# Patient Record
Sex: Female | Born: 1950
Health system: Southern US, Community
[De-identification: ages and names within clinical notes are randomized; demographics above are authoritative.]

## PROBLEM LIST (undated history)

## (undated) DIAGNOSIS — E039 Hypothyroidism, unspecified: Secondary | ICD-10-CM

## (undated) DIAGNOSIS — M549 Dorsalgia, unspecified: Secondary | ICD-10-CM

## (undated) DIAGNOSIS — F329 Major depressive disorder, single episode, unspecified: Secondary | ICD-10-CM

## (undated) DIAGNOSIS — F32A Depression, unspecified: Secondary | ICD-10-CM

## (undated) DIAGNOSIS — K219 Gastro-esophageal reflux disease without esophagitis: Secondary | ICD-10-CM

## (undated) DIAGNOSIS — C801 Malignant (primary) neoplasm, unspecified: Secondary | ICD-10-CM

## (undated) DIAGNOSIS — G8929 Other chronic pain: Secondary | ICD-10-CM

## (undated) DIAGNOSIS — N200 Calculus of kidney: Secondary | ICD-10-CM

## (undated) DIAGNOSIS — M1990 Unspecified osteoarthritis, unspecified site: Secondary | ICD-10-CM

## (undated) DIAGNOSIS — E785 Hyperlipidemia, unspecified: Secondary | ICD-10-CM

## (undated) DIAGNOSIS — N393 Stress incontinence (female) (male): Secondary | ICD-10-CM

## (undated) DIAGNOSIS — E079 Disorder of thyroid, unspecified: Secondary | ICD-10-CM

## (undated) DIAGNOSIS — E559 Vitamin D deficiency, unspecified: Secondary | ICD-10-CM

## (undated) DIAGNOSIS — K429 Umbilical hernia without obstruction or gangrene: Secondary | ICD-10-CM

## (undated) DIAGNOSIS — D649 Anemia, unspecified: Secondary | ICD-10-CM

## (undated) DIAGNOSIS — F419 Anxiety disorder, unspecified: Secondary | ICD-10-CM

## (undated) DIAGNOSIS — M26609 Unspecified temporomandibular joint disorder, unspecified side: Secondary | ICD-10-CM

## (undated) HISTORY — DX: Vitamin D deficiency, unspecified: E55.9

## (undated) HISTORY — PX: SPINE SURGERY: SHX786

## (undated) HISTORY — DX: Other chronic pain: G89.29

## (undated) HISTORY — DX: Dorsalgia, unspecified: M54.9

## (undated) HISTORY — DX: Anemia, unspecified: D64.9

## (undated) HISTORY — PX: HEMORRHOID BANDING: SHX5850

## (undated) HISTORY — PX: LAPAROSCOPIC LYSIS INTESTINAL ADHESIONS: SUR778

## (undated) HISTORY — DX: Hyperlipidemia, unspecified: E78.5

## (undated) HISTORY — DX: Gastro-esophageal reflux disease without esophagitis: K21.9

## (undated) HISTORY — DX: Disorder of thyroid, unspecified: E07.9

## (undated) HISTORY — DX: Calculus of kidney: N20.0

## (undated) HISTORY — PX: TONSILLECTOMY AND ADENOIDECTOMY: SUR1326

## (undated) HISTORY — PX: ABDOMINAL HYSTERECTOMY: SHX81

## (undated) HISTORY — DX: Anxiety disorder, unspecified: F41.9

## (undated) HISTORY — PX: APPENDECTOMY: SHX54

## (undated) HISTORY — PX: HERNIA REPAIR: SHX51

---

## 1998-03-04 ENCOUNTER — Other Ambulatory Visit: Admission: RE | Admit: 1998-03-04 | Discharge: 1998-03-04 | Payer: Self-pay | Admitting: Obstetrics and Gynecology

## 1998-03-13 ENCOUNTER — Ambulatory Visit (HOSPITAL_COMMUNITY): Admission: RE | Admit: 1998-03-13 | Discharge: 1998-03-13 | Payer: Self-pay | Admitting: *Deleted

## 1998-05-01 ENCOUNTER — Emergency Department (HOSPITAL_COMMUNITY): Admission: EM | Admit: 1998-05-01 | Discharge: 1998-05-01 | Payer: Self-pay | Admitting: Family Medicine

## 2000-03-08 ENCOUNTER — Other Ambulatory Visit: Admission: RE | Admit: 2000-03-08 | Discharge: 2000-03-08 | Payer: Self-pay | Admitting: Gynecology

## 2003-01-13 ENCOUNTER — Observation Stay (HOSPITAL_COMMUNITY): Admission: RE | Admit: 2003-01-13 | Discharge: 2003-01-14 | Payer: Self-pay | Admitting: Urology

## 2004-11-13 ENCOUNTER — Ambulatory Visit (HOSPITAL_COMMUNITY): Admission: RE | Admit: 2004-11-13 | Discharge: 2004-11-13 | Payer: Self-pay | Admitting: Urology

## 2005-03-21 ENCOUNTER — Other Ambulatory Visit: Admission: RE | Admit: 2005-03-21 | Discharge: 2005-03-21 | Payer: Self-pay | Admitting: Obstetrics & Gynecology

## 2006-02-08 ENCOUNTER — Ambulatory Visit: Payer: Self-pay | Admitting: Internal Medicine

## 2006-08-15 ENCOUNTER — Ambulatory Visit (HOSPITAL_COMMUNITY): Admission: RE | Admit: 2006-08-15 | Discharge: 2006-08-15 | Payer: Self-pay | Admitting: Internal Medicine

## 2006-10-07 ENCOUNTER — Encounter
Admission: RE | Admit: 2006-10-07 | Discharge: 2006-10-07 | Payer: Self-pay | Admitting: Physical Medicine and Rehabilitation

## 2006-11-10 ENCOUNTER — Ambulatory Visit (HOSPITAL_COMMUNITY): Admission: RE | Admit: 2006-11-10 | Discharge: 2006-11-11 | Payer: Self-pay | Admitting: Surgery

## 2007-01-18 LAB — HM DEXA SCAN

## 2007-10-18 ENCOUNTER — Encounter: Admission: RE | Admit: 2007-10-18 | Discharge: 2007-10-18 | Payer: Self-pay | Admitting: Neurological Surgery

## 2008-01-14 ENCOUNTER — Encounter: Admission: RE | Admit: 2008-01-14 | Discharge: 2008-01-14 | Payer: Self-pay | Admitting: Neurological Surgery

## 2008-03-10 ENCOUNTER — Ambulatory Visit (HOSPITAL_COMMUNITY): Admission: RE | Admit: 2008-03-10 | Discharge: 2008-03-10 | Payer: Self-pay | Admitting: Internal Medicine

## 2008-10-15 ENCOUNTER — Encounter: Admission: RE | Admit: 2008-10-15 | Discharge: 2008-10-15 | Payer: Self-pay | Admitting: Neurological Surgery

## 2008-11-27 ENCOUNTER — Inpatient Hospital Stay (HOSPITAL_COMMUNITY): Admission: RE | Admit: 2008-11-27 | Discharge: 2008-11-28 | Payer: Self-pay | Admitting: Neurological Surgery

## 2008-12-30 ENCOUNTER — Encounter: Admission: RE | Admit: 2008-12-30 | Discharge: 2008-12-30 | Payer: Self-pay | Admitting: Neurological Surgery

## 2009-03-02 ENCOUNTER — Encounter: Admission: RE | Admit: 2009-03-02 | Discharge: 2009-03-02 | Payer: Self-pay | Admitting: Neurological Surgery

## 2009-05-29 ENCOUNTER — Encounter: Admission: RE | Admit: 2009-05-29 | Discharge: 2009-05-29 | Payer: Self-pay | Admitting: Neurological Surgery

## 2009-08-19 ENCOUNTER — Ambulatory Visit (HOSPITAL_COMMUNITY): Admission: RE | Admit: 2009-08-19 | Discharge: 2009-08-19 | Payer: Self-pay | Admitting: Surgery

## 2010-12-22 LAB — COMPREHENSIVE METABOLIC PANEL
ALT: 16 U/L (ref 0–35)
AST: 25 U/L (ref 0–37)
Albumin: 4.3 g/dL (ref 3.5–5.2)
Calcium: 9.9 mg/dL (ref 8.4–10.5)
GFR calc Af Amer: >60 mL/min (ref >60)
Potassium: 4.4 mEq/L (ref 3.5–5.1)
Sodium: 140 mEq/L (ref 135–145)
Total Protein: 6.8 g/dL (ref 6.0–8.3)

## 2010-12-22 LAB — DIFFERENTIAL
Eosinophils Absolute: 0.1 10*3/uL (ref 0.0–0.7)
Eosinophils Relative: 2 % (ref 0–5)
Lymphs Abs: 2.3 10*3/uL (ref 0.7–4.0)
Monocytes Absolute: 0.3 10*3/uL (ref 0.1–1.0)
Monocytes Relative: 6 % (ref 3–12)
Neutrophils Relative %: 47 % (ref 43–77)

## 2010-12-22 LAB — CBC
MCHC: 35.5 g/dL (ref 30.0–36.0)
Platelets: 343 10*3/uL (ref 150–400)
RDW: 12.8 % (ref 11.5–15.5)

## 2010-12-30 LAB — DIFFERENTIAL
Basophils Absolute: 0 10*3/uL (ref 0.0–0.1)
Basophils Relative: 0 % (ref 0–1)
Eosinophils Relative: 2 % (ref 0–5)
Monocytes Absolute: 0.3 10*3/uL (ref 0.1–1.0)
Neutro Abs: 2.1 10*3/uL (ref 1.7–7.7)

## 2010-12-30 LAB — CBC
Hemoglobin: 12.9 g/dL (ref 12.0–15.0)
MCHC: 35.5 g/dL (ref 30.0–36.0)
RDW: 12.6 % (ref 11.5–15.5)

## 2010-12-30 LAB — BASIC METABOLIC PANEL
BUN: 14 mg/dL (ref 6–23)
CO2: 25 mEq/L (ref 19–32)
Calcium: 9.6 mg/dL (ref 8.4–10.5)
Glucose, Bld: 101 mg/dL — ABNORMAL HIGH (ref 70–99)
Sodium: 143 mEq/L (ref 135–145)

## 2010-12-30 LAB — ABO/RH: ABO/RH(D): B POS

## 2011-02-01 NOTE — Op Note (Signed)
NAMEAMREEN, RACZKOWSKI               ACCOUNT NO.:  [id number]   MEDICAL RECORD NO.:  [id number]          PATIENT TYPE:  INP   LOCATION:  3030                         FACILITY:  [facility]   PHYSICIAN:  Tia Alert, MD     DATE OF BIRTH:  Jan 26, 1951   DATE OF PROCEDURE:  11/27/2008  DATE OF DISCHARGE:                               OPERATIVE REPORT   PREOPERATIVE DIAGNOSES:  1. Degenerative disk disease L3-4.  2. Segmental instability L3-4.  3. Spinal stenosis L3-4.  4. Back pain.  5. Leg pain.   POSTOPERATIVE DIAGNOSIS:  1. Degenerative disk disease L3-4.  2. Segmental instability L3-4.  3. Spinal stenosis L3-4.  4. Back pain.  5. Leg pain.   PROCEDURE:  1. Anterior retroperitoneal interbody arthrodesis L3-4 utilizing a 8-      x 45-mm PEEK interbody cage packed with Actifuse putty and Osteocel      plus.  2. Anterior lateral plating L3-4 utilizing the XLP plate.   SURGEON:  Tia Alert, MD   ASSISTANT:  Reinaldo Meeker, MD   ANESTHESIA:  General endotracheal.   COMPLICATIONS:  None apparent.   INDICATIONS FOR PROCEDURE:  Ms. Britta Mccreedy is a very pleasant 60 year old  female who presented with a long history of back pain.  She had some  radiation into her lower extremity.  She had MRIs and plain films  separated by time, which showed progressive degenerative disease with  retrolisthesis at L3-4 with retrolisthesis of L3 and L4 with his spinal  stenosis.  She had severe back pain, which is unrelenting.  I  recommended an anterior retroperitoneal interbody fusion L3-4 with  nonsegmental fixation.  She understood the risks, benefits, expected  outcome, and wished to proceed.   DESCRIPTION OF PROCEDURE:  The patient was taken to the operating room,  and after induction of adequate generalized endotracheal anesthesia, she  was turned into the right lateral decubitus position exposing her left  side.  She was positioned, AP and lateral fluoroscopy were checked and  our  incision points were marked.  She was hooked to intraoperative  neurophysiologic monitoring.  Her left lateral side was prepped with  DuraPrep and draped in usual sterile fashion.  Two incisions were made,  one extreme lateral and one just posterior to this and then finger  dissection was used to go through the 2 fascial layers into the  retroperitoneal space.  The fatty retroperitoneal space was palpable.  The transverse process was palpable as well as the psoas.  I then  dissected with my finger up to the lateral incision and passed our first  dilator down to the psoas musculature; checked this with lateral  fluoroscopy, stimulated this and then was able to pass our K-wire into  the disk space and checked this with AP and lateral fluoroscopy.  This  showed, we were at L3-4 with a K-wire.  We then introduced sequential  dilation testing each one with EMG monitoring to assure there were no  neural structures.  In then proximity to our dilation, we then placed  our final retractor tying this into  position with the arm, checked it  with AP and lateral fluoroscopy, opened it slightly and then checked  with a ball probe stimulating the psoas musculature to assure there were  no neural structures within the operative field.  We then placed our  shim and removed dilators.  I was then able to test once again with ball  probe and incised the disk space and then perform a thorough intradiskal  diskectomy with pituitary rongeurs, Cobb curettes, and angled curettes.  The opposite annulus was opened with the Cobb to allow for distraction  of the disk space.  We then passed our first dilator, it was a 6 mm  dilator and was able to pass it across the opposite annulus slightly.  We then passed our first trial, this was an 8-mm trial.  This seemed to  fit nicely.  Therefore, we chose an 8- x 45-mm PEEK interbody cage and  packed this with Osteocel Plus and with Actifuse putty.  This was then  tapped  utilizing sliders into the interspace at L3-4.  AP and lateral  fluoroscopy was utilized.  Once it was in place, we checked it with AP  and lateral fluoroscopy to assure adequate placement.  We then turned  our attention to the lateral plating.  We used a 6 guide and drilled our  2 holes adjacent to the endplates, placed our plate over the bolts and  locked these into position with locking caps and the anti torque device.  We then checked our final construct with AP and lateral fluoroscopy.  We  had nice distraction of the disk space.  The graft appeared to be well  placed, the plate appeared to be well placed.  I then irrigated with  saline solution containing bacitracin and then removed the retractor  slowly to assure there was no bleeding, I saw none.  Therefore, I closed  the 2 incisions with layers of 0 Vicryl in the fascia, 2-0 Vicryl in the  subcutaneous tissues and 3-0 Vicryl in the subcuticular tissues.  The  skin was closed with Benzoin Steri-Strips.  At the end of the procedure;  all sponge, needle and instrument counts were correct.  The patient was  awakened from general anesthesia and transferred to recovery room in  stable condition.      Tia Alert, MD  Electronically Signed     DSJ/MEDQ  D:  11/27/2008  T:  11/27/2008  Job:  [id number]

## 2011-02-04 NOTE — Op Note (Signed)
   NAME:  Monique Thomas, Monique Thomas                         ACCOUNT NO.:  [id number]   MEDICAL RECORD NO.:  [id number]                   PATIENT TYPE:  AMB   LOCATION:  DAY                                  FACILITY:  [facility]   PHYSICIAN:  Sigmund I. Patsi Sears, M.D.         DATE OF BIRTH:  [birth year]   DATE OF PROCEDURE:  01/13/2003  DATE OF DISCHARGE:                                 OPERATIVE REPORT   SUBJECTIVE:  A 60 year old female, seen in April of 2004, with a history of  recurrent urinary tract infections, and cough, laugh, sneeze incontinence.  She had positive Gaynell Face and positive Q-tip test. The patient is now for  obturator tape sling for stress incontinence.   DESCRIPTION OF PROCEDURE:  The patient was brought to the operating room,  placed on the operating table in dorsal supine position. General  endotracheal anesthesia was introduced. She was replaced in the dorsal  lithotomy position where the pubis was prepped with Betadine solution and  draped in the usual fashion.   The bladder was drained a Foley catheter, and 6 mL of Marcaine 0.25% with  epinephrine 1:200,000 was injected into the periurethral cervical arch  tissue. Following this, a 2 cm midline incision was made, subcutaneous  tissue was dissected over the urethra with blunt and sharp dissection. The  left and right obturator fossa are marked. An incision is made with the stab  wound bilaterally. Using the flat angled device, the mentor OB tape sling is  placed. Cystoscopy shows no interruption of the bladder. There was mild  bleeding from the operative site, and the wound was closed with 3-0 Vicryl  suture and there was no more bleeding noted. The vagina was packed with  packing, and the stab wounds were closed with Dermabond. The patient was  then awakened and taken to the recovery room in good condition.                                               Sigmund I. Patsi Sears, M.D.    SIT/MEDQ  D:  01/13/2003  T:   01/13/2003  Job:  [id number]   cc:   Lucky Cowboy, M.D.  [address], [address]  [place], [place] [address]  Fax: [phone]

## 2011-02-04 NOTE — Op Note (Signed)
NAMEMALORI, MYERS               ACCOUNT NO.:  [id number]   MEDICAL RECORD NO.:  [id number]          PATIENT TYPE:  OIB   LOCATION:  0098                         FACILITY:  [facility]   PHYSICIAN:  Maisie Fus A. Cornett, M.D.DATE OF BIRTH:  10-18-1950   DATE OF PROCEDURE:  11/10/2006  DATE OF DISCHARGE:                               OPERATIVE REPORT   PREOPERATIVE DIAGNOSIS:  Abdominal pain.   POSTOPERATIVE DIAGNOSIS:  Intra-abdominal adhesions.   PROCEDURE:  Laparoscopic lysis of adhesions.   SURGEON:  Harriette Bouillon, MD.   ANESTHESIA:  General endotracheal anesthesia with 10 mL of 0.25%  Sensorcaine with epinephrine.   ESTIMATED BLOOD LOSS:  20 mL.   ASSISTANT:  Dr. Manus Rudd   DRAINS:  None.   SPECIMEN:  None.   INDICATIONS FOR PROCEDURE:  The patient is a 60 year old female with  chronic abdominal pain.  She was referred by her gynecologist for  evaluation for laparoscopy.  She does have persistent right lower  quadrant pain and has had relatively negative workup for this.  She  presents today for diagnostic possible therapeutic laparoscopy after  discussion of the procedure, potential complications and outcomes.   DESCRIPTION OF PROCEDURE:  The patient brought to the operating room and  placed supine.  After induction of general anesthesia, a Foley catheter  was placed.  The abdomen prepped and draped in sterile fashion.  A left  upper quadrant incision was made and a 10 mm OptiVu trocar was used.  We  advanced this under direct vision with the camera but could not advance  into the intra-abdominal cavity.  We elected to go ahead and do a direct  using the [facility] technique.  Incision was made just above her umbilicus  of about 2 cm.  Dissection was carried down to her fascia.  Her fascia  was opened with a scalpel blade.  Kochers were used to grasp both sides  of the fascia, and my finger was placed in the preperitoneal space, and  I was able to push through into the  intra-abdominal cavity without  difficulty.  I felt no adhesions in this area.  Pursestring suture of 0  Vicryl was placed, and a 12-mm Hassan cannula was placed under direct  vision.  Pneumoperitoneum was created with 15 mmHg of CO2 and  laparoscope was placed.  Laparoscopy was performed.  The area where the  OptiVu was inserted did not penetrate the peritoneum.  I placed a 5-mm  port there under direct vision.  The second 5 mL port was placed in the  patient's left lower quadrant.  Upon inspection of the pelvis, there  were omental adhesions to the lower abdominal wall from basically just  below the umbilicus down to the pubic symphysis.  I used sharp  dissection and cautery to take these down carefully to stay up on the  abdominal wall.  There is no bowel involved in this.  All of the  adhesions were taken down completely.  The pelvis was relatively  unremarkable with no signs of scarring, mass, fluid or any other  abnormality was adhesion free.  Right lower quadrant was examined  closely.  The cecum was identified.  There was no appendix.  This was  surgically removed before.  The cecum, right colon appeared normal.  Small bowel appeared normal with no signs of Crohn's disease,  thickening, or inflammatory process.  There is no evidence of abdominal  wall hernia.  She did have some attenuation to her right lower quadrant  abdominal wall from previous appendectomy incision, but no hernia.  The  gallbladder was identified.  It was robin egg blue and normal in  appearance.  All liver and stomach and the remainder of the colon and  small bowel appeared grossly normal without abnormality.  Hemostasis was  excellent.  At this point in time, I did not see any other abnormality  in the abdominal cavity.  We went ahead and removed her ports and the  CO2 escape.  The umbilical port was removed; the CO2 was allowed to  escape.  The port was closed with a pursestring suture of 0 Vicryl.  Monocryl  4-0 used for skin closure.  OF NOTE, SHE HAD A ALLERGIC  REACTION AROUND ALL THE PORT SITES WHERE THE LOCAL WAS INJECTED.  Dermabond was used to close the skin, and all other adhesions were  removed.  The patient was then awoke, taken to recovery in satisfactory  condition.  All final counts of sponge, needle, and instruments were  found be correct at this point in the case.  The patient was taken to  recovery in satisfactory condition.      Thomas A. Cornett, M.D.  Electronically Signed     TAC/MEDQ  D:  11/10/2006  T:  11/10/2006  Job:  [id number]   cc:   Gerrit Friends. Aldona Bar, M.D.  Fax: [phone]

## 2011-09-20 HISTORY — PX: BLADDER SUSPENSION: SHX72

## 2012-04-19 LAB — HM MAMMOGRAPHY

## 2012-06-20 ENCOUNTER — Other Ambulatory Visit: Payer: Self-pay | Admitting: Neurological Surgery

## 2012-06-20 DIAGNOSIS — M545 Low back pain: Secondary | ICD-10-CM

## 2012-06-23 ENCOUNTER — Ambulatory Visit
Admission: RE | Admit: 2012-06-23 | Discharge: 2012-06-23 | Disposition: A | Discharge status: Home / Self Care | Payer: BC Managed Care – PPO | Source: Ambulatory Visit | Attending: Neurological Surgery | Admitting: Neurological Surgery

## 2012-06-23 ENCOUNTER — Other Ambulatory Visit: Payer: Self-pay

## 2012-06-23 DIAGNOSIS — M545 Low back pain: Secondary | ICD-10-CM

## 2012-12-27 ENCOUNTER — Other Ambulatory Visit: Payer: Self-pay | Admitting: Neurosurgery

## 2012-12-27 DIAGNOSIS — IMO0002 Reserved for concepts with insufficient information to code with codable children: Secondary | ICD-10-CM

## 2012-12-27 DIAGNOSIS — M545 Low back pain: Secondary | ICD-10-CM

## 2013-01-07 ENCOUNTER — Ambulatory Visit
Admission: RE | Admit: 2013-01-07 | Discharge: 2013-01-07 | Disposition: A | Discharge status: Home / Self Care | Payer: BC Managed Care – PPO | Source: Ambulatory Visit | Attending: Neurosurgery | Admitting: Neurosurgery

## 2013-01-07 DIAGNOSIS — IMO0002 Reserved for concepts with insufficient information to code with codable children: Secondary | ICD-10-CM

## 2013-01-07 DIAGNOSIS — M545 Low back pain: Secondary | ICD-10-CM

## 2013-03-01 ENCOUNTER — Other Ambulatory Visit: Payer: Self-pay | Admitting: Neurological Surgery

## 2013-03-01 DIAGNOSIS — M549 Dorsalgia, unspecified: Secondary | ICD-10-CM

## 2013-03-04 ENCOUNTER — Ambulatory Visit
Admission: RE | Admit: 2013-03-04 | Discharge: 2013-03-04 | Disposition: A | Discharge status: Home / Self Care | Payer: BC Managed Care – PPO | Source: Ambulatory Visit | Attending: Neurological Surgery | Admitting: Neurological Surgery

## 2013-03-04 ENCOUNTER — Other Ambulatory Visit: Payer: BC Managed Care – PPO

## 2013-03-04 DIAGNOSIS — M549 Dorsalgia, unspecified: Secondary | ICD-10-CM

## 2013-04-03 DIAGNOSIS — M533 Sacrococcygeal disorders, not elsewhere classified: Secondary | ICD-10-CM | POA: Insufficient documentation

## 2013-08-30 ENCOUNTER — Other Ambulatory Visit: Payer: Self-pay | Admitting: Emergency Medicine

## 2013-09-15 ENCOUNTER — Encounter: Payer: Self-pay | Admitting: Internal Medicine

## 2013-09-15 DIAGNOSIS — G8929 Other chronic pain: Secondary | ICD-10-CM | POA: Insufficient documentation

## 2013-09-15 DIAGNOSIS — E559 Vitamin D deficiency, unspecified: Secondary | ICD-10-CM | POA: Insufficient documentation

## 2013-09-15 DIAGNOSIS — F419 Anxiety disorder, unspecified: Secondary | ICD-10-CM

## 2013-09-15 DIAGNOSIS — E039 Hypothyroidism, unspecified: Secondary | ICD-10-CM | POA: Insufficient documentation

## 2013-09-15 DIAGNOSIS — D649 Anemia, unspecified: Secondary | ICD-10-CM | POA: Insufficient documentation

## 2013-09-15 DIAGNOSIS — E785 Hyperlipidemia, unspecified: Secondary | ICD-10-CM

## 2013-09-15 DIAGNOSIS — K219 Gastro-esophageal reflux disease without esophagitis: Secondary | ICD-10-CM

## 2013-09-15 DIAGNOSIS — E079 Disorder of thyroid, unspecified: Secondary | ICD-10-CM

## 2013-09-19 DIAGNOSIS — Z87442 Personal history of urinary calculi: Secondary | ICD-10-CM

## 2013-09-19 HISTORY — DX: Personal history of urinary calculi: Z87.442

## 2013-09-19 HISTORY — PX: EXTRACORPOREAL SHOCK WAVE LITHOTRIPSY: SHX1557

## 2013-09-23 ENCOUNTER — Ambulatory Visit (INDEPENDENT_AMBULATORY_CARE_PROVIDER_SITE_OTHER): Payer: BC Managed Care – PPO | Admitting: Emergency Medicine

## 2013-09-23 ENCOUNTER — Encounter: Payer: Self-pay | Admitting: Emergency Medicine

## 2013-09-23 VITALS — BP 118/72 | HR 68 | Temp 98.0°F | Resp 18 | Ht 61.0 in | Wt 135.0 lb

## 2013-09-23 DIAGNOSIS — E039 Hypothyroidism, unspecified: Secondary | ICD-10-CM

## 2013-09-23 DIAGNOSIS — R51 Headache: Secondary | ICD-10-CM

## 2013-09-23 DIAGNOSIS — R5381 Other malaise: Secondary | ICD-10-CM

## 2013-09-23 DIAGNOSIS — N3941 Urge incontinence: Secondary | ICD-10-CM

## 2013-09-23 DIAGNOSIS — J309 Allergic rhinitis, unspecified: Secondary | ICD-10-CM

## 2013-09-23 DIAGNOSIS — R5383 Other fatigue: Secondary | ICD-10-CM

## 2013-09-23 DIAGNOSIS — M26629 Arthralgia of temporomandibular joint, unspecified side: Secondary | ICD-10-CM

## 2013-09-23 DIAGNOSIS — Z0000 Encounter for general adult medical examination without abnormal findings: Secondary | ICD-10-CM

## 2013-09-23 DIAGNOSIS — R8290 Unspecified abnormal findings in urine: Secondary | ICD-10-CM

## 2013-09-23 DIAGNOSIS — Z1212 Encounter for screening for malignant neoplasm of rectum: Secondary | ICD-10-CM

## 2013-09-23 DIAGNOSIS — Z79899 Other long term (current) drug therapy: Secondary | ICD-10-CM

## 2013-09-23 DIAGNOSIS — Z23 Encounter for immunization: Secondary | ICD-10-CM

## 2013-09-23 DIAGNOSIS — E559 Vitamin D deficiency, unspecified: Secondary | ICD-10-CM

## 2013-09-23 DIAGNOSIS — Z111 Encounter for screening for respiratory tuberculosis: Secondary | ICD-10-CM

## 2013-09-23 DIAGNOSIS — N3000 Acute cystitis without hematuria: Secondary | ICD-10-CM

## 2013-09-23 LAB — CBC WITH DIFFERENTIAL/PLATELET
BASOS ABS: 0 10*3/uL (ref 0.0–0.1)
BASOS PCT: 1 % (ref 0–1)
EOS ABS: 0.1 10*3/uL (ref 0.0–0.7)
EOS PCT: 2 % (ref 0–5)
HEMATOCRIT: 41.3 % (ref 36.0–46.0)
HEMOGLOBIN: 14.2 g/dL (ref 12.0–15.0)
Lymphocytes Relative: 38 % (ref 12–46)
Lymphs Abs: 2.1 10*3/uL (ref 0.7–4.0)
MCH: 30.7 pg (ref 26.0–34.0)
MCHC: 34.4 g/dL (ref 30.0–36.0)
MCV: 89.4 fL (ref 78.0–100.0)
MONO ABS: 0.5 10*3/uL (ref 0.1–1.0)
MONOS PCT: 8 % (ref 3–12)
Neutro Abs: 3 10*3/uL (ref 1.7–7.7)
Neutrophils Relative %: 51 % (ref 43–77)
Platelets: 387 10*3/uL (ref 150–400)
RBC: 4.62 MIL/uL (ref 3.87–5.11)
RDW: 14.2 % (ref 11.5–15.5)
WBC: 5.7 10*3/uL (ref 4.0–10.5)

## 2013-09-23 LAB — BASIC METABOLIC PANEL WITH GFR
BUN: 12 mg/dL (ref 6–23)
CALCIUM: 9.8 mg/dL (ref 8.4–10.5)
CO2: 29 mEq/L (ref 19–32)
Chloride: 103 mEq/L (ref 96–112)
Creat: 0.67 mg/dL (ref 0.50–1.10)
GLUCOSE: 90 mg/dL (ref 70–99)
POTASSIUM: 3.9 meq/L (ref 3.5–5.3)
Sodium: 139 mEq/L (ref 135–145)

## 2013-09-23 LAB — LIPID PANEL
CHOL/HDL RATIO: 2.3 ratio
CHOLESTEROL: 202 mg/dL — AB (ref 0–200)
HDL: 87 mg/dL (ref >39)
LDL Cholesterol: 95 mg/dL (ref 0–99)
Triglycerides: 98 mg/dL (ref <150)
VLDL: 20 mg/dL (ref 0–40)

## 2013-09-23 LAB — HEPATIC FUNCTION PANEL
ALBUMIN: 4.9 g/dL (ref 3.5–5.2)
ALK PHOS: 59 U/L (ref 39–117)
ALT: 50 U/L — ABNORMAL HIGH (ref 0–35)
AST: 42 U/L — AB (ref 0–37)
BILIRUBIN DIRECT: 0.1 mg/dL (ref 0.0–0.3)
BILIRUBIN TOTAL: 0.6 mg/dL (ref 0.3–1.2)
Indirect Bilirubin: 0.5 mg/dL (ref 0.0–0.9)
Total Protein: 7.5 g/dL (ref 6.0–8.3)

## 2013-09-23 LAB — VITAMIN B12: Vitamin B-12: 964 pg/mL — ABNORMAL HIGH (ref 211–911)

## 2013-09-23 LAB — HEMOGLOBIN A1C
Hgb A1c MFr Bld: 5.5 % (ref <5.7)
Mean Plasma Glucose: 111 mg/dL (ref <117)

## 2013-09-23 LAB — TSH: TSH: 9.807 u[IU]/mL — ABNORMAL HIGH (ref 0.350–4.500)

## 2013-09-23 LAB — MAGNESIUM: MAGNESIUM: 1.8 mg/dL (ref 1.5–2.5)

## 2013-09-23 MED ORDER — PREDNISONE 10 MG PO TABS: ORAL_TABLET | ORAL | Status: DC

## 2013-09-23 NOTE — Progress Notes (Signed)
Subjective:    Patient ID: Monique Thomas, female    DOB: Oct 08, 1950, 63 y.o.   MRN: [id number]  HPI Comments: 63 yo pleasant WF for CPE and presents for 3 month F/U for labile HTN, Cholesterol, Pre-Dm, D. Deficient, hypothyroid, anemia. She has been eating healthier with husbands new diagnosis of DM. She is down  7# since last CPE. She is increasing activity but does not perform cardio routinely with chronic back pain. LAST LABS T 198 TG 136 H 75 L 96 MAG 1.9  A1C 5.3 INSULIN 10 D 64  Urine has increased odor over last couple of weeks. She is trying to increase water and is eating healthier. She notes + thirst increased.   SInus pressure x 3 days, ears full, no chest congestion. She notes she has pain in temples and across forehead. She also has popping in jaws. She denies any trauma but notes she is clenching teeth with increased stress.  She has noticed a increase in fatigue with stress with husbands recent health issues and her chronic back pain. She notes recent steroid injections into spine by pain management with minimal relief. She notes no increased pain and has tried to start doing back PT at home.  Anxiety Symptoms include nervous/anxious behavior.      Current Outpatient Prescriptions on File Prior to Visit  Medication Sig Dispense Refill  . cholecalciferol (VITAMIN D) 1000 UNITS tablet Take 1,000 Units by mouth daily. TAKE 3 qd      . levothyroxine (SYNTHROID, LEVOTHROID) 88 MCG tablet Take 88 mcg by mouth daily before breakfast. Brand name only 1 QD      . Magnesium 250 MG TABS Take by mouth.      . Multiple Vitamins-Minerals (MULTIVITAMIN WITH MINERALS) tablet Take 1 tablet by mouth daily.      . vitamin B-12 (CYANOCOBALAMIN) 100 MCG tablet Take 100 mcg by mouth daily.      . fluticasone (FLONASE) 50 MCG/ACT nasal spray Place 1 spray into both nostrils daily.       No current facility-administered medications on file prior to visit.   ALLERGIES Topamax; Entex lq; and  Fiorinal  Past Medical History  Diagnosis Date  . Hyperlipidemia   . Thyroid disease   . GERD (gastroesophageal reflux disease)   . Anemia   . Anxiety   . Back pain, chronic   . Unspecified vitamin D deficiency    Past Surgical History  Procedure Laterality Date  . Abdominal hysterectomy    . Cesarean section    . Hernia repair    . Appendectomy    . Tonsillectomy and adenoidectomy    . Spine surgery      lumbar    History  Substance Use Topics  . Smoking status: Never Smoker   . Smokeless tobacco: Not on file  . Alcohol Use: No   Family History  Problem Relation Age of Onset  . Heart disease Mother   . Hyperlipidemia Mother   . Thyroid disease Mother   . Asthma Sister   . Cancer Maternal Grandmother     ovarian     Review of Systems  Constitutional: Positive for fatigue.  HENT: Positive for congestion, ear discharge and sinus pressure.   Eyes:       MCFARLAND 9/14 WNL  Gastrointestinal:       PETERS 6/08 POLYPS DUE 2018  Genitourinary: Positive for urgency.       GYN- WEIN PAP 10/14 WNL 1ST PREG-18 1ST MENSES- 9  ABN PAP @27  GU- TANNEBAUM  Musculoskeletal: Positive for back pain.       JONES/ HARKINS F/U 1/15  Skin:       TAFEEN WNL 2014  Psychiatric/Behavioral: The patient is nervous/anxious.   All other systems reviewed and are negative.   BP 118/72  Pulse 68  Temp(Src) 98 F (36.7 C) (Temporal)  Resp 18  Ht 5\' 1"  (1.549 m)  Wt 135 lb (61.236 kg)  BMI 25.52 kg/m2     Objective:   Physical Exam  Nursing note and vitals reviewed. Constitutional: She is oriented to person, place, and time. She appears well-developed and well-nourished. No distress.  HENT:  Head: Normocephalic and atraumatic.  Right Ear: External ear normal.  Left Ear: External ear normal.  Nose: Nose normal.  Mouth/Throat: Oropharynx is clear and moist. No oropharyngeal exudate.  + CLICKING WITH BIL TMJs Cloudy TMs  Eyes: Conjunctivae and EOM are normal. Pupils are  equal, round, and reactive to light. Right eye exhibits no discharge. Left eye exhibits no discharge. No scleral icterus.  Neck: Normal range of motion. Neck supple. No JVD present. No tracheal deviation present. No thyromegaly present.  Cardiovascular: Normal rate, regular rhythm, normal heart sounds and intact distal pulses.   Pulmonary/Chest: Effort normal and breath sounds normal.  Abdominal: Soft. Bowel sounds are normal. She exhibits no distension and no mass. There is no tenderness. There is no rebound and no guarding.  Genitourinary:  Def GYN  Musculoskeletal: Normal range of motion. She exhibits no edema and no tenderness.  + discomfort with change of position from sitting to lying with lumbar region  Lymphadenopathy:    She has no cervical adenopathy.  Neurological: She is alert and oriented to person, place, and time. She has normal reflexes. No cranial nerve deficit. She exhibits normal muscle tone. Coordination normal.  Skin: Skin is warm and dry. No rash noted. No erythema. No pallor.  Psychiatric: She has a normal mood and affect. Her behavior is normal. Judgment and thought content normal.     EKG WNL- NSCSPT      Assessment & Plan:  1. CPE and 1.  3 month F/U for labile HTN, Cholesterol, Pre-Dm, D. Deficient, Hypothyroid, Anemia. Needs healthy diet, cardio QD and obtain healthy weight. Check Labs, Check BP if >130/80 call office 2. Urine urgency/ incontinence/ malodor- Check labs, Pelvic floor rehab if labs negative or if recurrent UTI. 3. Fatigue- check labs, increase activity and H2O 4. Stress vs HA vs TMJ vs Allergic rhinitis- Allegra OTC, increase H2o, allergy hygiene explained. TMJ hygiene explained, w/c if no relief may need dental evaluation. Decrease stress. 5. Chronic back pain- continue Ortho/ pain mgmt AD

## 2013-09-23 NOTE — Patient Instructions (Signed)
Allergic Rhinitis Allergic rhinitis is when the mucous membranes in the nose respond to allergens. Allergens are particles in the air that cause your body to have an allergic reaction. This causes you to release allergic antibodies. Through a chain of events, these eventually cause you to release histamine into the blood stream (hence the use of antihistamines). Although meant to be protective to the body, it is this release that causes your discomfort, such as frequent sneezing, congestion and an itchy runny nose.  CAUSES  The pollen allergens may come from grasses, trees, and weeds. This is seasonal allergic rhinitis, or "hay fever." Other allergens cause year-round allergic rhinitis (perennial allergic rhinitis) such as house dust mite allergen, pet dander and mold spores.  SYMPTOMS   Nasal stuffiness (congestion).  Runny, itchy nose with sneezing and tearing of the eyes.  There is often an itching of the mouth, eyes and ears. It cannot be cured, but it can be controlled with medications. DIAGNOSIS  If you are unable to determine the offending allergen, skin or blood testing may find it. TREATMENT   Avoid the allergen.  Medications and allergy shots (immunotherapy) can help.  Hay fever may often be treated with antihistamines in pill or nasal spray forms. Antihistamines block the effects of histamine. There are over-the-counter medicines that may help with nasal congestion and swelling around the eyes. Check with your caregiver before taking or giving this medicine. If the treatment above does not work, there are many new medications your caregiver can prescribe. Stronger medications may be used if initial measures are ineffective. Desensitizing injections can be used if medications and avoidance fails. Desensitization is when a patient is given ongoing shots until the body becomes less sensitive to the allergen. Make sure you follow up with your caregiver if problems continue. SEEK MEDICAL  CARE IF:   You develop fever (more than 100.5 F (38.1 C).  You develop a cough that does not stop easily (persistent).  You have shortness of breath.  You start wheezing.  Symptoms interfere with normal daily activities. Document Released: 05/31/2001 Document Revised: 11/28/2011 Document Reviewed: 12/10/2008 [facility] Patient Information 2014 [place]. Urinary Incontinence Urinary incontinence is the involuntary loss of urine from your bladder. CAUSES  There are many causes of urinary incontinence. They include:  Medicines.  Infections.  Prostatic enlargement, leading to overflow of urine from your bladder.  Surgery.  Neurological diseases.  Emotional factors. SIGNS AND SYMPTOMS Urinary Incontinence can be divided into four types: 1. Urge incontinence Urge incontinence is the involuntary loss of urine before you have the opportunity to go to the bathroom. There is an sudden urge to void but not enough time to reach a bathroom. 2. Stress incontinence Stress incontinence is the sudden loss of urine with any activity that forces urine to pass. It is commonly caused by anatomical changes to the pelvis and sphincter areas of your body. 3. Overflow incontinence Overflow incontinence is the loss of urine from an obstructed opening to your bladder. This results in a backup of urine and a resultant buildup of pressure within the bladder. When the pressure within the bladder exceeds the closing pressure of the sphincter, the urine overflows, which causes incontinence, similar to water overflowing a dam. 4. Total incontinence Total incontinence is the loss of urine as a result of the inability to store urine within your bladder. DIAGNOSIS  Evaluating the cause of incontinence may require:  A thorough and complete medical and obstetric history.  A complete physical exam.  Laboratory tests such as a urine culture and sensitivities. When additional tests are indicated, they can  include:  An ultrasound exam.  Kidney and bladder X-rays.  Cystoscopy. This is an exam of the bladder using a narrow scope.  Urodynamic testing to test the nerve function to the bladder and sphincter areas. TREATMENT  Treatment for urinary incontinence depends on the cause:  For urge incontinence caused by a bacterial infection, antibiotics will be prescribed. If the urge incontinence is related to medicines you take, your health care provider may have you change the medicine.  For stress incontinence, surgery to re-establish anatomical support to the bladder or sphincter, or both, will often correct the condition.  For overflow incontinence caused by an enlarged prostate, an operation to open the channel through the enlarged prostate will allow the flow of urine out of the bladder. In women with fibroids, a hysterectomy may be recommended.  For total incontinence, surgery on your urinary sphincter may help. An artificial urinary sphincter (an inflatable cuff placed around the urethra) may be required. In women who have developed a hole-like passage between their bladder and vagina (vesicovaginal fistula), surgery to close the fistula often is required. HOME CARE INSTRUCTIONS  Normal daily hygiene and the use of pads or adult diapers that are changed regularly will help prevent odors and skin damage.  Avoid caffeine. It can overstimulate your bladder.  Use the bathroom regularly. Try about every 2 3 hours to go to the bathroom, even if you do not feel the need to do so. Take time to empty your bladder completely. After urinating, wait a minute. Then try to urinate again.  For causes involving nerve dysfunction, keep a log of the medicines you take and a journal of the times you go to the bathroom. SEEK MEDICAL CARE IF:  You experience worsening of pain instead of improvement in pain after your procedure.  Your incontinence becomes worse instead of better. SEE IMMEDIATE MEDICAL CARE  IF:  You experience fever or shaking chills.  You are unable to pass your urine.  You have redness spreading into your groin or down into your thighs. MAKE SURE YOU:   Understand these instructions.   Will watch your condition.  Will get help right away if you are not doing well or get worse. Document Released: 10/13/2004 Document Revised: 05/08/2013 Document Reviewed: 02/12/2013 [facility] Patient Information 2014 [place]. Asymptomatic Bacteriuria, Female Asymptomatic bacteriuria is a significant number of bacteria in your urine that occur without the usual symptoms of burning or frequent urination. The following conditions increase risk of asymptomatic bacteriuria:  Diabetes mellitus.  Advanced age.  Pregnancy in the first trimester.  Kidney stones.  Kidney transplants.  Leaky kidney tube valve in young children (reflux). Treatment for asymptomatic bacteriuria is not required in most people and can lead to other problems such as yeast overgrowth and development of resistant bacteria. However, some people, such as pregnant women, do need treatment to prevent kidney infection. Asymptomatic bacteriuria in pregnancy is also associated with fetal growth restriction, premature labor, and newborn death. HOME CARE INSTRUCTIONS Monitor your bacteriuria for any changes. The following actions may help to alleviate any discomfort you are experiencing:  Drink enough water and fluids to keep your urine clear or pale yellow. Go to the bathroom more frequently to keep your bladder empty.  Keep the area around your vagina and rectum clean. Wipe yourself from front to back after urinating. SEEK IMMEDIATE MEDICAL CARE IF:  You develop signs of  an infection such asburning with urination, frequency of voiding, back pain, or fever.  You have blood in the urine.  You develop a fever. Document Released: 09/05/2005 Document Revised: 05/08/2013 Document Reviewed: 02/25/2013 [facility]  Patient Information 2014 [place].

## 2013-09-24 LAB — URINALYSIS, MICROSCOPIC ONLY
CASTS: NONE SEEN
Crystals: NONE SEEN

## 2013-09-24 LAB — URINALYSIS, ROUTINE W REFLEX MICROSCOPIC
Bilirubin Urine: NEGATIVE
GLUCOSE, UA: NEGATIVE mg/dL
HGB URINE DIPSTICK: NEGATIVE
Ketones, ur: NEGATIVE mg/dL
LEUKOCYTES UA: NEGATIVE
Nitrite: POSITIVE — AB
PROTEIN: NEGATIVE mg/dL
Specific Gravity, Urine: 1.019 (ref 1.005–1.030)
UROBILINOGEN UA: 0.2 mg/dL (ref 0.0–1.0)
pH: 5 (ref 5.0–8.0)

## 2013-09-24 LAB — MICROALBUMIN / CREATININE URINE RATIO
Creatinine, Urine: 205.3 mg/dL
MICROALB/CREAT RATIO: 4.2 mg/g (ref 0.0–30.0)
Microalb, Ur: 0.86 mg/dL (ref 0.00–1.89)

## 2013-09-24 LAB — VITAMIN D 25 HYDROXY (VIT D DEFICIENCY, FRACTURES): VIT D 25 HYDROXY: 66 ng/mL (ref 30–89)

## 2013-09-24 LAB — INSULIN, FASTING: Insulin fasting, serum: 11 u[IU]/mL (ref 3–28)

## 2013-09-25 ENCOUNTER — Encounter: Payer: Self-pay | Admitting: Emergency Medicine

## 2013-09-25 LAB — URINE CULTURE: Colony Count: >=100000

## 2013-09-26 ENCOUNTER — Other Ambulatory Visit: Payer: Self-pay | Admitting: Emergency Medicine

## 2013-09-26 LAB — TB SKIN TEST
Induration: 0 mm
TB SKIN TEST: NEGATIVE

## 2013-09-26 MED ORDER — ALPRAZOLAM 1 MG PO TABS: ORAL_TABLET | ORAL | Status: DC

## 2013-09-27 ENCOUNTER — Other Ambulatory Visit: Payer: Self-pay | Admitting: Internal Medicine

## 2013-09-27 MED ORDER — CIPROFLOXACIN HCL 500 MG PO TABS: 500.0000 mg | ORAL_TABLET | Freq: Two times a day (BID) | ORAL | Status: AC

## 2013-10-04 ENCOUNTER — Encounter: Payer: Self-pay | Admitting: Internal Medicine

## 2013-10-09 ENCOUNTER — Encounter: Payer: Self-pay | Admitting: Physician Assistant

## 2013-10-09 ENCOUNTER — Ambulatory Visit (INDEPENDENT_AMBULATORY_CARE_PROVIDER_SITE_OTHER): Payer: BC Managed Care – PPO | Admitting: Physician Assistant

## 2013-10-09 VITALS — BP 118/64 | HR 84 | Temp 98.1°F | Resp 16 | Wt 138.0 lb

## 2013-10-09 DIAGNOSIS — M26629 Arthralgia of temporomandibular joint, unspecified side: Secondary | ICD-10-CM

## 2013-10-09 DIAGNOSIS — R51 Headache: Secondary | ICD-10-CM

## 2013-10-09 DIAGNOSIS — J329 Chronic sinusitis, unspecified: Secondary | ICD-10-CM

## 2013-10-09 MED ORDER — CYCLOBENZAPRINE HCL 10 MG PO TABS: ORAL_TABLET | ORAL | Status: DC

## 2013-10-09 MED ORDER — FLUCONAZOLE 150 MG PO TABS: 150.0000 mg | ORAL_TABLET | Freq: Every day | ORAL | Status: DC

## 2013-10-09 MED ORDER — AMOXICILLIN-POT CLAVULANATE 875-125 MG PO TABS: 1.0000 | ORAL_TABLET | Freq: Two times a day (BID) | ORAL | Status: AC

## 2013-10-09 NOTE — Patient Instructions (Signed)
What is the TMJ? The temporomandibular (tem-PUH-ro-man-DIB-yoo-ler) joint, or the TMJ, connects the upper and lower jawbones. This joint allows the jaw to open wide and move back and forth when you chew, talk, or yawn.There are also several muscles that help this joint move. There can be muscle tightness and pain in the muscle that can cause several symptoms.  What causes TMJ pain? There are many causes of TMJ pain. Repeated chewing (for example, chewing gum) and clenching your teeth can cause pain in the joint. Some TMJ pain has no obvious cause. What can I do to ease the pain? There are many things you can do to help your pain get better. When you have pain:  Eat soft foods and stay away from chewy foods (for example, taffy) Try to use both sides of your mouth to chew Don't chew gum Don't open your mouth wide (for example, during yawning or singing) Don't bite your cheeks or fingernails massage Lower your amount of stress and worry Applying a warm, damp washcloth to the joint may help. Over-the-counter pain medicines such as ibuprofen (one brand: Advil) or acetaminophen (one brand: Tylenol) might also help. Do not use these medicines if you are allergic to them or if your doctor told you not to use them. How can I stop the pain from coming back? When your pain is better, you can do these exercises to make your muscles stronger and to keep the pain from coming back:  Resisted mouth opening: Place your thumb or two fingers under your chin and open your mouth slowly, pushing up lightly on your chin with your thumb. Hold for three to six seconds. Close your mouth slowly. Resisted mouth closing: Place your thumbs under your chin and your two index fingers on the ridge between your mouth and the bottom of your chin. Push down lightly on your chin as you close your mouth. Tongue up: Slowly open and close your mouth while keeping the tongue touching the roof of the mouth. Side-to-side jaw movement:  Place an object about one fourth of an inch thick (for example, two tongue depressors) between your front teeth. Slowly move your jaw from side to side. Increase the thickness of the object as the exercise becomes easier Forward jaw movement: Place an object about one fourth of an inch thick between your front teeth and move the bottom jaw forward so that the bottom teeth are in front of the top teeth. Increase the thickness of the object as the exercise becomes easier. These exercises should not be painful. If it hurts to do these exercises, stop doing them and talk to your family doctor.

## 2013-10-09 NOTE — Progress Notes (Signed)
Subjective:    Patient ID: Monique Thomas, female    DOB: February 24, 1951, 63 y.o.   MRN: [id number]  Headache  This is a new problem. Episode onset: Jan. 1st.  The problem occurs constantly. The problem has been gradually worsening. The pain is located in the bilateral, frontal, occipital and parietal region. The pain does not radiate. The quality of the pain is described as sharp and aching. The pain is moderate. Associated symptoms include drainage, neck pain and sinus pressure. Pertinent negatives include no abdominal pain, abnormal behavior, anorexia, back pain, blurred vision, coughing, dizziness, ear pain, eye pain, eye redness, eye watering, facial sweating, fever, hearing loss, insomnia, loss of balance, muscle aches, nausea, numbness, phonophobia, photophobia, rhinorrhea, scalp tenderness, seizures, sore throat, swollen glands, tingling, tinnitus, visual change, vomiting, weakness or weight loss. Exacerbated by: Worse in the morning and gets better thoughout the day. Treatments tried: Prednsione. The treatment provided no relief. Her past medical history is significant for TMJ.    Current Outpatient Prescriptions on File Prior to Visit  Medication Sig Dispense Refill  . ALPRAZolam (XANAX) 1 MG tablet TAKE 1 TABLET BY MOUTH TWICE DAILY AS NEEDED  60 tablet  0  . cholecalciferol (VITAMIN D) 1000 UNITS tablet Take 1,000 Units by mouth daily. TAKE 3 qd      . estradiol (ESTRACE) 0.5 MG tablet Take 0.5 mg by mouth daily.      . fluticasone (FLONASE) 50 MCG/ACT nasal spray Place 1 spray into both nostrils daily.      Marland Kitchen levothyroxine (SYNTHROID, LEVOTHROID) 88 MCG tablet Take 88 mcg by mouth daily before breakfast. Brand name only 1 QD      . Magnesium 250 MG TABS Take by mouth.      . Multiple Vitamins-Minerals (MULTIVITAMIN WITH MINERALS) tablet Take 1 tablet by mouth daily.      . predniSONE (DELTASONE) 10 MG tablet 1 po TID x 3 days, 1 PO BID x 3 days, 1 po QD x 5 days  20 tablet  0  . vitamin  B-12 (CYANOCOBALAMIN) 100 MCG tablet Take 100 mcg by mouth daily.       No current facility-administered medications on file prior to visit.   Past Medical History  Diagnosis Date  . Hyperlipidemia   . Thyroid disease   . GERD (gastroesophageal reflux disease)   . Anemia   . Anxiety   . Back pain, chronic   . Unspecified vitamin D deficiency     Review of Systems  Constitutional: Negative for fever and weight loss.  HENT: Positive for sinus pressure. Negative for ear pain, hearing loss, rhinorrhea, sore throat and tinnitus.   Eyes: Negative for blurred vision, photophobia, pain and redness.  Respiratory: Negative for cough.   Gastrointestinal: Negative for nausea, vomiting, abdominal pain and anorexia.  Musculoskeletal: Positive for neck pain. Negative for back pain.  Neurological: Positive for headaches. Negative for dizziness, tingling, seizures, weakness, numbness and loss of balance.  Psychiatric/Behavioral: The patient does not have insomnia.        Objective:   Physical Exam  Constitutional: She appears well-developed and well-nourished.  HENT:  Head: Normocephalic and atraumatic.  Right Ear: External ear normal.  Nose: Right sinus exhibits maxillary sinus tenderness. Right sinus exhibits no frontal sinus tenderness. Left sinus exhibits maxillary sinus tenderness. Left sinus exhibits no frontal sinus tenderness.  + TMJ tenderness  Eyes: Conjunctivae and EOM are normal.  Neck: Normal range of motion. Neck supple.  Cardiovascular: Normal rate, regular  rhythm, normal heart sounds and intact distal pulses.   Pulmonary/Chest: Effort normal and breath sounds normal. No respiratory distress. She has no wheezes.  Abdominal: Soft. Bowel sounds are normal.  Lymphadenopathy:    She has no cervical adenopathy.  Skin: Skin is warm and dry.       Assessment & Plan:  TMJ vs Sinusitis-  Normal neuro exam  Info given, flexeril at night, get mouth guard  Augmentin for possible  sinus infection though TMJ more likely

## 2013-10-10 ENCOUNTER — Ambulatory Visit: Payer: Self-pay | Admitting: Physician Assistant

## 2013-10-24 ENCOUNTER — Other Ambulatory Visit: Payer: Self-pay | Admitting: Emergency Medicine

## 2013-10-24 DIAGNOSIS — E079 Disorder of thyroid, unspecified: Secondary | ICD-10-CM

## 2013-10-24 DIAGNOSIS — R899 Unspecified abnormal finding in specimens from other organs, systems and tissues: Secondary | ICD-10-CM

## 2013-10-24 DIAGNOSIS — N390 Urinary tract infection, site not specified: Secondary | ICD-10-CM

## 2013-10-28 ENCOUNTER — Other Ambulatory Visit: Payer: BC Managed Care – PPO

## 2013-10-28 DIAGNOSIS — N390 Urinary tract infection, site not specified: Secondary | ICD-10-CM

## 2013-10-28 DIAGNOSIS — E079 Disorder of thyroid, unspecified: Secondary | ICD-10-CM

## 2013-10-28 DIAGNOSIS — N3000 Acute cystitis without hematuria: Secondary | ICD-10-CM

## 2013-10-28 DIAGNOSIS — R899 Unspecified abnormal finding in specimens from other organs, systems and tissues: Secondary | ICD-10-CM

## 2013-10-28 DIAGNOSIS — I10 Essential (primary) hypertension: Secondary | ICD-10-CM

## 2013-10-28 LAB — TSH: TSH: 0.714 u[IU]/mL (ref 0.350–4.500)

## 2013-10-28 LAB — HEPATIC FUNCTION PANEL
ALBUMIN: 4.2 g/dL (ref 3.5–5.2)
ALT: 19 U/L (ref 0–35)
AST: 20 U/L (ref 0–37)
Alkaline Phosphatase: 49 U/L (ref 39–117)
BILIRUBIN TOTAL: 0.4 mg/dL (ref 0.2–1.2)
Bilirubin, Direct: 0.1 mg/dL (ref 0.0–0.3)
Indirect Bilirubin: 0.3 mg/dL (ref 0.2–1.2)
Total Protein: 6.6 g/dL (ref 6.0–8.3)

## 2013-10-29 LAB — URINALYSIS, ROUTINE W REFLEX MICROSCOPIC
Bilirubin Urine: NEGATIVE
Glucose, UA: NEGATIVE mg/dL
HGB URINE DIPSTICK: NEGATIVE
Ketones, ur: NEGATIVE mg/dL
Nitrite: POSITIVE — AB
Protein, ur: NEGATIVE mg/dL
Specific Gravity, Urine: 1.013 (ref 1.005–1.030)
UROBILINOGEN UA: 0.2 mg/dL (ref 0.0–1.0)
pH: 6.5 (ref 5.0–8.0)

## 2013-10-29 LAB — URINALYSIS, MICROSCOPIC ONLY
Casts: NONE SEEN
Crystals: NONE SEEN

## 2013-10-30 LAB — URINE CULTURE

## 2013-10-31 ENCOUNTER — Other Ambulatory Visit: Payer: Self-pay | Admitting: Emergency Medicine

## 2013-10-31 MED ORDER — SULFAMETHOXAZOLE-TMP DS 800-160 MG PO TABS: 1.0000 | ORAL_TABLET | Freq: Two times a day (BID) | ORAL | Status: DC

## 2013-11-07 ENCOUNTER — Other Ambulatory Visit (INDEPENDENT_AMBULATORY_CARE_PROVIDER_SITE_OTHER): Payer: BC Managed Care – PPO

## 2013-11-07 DIAGNOSIS — R51 Headache: Secondary | ICD-10-CM

## 2013-11-07 DIAGNOSIS — Z0000 Encounter for general adult medical examination without abnormal findings: Secondary | ICD-10-CM

## 2013-11-07 DIAGNOSIS — R5383 Other fatigue: Secondary | ICD-10-CM

## 2013-11-07 DIAGNOSIS — J309 Allergic rhinitis, unspecified: Secondary | ICD-10-CM

## 2013-11-07 DIAGNOSIS — R8290 Unspecified abnormal findings in urine: Secondary | ICD-10-CM

## 2013-11-07 DIAGNOSIS — M26629 Arthralgia of temporomandibular joint, unspecified side: Secondary | ICD-10-CM

## 2013-11-07 DIAGNOSIS — Z1212 Encounter for screening for malignant neoplasm of rectum: Secondary | ICD-10-CM

## 2013-11-07 DIAGNOSIS — Z23 Encounter for immunization: Secondary | ICD-10-CM

## 2013-11-07 DIAGNOSIS — R5381 Other malaise: Secondary | ICD-10-CM

## 2013-11-07 DIAGNOSIS — N3941 Urge incontinence: Secondary | ICD-10-CM

## 2013-11-07 DIAGNOSIS — E039 Hypothyroidism, unspecified: Secondary | ICD-10-CM

## 2013-11-07 DIAGNOSIS — Z111 Encounter for screening for respiratory tuberculosis: Secondary | ICD-10-CM

## 2013-11-07 LAB — POC HEMOCCULT BLD/STL (HOME/3-CARD/SCREEN)
Card #2 Fecal Occult Blod, POC: NEGATIVE
FECAL OCCULT BLD: NEGATIVE
Fecal Occult Blood, POC: NEGATIVE

## 2013-12-04 ENCOUNTER — Other Ambulatory Visit: Payer: Self-pay | Admitting: Neurological Surgery

## 2013-12-04 DIAGNOSIS — M545 Low back pain, unspecified: Secondary | ICD-10-CM

## 2013-12-04 NOTE — Progress Notes (Signed)
[facility] TO CALL & SCHEDULE  

## 2013-12-11 ENCOUNTER — Other Ambulatory Visit: Payer: BC Managed Care – PPO

## 2013-12-11 DIAGNOSIS — N390 Urinary tract infection, site not specified: Secondary | ICD-10-CM

## 2013-12-11 LAB — URINALYSIS, ROUTINE W REFLEX MICROSCOPIC
Bilirubin Urine: NEGATIVE
Glucose, UA: NEGATIVE mg/dL
Hgb urine dipstick: NEGATIVE
KETONES UR: NEGATIVE mg/dL
LEUKOCYTES UA: NEGATIVE
NITRITE: NEGATIVE
Protein, ur: NEGATIVE mg/dL
SPECIFIC GRAVITY, URINE: 1.007 (ref 1.005–1.030)
UROBILINOGEN UA: 0.2 mg/dL (ref 0.0–1.0)
pH: 7 (ref 5.0–8.0)

## 2013-12-12 ENCOUNTER — Ambulatory Visit
Admission: RE | Admit: 2013-12-12 | Discharge: 2013-12-12 | Disposition: A | Discharge status: Home / Self Care | Payer: BC Managed Care – PPO | Source: Ambulatory Visit | Attending: Neurological Surgery | Admitting: Neurological Surgery

## 2013-12-12 VITALS — BP 108/50 | HR 71

## 2013-12-12 DIAGNOSIS — M545 Low back pain, unspecified: Secondary | ICD-10-CM

## 2013-12-12 LAB — URINE CULTURE: Colony Count: 60000

## 2013-12-12 MED ORDER — IOHEXOL 180 MG/ML  SOLN
15.0000 mL | Freq: Once | INTRAMUSCULAR | Status: AC | PRN
Start: 2013-12-12 — End: 2013-12-12
  Administered 2013-12-12: 15 mL via INTRATHECAL

## 2013-12-12 MED ORDER — DIAZEPAM 5 MG PO TABS
10.0000 mg | ORAL_TABLET | Freq: Once | ORAL | Status: AC
  Administered 2013-12-12: 10 mg via ORAL

## 2013-12-12 NOTE — Discharge Instructions (Signed)

## 2013-12-16 ENCOUNTER — Telehealth: Payer: Self-pay | Admitting: *Deleted

## 2013-12-16 ENCOUNTER — Other Ambulatory Visit: Payer: Self-pay | Admitting: *Deleted

## 2013-12-16 MED ORDER — ALPRAZOLAM 0.5 MG PO TABS: 0.5000 mg | ORAL_TABLET | Freq: Three times a day (TID) | ORAL | Status: DC | PRN

## 2013-12-16 NOTE — Telephone Encounter (Signed)
Patient called. Her spouse died and she requested RX for nerves.  Rx alprazolam 0.5 mg called to [facility] on [address].

## 2014-01-15 ENCOUNTER — Ambulatory Visit (INDEPENDENT_AMBULATORY_CARE_PROVIDER_SITE_OTHER): Payer: BC Managed Care – PPO | Admitting: Internal Medicine

## 2014-01-15 ENCOUNTER — Encounter: Payer: Self-pay | Admitting: Internal Medicine

## 2014-01-15 VITALS — BP 102/68 | HR 88 | Temp 97.9°F | Resp 16 | Ht 61.0 in | Wt 131.8 lb

## 2014-01-15 DIAGNOSIS — F411 Generalized anxiety disorder: Secondary | ICD-10-CM

## 2014-01-15 DIAGNOSIS — R7309 Other abnormal glucose: Secondary | ICD-10-CM | POA: Insufficient documentation

## 2014-01-15 DIAGNOSIS — E782 Mixed hyperlipidemia: Secondary | ICD-10-CM

## 2014-01-15 DIAGNOSIS — Z79899 Other long term (current) drug therapy: Secondary | ICD-10-CM | POA: Insufficient documentation

## 2014-01-15 DIAGNOSIS — N3000 Acute cystitis without hematuria: Secondary | ICD-10-CM

## 2014-01-15 LAB — HEMOGLOBIN A1C
Hgb A1c MFr Bld: 5.5 % (ref <5.7)
Mean Plasma Glucose: 111 mg/dL (ref <117)

## 2014-01-15 MED ORDER — ALPRAZOLAM 1 MG PO TABS: ORAL_TABLET | ORAL | Status: DC

## 2014-01-15 NOTE — Patient Instructions (Signed)
Hypercholesterolemia High Blood Cholesterol Cholesterol is a white, waxy, fat-like protein needed by your body in small amounts. The liver makes all the cholesterol you need. It is carried from the liver by the blood through the blood vessels. Deposits (plaque) may build up on blood vessel walls. This makes the arteries narrower and stiffer. Plaque increases the risk for heart attack and stroke. You cannot feel your cholesterol level even if it is very high. The only way to know is by a blood test to check your lipid (fats) levels. Once you know your cholesterol levels, you should keep a record of the test results. Work with your caregiver to to keep your levels in the desired range. WHAT THE RESULTS MEAN:  Total cholesterol is a rough measure of all the cholesterol in your blood.  LDL is the so-called bad cholesterol. This is the type that deposits cholesterol in the walls of the arteries. You want this level to be low.  HDL is the good cholesterol because it cleans the arteries and carries the LDL away. You want this level to be high.  Triglycerides are fat that the body can either burn for energy or store. High levels are closely linked to heart disease. DESIRED LEVELS:  Total cholesterol below 200.  LDL below 100 for people at risk, below 70 for very high risk.  HDL above 50 is good, above 60 is best.  Triglycerides below 150. HOW TO LOWER YOUR CHOLESTEROL:  Diet.  Choose fish or white meat chicken and [place], roasted or baked. Limit fatty cuts of red meat, fried foods, and processed meats, such as sausage and lunch meat.  Eat lots of fresh fruits and vegetables. Choose whole grains, beans, pasta, potatoes and cereals.  Use only small amounts of olive, corn or canola oils. Avoid butter, mayonnaise, shortening or palm kernel oils. Avoid foods with trans-fats.  Use skim/nonfat milk and low-fat/nonfat yogurt and cheeses. Avoid whole milk, cream, ice cream, egg yolks and cheeses.  Healthy desserts include angel food cake, gingersnaps, animal crackers, hard candy, popsicles, and low-fat/nonfat frozen yogurt. Avoid pastries, cakes, pies and cookies.  Exercise.  A regular program helps decrease LDL and raises HDL.  Helps with weight control.  Do things that increase your activity level like gardening, walking, or taking the stairs.  Medication.  May be prescribed by your caregiver to help lowering cholesterol and the risk for heart disease.  You may need medicine even if your levels are normal if you have several risk factors. HOME CARE INSTRUCTIONS   Follow your diet and exercise programs as suggested by your caregiver.  Take medications as directed.  Have blood work done when your caregiver feels it is necessary. MAKE SURE YOU:   Understand these instructions.  Will watch your condition.  Will get help right away if you are not doing well or get worse. Document Released: 09/05/2005 Document Revised: 11/28/2011 Document Reviewed: 02/21/2007 [facility] Patient Information 2014 [place]. Type 1 Diabetes Mellitus, Adult Type 1 diabetes mellitus, often simply referred to as diabetes, is a long-term (chronic) disease. It occurs when the islet cells in the pancreas that make insulin (a hormone) are destroyed and can no longer make insulin. Insulin is needed to move sugars from food into the tissue cells. The tissue cells use the sugars for energy. In people with type 1 diabetes, the sugars build up in the blood instead of going into the tissue cells. As a result, high blood sugar (hyperglycemia) develops. Without insulin, the body  breaks down fat cells for the needed energy. This breakdown of fat cells produces acid chemicals (ketones), which increases the acid levels in the body. The effect of either high ketone or sugar (glucose) levels can be life-threatening.  Type 1 diabetes was also previously called juvenile diabetes. It most often occurs before the age of  62, but it can occur at any age. RISK FACTORS A person is predisposed to developing type 1 diabetes if someone in his or her family has the disease and is exposed to certain additional environmental triggers.  SYMPTOMS  Symptoms of type 1 diabetes may develop gradually over days to weeks or suddenly. The symptoms occur due to hyperglycemia. The symptoms can include:   Increased thirst (polydipsia).  Increased urination (polyuria).  Increased urination during the night (nocturia).  Weight loss. This weight loss may be rapid.  Frequent, recurring infections.  Tiredness (fatigue).  Weakness.  Vision changes, such as blurred vision.  Fruity smell to your breath.  Abdominal pain.  Nausea or vomiting. DIAGNOSIS  Type 1 diabetes is diagnosed when symptoms of diabetes are present and when blood glucose levels are increased. Your blood glucose level may be checked by one or more of the following blood tests:  A fasting blood glucose test. You will not be allowed to eat for at least 8 hours before a blood sample is taken.  A random blood glucose test. Your blood glucose is checked at any time of the day regardless of when you ate.  A hemoglobin A1c blood glucose test. A hemoglobin A1c test provides information about blood glucose control over the previous 3 months. TREATMENT  Although type 1 diabetes cannot be prevented, it can be managed with insulin, diet, and exercise.  You will need to take insulin daily to keep blood glucose in the desired range.  You will need to match insulin dosing with exercise and healthy food choices. The treatment goal is to maintain the before-meal blood sugar (preprandial glucose) level at 70 130 mg/dL.  HOME CARE INSTRUCTIONS   Have your hemoglobin A1c level checked twice a year.  Perform daily blood glucose monitoring as directed by your caregiver.  Monitor urine ketones when you are ill and as directed by your caregiver.  Take your insulin  as directed by your caregiver to maintain your blood glucose level in the desired range.  Never run out of insulin. It is needed every day.  Adjust insulin based on your intake of carbohydrates. Carbohydrates can raise blood glucose levels but need to be included in your diet. Carbohydrates provide vitamins, minerals, and fiber, which are an essential part of a healthy diet. Carbohydrates are found in fruits, vegetables, whole grains, dairy products, legumes, and foods containing added sugars.    Eat healthy foods. Alternate 3 meals with 3 snacks.  Maintain a healthy weight.  Carry a medical alert card or wear your medical alert jewelry.  Carry a 15 gram carbohydrate snack with you at all times to treat low blood glucose (hypoglycemia). Some examples of 15 gram carbohydrate snacks include:  Glucose tablets, 3 or 4.   Glucose gel, 15 gram tube.  Raisins, 2 tablespoons (24 grams).  Jelly beans, 6.  Animal crackers, 8.  Fruit juice, regular soda, or low-fat milk, 4 ounces (120 mL).  Gummy treats, 9.    Recognize hypoglycemia. Hypoglycemia occurs with blood glucose levels of 70 mg/dL and below. The risk for hypoglycemia increases when fasting or skipping meals, during or after intense exercise, and during  sleep. Hypoglycemia symptoms can include:  Tremors or shakes.  Decreased ability to concentrate.  Sweating.  Increased heart rate.  Headache.  Dry mouth.  Hunger.  Irritability.  Anxiety.  Restless sleep.  Altered speech or coordination.  Confusion.  Treat hypoglycemia promptly. If you are alert and able to safely swallow, follow the 15:15 rule:  Take 15 20 grams of rapid-acting glucose or carbohydrate. Rapid-acting options include glucose gel, glucose tablets, or 4 ounces (120 mL) of fruit juice, regular soda, or low-fat milk.  Check your blood glucose level 15 minutes after taking the glucose.   Take 15 20 grams more of glucose if the repeat blood  glucose level is still 70 mg/dL or below.  Eat a meal or snack within 1 hour once blood glucose levels return to normal.  Be alert to polyuria and polydipsia, which are early signs of hyperglycemia. An early awareness of hyperglycemia allows for prompt treatment. Treat hyperglycemia as directed by your caregiver.  Engage in at least 150 minutes of moderate-intensity physical activity a week, spread over at least 3 days of the week or as directed by your caregiver.  Adjust your insulin dosing and food intake as needed if you start a new exercise or sport.  Follow your sick day plan at any time you are unable to eat or drink as usual.   Avoid tobacco use.  Limit alcohol intake to no more than 1 drink per day for nonpregnant women and 2 drinks per day for men. You should drink alcohol only when you are also eating food. Talk with your caregiver about whether alcohol is safe for you. Tell your caregiver if you drink alcohol several times a week.  Follow up with your caregiver regularly.  Schedule an eye exam within 5 years of diagnosis and then annually.  Perform daily skin and foot care. Examine your skin and feet daily for cuts, bruises, redness, nail problems, bleeding, blisters, or sores. A foot exam by a caregiver should be done annually.  Brush your teeth and gums at least twice a day and floss at least once a day. Follow up with your dentist regularly.  Share your diabetes management plan with your workplace or school.  Stay up-to-date with immunizations.  Learn to manage stress.  Obtain ongoing diabetes education and support as needed.  Participate or seek rehabilitation as needed to maintain or improve independence and quality of life. Request a physical or occupational therapy referral if you are having foot or hand numbness or difficulties with grooming, dressing, eating, or physical activity. SEEK MEDICAL CARE IF:   You are unable to eat food or drink fluids for more than  6 hours.  You have nausea and vomiting for more than 6 hours.  Your blood glucose level is over 240 mg/dL.  There is a change in mental status.  You develop an additional serious illness.  You have diarrhea for more than 6 hours.  You have been sick or have had a fever for a couple of days and are not getting better.  You have pain during any physical activity. SEEK IMMEDIATE MEDICAL CARE IF:  You have difficulty breathing.  You have moderate to large ketone levels. MAKE SURE YOU:  Understand these instructions.  Will watch your condition.  Will get help right away if you are not doing well or get worse. Document Released: 09/02/2000 Document Revised: 05/30/2012 Document Reviewed: 04/03/2012 [facility] Patient Information 2014 [place]. Urinary Tract Infection Urinary tract infections (UTIs) can develop  anywhere along your urinary tract. Your urinary tract is your body's drainage system for removing wastes and extra water. Your urinary tract includes two kidneys, two ureters, a bladder, and a urethra. Your kidneys are a pair of bean-shaped organs. Each kidney is about the size of your fist. They are located below your ribs, one on each side of your spine. CAUSES Infections are caused by microbes, which are microscopic organisms, including fungi, viruses, and bacteria. These organisms are so small that they can only be seen through a microscope. Bacteria are the microbes that most commonly cause UTIs. SYMPTOMS  Symptoms of UTIs may vary by age and gender of the patient and by the location of the infection. Symptoms in young women typically include a frequent and intense urge to urinate and a painful, burning feeling in the bladder or urethra during urination. Older women and men are more likely to be tired, shaky, and weak and have muscle aches and abdominal pain. A fever may mean the infection is in your kidneys. Other symptoms of a kidney infection include pain in your back or  sides below the ribs, nausea, and vomiting. DIAGNOSIS To diagnose a UTI, your caregiver will ask you about your symptoms. Your caregiver also will ask to provide a urine sample. The urine sample will be tested for bacteria and white blood cells. White blood cells are made by your body to help fight infection. TREATMENT  Typically, UTIs can be treated with medication. Because most UTIs are caused by a bacterial infection, they usually can be treated with the use of antibiotics. The choice of antibiotic and length of treatment depend on your symptoms and the type of bacteria causing your infection. HOME CARE INSTRUCTIONS  If you were prescribed antibiotics, take them exactly as your caregiver instructs you. Finish the medication even if you feel better after you have only taken some of the medication.  Drink enough water and fluids to keep your urine clear or pale yellow.  Avoid caffeine, tea, and carbonated beverages. They tend to irritate your bladder.  Empty your bladder often. Avoid holding urine for long periods of time.  Empty your bladder before and after sexual intercourse.  After a bowel movement, women should cleanse from front to back. Use each tissue only once. SEEK MEDICAL CARE IF:   You have back pain.  You develop a fever.  Your symptoms do not begin to resolve within 3 days. SEEK IMMEDIATE MEDICAL CARE IF:   You have severe back pain or lower abdominal pain.  You develop chills.  You have nausea or vomiting.  You have continued burning or discomfort with urination. MAKE SURE YOU:   Understand these instructions.  Will watch your condition.  Will get help right away if you are not doing well or get worse. Document Released: 06/15/2005 Document Revised: 03/06/2012 Document Reviewed: 10/14/2011 [facility] Patient Information 2014 [place].

## 2014-01-15 NOTE — Progress Notes (Signed)
   Subjective:    Patient ID: Monique Thomas, female    DOB: Sep 22, 1950, 63 y.o.   MRN: [id number]  HPI Very nice 63 yo recently widowed WF (Husb died sudden death  ~ 2 weeks ago ? Cerebral Aneurysm) returns for F/U of recent post Tx of UTI and has hx/o abn Lipids and glucose . Patient still grieving loss of spouse. Having more insomnia.  Current Outpatient Prescriptions on File Prior to Visit  Medication Sig Dispense Refill  . cholecalciferol (VITAMIN D) 1000 UNITS tablet Take 1,000 Units by mouth daily. TAKE 3 qd      . estradiol (ESTRACE) 0.5 MG tablet Take 0.5 mg by mouth daily.      Marland Kitchen levothyroxine (SYNTHROID, LEVOTHROID) 88 MCG tablet Take 88 mcg by mouth daily before breakfast. Brand name only 1 QD      . Magnesium 250 MG TABS Take by mouth.      . Multiple Vitamins-Minerals (MULTIVITAMIN WITH MINERALS) tablet Take 1 tablet by mouth daily.       No current facility-administered medications on file prior to visit.    Allergies  Allergen Reactions  . Topamax [Topiramate]     MEMORY  . Entex Lq [Phenylephrine-Guaifenesin] Rash  . Fiorinal [Butalbital-Aspirin-Caffeine] Rash   Past Medical History  Diagnosis Date  . Hyperlipidemia   . Thyroid disease   . GERD (gastroesophageal reflux disease)   . Anemia   . Anxiety   . Back pain, chronic   . Unspecified vitamin D deficiency    Review of Systems In addition to the HPI above,  No Fever-chills,  No Headache, No changes with Vision or hearing,  No problems swallowing food or Liquids,  No Chest pain or productive Cough or Shortness of Breath,  No Abdominal pain, No Nausea or Vommitting, Bowel movements are regular,  No Blood in stool or Urine,  No dysuria,  No new skin rashes or bruises,  No new joints pains-aches,  No new weakness, tingling, numbness in any extremity,  No recent weight loss,  No polyuria, polydypsia or polyphagia,  No significant Mental Stressors.  A full 10 point Review of Systems was done, except as  stated above, all other Review of Systems were negative  Objective:   Physical Exam  BP 102/68  Pulse 88  Temp(Src) 97.9 F (36.6 C) (Temporal)  Resp 16  Ht 5\' 1"  (1.549 m)  Wt 131 lb 12.8 oz (59.784 kg)  BMI 24.92 kg/m2  HEENT - Eac's patent. TM's Nl.EOM's full. PERRLA. NasoOroPharynx clear. Neck - supple. Nl Thyroid. No bruits nodes JVD Chest - Clear equal BS Cor - Nl HS. RRR w/o sig MGR. PP 1(+) No edema. Abd - No palpable organomegaly, masses or tenderness. BS nl. MS- FROM. w/o deformities. Muscle power tone and bulk Nl. Gait Nl. Neuro - No obvious Cr N abnormalities. Sensory, motor and Cerebellar functions appear Nl w/o focal abnormalities.  Assessment & Plan:   1. Other abnormal glucose - Insulin, fasting - Hemoglobin A1c  2. Mixed hyperlipidemia - Lipid panel  3. Acute cystitis - Urine Microscopic - Urine culture  4. Anxiety state, unspecified  - ALPRAZolam (XANAX) 1 MG tablet; Take 1/2 to 1 tablet 3 x daily as needed for anxiety or sleep  Dispense: 90 tablet; Refill: 3  3 mo F/U w/ M. SMITH PA-C

## 2014-01-16 LAB — URINE CULTURE: Colony Count: 70000

## 2014-01-16 LAB — LIPID PANEL
CHOL/HDL RATIO: 2.3 ratio
CHOLESTEROL: 181 mg/dL (ref 0–200)
HDL: 79 mg/dL (ref >39)
LDL Cholesterol: 83 mg/dL (ref 0–99)
Triglycerides: 97 mg/dL (ref <150)
VLDL: 19 mg/dL (ref 0–40)

## 2014-01-16 LAB — INSULIN, FASTING: Insulin fasting, serum: 14 u[IU]/mL (ref 3–28)

## 2014-01-16 LAB — URINALYSIS, MICROSCOPIC ONLY
BACTERIA UA: NONE SEEN
CRYSTALS: NONE SEEN
Casts: NONE SEEN
Squamous Epithelial / LPF: NONE SEEN

## 2014-03-10 ENCOUNTER — Encounter: Payer: Self-pay | Admitting: Emergency Medicine

## 2014-03-10 ENCOUNTER — Other Ambulatory Visit: Payer: Self-pay | Admitting: Emergency Medicine

## 2014-03-17 ENCOUNTER — Ambulatory Visit (INDEPENDENT_AMBULATORY_CARE_PROVIDER_SITE_OTHER): Payer: BC Managed Care – PPO | Admitting: Emergency Medicine

## 2014-03-17 ENCOUNTER — Encounter: Payer: Self-pay | Admitting: Emergency Medicine

## 2014-03-17 VITALS — BP 122/58 | HR 88 | Temp 98.2°F | Resp 16 | Wt 128.8 lb

## 2014-03-17 DIAGNOSIS — R238 Other skin changes: Secondary | ICD-10-CM

## 2014-03-17 DIAGNOSIS — R5383 Other fatigue: Principal | ICD-10-CM

## 2014-03-17 DIAGNOSIS — R5381 Other malaise: Secondary | ICD-10-CM

## 2014-03-17 DIAGNOSIS — R252 Cramp and spasm: Secondary | ICD-10-CM

## 2014-03-17 DIAGNOSIS — R239 Unspecified skin changes: Secondary | ICD-10-CM

## 2014-03-17 LAB — CBC WITH DIFFERENTIAL/PLATELET
BASOS PCT: 1 % (ref 0–1)
Basophils Absolute: 0.1 10*3/uL (ref 0.0–0.1)
EOS PCT: 2 % (ref 0–5)
Eosinophils Absolute: 0.1 10*3/uL (ref 0.0–0.7)
HEMATOCRIT: 35.6 % — AB (ref 36.0–46.0)
Hemoglobin: 12.3 g/dL (ref 12.0–15.0)
Lymphocytes Relative: 30 % (ref 12–46)
Lymphs Abs: 1.5 10*3/uL (ref 0.7–4.0)
MCH: 29.6 pg (ref 26.0–34.0)
MCHC: 34.6 g/dL (ref 30.0–36.0)
MCV: 85.6 fL (ref 78.0–100.0)
MONO ABS: 0.4 10*3/uL (ref 0.1–1.0)
Monocytes Relative: 7 % (ref 3–12)
NEUTROS ABS: 3 10*3/uL (ref 1.7–7.7)
Neutrophils Relative %: 60 % (ref 43–77)
Platelets: 367 10*3/uL (ref 150–400)
RBC: 4.16 MIL/uL (ref 3.87–5.11)
RDW: 13.7 % (ref 11.5–15.5)
WBC: 5 10*3/uL (ref 4.0–10.5)

## 2014-03-17 MED ORDER — SERTRALINE HCL 50 MG PO TABS: 50.0000 mg | ORAL_TABLET | Freq: Every day | ORAL | Status: DC

## 2014-03-17 NOTE — Progress Notes (Signed)
Subjective:    Patient ID: Monique Thomas, female    DOB: January 12, 1951, 63 y.o.   MRN: [id number]  HPI Comments: 63 yo WF needs mole removal of left posterior thigh. She notes increased size and irritation with clothing wear and shaving.   She notes more leg cramping over last couple of days. She notes she is trying to eat healthy but often gets nauseated with depression and has had decreased appetite with depression. She is having some depression with loss of husband. She notes some mood swings and agitation but feels she is overall doing a little better. She has some difficulty with sleeping with out her husband. She denies HI/ SI.     Medication List       This list is accurate as of: 03/17/14  2:00 PM.  Always use your most recent med list.               ALPRAZolam 1 MG tablet  Commonly known as:  XANAX  Take 1/2 to 1 tablet 3 x daily as needed for anxiety or sleep     cholecalciferol 1000 UNITS tablet  Commonly known as:  VITAMIN D  Take 1,000 Units by mouth daily. TAKE 3 qd     estradiol 0.5 MG tablet  Commonly known as:  ESTRACE  Take 0.5 mg by mouth daily.     levothyroxine 88 MCG tablet  Commonly known as:  SYNTHROID, LEVOTHROID  Take 88 mcg by mouth daily before breakfast. Brand name only 1 QD     Magnesium 250 MG Tabs  Take by mouth.     multivitamin with minerals tablet  Take 1 tablet by mouth daily.       Allergies  Allergen Reactions  . Topamax [Topiramate]     MEMORY  . Entex Lq [Phenylephrine-Guaifenesin] Rash  . Fiorinal [Butalbital-Aspirin-Caffeine] Rash   Past Medical History  Diagnosis Date  . Hyperlipidemia   . Thyroid disease   . GERD (gastroesophageal reflux disease)   . Anemia   . Anxiety   . Back pain, chronic   . Unspecified vitamin D deficiency      Review of Systems  Skin: Positive for color change.  Psychiatric/Behavioral: Positive for sleep disturbance and agitation. The patient is nervous/anxious.   All other systems  reviewed and are negative.  BP 122/58  Pulse 88  Temp(Src) 98.2 F (36.8 C)  Resp 16  Wt 128 lb 12.8 oz (58.423 kg)      Objective:   Physical Exam  Nursing note and vitals reviewed. Constitutional: She is oriented to person, place, and time. She appears well-developed and well-nourished.  HENT:  Head: Normocephalic and atraumatic.  Right Ear: External ear normal.  Left Ear: External ear normal.  Nose: Nose normal.  Mouth/Throat: Oropharynx is clear and moist.  Eyes: Conjunctivae and EOM are normal.  Neck: Normal range of motion.  Cardiovascular: Normal rate, regular rhythm, normal heart sounds and intact distal pulses.   Pulmonary/Chest: Effort normal and breath sounds normal.  Musculoskeletal: Normal range of motion.  Lymphadenopathy:    She has no cervical adenopathy.  Neurological: She is alert and oriented to person, place, and time.  Skin: Skin is warm and dry.  Left posterior thigh 4 mm elevated medium brown/ fleshy raised/ scaling area  Psychiatric: She has a normal mood and affect. Her behavior is normal. Judgment and thought content normal.  Tearful but appropriate          Assessment & Plan:  1. Depression with loss of husband- Zoloft 50 mg start 1 in evening increase folic acid intake. Continue xanax AD, try to decrease use after 2 weeks on Zoloft. Advise Hospice grief counseling. w/c if SX increase or ER.   2.  Irreg/ atypical moles- Verbal permission obtained. Cryosurgery performed. Wound hygiene given. SX of infection explained pt w/c with any concerns.

## 2014-03-17 NOTE — Patient Instructions (Addendum)
Depression, Adult Depression is feeling sad, low, down in the dumps, blue, gloomy, or empty. In general, there are two kinds of depression:  Normal sadness or grief. This can happen after something upsetting. It often goes away on its own within 2 weeks. After losing a loved one (bereavement), normal sadness and grief may last longer than two weeks. It usually gets better with time.  Clinical depression. This kind lasts longer than normal sadness or grief. It keeps you from doing the things you normally do in life. It is often hard to function at home, work, or at school. It may affect your relationships with others. Treatment is often needed. GET HELP RIGHT AWAY IF:  You have thoughts about hurting yourself or others.  You lose touch with reality (psychotic symptoms). You may:  See or hear things that are not real.  Have untrue beliefs about your life or people around you.  Your medicine is giving you problems. MAKE SURE YOU:  Understand these instructions.  Will watch your condition.  Will get help right away if you are not doing well or get worse. Document Released: 10/08/2010 Document Revised: 05/30/2012 Document Reviewed: 01/05/2012 [facility] Patient Information 2015 [place], [place]. This information is not intended to replace advice given to you by your health care provider. Make sure you discuss any questions you have with your health care provider. Wound Care Wound care helps prevent pain and infection.  You may need a tetanus shot if:  You cannot remember when you had your last tetanus shot.  You have never had a tetanus shot.  The injury broke your skin. If you need a tetanus shot and you choose not to have one, you may get tetanus. Sickness from tetanus can be serious. HOME CARE   Only take medicine as told by your doctor.  Clean the wound daily with mild soap and water.  Change any bandages (dressings) as told by your doctor.  Put medicated cream and a bandage  on the wound as told by your doctor.  Change the bandage if it gets wet, dirty, or starts to smell.  Take showers. Do not take baths, swim, or do anything that puts your wound under water.  Rest and raise (elevate) the wound until the pain and puffiness (swelling) are better.  Keep all doctor visits as told. GET HELP RIGHT AWAY IF:   Yellowish-white fluid (pus) comes from the wound.  Medicine does not lessen your pain.  There is a red streak going away from the wound.  You have a fever. MAKE SURE YOU:   Understand these instructions.  Will watch your condition.  Will get help right away if you are not doing well or get worse. Document Released: 06/14/2008 Document Revised: 11/28/2011 Document Reviewed: 01/09/2011 [facility] Patient Information 2015 [place], [place]. This information is not intended to replace advice given to you by your health care provider. Make sure you discuss any questions you have with your health care provider.

## 2014-03-18 LAB — IRON AND TIBC
%SAT: 22 % (ref 20–55)
Iron: 66 ug/dL (ref 42–145)
TIBC: 306 ug/dL (ref 250–470)
UIBC: 240 ug/dL (ref 125–400)

## 2014-03-18 LAB — TSH: TSH: 1.043 u[IU]/mL (ref 0.350–4.500)

## 2014-03-18 LAB — FOLATE RBC: RBC FOLATE: 506 ng/mL (ref >280)

## 2014-03-18 LAB — BASIC METABOLIC PANEL WITH GFR
BUN: 11 mg/dL (ref 6–23)
CHLORIDE: 106 meq/L (ref 96–112)
CO2: 25 mEq/L (ref 19–32)
CREATININE: 0.61 mg/dL (ref 0.50–1.10)
Calcium: 9 mg/dL (ref 8.4–10.5)
GFR, Est African American: >89 mL/min
Glucose, Bld: 107 mg/dL — ABNORMAL HIGH (ref 70–99)
Potassium: 3.9 mEq/L (ref 3.5–5.3)
Sodium: 140 mEq/L (ref 135–145)

## 2014-03-18 LAB — HEPATIC FUNCTION PANEL
ALT: 9 U/L (ref 0–35)
AST: 17 U/L (ref 0–37)
Albumin: 4.3 g/dL (ref 3.5–5.2)
Alkaline Phosphatase: 47 U/L (ref 39–117)
BILIRUBIN INDIRECT: 0.3 mg/dL (ref 0.2–1.2)
Bilirubin, Direct: 0.1 mg/dL (ref 0.0–0.3)
TOTAL PROTEIN: 6.4 g/dL (ref 6.0–8.3)
Total Bilirubin: 0.4 mg/dL (ref 0.2–1.2)

## 2014-03-18 LAB — VITAMIN B12: VITAMIN B 12: 1574 pg/mL — AB (ref 211–911)

## 2014-03-27 ENCOUNTER — Ambulatory Visit (INDEPENDENT_AMBULATORY_CARE_PROVIDER_SITE_OTHER): Payer: BC Managed Care – PPO

## 2014-03-27 ENCOUNTER — Ambulatory Visit: Payer: Self-pay

## 2014-03-27 DIAGNOSIS — Z23 Encounter for immunization: Secondary | ICD-10-CM

## 2014-03-27 NOTE — Progress Notes (Signed)
Patient ID: Monique Thomas, female   DOB: 1951-08-15, 63 y.o.   MRN: [id number] Patient here today to receive Zostavax Vaccine.

## 2014-04-17 ENCOUNTER — Ambulatory Visit: Payer: Self-pay | Admitting: Emergency Medicine

## 2014-05-12 DIAGNOSIS — G8929 Other chronic pain: Secondary | ICD-10-CM | POA: Insufficient documentation

## 2014-05-12 DIAGNOSIS — M533 Sacrococcygeal disorders, not elsewhere classified: Secondary | ICD-10-CM | POA: Insufficient documentation

## 2014-05-12 DIAGNOSIS — M5126 Other intervertebral disc displacement, lumbar region: Secondary | ICD-10-CM | POA: Insufficient documentation

## 2014-05-12 DIAGNOSIS — M5416 Radiculopathy, lumbar region: Secondary | ICD-10-CM | POA: Insufficient documentation

## 2014-05-12 DIAGNOSIS — M545 Low back pain, unspecified: Secondary | ICD-10-CM | POA: Insufficient documentation

## 2014-07-11 ENCOUNTER — Other Ambulatory Visit: Payer: Self-pay | Admitting: Internal Medicine

## 2014-07-22 ENCOUNTER — Encounter: Payer: Self-pay | Admitting: Physician Assistant

## 2014-07-22 ENCOUNTER — Ambulatory Visit: Payer: Self-pay | Admitting: Internal Medicine

## 2014-07-22 ENCOUNTER — Ambulatory Visit (INDEPENDENT_AMBULATORY_CARE_PROVIDER_SITE_OTHER): Payer: BC Managed Care – PPO | Admitting: Physician Assistant

## 2014-07-22 VITALS — BP 120/68 | HR 84 | Temp 97.6°F | Resp 16 | Ht 61.0 in | Wt 124.0 lb

## 2014-07-22 DIAGNOSIS — S7402XA Injury of sciatic nerve at hip and thigh level, left leg, initial encounter: Secondary | ICD-10-CM

## 2014-07-22 MED ORDER — PREDNISONE 20 MG PO TABS: ORAL_TABLET | ORAL | Status: DC

## 2014-07-22 MED ORDER — PREGABALIN 50 MG PO CAPS
50.0000 mg | ORAL_CAPSULE | Freq: Three times a day (TID) | ORAL | Status: DC
Start: 2014-07-22 — End: 2014-08-27

## 2014-07-22 MED ORDER — DEXAMETHASONE SODIUM PHOSPHATE 100 MG/10ML IJ SOLN
10.0000 mg | Freq: Once | INTRAMUSCULAR | Status: AC
  Administered 2014-07-22: 10 mg via INTRAMUSCULAR

## 2014-07-22 NOTE — Patient Instructions (Signed)
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
## 2014-08-10 ENCOUNTER — Encounter: Payer: Self-pay | Admitting: *Deleted

## 2014-08-27 ENCOUNTER — Telehealth: Payer: Self-pay | Admitting: Physician Assistant

## 2014-08-27 MED ORDER — GABAPENTIN 300 MG PO CAPS: 300.0000 mg | ORAL_CAPSULE | Freq: Three times a day (TID) | ORAL | Status: DC

## 2014-08-27 NOTE — Telephone Encounter (Signed)
Patient is calling and requesting Gabapentin rather than lyrica for hip/back pain. If it worsens, needs to come into the office or go to ER.

## 2014-08-28 ENCOUNTER — Telehealth: Payer: Self-pay

## 2014-08-28 NOTE — Telephone Encounter (Signed)
Patient started Gabapentin yesterday and pharmacist told her that it was ok to take on an empty stomach. Patient took medication on empty stomach and was in bed feeling nauseous from 1pm yesterday until 9am this morning. Patient states pain in no better. Per Estill Bamberg, patient can try 1/2 tablet with food and if still with symptoms then patient is to stop medication. Left message for patient to return my call.

## 2014-09-03 ENCOUNTER — Encounter: Payer: Self-pay | Admitting: Physician Assistant

## 2014-09-03 ENCOUNTER — Ambulatory Visit (HOSPITAL_COMMUNITY)
Admission: RE | Admit: 2014-09-03 | Discharge: 2014-09-03 | Disposition: A | Discharge status: Home / Self Care | Payer: BC Managed Care – PPO | Source: Ambulatory Visit | Attending: Physician Assistant | Admitting: Physician Assistant

## 2014-09-03 ENCOUNTER — Other Ambulatory Visit: Payer: Self-pay | Admitting: Physician Assistant

## 2014-09-03 ENCOUNTER — Ambulatory Visit (INDEPENDENT_AMBULATORY_CARE_PROVIDER_SITE_OTHER): Payer: BC Managed Care – PPO | Admitting: Physician Assistant

## 2014-09-03 VITALS — BP 106/72 | HR 104 | Temp 98.2°F | Resp 16 | Ht 61.0 in | Wt 127.0 lb

## 2014-09-03 DIAGNOSIS — M25551 Pain in right hip: Secondary | ICD-10-CM | POA: Diagnosis not present

## 2014-09-03 DIAGNOSIS — M25552 Pain in left hip: Secondary | ICD-10-CM | POA: Diagnosis present

## 2014-09-03 DIAGNOSIS — G8929 Other chronic pain: Secondary | ICD-10-CM | POA: Diagnosis not present

## 2014-09-03 DIAGNOSIS — M25559 Pain in unspecified hip: Secondary | ICD-10-CM

## 2014-09-03 DIAGNOSIS — M545 Low back pain: Secondary | ICD-10-CM

## 2014-09-03 DIAGNOSIS — R1031 Right lower quadrant pain: Secondary | ICD-10-CM

## 2014-09-03 MED ORDER — PREDNISONE 20 MG PO TABS: ORAL_TABLET | ORAL | Status: AC

## 2014-09-03 MED ORDER — TRAMADOL HCL 50 MG PO TABS
50.0000 mg | ORAL_TABLET | Freq: Four times a day (QID) | ORAL | Status: DC | PRN
Start: 2014-09-03 — End: 2015-01-01

## 2014-09-03 NOTE — Patient Instructions (Addendum)
-continue medications as prescribed. -Take Tramadol as prescribed with food. Mendel Ryder with call you with referral to [facility]. Mendel Ryder will call you for CT scan date and time. -Take Prednisone as prescribed.  [facility]  Please keep physical on 10/01/14.   Hernia A hernia occurs when an internal organ pushes out through a weak spot in the abdominal wall. Hernias most commonly occur in the groin and around the navel. Hernias often can be pushed back into place (reduced). Most hernias tend to get worse over time. Some abdominal hernias can get stuck in the opening (irreducible or incarcerated hernia) and cannot be reduced. An irreducible abdominal hernia which is tightly squeezed into the opening is at risk for impaired blood supply (strangulated hernia). A strangulated hernia is a medical emergency. Because of the risk for an irreducible or strangulated hernia, surgery may be recommended to repair a hernia. CAUSES   Heavy lifting.  Prolonged coughing.  Straining to have a bowel movement.  A cut (incision) made during an abdominal surgery. HOME CARE INSTRUCTIONS   Bed rest is not required. You may continue your normal activities.  Avoid lifting more than 10 pounds (4.5 kg) or straining.  Cough gently. If you are a smoker it is best to stop. Even the best hernia repair can break down with the continual strain of coughing. Even if you do not have your hernia repaired, a cough will continue to aggravate the problem.  Do not wear anything tight over your hernia. Do not try to keep it in with an outside bandage or truss. These can damage abdominal contents if they are trapped within the hernia sac.  Eat a normal diet.  Avoid constipation. Straining over long periods of time will increase hernia size and encourage breakdown of repairs. If you cannot do this with diet alone, stool softeners may be used. SEEK IMMEDIATE MEDICAL CARE IF:    You have a fever.  You develop increasing abdominal pain.  You feel nauseous or vomit.  Your hernia is stuck outside the abdomen, looks discolored, feels hard, or is tender.  You have any changes in your bowel habits or in the hernia that are unusual for you.  You have increased pain or swelling around the hernia.  You cannot push the hernia back in place by applying gentle pressure while lying down. MAKE SURE YOU:   Understand these instructions.  Will watch your condition.  Will get help right away if you are not doing well or get worse. Document Released: 09/05/2005 Document Revised: 11/28/2011 Document Reviewed: 04/24/2008 [facility] Patient Information 2015 [place], [place]. This information is not intended to replace advice given to you by your health care provider. Make sure you discuss any questions you have with your health care provider.   Chronic Back Pain  When back pain lasts longer than 3 months, it is called chronic back pain.People with chronic back pain often go through certain periods that are more intense (flare-ups).  CAUSES Chronic back pain can be caused by wear and tear (degeneration) on different structures in your back. These structures include:  The bones of your spine (vertebrae) and the joints surrounding your spinal cord and nerve roots (facets).  The strong, fibrous tissues that connect your vertebrae (ligaments). Degeneration of these structures may result in pressure on your nerves. This can lead to constant pain. HOME CARE INSTRUCTIONS  Avoid bending, heavy lifting, prolonged sitting, and activities which make the problem worse.  Take  brief periods of rest throughout the day to reduce your pain. Lying down or standing usually is better than sitting while you are resting.  Take over-the-counter or prescription medicines only as directed by your caregiver. SEEK IMMEDIATE MEDICAL CARE IF:   You have weakness or numbness in one of your legs or  feet.  You have trouble controlling your bladder or bowels.  You have nausea, vomiting, abdominal pain, shortness of breath, or fainting. Document Released: 10/13/2004 Document Revised: 11/28/2011 Document Reviewed: 08/20/2011 [facility] Patient Information 2015 [place], [place]. This information is not intended to replace advice given to you by your health care provider. Make sure you discuss any questions you have with your health care provider.
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
## 2014-09-08 ENCOUNTER — Ambulatory Visit
Admission: RE | Admit: 2014-09-08 | Discharge: 2014-09-08 | Disposition: A | Discharge status: Home / Self Care | Payer: BC Managed Care – PPO | Source: Ambulatory Visit | Attending: Physician Assistant | Admitting: Physician Assistant

## 2014-09-08 DIAGNOSIS — R1031 Right lower quadrant pain: Secondary | ICD-10-CM

## 2014-09-09 ENCOUNTER — Encounter: Payer: Self-pay | Admitting: Physician Assistant

## 2014-09-09 ENCOUNTER — Other Ambulatory Visit: Payer: Self-pay | Admitting: Physician Assistant

## 2014-09-09 DIAGNOSIS — N200 Calculus of kidney: Secondary | ICD-10-CM

## 2014-09-25 ENCOUNTER — Encounter: Payer: Self-pay | Admitting: Emergency Medicine

## 2014-10-01 ENCOUNTER — Ambulatory Visit (INDEPENDENT_AMBULATORY_CARE_PROVIDER_SITE_OTHER): Payer: BC Managed Care – PPO | Admitting: Emergency Medicine

## 2014-10-01 ENCOUNTER — Encounter: Payer: Self-pay | Admitting: Emergency Medicine

## 2014-10-01 VITALS — BP 104/60 | HR 86 | Temp 98.6°F | Resp 18 | Ht 60.5 in | Wt 126.0 lb

## 2014-10-01 DIAGNOSIS — F419 Anxiety disorder, unspecified: Secondary | ICD-10-CM

## 2014-10-01 DIAGNOSIS — Z79899 Other long term (current) drug therapy: Secondary | ICD-10-CM

## 2014-10-01 DIAGNOSIS — M545 Low back pain, unspecified: Secondary | ICD-10-CM

## 2014-10-01 DIAGNOSIS — E538 Deficiency of other specified B group vitamins: Secondary | ICD-10-CM

## 2014-10-01 DIAGNOSIS — R1032 Left lower quadrant pain: Secondary | ICD-10-CM

## 2014-10-01 DIAGNOSIS — F32A Depression, unspecified: Secondary | ICD-10-CM

## 2014-10-01 DIAGNOSIS — Z0000 Encounter for general adult medical examination without abnormal findings: Secondary | ICD-10-CM

## 2014-10-01 DIAGNOSIS — Z0001 Encounter for general adult medical examination with abnormal findings: Secondary | ICD-10-CM

## 2014-10-01 DIAGNOSIS — E785 Hyperlipidemia, unspecified: Secondary | ICD-10-CM

## 2014-10-01 DIAGNOSIS — E559 Vitamin D deficiency, unspecified: Secondary | ICD-10-CM

## 2014-10-01 DIAGNOSIS — R6889 Other general symptoms and signs: Secondary | ICD-10-CM

## 2014-10-01 DIAGNOSIS — F329 Major depressive disorder, single episode, unspecified: Secondary | ICD-10-CM

## 2014-10-01 DIAGNOSIS — Z1212 Encounter for screening for malignant neoplasm of rectum: Secondary | ICD-10-CM

## 2014-10-01 DIAGNOSIS — Z111 Encounter for screening for respiratory tuberculosis: Secondary | ICD-10-CM

## 2014-10-01 DIAGNOSIS — I10 Essential (primary) hypertension: Secondary | ICD-10-CM

## 2014-10-01 LAB — CBC WITH DIFFERENTIAL/PLATELET
BASOS ABS: 0.1 10*3/uL (ref 0.0–0.1)
BASOS PCT: 1 % (ref 0–1)
EOS ABS: 0.2 10*3/uL (ref 0.0–0.7)
Eosinophils Relative: 4 % (ref 0–5)
HEMATOCRIT: 37.2 % (ref 36.0–46.0)
Hemoglobin: 12.8 g/dL (ref 12.0–15.0)
Lymphocytes Relative: 36 % (ref 12–46)
Lymphs Abs: 1.8 10*3/uL (ref 0.7–4.0)
MCH: 31.3 pg (ref 26.0–34.0)
MCHC: 34.4 g/dL (ref 30.0–36.0)
MCV: 91 fL (ref 78.0–100.0)
MPV: 8 fL — ABNORMAL LOW (ref 8.6–12.4)
Monocytes Absolute: 0.4 10*3/uL (ref 0.1–1.0)
Monocytes Relative: 8 % (ref 3–12)
NEUTROS ABS: 2.6 10*3/uL (ref 1.7–7.7)
NEUTROS PCT: 51 % (ref 43–77)
PLATELETS: 363 10*3/uL (ref 150–400)
RBC: 4.09 MIL/uL (ref 3.87–5.11)
RDW: 13.1 % (ref 11.5–15.5)
WBC: 5 10*3/uL (ref 4.0–10.5)

## 2014-10-01 MED ORDER — ALPRAZOLAM 1 MG PO TABS: ORAL_TABLET | ORAL | Status: DC

## 2014-10-01 NOTE — Progress Notes (Signed)
Subjective:    Patient ID: Monique Thomas, female    DOB: 07-25-1951, 64 y.o.   MRN: [id number]  HPI Comments: 64 yo WF CPE. She isnot exercising but is trying to eat healthy. Her last labs WNL except mildly elevated BS. She also manages cholesterol with diet with elevations in the past.  Patient continues to have low back pain but declines implantable pain device.  She is in pain management for injections which have not helped. She also has 2 kidney stones on right side but notes pain is worse on LEFT side with back. She is supposed to be having lithotripsy in the future. She notes pain has changed and now is in the left groin area but had hips evaluated by Dr Cay Schillings.Hip exam was WNL. She has not been exercising due to the pain.   She had UA at Urology yesterday. She had TSH/ PAP earlier last month with GYN. She did not discuss LLQ pain with GYN despite history of adhesion removal in past for similar type symptoms.  Depression has mildly improved since loss of husband. She is using xanax usually twice daily. She notes increased stress over daughters health concerns. She notes she has tried other depression RX in past and did not like the results and would prefer to manage symptoms with Xanax.  Lab Results      Component                Value               Date                      WBC                      5.0                 03/17/2014                HGB                      12.3                03/17/2014                HCT                      35.6*               03/17/2014                PLT                      367                 03/17/2014                GLUCOSE                  107*                03/17/2014                CHOL                     181                 01/15/2014  TRIG                     97                  01/15/2014                HDL                      79                  01/15/2014                LDLCALC                  83                   01/15/2014                ALT                      9                   03/17/2014                AST                      17                  03/17/2014                NA                       140                 03/17/2014                K                        3.9                 03/17/2014                CL                       106                 03/17/2014                CREATININE               0.61                03/17/2014                BUN                      11                  03/17/2014                CO2                      25                  03/17/2014  TSH                      1.043               03/17/2014                INR                      1.0                 11/24/2008                HGBA1C                   5.5                 01/15/2014                MICROALBUR               0.86                09/23/2013                Medication List       This list is accurate as of: 10/01/14 11:59 PM.  Always use your most recent med list.               ALPRAZolam 1 MG tablet  Commonly known as:  XANAX  TAKE 1/2-1 TABLET BY MOUTH THREE TIMES DAILY AS NEEDED FOR ANXIETY OR SLEEP     cholecalciferol 1000 UNITS tablet  Commonly known as:  VITAMIN D  Take 1,000 Units by mouth daily. TAKE 3 qd     estradiol 0.5 MG tablet  Commonly known as:  ESTRACE  Take 0.5 mg by mouth daily.     levothyroxine 88 MCG tablet  Commonly known as:  SYNTHROID, LEVOTHROID  Take 88 mcg by mouth daily before breakfast. Brand name only 1 QD     Magnesium 250 MG Tabs  Take by mouth.     multivitamin with minerals tablet  Take 1 tablet by mouth daily.     traMADol 50 MG tablet  Commonly known as:  ULTRAM  Take 1 tablet (50 mg total) by mouth every 6 (six) hours as needed for moderate pain or severe pain.       Allergies  Allergen Reactions  . Topamax [Topiramate]     MEMORY  . Entex Lq [Phenylephrine-Guaifenesin] Rash  . Fiorinal  [Butalbital-Aspirin-Caffeine] Rash    Past Medical History  Diagnosis Date  . Hyperlipidemia   . Thyroid disease   . GERD (gastroesophageal reflux disease)   . Anemia   . Anxiety   . Back pain, chronic   . Unspecified vitamin D deficiency   . Kidney stone     2015   Past Surgical History  Procedure Laterality Date  . Abdominal hysterectomy    . Cesarean section    . Hernia repair    . Appendectomy    . Tonsillectomy and adenoidectomy    . Spine surgery      lumbar   History  Substance Use Topics  . Smoking status: Never Smoker   . Smokeless tobacco: Not on file  . Alcohol Use: No   Family History  Problem Relation Age of Onset  . Heart disease Mother   . Hyperlipidemia Mother   . Thyroid disease Mother   . Asthma Sister   .  Cancer Maternal Grandmother     ovarian   MAINTENANCE: Colonoscopy:02/2007 polyps due 2018 per patient Mammo:07/07/14 BMD:2008 WNL declines Pap/ Pelvic:09/21/14 WNL EYE:08/2014 WNL glasses, cataracts bilateral Dentist:Q 6 month due 10/2014  IMMUNIZATIONS: Td:2007 Pneumovax:2015 Zostavax:2015 Influenza:2015  Patient Care Team: [name], MD as PCP - General (Internal Medicine) Darlis Loan, MD as Referring Physician (Gastroenterology) Vania Rea, MD as Consulting Physician (Obstetrics and Gynecology) Ailene Rud, MD as Consulting Physician (Urology) Carlynn Spry, PA-C as Physician Assistant (Orthopedic Surgery) Lavonna Monarch, MD as Consulting Physician (Dermatology) Gatha Mayer, MD as Consulting Physician (Gastroenterology) Webb Laws, OD as Referring Physician (Optometry) Fuller,(Dentist) GEOFFREY,(ORTHO)  Review of Systems  Respiratory: Negative for shortness of breath.   Cardiovascular: Negative for chest pain.  Gastrointestinal: Positive for abdominal pain.  Musculoskeletal: Positive for back pain.  Psychiatric/Behavioral: The patient is nervous/anxious.   All other systems reviewed and are  negative.  BP 104/60 mmHg  Pulse 86  Temp(Src) 98.6 F (37 C) (Temporal)  Resp 18  Ht 5' 0.5" (1.537 m)  Wt 126 lb (57.153 kg)  BMI 24.19 kg/m2     Objective:   Physical Exam  Constitutional: She is oriented to person, place, and time. She appears well-developed and well-nourished. No distress.  HENT:  Head: Normocephalic and atraumatic.  Right Ear: External ear normal.  Left Ear: External ear normal.  Nose: Nose normal.  Mouth/Throat: Oropharynx is clear and moist.  Eyes: Conjunctivae and EOM are normal. Pupils are equal, round, and reactive to light. No scleral icterus.  Neck: Normal range of motion. Neck supple. No JVD present. No tracheal deviation present. No thyromegaly present.  Cardiovascular: Normal rate, regular rhythm, normal heart sounds and intact distal pulses.   Pulmonary/Chest: Effort normal and breath sounds normal.  Abdominal: Soft. Bowel sounds are normal. She exhibits no distension and no mass. There is tenderness. There is no rebound and no guarding.  LLQ Mild  Genitourinary:  Def GYN  Musculoskeletal: Normal range of motion. She exhibits no edema or tenderness.  Lymphadenopathy:    She has no cervical adenopathy.  Neurological: She is alert and oriented to person, place, and time. She has normal reflexes. No cranial nerve deficit. She exhibits normal muscle tone. Coordination normal.  Skin: Skin is warm and dry. No rash noted.  Full at [place]: Her behavior is normal. Judgment and thought content normal.  Improved from last visit but still mildly depressed/ almost flat affect  Nursing note and vitals reviewed.    AORTA SCAN WNL EKG NSCSPT      Assessment & Plan:  1. CPE- Update screening labs/ History/ Immunizations/ Testing as needed. Advised healthy diet, QD exercise, increase H20 and continue RX/ Vitamins AD.   2. Chronic back pain- Continue pain management/ Ortho f/u AD. Advised water exercise  3.LLQ pain- ? Adhesions vs  iliopsoasis muscle strain vs back- Advised with hx of adhesions in past if NEG results with stretches needs to f/u GYN for possible re-evaluation  4. Depression- Advised counseling, increase activity, continue prescriptions AD.

## 2014-10-01 NOTE — Patient Instructions (Signed)
Iliotibial Band Syndrome with Rehab The iliotibial (IT) band is a tendon that connects the hip muscles to the shinbone (tibia) and to one of the bones of the pelvis (ileum). The IT band passes by the knee and is often irritated by the outer portion of the knee (lateral femoral condyle). A fluid filled sac (bursa) exists between the tendon and the bone, to cushion and reduce friction. Overuse of the tendon may cause excessive friction, which results in IT band syndrome. This condition involves inflammation of the bursa (bursitis) and/or inflammation of the IT band (tendinitis). SYMPTOMS   Pain, tenderness, swelling, warmth, or redness over the IT band, at the outer knee (above the joint).  Pain that travels up or down the thigh or leg.  Initially, pain at the beginning of an exercise, that decreases once warmed up. Eventually, pain throughout the activity, getting worse as the activity continues. May cause the athlete to stop in the middle of training or competing.  Pain that gets worse when running down hills or stairs, on banked tracks, or next to the curb on the street.  Pain that increases when the foot of the affected leg hits the ground.  Possibly, a crackling sound (crepitation) when the tendon or bursa is moved or touched. CAUSES  IT band syndrome is caused by irritation of the IT band and the underlying bursa. This eventually results in inflammation and pain. IT band syndrome is an overuse injury.  RISK INCREASES WITH:  Sports with repetitive knee-bending activities (distance running, cycling).  Incorrect training techniques, including sudden changes in the intensity, frequency, or duration of training.  Not enough rest between workouts.  Poor strength and flexibility, especially a tight IT band.  Failure to warm up properly before activity.  Bow legs.  Arthritis of the knee. PREVENTION   Warm up and stretch properly before activity.  Allow for adequate recovery between  workouts.  Maintain physical fitness:  Strength, flexibility, and endurance.  Cardiovascular fitness.  Learn and use proper training technique, including reducing running mileage, shortening stride, and avoiding running on hills and banked surfaces.  Wear arch supports (orthotics), if you have flat feet. PROGNOSIS  If treated properly, IT band syndrome usually goes away within 6 weeks of treatment. RELATED COMPLICATIONS   Longer healing time, if not properly treated, or if not given enough time to heal.  Recurring inflammation of the tendon and bursa, that may result in a chronic condition.  Recurring symptoms, if activity is resumed too soon, with overuse, with a direct blow, or with poor training technique.  Inability to complete training or competition. TREATMENT  Treatment first involves the use of ice and medicine, to reduce pain and inflammation. The use of strengthening and stretching exercises may help reduce pain with activity. These exercises may be performed at home or with a therapist. For individuals with flat feet, an arch support (orthotic) may be helpful. Some individuals find that wearing a knee sleeve or compression bandage around the knee during workouts provides some relief. Certain training techniques, such as adjusting stride length, avoiding running on hills or stairs, changing the direction you run on a circular or banked track, or changing the side of the road you run on, if you run next to the curb, may help decrease symptoms of IT band syndrome. Cyclists may need to change the seat height or foot position on their bicycles. An injection of cortisone into the bursa may be recommended. Surgery to remove the inflamed bursa and/or part  of the IT band is only considered after at least 6 months of non-surgical treatment.  MEDICATION   If pain medicine is needed, nonsteroidal anti-inflammatory medicines (aspirin and ibuprofen), or other minor pain relievers  (acetaminophen), are often advised.  Do not take pain medicine for 7 days before surgery.  Prescription pain relievers may be given, if your caregiver thinks they are needed. Use only as directed and only as much as you need.  Corticosteroid injections may be given by your caregiver. These injections should be reserved for the most serious cases, because they may only be given a certain number of times. HEAT AND COLD  Cold treatment (icing) should be applied for 10 to 15 minutes every 2 to 3 hours for inflammation and pain, and immediately after activity that aggravates your symptoms. Use ice packs or an ice massage.  Heat treatment may be used before performing stretching and strengthening activities prescribed by your caregiver, physical therapist, or athletic trainer. Use a heat pack or a warm water soak. SEEK MEDICAL CARE IF:   Symptoms get worse or do not improve in 2 to 4 weeks, despite treatment.  New, unexplained symptoms develop. (Drugs used in treatment may produce side effects.) EXERCISES  RANGE OF MOTION (ROM) AND STRETCHING EXERCISES - Iliotibial Band Syndrome These exercises may help you when beginning to rehabilitate your injury. Your symptoms may go away with or without further involvement from your physician, physical therapist or athletic trainer. While completing these exercises, remember:   Restoring tissue flexibility helps normal motion to return to the joints. This allows healthier, less painful movement and activity.  An effective stretch should be held for at least 30 seconds.  A stretch should never be painful. You should only feel a gentle lengthening or release in the stretched tissue. STRETCH - Quadriceps, Prone   Lie on your stomach on a firm surface, such as a bed or padded floor.  Bend your right / left knee and grasp your ankle. If you are unable to reach your ankle or pant leg, use a belt around your foot to lengthen your reach.  Gently pull your heel  toward your buttocks. Your knee should not slide out to the side. You should feel a stretch in the front of your thigh and knee.  Hold this position for __________ seconds. Repeat __________ times. Complete this stretch __________ times per day.  STRETCH - Iliotibial Band  On the floor or bed, lie on your side, so your right / left leg is on top. Bend your knee and grab your ankle.  Slowly bring your knee back so that your thigh is in line with your trunk. Keep your heel at your buttocks and gently arch your back, so your head, shoulders and hips line up.  Slowly lower your leg so that your knee approaches the floor or bed, until you feel a gentle stretch on the outside of your right / left thigh. If you do not feel a stretch and your knee will not fall farther, place the heel of your opposite foot on top of your knee, and pull your thigh down farther.  Hold this stretch for __________ seconds. Repeat __________ times. Complete this stretch __________ times per day. STRENGTHENING EXERCISES - Iliotibial Band Syndrome Improving the flexibility of the IT band will best relieve your discomfort due to IT band syndrome. Strengthening exercises, however, can help improve both muscle endurance and joint mechanics, reducing the factors that can contribute to this condition. Your physician, physical   therapist or athletic trainer may provide you with exercises that train specific muscle groups that are especially weak. The following exercises target muscles that are often weak in people who have IT band syndrome. STRENGTH - Hip Abductors, Straight Leg Raises  Be aware of your form throughout the entire exercise, so that you exercise the correct muscles. Poor form means that you are not strengthening the correct muscles.  Lie on your side, so that your head, shoulders, knee and hip line up. You may bend your lower knee to help maintain your balance. Your right / left leg should be on top.  Roll your hips  slightly forward, so that your hips are stacked directly over each other and your right / left knee is facing forward.  Lift your top leg up 4-6 inches, leading with your heel. Be sure that your foot does not drift forward and that your knee does not roll toward the ceiling.  Hold this position for __________ seconds. You should feel the muscles in your outer hip lifting (you may not notice this until your leg begins to tire).  Slowly lower your leg to the starting position. Allow the muscles to fully relax before beginning the next repetition. Repeat __________ times. Complete this exercise __________ times per day.  STRENGTH - Quad/VMO, Isometric  Sit in a chair with your right / left knee slightly bent. With your fingertips, feel the VMO muscle (just above the inside of your knee). The VMO is important in controlling the position of your kneecap.  Keeping your fingertips on this muscle. Without actually moving your leg, attempt to drive your knee down, as if straightening your leg. You should feel your VMO tense. If you have a difficult time, you may wish to try the same exercise on your healthy knee first.  Tense this muscle as hard as you can, without increasing any knee pain.  Hold for __________ seconds. Relax the muscles slowly and completely between each repetition. Repeat __________ times. Complete this exercise __________ times per day.  Document Released: 09/05/2005 Document Revised: 11/28/2011 Document Reviewed: 12/18/2008 [facility] Patient Information 2015 [place], [place]. This information is not intended to replace advice given to you by your health care provider. Make sure you discuss any questions you have with your health care provider.  Hip Exercises RANGE OF MOTION (ROM) AND STRETCHING EXERCISES  These exercises may help you when beginning to rehabilitate your injury. Doing them too aggressively can worsen your condition. Complete them slowly and gently. Your symptoms may  resolve with or without further involvement from your physician, physical therapist or athletic trainer. While completing these exercises, remember:   Restoring tissue flexibility helps normal motion to return to the joints. This allows healthier, less painful movement and activity.  An effective stretch should be held for at least 30 seconds.  A stretch should never be painful. You should only feel a gentle lengthening or release in the stretched tissue. If these stretches worsen your symptoms even when done gently, consult your physician, physical therapist or athletic trainer. STRETCH - Hamstrings, Supine   Lie on your back. Loop a belt or towel over the ball of your right / left foot.  Straighten your right / left knee and slowly pull on the belt to raise your leg. Do not allow the right / left knee to bend. Keep your opposite leg flat on the floor.  Raise the leg until you feel a gentle stretch behind your right / left knee or thigh. Hold  this position for __________ seconds. Repeat __________ times. Complete this stretch __________ times per day.  STRETCH - Hip Rotators   Lie on your back on a firm surface. Grasp your right / left knee with your right / left hand and your ankle with your opposite hand.  Keeping your hips and shoulders firmly planted, gently pull your right / left knee and rotate your lower leg toward your opposite shoulder until you feel a stretch in your buttocks.  Hold this stretch for __________ seconds. Repeat this stretch __________ times. Complete this stretch __________ times per day. STRETCH - Hamstrings/Adductors, V-Sit   Sit on the floor with your legs extended in a large "V," keeping your knees straight.  With your head and chest upright, bend at your waist reaching for your right foot to stretch your left adductors.  You should feel a stretch in your left inner thigh. Hold for __________ seconds.  Return to the upright position to relax your leg  muscles.  Continuing to keep your chest upright, bend straight forward at your waist to stretch your hamstrings.  You should feel a stretch behind both of your thighs and/or knees. Hold for __________ seconds.  Return to the upright position to relax your leg muscles.  Repeat steps 2 through 4 for opposite leg. Repeat __________ times. Complete this exercise __________ times per day.  STRETCHING - Hip Flexors, Lunge  Half kneel with your right / left knee on the floor and your opposite knee bent and directly over your ankle.  Keep good posture with your head over your shoulders. Tighten your buttocks to point your tailbone downward; this will prevent your back from arching too much.  You should feel a gentle stretch in the front of your thigh and/or hip. If you do not feel any resistance, slightly slide your opposite foot forward and then slowly lunge forward so your knee once again lines up over your ankle. Be sure your tailbone remains pointed downward.  Hold this stretch for __________ seconds. Repeat __________ times. Complete this stretch __________ times per day. STRENGTHENING EXERCISES These exercises may help you when beginning to rehabilitate your injury. They may resolve your symptoms with or without further involvement from your physician, physical therapist or athletic trainer. While completing these exercises, remember:   Muscles can gain both the endurance and the strength needed for everyday activities through controlled exercises.  Complete these exercises as instructed by your physician, physical therapist or athletic trainer. Progress the resistance and repetitions only as guided.  You may experience muscle soreness or fatigue, but the pain or discomfort you are trying to eliminate should never worsen during these exercises. If this pain does worsen, stop and make certain you are following the directions exactly. If the pain is still present after adjustments, discontinue  the exercise until you can discuss the trouble with your clinician. STRENGTH - Hip Extensors, Bridge   Lie on your back on a firm surface. Bend your knees and place your feet flat on the floor.  Tighten your buttocks muscles and lift your bottom off the floor until your trunk is level with your thighs. You should feel the muscles in your buttocks and back of your thighs working. If you do not feel these muscles, slide your feet 1-2 inches further away from your buttocks.  Hold this position for __________ seconds.  Slowly lower your hips to the starting position and allow your buttock muscles relax completely before beginning the next repetition.  If this  exercise is too easy, you may cross your arms over your chest. Repeat __________ times. Complete this exercise __________ times per day.  STRENGTH - Hip Abductors, Straight Leg Raises  Be aware of your form throughout the entire exercise so that you exercise the correct muscles. Sloppy form means that you are not strengthening the correct muscles.  Lie on your side so that your head, shoulders, knee and hip line up. You may bend your lower knee to help maintain your balance. Your right / left leg should be on top.  Roll your hips slightly forward, so that your hips are stacked directly over each other and your right / left knee is facing forward.  Lift your top leg up 4-6 inches, leading with your heel. Be sure that your foot does not drift forward or that your knee does not roll toward the ceiling.  Hold this position for __________ seconds. You should feel the muscles in your outer hip lifting (you may not notice this until your leg begins to tire).  Slowly lower your leg to the starting position. Allow the muscles to fully relax before beginning the next repetition. Repeat __________ times. Complete this exercise __________ times per day.  STRENGTH - Hip Adductors, Straight Leg Raises   Lie on your side so that your head, shoulders,  knee and hip line up. You may place your upper foot in front to help maintain your balance. Your right / left leg should be on the bottom.  Roll your hips slightly forward, so that your hips are stacked directly over each other and your right / left knee is facing forward.  Tense the muscles in your inner thigh and lift your bottom leg 4-6 inches. Hold this position for __________ seconds.  Slowly lower your leg to the starting position. Allow the muscles to fully relax before beginning the next repetition. Repeat __________ times. Complete this exercise __________ times per day.  STRENGTH - Quadriceps, Straight Leg Raises  Quality counts! Watch for signs that the quadriceps muscle is working to insure you are strengthening the correct muscles and not "cheating" by substituting with healthier muscles.  Lay on your back with your right / left leg extended and your opposite knee bent.  Tense the muscles in the front of your right / left thigh. You should see either your knee cap slide up or increased dimpling just above the knee. Your thigh may even quiver.  Tighten these muscles even more and raise your leg 4 to 6 inches off the floor. Hold for right / left seconds.  Keeping these muscles tense, lower your leg.  Relax the muscles slowly and completely in between each repetition. Repeat __________ times. Complete this exercise __________ times per day.  STRENGTH - Hip Abductors, Standing  Tie one end of a rubber exercise band/tubing to a secure surface (table, pole) and tie a loop at the other end.  Place the loop around your right / left ankle. Keeping your ankle with the band directly opposite of the secured end, step away until there is tension in the tube/band.  Hold onto a chair as needed for balance.  Keeping your back upright, your shoulders over your hips, and your toes pointing forward, lift your right / left leg out to your side. Be sure to lift your leg with your hip muscles. Do  not "throw" your leg or tip your body to lift your leg.  Slowly and with control, return to the starting position. Repeat exercise __________  times. Complete this exercise __________ times per day.  STRENGTH - Quadriceps, Squats  Stand in a door frame so that your feet and knees are in line with the frame.  Use your hands for balance, not support, on the frame.  Slowly lower your weight, bending at the hips and knees. Keep your lower legs upright so that they are parallel with the door frame. Squat only within the range that does not increase your knee pain. Never let your hips drop below your knees.  Slowly return upright, pushing with your legs, not pulling with your hands. Document Released: 09/23/2005 Document Revised: 11/28/2011 Document Reviewed: 12/18/2008 [facility] Patient Information 2015 [place], [place]. This information is not intended to replace advice given to you by your health care provider. Make sure you discuss any questions you have with your health care provider. Tuberculin Skin Test The PPD skin test is a method used to help with the diagnosis of a disease called tuberculosis (TB). HOW THE TEST IS DONE  The test site (usually the forearm) is cleansed. The PPD extract is then injected under the top layer of skin, causing a blister to form on the skin. The reaction will take 48 - 72 hours to develop. You must return to your health care provider within that time to have the area checked. This will determine whether you have had a significant reaction to the PPD test. A reaction is measured in millimeters of hard swelling (induration) at the site. PREPARATION FOR TEST  There is no special preparation for this test. People with a skin rash or other skin irritations on their arms may need to have the test performed at a different spot on the body. Tell your health care provider if you have ever had a positive PPD skin test. If so, you should not have a repeat PPD test. Tell your  doctor if you have a medical condition or if you take certain drugs, such as steroids, that can affect your immune system. These situations may lead to inaccurate test results. NORMAL FINDINGS A negative reaction (no induration) or a level of hard swelling that falls below a certain cutoff may mean that a person has not been infected with the bacteria that cause TB. There are different cutoffs for children, people with HIV, and other risk groups. Unfortunately, this is not a perfect test, and up to 20% of people infected with tuberculosis may not have a reaction on the PPD skin test. In addition, certain conditions that affect the immune system (cancer, recent chemotherapy, late-stage AIDS) may cause a false-negative test result.  The reaction will take 48 - 72 hours to develop. You must return to your health care provider within that time to have the area checked. Follow your caregiver's instructions as to where and when to report for this to be done. Ranges for normal findings may vary among different laboratories and hospitals. You should always check with your doctor after having lab work or other tests done to discuss the meaning of your test results and whether your values are considered within normal limits. WHAT ABNORMAL RESULTS MEAN  The results of the test depend on the size of the skin reaction and on the person being tested.  A small reaction (5 mm of hard swelling at the site) is considered to be positive in people who have HIV, who are taking steroid therapy, or who have been in close contact with a person who has active tuberculosis. Larger reactions (greater than or equal to  10 mm) are considered positive in people with diabetes or kidney failure, and in health care workers, among others. In people with no known risks for tuberculosis, a positive reaction requires 15 mm or more of hard swelling at the site. RISKS AND COMPLICATIONS There is a very small risk of severe redness and swelling  of the arm in people who have had a previous positive PPD test and who have the test again. There also have been a few rare cases of this reaction in people who have not been tested before. CONSIDERATIONS  A positive skin test does not necessarily mean that a person has active tuberculosis. More tests will be done to check whether active disease is present. Many people who were born outside the [place] may have had a vaccine called "BCG," which can lead to a false-positive test result. MEANING OF TEST  Your caregiver will go over the test results with you and discuss the importance and meaning of your results, as well as treatment options and the need for additional tests if necessary. OBTAINING THE TEST RESULTS It is your responsibility to obtain your test results. Ask the lab or department performing the test when and how you will get your results. Document Released: 06/15/2005 Document Revised: 11/28/2011 Document Reviewed: 12/13/2013 [facility] Patient Information 2015 [place], [place]. This information is not intended to replace advice given to you by your health care provider. Make sure you discuss any questions you have with your health care provider.

## 2014-10-02 LAB — HEPATIC FUNCTION PANEL
ALT: 11 U/L (ref 0–35)
AST: 18 U/L (ref 0–37)
Albumin: 4.2 g/dL (ref 3.5–5.2)
Alkaline Phosphatase: 50 U/L (ref 39–117)
BILIRUBIN INDIRECT: 0.2 mg/dL (ref 0.2–1.2)
BILIRUBIN TOTAL: 0.3 mg/dL (ref 0.2–1.2)
Bilirubin, Direct: 0.1 mg/dL (ref 0.0–0.3)
Total Protein: 6.9 g/dL (ref 6.0–8.3)

## 2014-10-02 LAB — HEMOGLOBIN A1C
HEMOGLOBIN A1C: 5.5 % (ref <5.7)
MEAN PLASMA GLUCOSE: 111 mg/dL (ref <117)

## 2014-10-02 LAB — INSULIN, FASTING: INSULIN FASTING, SERUM: 5 u[IU]/mL (ref 2.0–19.6)

## 2014-10-02 LAB — LIPID PANEL
CHOL/HDL RATIO: 2.4 ratio
Cholesterol: 174 mg/dL (ref 0–200)
HDL: 73 mg/dL (ref >39)
LDL Cholesterol: 83 mg/dL (ref 0–99)
TRIGLYCERIDES: 91 mg/dL (ref <150)
VLDL: 18 mg/dL (ref 0–40)

## 2014-10-02 LAB — BASIC METABOLIC PANEL WITH GFR
BUN: 13 mg/dL (ref 6–23)
CALCIUM: 9.2 mg/dL (ref 8.4–10.5)
CO2: 25 mEq/L (ref 19–32)
Chloride: 104 mEq/L (ref 96–112)
Creat: 0.61 mg/dL (ref 0.50–1.10)
Glucose, Bld: 83 mg/dL (ref 70–99)
Potassium: 4 mEq/L (ref 3.5–5.3)
SODIUM: 141 meq/L (ref 135–145)

## 2014-10-02 LAB — VITAMIN B12: VITAMIN B 12: 1701 pg/mL — AB (ref 211–911)

## 2014-10-02 LAB — MAGNESIUM: MAGNESIUM: 1.6 mg/dL (ref 1.5–2.5)

## 2014-10-02 LAB — VITAMIN D 25 HYDROXY (VIT D DEFICIENCY, FRACTURES): VIT D 25 HYDROXY: 35 ng/mL (ref 30–100)

## 2014-10-02 LAB — FOLATE RBC: RBC FOLATE: 660 ng/mL (ref >280)

## 2014-10-03 ENCOUNTER — Encounter: Payer: Self-pay | Admitting: Emergency Medicine

## 2014-10-03 LAB — TB SKIN TEST
Induration: 0 mm
TB Skin Test: NEGATIVE

## 2014-10-06 ENCOUNTER — Other Ambulatory Visit: Payer: Self-pay | Admitting: Urology

## 2014-10-07 ENCOUNTER — Encounter (HOSPITAL_COMMUNITY): Payer: Self-pay | Admitting: *Deleted

## 2014-10-12 NOTE — H&P (Signed)
Reason For Visit Kidney stone   Active Problems Problems  1. Right nephrolithiasis (N20.0)  History of Present Illness 64 yo female, last seen 10/03/06, referred back by Dr. Melford Aase for RLQ abdominal pain. CT showed a 3.68mm Rt upper pole kidney stone and a 6.57mm parenchymal calcification Rt mid pole. She has chronic back pain. CT scan at [facility] shows 6.4mm stone in Right mid kidney, and 3.40mm stone in RUP.   Past Medical History Problems  1. History of Anxiety (F41.9) 2. History of Chronic back pain (M54.9,G89.29) 3. History of anemia (Z86.2) 4. History of esophageal reflux (Z87.19) 5. History of hyperlipidemia (Z86.39) 6. History of hypothyroidism (Z86.39) 7. History of kidney stones (Z87.442) 8. History of Vitamin D deficiency (E55.9)  Surgical History Problems  1. History of Appendectomy 2. History of Bladder Surgery 3. History of Cesarean Section 4. History of Hysterectomy  Current Meds 1. Estrace 0.5 MG Oral Tablet;  Therapy: (Recorded:12Jan2016) to Recorded 2. Magnesium 250 MG Oral Tablet;  Therapy: (Recorded:12Jan2016) to Recorded 3. Multivitamin/Iron TABS;  Therapy: (Recorded:12Jan2016) to Recorded 4. Sertraline HCl - 50 MG Oral Tablet;  Therapy: (Recorded:12Jan2016) to Recorded 5. Synthroid 88 MCG Oral Tablet;  Therapy: (Recorded:12Jan2016) to Recorded 6. TraMADol HCl - 50 MG Oral Tablet;  Therapy: (Recorded:12Jan2016) to Recorded 7. Vitamin D 1000 UNIT Oral Tablet;  Therapy: (Recorded:12Jan2016) to Recorded 8. Xanax 1 MG Oral Tablet;  Therapy: (Recorded:12Jan2016) to Recorded  Allergies Medication  1. Entex ER 2. Fiorinal CAPS 3. Topamax TABS  Family History Problems  1. Family history of Family Health Status Number Of Children  Social History Problems    Denied: Alcohol Use   Caffeine Use   Marital History - Currently Married   Occupation:   Denied: Tobacco Use  Review of Systems Genitourinary, constitutional, skin, eye, otolaryngeal,  hematologic/lymphatic, cardiovascular, pulmonary, endocrine, musculoskeletal, gastrointestinal, neurological and psychiatric system(s) were reviewed and pertinent findings if present are noted and are otherwise negative.  Genitourinary: incontinence.  Gastrointestinal: constipation.  Musculoskeletal: back pain and joint pain.  Psychiatric: depression and anxiety.    Vitals Vital Signs [Data Includes: Last 1 Day]  Recorded: 12Jan2016 01:47PM  Height: 5 ft 1.5 in Weight: 125 lb  BMI Calculated: 23.24 BSA Calculated: 1.56 Blood Pressure: 134 / 77 Heart Rate: 91  Physical Exam Constitutional: Well nourished and well developed . No acute distress.  ENT:. The ears and nose are normal in appearance.  Neck: The appearance of the neck is normal and no neck mass is present.  Pulmonary: No respiratory distress and normal respiratory rhythm and effort.  Cardiovascular: Heart rate and rhythm are normal . No peripheral edema.  Abdomen: The abdomen is soft and nontender. No masses are palpated. No CVA tenderness. No hernias are palpable. No hepatosplenomegaly noted.  Lymphatics: The femoral and inguinal nodes are not enlarged or tender.  Skin: Normal skin turgor, no visible rash and no visible skin lesions.  Neuro/Psych:. Mood and affect are appropriate.    Results/Data Urine [Data Includes: Last 1 Day]   [id number]  COLOR YELLOW   APPEARANCE CLEAR   SPECIFIC GRAVITY 1.020   pH 6.0   GLUCOSE NEG mg/dL  BILIRUBIN NEG   KETONE NEG mg/dL  BLOOD NEG   PROTEIN NEG mg/dL  UROBILINOGEN 0.2 mg/dL  NITRITE NEG   LEUKOCYTE ESTERASE NEG    Assessment Assessed  1. Right nephrolithiasis (N20.0)  64 yo female Right nephrolithiasis, with back pain. I think her back pain is from her lumbosacral disease, but note that her stone  is large enough to cause problems if it moves.   Plan Health Maintenance  1. UA With REFLEX; [Do Not Release]; Status:Resulted - Requires  Verification;   Done: [id number]  01:24PM  Lithotripsy.   Discussion/Summary cc: [name], MD   cc: Dr. Sherley Bounds     Signatures Electronically signed by : Carolan Clines, M.D.; Sep 30 2014  2:14PM EST

## 2014-10-13 ENCOUNTER — Ambulatory Visit (HOSPITAL_COMMUNITY)
Admission: RE | Admit: 2014-10-13 | Discharge: 2014-10-13 | Disposition: A | Discharge status: Home / Self Care | Payer: BC Managed Care – PPO | Source: Ambulatory Visit | Attending: Urology | Admitting: Urology

## 2014-10-13 ENCOUNTER — Ambulatory Visit (HOSPITAL_COMMUNITY): Payer: BC Managed Care – PPO

## 2014-10-13 ENCOUNTER — Encounter (HOSPITAL_COMMUNITY)
Admission: RE | Disposition: A | Discharge status: Home / Self Care | Payer: Self-pay | Source: Ambulatory Visit | Attending: Urology

## 2014-10-13 ENCOUNTER — Encounter (HOSPITAL_COMMUNITY): Payer: Self-pay | Admitting: General Practice

## 2014-10-13 DIAGNOSIS — E039 Hypothyroidism, unspecified: Secondary | ICD-10-CM | POA: Diagnosis not present

## 2014-10-13 DIAGNOSIS — E559 Vitamin D deficiency, unspecified: Secondary | ICD-10-CM | POA: Insufficient documentation

## 2014-10-13 DIAGNOSIS — E785 Hyperlipidemia, unspecified: Secondary | ICD-10-CM | POA: Insufficient documentation

## 2014-10-13 DIAGNOSIS — F419 Anxiety disorder, unspecified: Secondary | ICD-10-CM | POA: Insufficient documentation

## 2014-10-13 DIAGNOSIS — N200 Calculus of kidney: Secondary | ICD-10-CM | POA: Insufficient documentation

## 2014-10-13 DIAGNOSIS — K219 Gastro-esophageal reflux disease without esophagitis: Secondary | ICD-10-CM | POA: Insufficient documentation

## 2014-10-13 HISTORY — DX: Umbilical hernia without obstruction or gangrene: K42.9

## 2014-10-13 SURGERY — LITHOTRIPSY, ESWL
Anesthesia: LOCAL | Laterality: Right

## 2014-10-13 MED ORDER — DIPHENHYDRAMINE HCL 25 MG PO CAPS
25.0000 mg | ORAL_CAPSULE | ORAL | Status: AC
  Administered 2014-10-13: 25 mg via ORAL
  Filled 2014-10-13: qty 1

## 2014-10-13 MED ORDER — DIAZEPAM 5 MG PO TABS
10.0000 mg | ORAL_TABLET | ORAL | Status: AC
  Administered 2014-10-13: 10 mg via ORAL
  Filled 2014-10-13: qty 2

## 2014-10-13 MED ORDER — CIPROFLOXACIN HCL 500 MG PO TABS
500.0000 mg | ORAL_TABLET | ORAL | Status: AC
  Administered 2014-10-13: 500 mg via ORAL
  Filled 2014-10-13: qty 1

## 2014-10-13 MED ORDER — DEXTROSE-NACL 5-0.45 % IV SOLN
INTRAVENOUS | Status: DC
  Administered 2014-10-13: 07:00:00 via INTRAVENOUS

## 2014-10-13 NOTE — Interval H&P Note (Signed)
History and Physical Interval Note:  10/13/2014 7:40 AM  Monique Thomas  has presented today for surgery, with the diagnosis of RIGHT MID RENAL STONE  The various methods of treatment have been discussed with the patient and family. After consideration of risks, benefits and other options for treatment, the patient has consented to  Procedure(s): RIGHT EXTRACORPOREAL SHOCK WAVE LITHOTRIPSY (ESWL) (Right) as a surgical intervention .  The patient's history has been reviewed, patient examined, no change in status, stable for surgery.  I have reviewed the patient's chart and labs.  Questions were answered to the patient's satisfaction.     Carolan Clines I

## 2014-10-13 NOTE — Discharge Instructions (Signed)
Kidney Stones Kidney stones (urolithiasis) are solid masses that form inside your kidneys. The intense pain is caused by the stone moving through the kidney, ureter, bladder, and urethra (urinary tract). When the stone moves, the ureter starts to spasm around the stone. The stone is usually passed in your pee (urine).  HOME CARE  Drink enough fluids to keep your pee clear or pale yellow. This helps to get the stone out.  Strain all pee through the provided strainer. Do not pee without peeing through the strainer, not even once. If you pee the stone out, catch it in the strainer. The stone may be as small as a grain of salt. Take this to your doctor. This will help your doctor figure out what you can do to try to prevent more kidney stones.  Only take medicine as told by your doctor.  Follow up with your doctor as told.  Get follow-up X-rays as told by your doctor. GET HELP IF: You have pain that gets worse even if you have been taking pain medicine. GET HELP RIGHT AWAY IF:   Your pain does not get better with medicine.  You have a fever or shaking chills.  Your pain increases and gets worse over 18 hours.  You have new belly (abdominal) pain.  You feel faint or pass out.  You are unable to pee. MAKE SURE YOU:   Understand these instructions.  Will watch your condition.  Will get help right away if you are not doing well or get worse. Document Released: 02/22/2008 Document Revised: 05/08/2013 Document Reviewed: 02/06/2013 [facility] Patient Information 2015 [place], [place]. This information is not intended to replace advice given to you by your health care provider. Make sure you discuss any questions you have with your health care provider.  Lithotripsy for Kidney Stones Lithotripsy is a treatment that can sometimes help eliminate kidney stones and pain that they cause. A form of lithotripsy, also known as extracorporeal shock wave lithotripsy, is a nonsurgical procedure that  helps your body rid itself of the kidney stone when it is too big to pass on its own. Extracorporeal shock wave lithotripsy is a method of crushing a kidney stone with shock waves. These shock waves pass through your body and are focused on your stone. They cause the kidney stones to crumble while still in the urinary tract. It is then easier for the smaller pieces of stone to pass in the urine. Lithotripsy usually takes about an hour. It is done in a hospital, a lithotripsy center, or a mobile unit. It usually does not require an overnight stay. Your health care provider will instruct you on preparation for the procedure. Your health care provider will tell you what to expect afterward. LET [facility] CARE PROVIDER KNOW ABOUT:  Any allergies you have.  All medicines you are taking, including vitamins, herbs, eye drops, creams, and over-the-counter medicines.  Previous problems you or members of your family have had with the use of anesthetics.  Any blood disorders you have.  Previous surgeries you have had.  Medical conditions you have. RISKS AND COMPLICATIONS Generally, lithotripsy for kidney stones is a safe procedure. However, as with any procedure, complications can occur. Possible complications include:  Infection.  Bleeding of the kidney.  Bruising of the kidney or skin.  Obstruction of the ureter.  Failure of the stone to fragment. BEFORE THE PROCEDURE  Do not eat or drink for 6-8 hours prior to the procedure. You may, however, take the medications  with a sip of water that your physician instructs you to take  Do not take aspirin or aspirin-containing products for 7 days prior to your procedure  Do not take nonsteroidal anti-inflammatory products for 7 days prior to your procedure PROCEDURE A stent (flexible tube with holes) may be placed in your ureter. The ureter is the tube that transports the urine from the kidneys to the bladder. Your health care provider may place a  stent before the procedure. This will help keep urine flowing from the kidney if the fragments of the stone block the ureter. You may have an IV tube placed in one of your veins to give you fluids and medicines. These medicines may help you relax or make you sleep. During the procedure, you will lie comfortably on a fluid-filled cushion or in a warm-water bath. After an X-ray or ultrasound exam to locate your stone, shock waves are aimed at the stone. If you are awake, you may feel a tapping sensation as the shock waves pass through your body. If large stone particles remain after treatment, a second procedure may be necessary at a later date. For comfort during the test:  Relax as much as possible.  Try to remain still as much as possible.  Try to follow instructions to speed up the test.  Let your health care provider know if you are uncomfortable, anxious, or in pain. AFTER THE PROCEDURE  After surgery, you will be taken to the recovery area. A nurse will watch and check your progress. Once you're awake, stable, and taking fluids well, you will be allowed to go home as long as there are no problems. You will also be allowed to pass your urine before discharge.You may be given antibiotics to help prevent infection. You may also be prescribed pain medicine if needed. In a week or two, your health care provider may remove your stent, if you have one. You may first have an X-ray exam to check on how successful the fragmentation of your stone has been and how much of the stone has passed. Your health care provider will check to see whether or not stone particles remain. SEEK IMMEDIATE MEDICAL CARE IF:  You develop a fever or shaking chills.  Your pain is not relieved by medicine.  You feel sick to your stomach (nauseated) and you vomit.  You develop heavy bleeding.  You have difficulty urinating.  You start to pass your stent from your penis. Document Released: 09/02/2000 Document Revised:  06/26/2013 Document Reviewed: 03/21/2013 [facility] Patient Information 2015 [place], [place]. This information is not intended to replace advice given to you by your health care provider. Make sure you discuss any questions you have with your health care provider.

## 2014-10-23 ENCOUNTER — Other Ambulatory Visit: Payer: Self-pay | Admitting: *Deleted

## 2014-10-23 ENCOUNTER — Encounter: Payer: Self-pay | Admitting: Internal Medicine

## 2014-10-23 ENCOUNTER — Ambulatory Visit (INDEPENDENT_AMBULATORY_CARE_PROVIDER_SITE_OTHER): Payer: BC Managed Care – PPO | Admitting: Internal Medicine

## 2014-10-23 VITALS — BP 116/68 | HR 92 | Temp 97.9°F | Resp 16 | Ht 61.0 in | Wt 122.2 lb

## 2014-10-23 DIAGNOSIS — M5442 Lumbago with sciatica, left side: Secondary | ICD-10-CM

## 2014-10-23 MED ORDER — PREDNISONE 20 MG PO TABS: ORAL_TABLET | ORAL | Status: DC

## 2014-10-23 MED ORDER — ALPRAZOLAM 1 MG PO TABS: ORAL_TABLET | ORAL | Status: DC

## 2014-10-23 NOTE — Progress Notes (Signed)
   Subjective:    Patient ID: Monique Thomas, female    DOB: [birth year], 64 y.o.   MRN: [id number]  HPI thi s very nice 64 yo recently widowed WF w/hx/o DDD chronic lumbago followed by Dr Ronnald Ramp for EDSI's had recent lithotripsy by Dr Gaynelle Arabian. Now she relates Left LBP with left sciatica over the last 3 weeks. Denies bladder retention issues or footdrop. Medication Sig  . cholecalciferol (VITAMIN D) 1000 UNITS tablet Take 1,000 Units by mouth 2 (two) times daily.   Marland Kitchen estradiol (ESTRACE) 0.5 MG tablet Take 0.5 mg by mouth daily.  Marland Kitchen levothyroxine (SYNTHROID, LEVOTHROID) 88 MCG tablet Take 88 mcg by mouth daily before breakfast. Brand name only 1 QD  . Lido-Capsaicin-Men-Methyl Sal 0.5-0.035-5-20 % PTCH Apply topically. Pt unsure of dose  . Multiple Vitamins-Minerals (MULTIVITAMIN WITH MINERALS) tablet Take 1 tablet by mouth daily.  . traMADol (ULTRAM) 50 MG tablet Take 1 tablet (50 mg total) by mouth every 6 (six) hours as needed for moderate pain or severe pain.  Marland Kitchen ALPRAZolam (XANAX) 1 MG tablet TAKE 1/2-1 TABLET BY MOUTH THREE TIMES DAILY AS NEEDED FOR ANXIETY OR SLEEP  . Magnesium 250 MG TABS Take by mouth.   Allergies  Allergen Reactions  . Celebrex [Celecoxib]     "paniac attack"  . Topamax [Topiramate]     MEMORY  . Entex Lq [Phenylephrine-Guaifenesin] Rash  . Fiorinal [Butalbital-Aspirin-Caffeine] Rash   Past Medical History  Diagnosis Date  . Hyperlipidemia   . Thyroid disease   . GERD (gastroesophageal reflux disease)   . Anemia   . Anxiety   . Back pain, chronic   . Unspecified vitamin D deficiency   . Kidney stone     2015  . Umbilical hernia    Review of Systems 10 pt systems review otherwise negative except as above.    Objective:   Physical Exam BP 116/68 mmHg  Pulse 92  Temp(Src) 97.9 F (36.6 C)  Resp 16  Ht 5\' 1"  (1.549 m)  Wt 122 lb 3.2 oz (55.43 kg)  BMI 23.10 kg/m2  HEENT - Eac's patent. TM's Nl. EOM's full. PERRLA. NasoOroPharynx clear. Neck -  supple. Nl Thyroid. Carotids 2+ & No bruits, nodes, JVD Chest - Clear equal BS w/o Rales, rhonchi, wheezes. Cor - Nl HS. RRR w/o sig MGR. PP 1(+). No edema. Abd - No palpable organomegaly, masses or tenderness. BS nl. MS- FROM w/o deformities. Muscle power, tone and bulk Nl. Gait Nl. Tender left>right para lumbar spasm.  Neuro - No obvious Cr N abnormalities. Sensory, motor and Cerebellar functions appear Nl w/o focal abnormalities. (+) SLR on the left.  Psyche - Mental status normal & appropriate.  No delusions, ideations or obvious mood abnormalities.    Assessment & Plan:   1. Left-sided low back pain with left-sided sciatica  - Rx- Prednisone pulse/taper  - Advised f/u with Dr Ronnald Ramp if not improve

## 2014-10-23 NOTE — Patient Instructions (Signed)
Sciatica Sciatica is pain, weakness, numbness, or tingling along the path of the sciatic nerve. The nerve starts in the lower back and runs down the back of each leg. The nerve controls the muscles in the lower leg and in the back of the knee, while also providing sensation to the back of the thigh, lower leg, and the sole of your foot. Sciatica is a symptom of another medical condition. For instance, nerve damage or certain conditions, such as a herniated disk or bone spur on the spine, pinch or put pressure on the sciatic nerve. This causes the pain, weakness, or other sensations normally associated with sciatica. Generally, sciatica only affects one side of the body. CAUSES   Herniated or slipped disc.  Degenerative disk disease.  A pain disorder involving the narrow muscle in the buttocks (piriformis syndrome).  Pelvic injury or fracture.  Pregnancy.  Tumor (rare). SYMPTOMS  Symptoms can vary from mild to very severe. The symptoms usually travel from the low back to the buttocks and down the back of the leg. Symptoms can include:  Mild tingling or dull aches in the lower back, leg, or hip.  Numbness in the back of the calf or sole of the foot.  Burning sensations in the lower back, leg, or hip.  Sharp pains in the lower back, leg, or hip.  Leg weakness.  Severe back pain inhibiting movement. These symptoms may get worse with coughing, sneezing, laughing, or prolonged sitting or standing. Also, being overweight may worsen symptoms. DIAGNOSIS  Your caregiver will perform a physical exam to look for common symptoms of sciatica. He or she may ask you to do certain movements or activities that would trigger sciatic nerve pain. Other tests may be performed to find the cause of the sciatica. These may include:  Blood tests.  X-rays.  Imaging tests, such as an MRI or CT scan. TREATMENT  Treatment is directed at the cause of the sciatic pain. Sometimes, treatment is not necessary  and the pain and discomfort goes away on its own. If treatment is needed, your caregiver may suggest:  Over-the-counter medicines to relieve pain.  Prescription medicines, such as anti-inflammatory medicine, muscle relaxants, or narcotics.  Applying heat or ice to the painful area.  Steroid injections to lessen pain, irritation, and inflammation around the nerve.  Reducing activity during periods of pain.  Exercising and stretching to strengthen your abdomen and improve flexibility of your spine. Your caregiver may suggest losing weight if the extra weight makes the back pain worse.  Physical therapy.  Surgery to eliminate what is pressing or pinching the nerve, such as a bone spur or part of a herniated disk. HOME CARE INSTRUCTIONS   Only take over-the-counter or prescription medicines for pain or discomfort as directed by your caregiver.  Apply ice to the affected area for 20 minutes, 3-4 times a day for the first 48-72 hours. Then try heat in the same way.  Exercise, stretch, or perform your usual activities if these do not aggravate your pain.  Attend physical therapy sessions as directed by your caregiver.  Keep all follow-up appointments as directed by your caregiver.  Do not wear high heels or shoes that do not provide proper support.  Check your mattress to see if it is too soft. A firm mattress may lessen your pain and discomfort. SEEK IMMEDIATE MEDICAL CARE IF:   You lose control of your bowel or bladder (incontinence).  You have increasing weakness in the lower back, pelvis, buttocks,   or legs.  You have redness or swelling of your back.  You have a burning sensation when you urinate.  You have pain that gets worse when you lie down or awakens you at night.  Your pain is worse than you have experienced in the past.  Your pain is lasting longer than 4 weeks.  You are suddenly losing weight without reason. MAKE SURE YOU:  Understand these  instructions.  Will watch your condition.  Will get help right away if you are not doing well or get worse. Document Released: 08/30/2001 Document Revised: 03/06/2012 Document Reviewed: 01/15/2012 [employer] Patient Information 2015 [employer]. This information is not intended to replace advice given to you by your health care provider. Make sure you discuss any questions you have with your health care provider.  

## 2014-12-25 ENCOUNTER — Other Ambulatory Visit (INDEPENDENT_AMBULATORY_CARE_PROVIDER_SITE_OTHER): Payer: BC Managed Care – PPO

## 2014-12-25 DIAGNOSIS — Z0000 Encounter for general adult medical examination without abnormal findings: Secondary | ICD-10-CM

## 2014-12-25 DIAGNOSIS — Z1212 Encounter for screening for malignant neoplasm of rectum: Secondary | ICD-10-CM

## 2014-12-25 LAB — POC HEMOCCULT BLD/STL (HOME/3-CARD/SCREEN)
FECAL OCCULT BLD: NEGATIVE
FECAL OCCULT BLD: NEGATIVE
FECAL OCCULT BLD: NEGATIVE

## 2015-01-01 ENCOUNTER — Encounter: Payer: Self-pay | Admitting: Internal Medicine

## 2015-01-01 ENCOUNTER — Ambulatory Visit (INDEPENDENT_AMBULATORY_CARE_PROVIDER_SITE_OTHER): Payer: BC Managed Care – PPO | Admitting: Internal Medicine

## 2015-01-01 VITALS — BP 122/66 | HR 84 | Temp 97.7°F | Resp 16 | Ht 61.0 in | Wt 121.6 lb

## 2015-01-01 DIAGNOSIS — N3289 Other specified disorders of bladder: Secondary | ICD-10-CM

## 2015-01-01 DIAGNOSIS — R002 Palpitations: Secondary | ICD-10-CM

## 2015-01-01 DIAGNOSIS — M549 Dorsalgia, unspecified: Secondary | ICD-10-CM

## 2015-01-01 DIAGNOSIS — E039 Hypothyroidism, unspecified: Secondary | ICD-10-CM

## 2015-01-01 DIAGNOSIS — G8929 Other chronic pain: Secondary | ICD-10-CM

## 2015-01-01 LAB — TSH: TSH: 1.036 u[IU]/mL (ref 0.350–4.500)

## 2015-01-01 MED ORDER — CIPROFLOXACIN HCL 500 MG PO TABS: ORAL_TABLET | ORAL | Status: DC

## 2015-01-01 MED ORDER — GABAPENTIN 300 MG PO CAPS: ORAL_CAPSULE | ORAL | Status: DC

## 2015-01-01 NOTE — Progress Notes (Signed)
Subjective:    Patient ID: Monique Thomas, female    DOB: 1951-03-18, 64 y.o.   MRN: [id number]  HPI 64 yo MWF with chronic back pain due to DD had an episode of palpitations and transient dizziness last evening, felt chilled & flushed in her face and had urinary incontinance. No fever or Gross rigors. No Respiratory or Gi sx's. No rash. Had recent back injection at her pain clinic.   Medication Sig  . ALPRAZolam (XANAX) 1 MG tablet TAKE 1/2-1 TABLET BY MOUTH THREE TIMES DAILY AS NEEDED FOR ANXIETY OR SLEEP  . cholecalciferol (VITAMIN D) 1000 UNITS tablet Take 1,000 Units by mouth 2 (two) times daily.   Marland Kitchen estradiol (ESTRACE) 0.5 MG tablet Take 0.5 mg by mouth daily.  Marland Kitchen levothyroxine 88 MCG tablet Take 88 mcg by mouth daily before breakfast. Brand name only 1 QD  . Lido-Capsaicin-Men-Methyl Sal 0.5-0.035-5-20 % PTCH Apply topically. Pt unsure of dose  . Magnesium 500 MG CAPS Take 2 capsules by mouth daily.  . Multiple Vitamins-Minerals (MULTIVITAMIN WITH MINERALS) tablet Take 1 tablet by mouth daily.  Marland Kitchen PERCOCET 5-325 MG per tablet   . gabapentin 300 MG capsule   . traMADol (ULTRAM) 50 MG tablet Take 1 tablet (50 mg total) by mouth every 6 (six) hours as needed for moderate pain or severe pain.   Allergies  Allergen Reactions  . Celebrex [Celecoxib]     "paniac attack"  . Topamax [Topiramate]     MEMORY  . Entex Lq [Phenylephrine-Guaifenesin] Rash  . Fiorinal [Butalbital-Aspirin-Caffeine] Rash   Past Medical History  Diagnosis Date  . Hyperlipidemia   . Thyroid disease   . GERD (gastroesophageal reflux disease)   . Anemia   . Anxiety   . Back pain, chronic   . Unspecified vitamin D deficiency   . Kidney stone     2015  . Umbilical hernia    Review of Systems In addition to the HPI above,  No Fever-chills,  No Headache, No changes with Vision or hearing,  No problems swallowing food or Liquids,  No Chest pain or productive Cough or Shortness of Breath,  (+) palpitations  as above, No Abdominal pain, No Nausea or Vomitting, Bowel movements are regular,  No Blood in stool or Urine,  No dysuria,  No new skin rashes or bruises,  No new joints pains-aches,  No new weakness, tingling, numbness in any extremity,  No recent weight loss,  No polyuria, polydypsia or polyphagia,  No significant Mental Stressors.  A full 10 point Review of Systems was done, except as stated above, all other Review of Systems were negative    Objective:   Physical Exam  BP 122/66 mmHg  Pulse 84  Temp(Src) 97.7 F (36.5 C)  Resp 16  Ht 5\' 1"  (1.549 m)  Wt 121 lb 9.6 oz (55.157 kg)  BMI 22.99 kg/m2  HEENT - Eac's patent. TM's Nl. EOM's full. PERRLA. NasoOroPharynx clear. Neck - supple. Nl Thyroid. Carotids 2+ & No bruits, nodes, JVD Chest - Clear equal BS w/o Rales, rhonchi, wheezes. Cor - Nl HS. RRR w/o sig MGR. PP 1(+). No edema. Abd - No palpable organomegaly, masses or tenderness. BS nl. MS- FROM w/o deformities. Muscle power, tone and bulk Nl. Gait Nl. Neuro - No obvious Cr N abnormalities. Sensory, motor and Cerebellar functions appear Nl w/o focal abnormalities. Psyche - Mental status normal & appropriate.  No delusions, ideations or obvious mood abnormalities.    Assessment & Plan:  1. Palpitations  - TSH - EKG 12-Lead  2. Bladder spasms  - Urine Microscopic - Urine culture - ciprofloxacin (CIPRO) 500 MG tablet; Take 1 tablet 2 x daily with food pending U/C  3. Hypothyroidism  - TSH  4. Chronic back pain  - gabapentin (NEURONTIN) 300 MG capsule; Take 1 tablet 3 x day for pain  Dispense: 90 capsule; Refill: 3

## 2015-01-02 LAB — URINE CULTURE
Colony Count: NO GROWTH
ORGANISM ID, BACTERIA: NO GROWTH

## 2015-01-02 LAB — URINALYSIS, MICROSCOPIC ONLY
Bacteria, UA: NONE SEEN
CASTS: NONE SEEN
Crystals: NONE SEEN
Squamous Epithelial / LPF: NONE SEEN

## 2015-01-15 ENCOUNTER — Encounter: Payer: Self-pay | Admitting: Internal Medicine

## 2015-01-16 ENCOUNTER — Other Ambulatory Visit: Payer: Self-pay | Admitting: Internal Medicine

## 2015-01-16 ENCOUNTER — Encounter: Payer: Self-pay | Admitting: *Deleted

## 2015-01-30 ENCOUNTER — Other Ambulatory Visit: Payer: Self-pay | Admitting: Neurological Surgery

## 2015-01-30 DIAGNOSIS — M545 Low back pain: Secondary | ICD-10-CM

## 2015-02-18 ENCOUNTER — Other Ambulatory Visit: Payer: BC Managed Care – PPO

## 2015-02-20 ENCOUNTER — Ambulatory Visit
Admission: RE | Admit: 2015-02-20 | Discharge: 2015-02-20 | Disposition: A | Discharge status: Home / Self Care | Payer: BC Managed Care – PPO | Source: Ambulatory Visit | Attending: Neurological Surgery | Admitting: Neurological Surgery

## 2015-02-20 DIAGNOSIS — M545 Low back pain: Secondary | ICD-10-CM

## 2015-03-24 ENCOUNTER — Other Ambulatory Visit: Payer: Self-pay | Admitting: Neurological Surgery

## 2015-03-27 ENCOUNTER — Ambulatory Visit (INDEPENDENT_AMBULATORY_CARE_PROVIDER_SITE_OTHER): Payer: BC Managed Care – PPO | Admitting: Physician Assistant

## 2015-03-27 ENCOUNTER — Encounter: Payer: Self-pay | Admitting: Physician Assistant

## 2015-03-27 ENCOUNTER — Other Ambulatory Visit: Payer: Self-pay | Admitting: Physician Assistant

## 2015-03-27 VITALS — BP 110/68 | HR 80 | Temp 97.8°F | Resp 16 | Ht 60.5 in | Wt 120.0 lb

## 2015-03-27 DIAGNOSIS — E785 Hyperlipidemia, unspecified: Secondary | ICD-10-CM

## 2015-03-27 DIAGNOSIS — R7309 Other abnormal glucose: Secondary | ICD-10-CM | POA: Diagnosis not present

## 2015-03-27 DIAGNOSIS — Z79899 Other long term (current) drug therapy: Secondary | ICD-10-CM

## 2015-03-27 DIAGNOSIS — E039 Hypothyroidism, unspecified: Secondary | ICD-10-CM | POA: Diagnosis not present

## 2015-03-27 DIAGNOSIS — M549 Dorsalgia, unspecified: Secondary | ICD-10-CM

## 2015-03-27 DIAGNOSIS — R7303 Prediabetes: Secondary | ICD-10-CM

## 2015-03-27 DIAGNOSIS — E559 Vitamin D deficiency, unspecified: Secondary | ICD-10-CM | POA: Diagnosis not present

## 2015-03-27 DIAGNOSIS — G894 Chronic pain syndrome: Secondary | ICD-10-CM | POA: Insufficient documentation

## 2015-03-27 DIAGNOSIS — G8929 Other chronic pain: Secondary | ICD-10-CM

## 2015-03-27 LAB — CBC WITH DIFFERENTIAL/PLATELET
Basophils Absolute: 0 10*3/uL (ref 0.0–0.1)
Basophils Relative: 0 % (ref 0–1)
EOS PCT: 3 % (ref 0–5)
Eosinophils Absolute: 0.2 10*3/uL (ref 0.0–0.7)
HEMATOCRIT: 36.8 % (ref 36.0–46.0)
Hemoglobin: 12.7 g/dL (ref 12.0–15.0)
Lymphocytes Relative: 38 % (ref 12–46)
Lymphs Abs: 1.9 10*3/uL (ref 0.7–4.0)
MCH: 30.9 pg (ref 26.0–34.0)
MCHC: 34.5 g/dL (ref 30.0–36.0)
MCV: 89.5 fL (ref 78.0–100.0)
MONOS PCT: 7 % (ref 3–12)
MPV: 8 fL — ABNORMAL LOW (ref 8.6–12.4)
Monocytes Absolute: 0.4 10*3/uL (ref 0.1–1.0)
Neutro Abs: 2.6 10*3/uL (ref 1.7–7.7)
Neutrophils Relative %: 52 % (ref 43–77)
Platelets: 338 10*3/uL (ref 150–400)
RBC: 4.11 MIL/uL (ref 3.87–5.11)
RDW: 13.6 % (ref 11.5–15.5)
WBC: 5 10*3/uL (ref 4.0–10.5)

## 2015-03-27 LAB — HEMOGLOBIN A1C
HEMOGLOBIN A1C: 5.5 % (ref <5.7)
Mean Plasma Glucose: 111 mg/dL (ref <117)

## 2015-03-27 MED ORDER — ALPRAZOLAM 1 MG PO TABS: ORAL_TABLET | ORAL | Status: DC

## 2015-03-27 NOTE — Patient Instructions (Addendum)

## 2015-03-27 NOTE — Progress Notes (Signed)
Assessment and Plan:  1. Hypertension - Continue medication, monitor blood pressure at home.  - Continue DASH diet.   - Reminder to go to the ER if any CP, SOB, nausea, dizziness, severe HA, changes vision/speech, left arm numbness and tingling and jaw pain.  2. Cholesterol -Continue diet and exercise. Check cholesterol.   3. Prediabetes  -Continue diet and exercise. Check A1C  4. Vitamin D Def - check level and continue medications.   5. Hypothyroidism -check TSH level, continue medications the same.  - reminded to take on an empty stomach 30-47mins before food.   6. Lower back pain Surgical clearance today, she is low risk, no CP/SOB, monitor BP post op and DVT prophylaxis.    Continue diet and meds as discussed. Further disposition pending results of labs. Over 30 minutes of exam, counseling, chart review, and critical decision making was performed  HPI 64 y.o. female  presents for 3 month follow up on hypertension, cholesterol, prediabetes, and vitamin D deficiency.   Her blood pressure has been controlled at home, today their BP is BP: 110/68 mmHg  She does not workout due to chronic pain. She denies chest pain, shortness of breath, dizziness.  She is not on cholesterol medication and denies myalgias. Her cholesterol is at goal. The cholesterol last visit was:   Lab Results  Component Value Date   CHOL 174 10/01/2014   HDL 73 10/01/2014   LDLCALC 83 10/01/2014   TRIG 91 10/01/2014   CHOLHDL 2.4 10/01/2014    She has been working on diet and exercise for prediabetes, and denies paresthesia of the feet, polydipsia, polyuria and visual disturbances. Last A1C in the office was:  Lab Results  Component Value Date   HGBA1C 5.5 10/01/2014   Patient is on Vitamin D supplement.   Lab Results  Component Value Date   VD25OH 35 10/01/2014     She is on thyroid medication. Her medication was not changed last visit.   Lab Results  Component Value Date   TSH 1.036 01/01/2015   .  She has chronic lower back pain, follows with pain management. She is on gabapentin and oxycodone. She is having lumbar fusion surgery with Dr. Sherley Bounds on July 28th due to pain, states she has pain at rest and with movement, it is affecting her sleep and quality of life.  She has recurrent UTI's and bladder spasms, follows with urology. Just recently treated with cipro.  She has depression and is on xanax PRN, declines SSRI/maintenance medication.   Current Medications:  Current Outpatient Prescriptions on File Prior to Visit  Medication Sig Dispense Refill  . ALPRAZolam (XANAX) 1 MG tablet TAKE 1/2-1 TABLET BY MOUTH THREE TIMES DAILY AS NEEDED FOR ANXIETY OR SLEEP 90 tablet 1  . cholecalciferol (VITAMIN D) 1000 UNITS tablet Take 1,000 Units by mouth 2 (two) times daily.     Marland Kitchen estradiol (ESTRACE) 0.5 MG tablet Take 0.5 mg by mouth daily.    Marland Kitchen levothyroxine (SYNTHROID, LEVOTHROID) 88 MCG tablet Take 88 mcg by mouth daily before breakfast. Brand name only 1 QD    . Lido-Capsaicin-Men-Methyl Sal 0.5-0.035-5-20 % PTCH Apply topically. Pt unsure of dose    . Magnesium 500 MG CAPS Take 2 capsules by mouth daily.    . Multiple Vitamins-Minerals (MULTIVITAMIN WITH MINERALS) tablet Take 1 tablet by mouth daily.    Marland Kitchen oxyCODONE-acetaminophen (PERCOCET/ROXICET) 5-325 MG per tablet   0   No current facility-administered medications on file prior to visit.  Medical History:  Past Medical History  Diagnosis Date  . Hyperlipidemia   . Thyroid disease   . GERD (gastroesophageal reflux disease)   . Anemia   . Anxiety   . Back pain, chronic   . Unspecified vitamin D deficiency   . Kidney stone     2015  . Umbilical hernia    Allergies:  Allergies  Allergen Reactions  . Celebrex [Celecoxib]     "paniac attack"  . Topamax [Topiramate]     MEMORY  . Entex Lq [Phenylephrine-Guaifenesin] Rash  . Fiorinal [Butalbital-Aspirin-Caffeine] Rash    Review of Systems:  Review of Systems   Constitutional: Negative for fever, chills and diaphoresis.  HENT: Negative.   Eyes: Negative.   Respiratory: Negative.   Cardiovascular: Negative.   Gastrointestinal: Positive for abdominal pain. Negative for heartburn, nausea, vomiting, diarrhea, constipation and blood in stool.  Genitourinary: Negative.   Musculoskeletal: Positive for back pain.  Skin: Negative.  Negative for rash.  Neurological: Negative.  Negative for dizziness and headaches.  Psychiatric/Behavioral: The patient is nervous/anxious.     Family history- Review and unchanged Social history- Review and unchanged Physical Exam: BP 110/68 mmHg  Pulse 80  Temp(Src) 97.8 F (36.6 C)  Resp 16  Ht 5' 0.5" (1.537 m)  Wt 120 lb (54.432 kg)  BMI 23.04 kg/m2 Wt Readings from Last 3 Encounters:  03/27/15 120 lb (54.432 kg)  01/01/15 121 lb 9.6 oz (55.157 kg)  10/23/14 122 lb 3.2 oz (55.43 kg)   General Appearance: Well nourished, in no apparent distress. Eyes: PERRLA, EOMs, conjunctiva no swelling or erythema Sinuses: No Frontal/maxillary tenderness ENT/Mouth: Ext aud canals clear, TMs without erythema, bulging. No erythema, swelling, or exudate on post pharynx.  Tonsils not swollen or erythematous. Hearing normal.  Neck: Supple, thyroid normal.  Respiratory: Respiratory effort normal, BS equal bilaterally without rales, rhonchi, wheezing or stridor.  Cardio: RRR with no MRGs. Brisk peripheral pulses without edema.  Abdomen: Soft, + BS,  Non tender, no guarding, rebound, hernias, masses. Lymphatics: Non tender without lymphadenopathy.  Musculoskeletal: Full ROM, 5/5 strength, Antalgic gait Skin: Warm, dry without rashes, lesions, ecchymosis.  Neuro: Cranial nerves intact. Normal muscle tone, no cerebellar symptoms. Psych: Awake and oriented X 3, normal affect, Insight and Judgment appropriate.    Vicie Mutters, PA-C 8:46 AM [facility]

## 2015-03-28 LAB — HEPATIC FUNCTION PANEL
ALT: 8 U/L (ref 0–35)
AST: 16 U/L (ref 0–37)
Albumin: 4.2 g/dL (ref 3.5–5.2)
Alkaline Phosphatase: 47 U/L (ref 39–117)
BILIRUBIN INDIRECT: 0.5 mg/dL (ref 0.2–1.2)
Bilirubin, Direct: 0.1 mg/dL (ref 0.0–0.3)
Total Bilirubin: 0.6 mg/dL (ref 0.2–1.2)
Total Protein: 6.6 g/dL (ref 6.0–8.3)

## 2015-03-28 LAB — BASIC METABOLIC PANEL WITH GFR
BUN: 17 mg/dL (ref 6–23)
CO2: 23 meq/L (ref 19–32)
Calcium: 9 mg/dL (ref 8.4–10.5)
Chloride: 105 mEq/L (ref 96–112)
Creat: 0.67 mg/dL (ref 0.50–1.10)
GFR, Est African American: >89 mL/min
GFR, Est Non African American: >89 mL/min
GLUCOSE: 101 mg/dL — AB (ref 70–99)
Potassium: 3.7 mEq/L (ref 3.5–5.3)
Sodium: 142 mEq/L (ref 135–145)

## 2015-03-28 LAB — VITAMIN D 25 HYDROXY (VIT D DEFICIENCY, FRACTURES): Vit D, 25-Hydroxy: 35 ng/mL (ref 30–100)

## 2015-03-28 LAB — LIPID PANEL
CHOL/HDL RATIO: 2 ratio
CHOLESTEROL: 169 mg/dL (ref 0–200)
HDL: 85 mg/dL (ref >=46)
LDL Cholesterol: 63 mg/dL (ref 0–99)
Triglycerides: 105 mg/dL (ref <150)
VLDL: 21 mg/dL (ref 0–40)

## 2015-03-28 LAB — MAGNESIUM: Magnesium: 1.7 mg/dL (ref 1.5–2.5)

## 2015-03-28 LAB — TSH: TSH: 1.806 u[IU]/mL (ref 0.350–4.500)

## 2015-03-28 LAB — INSULIN, FASTING: INSULIN FASTING, SERUM: 17.9 u[IU]/mL (ref 2.0–19.6)

## 2015-04-07 ENCOUNTER — Encounter (HOSPITAL_COMMUNITY)
Admission: RE | Admit: 2015-04-07 | Discharge: 2015-04-07 | Disposition: A | Discharge status: Home / Self Care | Payer: BC Managed Care – PPO | Source: Ambulatory Visit | Attending: Neurological Surgery | Admitting: Neurological Surgery

## 2015-04-07 ENCOUNTER — Encounter (HOSPITAL_COMMUNITY): Payer: Self-pay

## 2015-04-07 DIAGNOSIS — Z01812 Encounter for preprocedural laboratory examination: Secondary | ICD-10-CM | POA: Insufficient documentation

## 2015-04-07 DIAGNOSIS — Z0183 Encounter for blood typing: Secondary | ICD-10-CM | POA: Diagnosis not present

## 2015-04-07 DIAGNOSIS — M48061 Spinal stenosis, lumbar region without neurogenic claudication: Secondary | ICD-10-CM

## 2015-04-07 DIAGNOSIS — Z01818 Encounter for other preprocedural examination: Secondary | ICD-10-CM | POA: Insufficient documentation

## 2015-04-07 HISTORY — DX: Hypothyroidism, unspecified: E03.9

## 2015-04-07 HISTORY — DX: Unspecified osteoarthritis, unspecified site: M19.90

## 2015-04-07 HISTORY — DX: Major depressive disorder, single episode, unspecified: F32.9

## 2015-04-07 HISTORY — DX: Depression, unspecified: F32.A

## 2015-04-07 LAB — PROTIME-INR
INR: 1.07 (ref 0.00–1.49)
PROTHROMBIN TIME: 14.1 s (ref 11.6–15.2)

## 2015-04-07 LAB — CBC WITH DIFFERENTIAL/PLATELET
BASOS ABS: 0.1 10*3/uL (ref 0.0–0.1)
Basophils Relative: 1 % (ref 0–1)
Eosinophils Absolute: 0.1 10*3/uL (ref 0.0–0.7)
Eosinophils Relative: 2 % (ref 0–5)
HEMATOCRIT: 38.2 % (ref 36.0–46.0)
Hemoglobin: 13.1 g/dL (ref 12.0–15.0)
LYMPHS PCT: 43 % (ref 12–46)
Lymphs Abs: 2.3 10*3/uL (ref 0.7–4.0)
MCH: 30.8 pg (ref 26.0–34.0)
MCHC: 34.3 g/dL (ref 30.0–36.0)
MCV: 89.9 fL (ref 78.0–100.0)
MONO ABS: 0.3 10*3/uL (ref 0.1–1.0)
Monocytes Relative: 6 % (ref 3–12)
Neutro Abs: 2.6 10*3/uL (ref 1.7–7.7)
Neutrophils Relative %: 48 % (ref 43–77)
Platelets: 320 10*3/uL (ref 150–400)
RBC: 4.25 MIL/uL (ref 3.87–5.11)
RDW: 12.9 % (ref 11.5–15.5)
WBC: 5.4 10*3/uL (ref 4.0–10.5)

## 2015-04-07 LAB — BASIC METABOLIC PANEL
Anion gap: 8 (ref 5–15)
BUN: 10 mg/dL (ref 6–20)
CALCIUM: 9.3 mg/dL (ref 8.9–10.3)
CO2: 27 mmol/L (ref 22–32)
Chloride: 102 mmol/L (ref 101–111)
Creatinine, Ser: 0.66 mg/dL (ref 0.44–1.00)
GFR calc Af Amer: >60 mL/min (ref >60)
GLUCOSE: 98 mg/dL (ref 65–99)
POTASSIUM: 4 mmol/L (ref 3.5–5.1)
Sodium: 137 mmol/L (ref 135–145)

## 2015-04-07 LAB — SURGICAL PCR SCREEN
MRSA, PCR: NEGATIVE
Staphylococcus aureus: NEGATIVE

## 2015-04-07 LAB — TYPE AND SCREEN
ABO/RH(D): B POS
Antibody Screen: NEGATIVE

## 2015-04-07 NOTE — Pre-Procedure Instructions (Signed)
    Monique Thomas  04/07/2015      [facility] [address], [address] AT [address] [place] [address] Phone: [phone] Fax: [phone]    Your procedure is scheduled on 04-16-2015   Thursday .  Report to [facility] Admitting at 5:30 AM  .  Call this number if you have problems the morning of surgery:  [phone]   Remember:  Do not eat food or drink liquids after midnight.   Take these medicines the morning of surgery with A SIP OF WATER Alaprazolam(xanax) if needed,Estradiol(estrace),Synthroid,Tramadol(Ultram) if needed   Do not wear jewelry, make-up or nail polish.  Do not wear lotions, powders, or perfumes.    Do not shave 48 hours prior to surgery.    Do not bring valuables to the hospital  [facility] is not responsible for any belongings or valuables.  Contacts, dentures or bridgework may not be worn into surgery.  Leave your suitcase in the car.  After surgery it may be brought to your room.  For patients admitted to the hospital, discharge time will be determined by your treatment team.      Special instructions:  See attached sheet "Preparing for Surgery" for instructions on CHG shower/bath  Please read over the following fact sheets that you were given. Pain Booklet and Surgical Site Infection Prevention

## 2015-04-09 DIAGNOSIS — M79605 Pain in left leg: Secondary | ICD-10-CM | POA: Insufficient documentation

## 2015-04-14 ENCOUNTER — Ambulatory Visit
Admission: RE | Admit: 2015-04-14 | Discharge: 2015-04-14 | Disposition: A | Discharge status: Home / Self Care | Payer: BC Managed Care – PPO | Source: Ambulatory Visit | Attending: Neurological Surgery | Admitting: Neurological Surgery

## 2015-04-14 ENCOUNTER — Other Ambulatory Visit: Payer: Self-pay | Admitting: Neurological Surgery

## 2015-04-14 DIAGNOSIS — M79605 Pain in left leg: Secondary | ICD-10-CM

## 2015-04-15 MED ORDER — DEXAMETHASONE SODIUM PHOSPHATE 10 MG/ML IJ SOLN
10.0000 mg | INTRAMUSCULAR | Status: AC
  Administered 2015-04-16: 10 mg via INTRAVENOUS
  Filled 2015-04-15: qty 1

## 2015-04-15 MED ORDER — CEFAZOLIN SODIUM-DEXTROSE 2-3 GM-% IV SOLR
2.0000 g | INTRAVENOUS | Status: AC
  Administered 2015-04-16: 2 g via INTRAVENOUS
  Filled 2015-04-15: qty 50

## 2015-04-16 ENCOUNTER — Encounter (HOSPITAL_COMMUNITY): Payer: Self-pay | Admitting: *Deleted

## 2015-04-16 ENCOUNTER — Inpatient Hospital Stay (HOSPITAL_COMMUNITY): Payer: BC Managed Care – PPO | Admitting: Anesthesiology

## 2015-04-16 ENCOUNTER — Inpatient Hospital Stay (HOSPITAL_COMMUNITY)
Admission: RE | Admit: 2015-04-16 | Discharge: 2015-04-17 | DRG: 460 | Disposition: A | Discharge status: Home / Self Care | Payer: BC Managed Care – PPO | Source: Ambulatory Visit | Attending: Neurological Surgery | Admitting: Neurological Surgery

## 2015-04-16 ENCOUNTER — Inpatient Hospital Stay (HOSPITAL_COMMUNITY): Payer: BC Managed Care – PPO

## 2015-04-16 ENCOUNTER — Encounter (HOSPITAL_COMMUNITY)
Admission: RE | Disposition: A | Discharge status: Home / Self Care | Payer: Self-pay | Source: Ambulatory Visit | Attending: Neurological Surgery

## 2015-04-16 DIAGNOSIS — E785 Hyperlipidemia, unspecified: Secondary | ICD-10-CM | POA: Diagnosis present

## 2015-04-16 DIAGNOSIS — M5136 Other intervertebral disc degeneration, lumbar region: Secondary | ICD-10-CM | POA: Diagnosis present

## 2015-04-16 DIAGNOSIS — F419 Anxiety disorder, unspecified: Secondary | ICD-10-CM | POA: Diagnosis present

## 2015-04-16 DIAGNOSIS — K219 Gastro-esophageal reflux disease without esophagitis: Secondary | ICD-10-CM | POA: Diagnosis present

## 2015-04-16 DIAGNOSIS — E559 Vitamin D deficiency, unspecified: Secondary | ICD-10-CM | POA: Diagnosis present

## 2015-04-16 DIAGNOSIS — Z79899 Other long term (current) drug therapy: Secondary | ICD-10-CM

## 2015-04-16 DIAGNOSIS — M545 Low back pain: Secondary | ICD-10-CM | POA: Diagnosis present

## 2015-04-16 DIAGNOSIS — Z825 Family history of asthma and other chronic lower respiratory diseases: Secondary | ICD-10-CM

## 2015-04-16 DIAGNOSIS — E039 Hypothyroidism, unspecified: Secondary | ICD-10-CM | POA: Diagnosis present

## 2015-04-16 DIAGNOSIS — M4806 Spinal stenosis, lumbar region: Secondary | ICD-10-CM | POA: Diagnosis present

## 2015-04-16 DIAGNOSIS — M549 Dorsalgia, unspecified: Secondary | ICD-10-CM

## 2015-04-16 DIAGNOSIS — Z8249 Family history of ischemic heart disease and other diseases of the circulatory system: Secondary | ICD-10-CM | POA: Diagnosis not present

## 2015-04-16 DIAGNOSIS — Z981 Arthrodesis status: Secondary | ICD-10-CM

## 2015-04-16 HISTORY — PX: MAXIMUM ACCESS (MAS)POSTERIOR LUMBAR INTERBODY FUSION (PLIF) 1 LEVEL: SHX6368

## 2015-04-16 SURGERY — FOR MAXIMUM ACCESS (MAS) POSTERIOR LUMBAR INTERBODY FUSION (PLIF) 1 LEVEL
Anesthesia: General | Site: Back

## 2015-04-16 MED ORDER — FENTANYL CITRATE (PF) 250 MCG/5ML IJ SOLN
INTRAMUSCULAR | Status: AC
  Filled 2015-04-16: qty 5

## 2015-04-16 MED ORDER — KETOROLAC TROMETHAMINE 30 MG/ML IJ SOLN
INTRAMUSCULAR | Status: AC
  Filled 2015-04-16: qty 1

## 2015-04-16 MED ORDER — KETOROLAC TROMETHAMINE 15 MG/ML IJ SOLN: 15.0000 mg | Freq: Four times a day (QID) | INTRAMUSCULAR | Status: DC | PRN

## 2015-04-16 MED ORDER — DEXAMETHASONE SODIUM PHOSPHATE 4 MG/ML IJ SOLN
4.0000 mg | Freq: Four times a day (QID) | INTRAMUSCULAR | Status: DC
  Administered 2015-04-17 (×2): 4 mg via INTRAVENOUS
  Filled 2015-04-16 (×5): qty 1

## 2015-04-16 MED ORDER — LEVOTHYROXINE SODIUM 88 MCG PO TABS
88.0000 ug | ORAL_TABLET | Freq: Every day | ORAL | Status: DC
  Administered 2015-04-17: 88 ug via ORAL
  Filled 2015-04-16 (×2): qty 1

## 2015-04-16 MED ORDER — METHOCARBAMOL 500 MG PO TABS
500.0000 mg | ORAL_TABLET | Freq: Four times a day (QID) | ORAL | Status: DC | PRN
  Administered 2015-04-16 – 2015-04-17 (×2): 500 mg via ORAL
  Filled 2015-04-16 (×3): qty 1

## 2015-04-16 MED ORDER — HYDROMORPHONE HCL 1 MG/ML IJ SOLN
INTRAMUSCULAR | Status: AC
  Filled 2015-04-16: qty 1

## 2015-04-16 MED ORDER — ACETAMINOPHEN 325 MG PO TABS: 650.0000 mg | ORAL_TABLET | ORAL | Status: DC | PRN

## 2015-04-16 MED ORDER — MEPERIDINE HCL 25 MG/ML IJ SOLN: 6.2500 mg | INTRAMUSCULAR | Status: DC | PRN

## 2015-04-16 MED ORDER — ONDANSETRON HCL 4 MG/2ML IJ SOLN
4.0000 mg | INTRAMUSCULAR | Status: DC | PRN
  Administered 2015-04-16: 4 mg via INTRAVENOUS
  Filled 2015-04-16: qty 2

## 2015-04-16 MED ORDER — PROPOFOL 10 MG/ML IV BOLUS
INTRAVENOUS | Status: DC | PRN
  Administered 2015-04-16: 120 mg via INTRAVENOUS

## 2015-04-16 MED ORDER — DEXAMETHASONE 4 MG PO TABS
4.0000 mg | ORAL_TABLET | Freq: Four times a day (QID) | ORAL | Status: DC
  Administered 2015-04-16 – 2015-04-17 (×2): 4 mg via ORAL
  Filled 2015-04-16 (×6): qty 1

## 2015-04-16 MED ORDER — ACETAMINOPHEN 650 MG RE SUPP
650.0000 mg | RECTAL | Status: DC | PRN
  Filled 2015-04-16: qty 1

## 2015-04-16 MED ORDER — THROMBIN 5000 UNITS EX SOLR
OROMUCOSAL | Status: DC | PRN
  Administered 2015-04-16: 10 mL via TOPICAL

## 2015-04-16 MED ORDER — FENTANYL CITRATE (PF) 250 MCG/5ML IJ SOLN
INTRAMUSCULAR | Status: DC | PRN
  Administered 2015-04-16: 100 ug via INTRAVENOUS
  Administered 2015-04-16: 150 ug via INTRAVENOUS
  Administered 2015-04-16: 100 ug via INTRAVENOUS

## 2015-04-16 MED ORDER — CEFAZOLIN SODIUM 1-5 GM-% IV SOLN
1.0000 g | Freq: Three times a day (TID) | INTRAVENOUS | Status: AC
  Administered 2015-04-16 – 2015-04-17 (×2): 1 g via INTRAVENOUS
  Filled 2015-04-16 (×2): qty 50

## 2015-04-16 MED ORDER — OXYCODONE-ACETAMINOPHEN 5-325 MG PO TABS
ORAL_TABLET | ORAL | Status: AC
  Filled 2015-04-16: qty 1

## 2015-04-16 MED ORDER — SENNA 8.6 MG PO TABS
1.0000 | ORAL_TABLET | Freq: Two times a day (BID) | ORAL | Status: DC
  Administered 2015-04-16 – 2015-04-17 (×3): 8.6 mg via ORAL
  Filled 2015-04-16 (×4): qty 1

## 2015-04-16 MED ORDER — 0.9 % SODIUM CHLORIDE (POUR BTL) OPTIME
TOPICAL | Status: DC | PRN
  Administered 2015-04-16: 1000 mL

## 2015-04-16 MED ORDER — METHOCARBAMOL 1000 MG/10ML IJ SOLN
500.0000 mg | Freq: Four times a day (QID) | INTRAMUSCULAR | Status: DC | PRN
  Administered 2015-04-16: 500 mg via INTRAVENOUS
  Filled 2015-04-16 (×2): qty 5

## 2015-04-16 MED ORDER — ESTRADIOL 1 MG PO TABS
0.5000 mg | ORAL_TABLET | Freq: Every day | ORAL | Status: DC
  Administered 2015-04-17: 0.5 mg via ORAL
  Filled 2015-04-16: qty 0.5

## 2015-04-16 MED ORDER — ALPRAZOLAM 0.5 MG PO TABS: 1.0000 mg | ORAL_TABLET | Freq: Three times a day (TID) | ORAL | Status: DC | PRN

## 2015-04-16 MED ORDER — MORPHINE SULFATE 2 MG/ML IJ SOLN: 1.0000 mg | INTRAMUSCULAR | Status: DC | PRN

## 2015-04-16 MED ORDER — MIDAZOLAM HCL 2 MG/2ML IJ SOLN
INTRAMUSCULAR | Status: AC
  Filled 2015-04-16: qty 2

## 2015-04-16 MED ORDER — LIDOCAINE HCL 4 % MT SOLN
OROMUCOSAL | Status: DC | PRN
  Administered 2015-04-16: 4 mL via TOPICAL

## 2015-04-16 MED ORDER — PROMETHAZINE HCL 25 MG/ML IJ SOLN
12.5000 mg | Freq: Four times a day (QID) | INTRAMUSCULAR | Status: DC | PRN
  Administered 2015-04-16: 12.5 mg via INTRAVENOUS
  Filled 2015-04-16: qty 1

## 2015-04-16 MED ORDER — SUCCINYLCHOLINE CHLORIDE 20 MG/ML IJ SOLN
INTRAMUSCULAR | Status: DC | PRN
  Administered 2015-04-16: 80 mg via INTRAVENOUS

## 2015-04-16 MED ORDER — MIDAZOLAM HCL 5 MG/5ML IJ SOLN
INTRAMUSCULAR | Status: DC | PRN
  Administered 2015-04-16: 2 mg via INTRAVENOUS

## 2015-04-16 MED ORDER — THROMBIN 20000 UNITS EX SOLR
CUTANEOUS | Status: DC | PRN
  Administered 2015-04-16: 20 mL via TOPICAL

## 2015-04-16 MED ORDER — DIAZEPAM 5 MG/ML IJ SOLN
INTRAMUSCULAR | Status: AC
  Filled 2015-04-16: qty 2

## 2015-04-16 MED ORDER — MENTHOL 3 MG MT LOZG: 1.0000 | LOZENGE | OROMUCOSAL | Status: DC | PRN

## 2015-04-16 MED ORDER — SODIUM CHLORIDE 0.9 % IJ SOLN: 3.0000 mL | INTRAMUSCULAR | Status: DC | PRN

## 2015-04-16 MED ORDER — DIAZEPAM 5 MG/ML IJ SOLN
2.5000 mg | Freq: Three times a day (TID) | INTRAMUSCULAR | Status: DC | PRN
  Administered 2015-04-16: 2.5 mg via INTRAVENOUS

## 2015-04-16 MED ORDER — BUPIVACAINE HCL (PF) 0.25 % IJ SOLN
INTRAMUSCULAR | Status: DC | PRN
  Administered 2015-04-16: 4 mL

## 2015-04-16 MED ORDER — OXYCODONE-ACETAMINOPHEN 5-325 MG PO TABS
1.0000 | ORAL_TABLET | ORAL | Status: DC | PRN
  Administered 2015-04-16 (×2): 2 via ORAL
  Administered 2015-04-16: 1 via ORAL
  Administered 2015-04-17: 2 via ORAL
  Filled 2015-04-16 (×3): qty 2

## 2015-04-16 MED ORDER — PROPOFOL 10 MG/ML IV BOLUS
INTRAVENOUS | Status: AC
  Filled 2015-04-16: qty 20

## 2015-04-16 MED ORDER — POTASSIUM CHLORIDE IN NACL 20-0.9 MEQ/L-% IV SOLN
INTRAVENOUS | Status: DC
  Filled 2015-04-16 (×3): qty 1000

## 2015-04-16 MED ORDER — LACTATED RINGERS IV SOLN
INTRAVENOUS | Status: DC | PRN
  Administered 2015-04-16: 07:00:00 via INTRAVENOUS

## 2015-04-16 MED ORDER — LIDOCAINE HCL (CARDIAC) 20 MG/ML IV SOLN
INTRAVENOUS | Status: DC | PRN
  Administered 2015-04-16: 80 mg via INTRAVENOUS

## 2015-04-16 MED ORDER — PROMETHAZINE HCL 25 MG/ML IJ SOLN: 6.2500 mg | INTRAMUSCULAR | Status: DC | PRN

## 2015-04-16 MED ORDER — KETOROLAC TROMETHAMINE 15 MG/ML IJ SOLN
INTRAMUSCULAR | Status: AC
  Administered 2015-04-16: 15 mg
  Filled 2015-04-16: qty 1

## 2015-04-16 MED ORDER — SODIUM CHLORIDE 0.9 % IR SOLN
Status: DC | PRN
  Administered 2015-04-16: 500 mL

## 2015-04-16 MED ORDER — HYDROMORPHONE HCL 1 MG/ML IJ SOLN
0.2500 mg | INTRAMUSCULAR | Status: DC | PRN
  Administered 2015-04-16 (×4): 0.5 mg via INTRAVENOUS

## 2015-04-16 MED ORDER — PHENOL 1.4 % MT LIQD: 1.0000 | OROMUCOSAL | Status: DC | PRN

## 2015-04-16 MED ORDER — SODIUM CHLORIDE 0.9 % IJ SOLN
3.0000 mL | Freq: Two times a day (BID) | INTRAMUSCULAR | Status: DC
  Administered 2015-04-16: 3 mL via INTRAVENOUS

## 2015-04-16 MED ORDER — PHENYLEPHRINE HCL 10 MG/ML IJ SOLN
INTRAMUSCULAR | Status: DC | PRN
Start: 2015-04-16 — End: 2015-04-16
  Administered 2015-04-16 (×4): 80 ug via INTRAVENOUS

## 2015-04-16 SURGICAL SUPPLY — 69 items
BAG DECANTER FOR FLEXI CONT (MISCELLANEOUS) ×3 IMPLANT
BENZOIN TINCTURE PRP APPL 2/3 (GAUZE/BANDAGES/DRESSINGS) ×3 IMPLANT
BIT DRILL PLIF MAS 5.0MM DISP (DRILL) ×1 IMPLANT
BLADE CLIPPER SURG (BLADE) IMPLANT
BONE MATRIX OSTEOCEL PRO SM (Bone Implant) ×3 IMPLANT
BUR MATCHSTICK NEURO 3.0 LAGG (BURR) ×3 IMPLANT
CAGE COROENT MP 8X23 (Cage) ×6 IMPLANT
CANISTER SUCT 3000ML PPV (MISCELLANEOUS) ×3 IMPLANT
CLIP NEUROVISION LG (CLIP) ×3 IMPLANT
CLOSURE WOUND 1/2 X4 (GAUZE/BANDAGES/DRESSINGS) ×2
CONT SPEC 4OZ CLIKSEAL STRL BL (MISCELLANEOUS) ×6 IMPLANT
COVER BACK TABLE 24X17X13 BIG (DRAPES) IMPLANT
COVER BACK TABLE 60X90IN (DRAPES) ×3 IMPLANT
DRAPE C-ARM 42X72 X-RAY (DRAPES) ×3 IMPLANT
DRAPE C-ARMOR (DRAPES) ×3 IMPLANT
DRAPE LAPAROTOMY 100X72X124 (DRAPES) ×3 IMPLANT
DRAPE POUCH INSTRU U-SHP 10X18 (DRAPES) ×3 IMPLANT
DRAPE SURG 17X23 STRL (DRAPES) ×3 IMPLANT
DRILL PLIF MAS 5.0MM DISP (DRILL) ×3
DRSG OPSITE 4X5.5 SM (GAUZE/BANDAGES/DRESSINGS) ×6 IMPLANT
DRSG OPSITE POSTOP 4X6 (GAUZE/BANDAGES/DRESSINGS) ×3 IMPLANT
DRSG TELFA 3X8 NADH (GAUZE/BANDAGES/DRESSINGS) ×3 IMPLANT
DURAPREP 26ML APPLICATOR (WOUND CARE) ×3 IMPLANT
ELECT REM PT RETURN 9FT ADLT (ELECTROSURGICAL) ×3
ELECTRODE REM PT RTRN 9FT ADLT (ELECTROSURGICAL) ×1 IMPLANT
EVACUATOR 1/8 PVC DRAIN (DRAIN) ×3 IMPLANT
GAUZE SPONGE 4X4 16PLY XRAY LF (GAUZE/BANDAGES/DRESSINGS) IMPLANT
GLOVE BIO SURGEON STRL SZ8 (GLOVE) ×6 IMPLANT
GLOVE BIOGEL PI IND STRL 7.0 (GLOVE) ×2 IMPLANT
GLOVE BIOGEL PI IND STRL 8 (GLOVE) ×1 IMPLANT
GLOVE BIOGEL PI INDICATOR 7.0 (GLOVE) ×4
GLOVE BIOGEL PI INDICATOR 8 (GLOVE) ×2
GLOVE ECLIPSE 7.5 STRL STRAW (GLOVE) ×3 IMPLANT
GLOVE SS N UNI LF 7.0 STRL (GLOVE) ×12 IMPLANT
GOWN STRL REUS W/ TWL LRG LVL3 (GOWN DISPOSABLE) ×2 IMPLANT
GOWN STRL REUS W/ TWL XL LVL3 (GOWN DISPOSABLE) ×2 IMPLANT
GOWN STRL REUS W/TWL 2XL LVL3 (GOWN DISPOSABLE) IMPLANT
GOWN STRL REUS W/TWL LRG LVL3 (GOWN DISPOSABLE) ×4
GOWN STRL REUS W/TWL XL LVL3 (GOWN DISPOSABLE) ×4
HEMOSTAT POWDER KIT SURGIFOAM (HEMOSTASIS) IMPLANT
KIT BASIN OR (CUSTOM PROCEDURE TRAY) ×3 IMPLANT
KIT NEEDLE NVM5 EMG ELECT (KITS) ×1 IMPLANT
KIT NEEDLE NVM5 EMG ELECTRODE (KITS) ×2
KIT ROOM TURNOVER OR (KITS) ×3 IMPLANT
MILL MEDIUM DISP (BLADE) ×3 IMPLANT
NEEDLE HYPO 25X1 1.5 SAFETY (NEEDLE) ×3 IMPLANT
NS IRRIG 1000ML POUR BTL (IV SOLUTION) ×3 IMPLANT
PACK LAMINECTOMY NEURO (CUSTOM PROCEDURE TRAY) ×3 IMPLANT
PAD ARMBOARD 7.5X6 YLW CONV (MISCELLANEOUS) ×9 IMPLANT
ROD PREBENT 45MM LUMBAR (Rod) ×6 IMPLANT
SCREW LOCK (Screw) ×8 IMPLANT
SCREW LOCK FXNS SPNE MAS PL (Screw) ×4 IMPLANT
SCREW PLIF MAS 5.0X35 (Screw) ×6 IMPLANT
SCREW SHANK PLIF 5.0X25 LUMBAR (Screw) ×6 IMPLANT
SCREW TULIP 5.5 (Screw) ×6 IMPLANT
SPONGE LAP 4X18 X RAY DECT (DISPOSABLE) IMPLANT
SPONGE SURGIFOAM ABS GEL 100 (HEMOSTASIS) ×3 IMPLANT
STRIP CLOSURE SKIN 1/2X4 (GAUZE/BANDAGES/DRESSINGS) ×4 IMPLANT
SUT VIC AB 0 CT1 18XCR BRD8 (SUTURE) ×1 IMPLANT
SUT VIC AB 0 CT1 8-18 (SUTURE) ×2
SUT VIC AB 2-0 CP2 18 (SUTURE) ×3 IMPLANT
SUT VIC AB 3-0 SH 8-18 (SUTURE) ×6 IMPLANT
SYR 20ML ECCENTRIC (SYRINGE) ×3 IMPLANT
SYR 3ML LL SCALE MARK (SYRINGE) IMPLANT
TAPE STRIPS DRAPE STRL (GAUZE/BANDAGES/DRESSINGS) ×3 IMPLANT
TOWEL OR 17X24 6PK STRL BLUE (TOWEL DISPOSABLE) ×3 IMPLANT
TOWEL OR 17X26 10 PK STRL BLUE (TOWEL DISPOSABLE) ×3 IMPLANT
TRAY FOLEY W/METER SILVER 14FR (SET/KITS/TRAYS/PACK) ×3 IMPLANT
WATER STERILE IRR 1000ML POUR (IV SOLUTION) ×3 IMPLANT

## 2015-04-16 NOTE — Transfer of Care (Signed)
Immediate Anesthesia Transfer of Care Note  Patient: Monique Thomas  Procedure(s) Performed: Procedure(s) with comments: masPLIF  - L2-L3  (N/A) - masPLIF  - L2-L3   Patient Location: PACU  Anesthesia Type:General  Level of Consciousness: awake, alert  and oriented  Airway & Oxygen Therapy: Patient Spontanous Breathing and Patient connected to nasal cannula oxygen  Post-op Assessment: Report given to RN and Post -op Vital signs reviewed and stable  Post vital signs: Reviewed and stable  Last Vitals:  Filed Vitals:   04/16/15 1000  BP:   Pulse: 75  Temp:   Resp:     Complications: No apparent anesthesia complications

## 2015-04-16 NOTE — Plan of Care (Signed)
Problem: Consults Goal: Diagnosis - Spinal Surgery Outcome: Completed/Met Date Met:  04/16/15 Thoraco/Lumbar Spine Fusion

## 2015-04-16 NOTE — Anesthesia Procedure Notes (Signed)
Procedure Name: Intubation Performed by: Mariea Clonts Oxygen Delivery Method: Circle system utilized Preoxygenation: Pre-oxygenation with 100% oxygen Intubation Type: IV induction Ventilation: Mask ventilation without difficulty Laryngoscope Size: Miller and 2 Grade View: Grade II Tube type: Oral Tube size: 7.0 mm Number of attempts: 1 Placement Confirmation: ETT inserted through vocal cords under direct vision,  breath sounds checked- equal and bilateral and positive ETCO2 Tube secured with: Tape Dental Injury: Teeth and Oropharynx as per pre-operative assessment

## 2015-04-16 NOTE — Anesthesia Preprocedure Evaluation (Addendum)
Anesthesia Evaluation  Patient identified by MRN, date of birth, ID band Patient awake    Reviewed: Allergy & Precautions, NPO status , Patient's Chart, lab work & pertinent test results  Airway Mallampati: II  TM Distance: >3 FB Neck ROM: Full    Dental no notable dental hx.    Pulmonary neg pulmonary ROS,  breath sounds clear to auscultation  Pulmonary exam normal       Cardiovascular negative cardio ROS Normal cardiovascular examRhythm:Regular Rate:Normal     Neuro/Psych PSYCHIATRIC DISORDERS Anxiety Depression negative neurological ROS     GI/Hepatic Neg liver ROS, GERD-  ,  Endo/Other  Hypothyroidism   Renal/GU Renal disease     Musculoskeletal  (+) Arthritis -,   Abdominal   Peds  Hematology negative hematology ROS (+) anemia ,   Anesthesia Other Findings   Reproductive/Obstetrics negative OB ROS                            Anesthesia Physical Anesthesia Plan  ASA: II  Anesthesia Plan: General   Post-op Pain Management:    Induction: Intravenous  Airway Management Planned: Oral ETT  Additional Equipment:   Intra-op Plan:   Post-operative Plan: Extubation in OR  Informed Consent: I have reviewed the patients History and Physical, chart, labs and discussed the procedure including the risks, benefits and alternatives for the proposed anesthesia with the patient or authorized representative who has indicated his/her understanding and acceptance.   Dental advisory given  Plan Discussed with: CRNA  Anesthesia Plan Comments:         Anesthesia Quick Evaluation

## 2015-04-16 NOTE — Op Note (Signed)
04/16/2015  9:55 AM  PATIENT:  Monique Thomas  64 y.o. female  PRE-OPERATIVE DIAGNOSIS:  Degenerative disc disease L2-3 with retrolisthesis, adjacent spinal stenosis, back and leg pain  POST-OPERATIVE DIAGNOSIS:  Same  PROCEDURE:   1. Decompressive lumbar laminectomy L2-3 requiring more work than would be required for a simple exposure of the disk for PLIF in order to adequately decompress the neural elements and address the spinal stenosis 2. Posterior lumbar interbody fusion l2-3 using PEEK interbody cages packed with morcellized allograft and autograft 3. Posterior fixation L2-3 using cortical pedicle screws.  4. Intertransverse arthrodesis L2-3 using morcellized autograft and allograft.  SURGEON:  Sherley Bounds, MD  ASSISTANTS: Dr Sherwood Gambler  ANESTHESIA:  General  EBL: Less than 50 ml  Total I/O In: -  Out: 100 [Urine:100]  BLOOD ADMINISTERED:none  DRAINS: None   INDICATION FOR PROCEDURE: This patient presented with severe chronic back pain with radiation into the left leg. He undergone a previous fusion in the remote past at L3-4. MRI and CT scan and plain film showed adjacent level breakdown with retrolisthesis and spinal stenosis. She tried medical management including upper sterile injections without relief for over a year. Recommended decompression and instrumented fusion. Patient understood the risks, benefits, and alternatives and potential outcomes and wished to proceed.  PROCEDURE DETAILS:  The patient was brought to the operating room. After induction of generalized endotracheal anesthesia the patient was rolled into the prone position on chest rolls and all pressure points were padded. The patient's lumbar region was cleaned and then prepped with DuraPrep and draped in the usual sterile fashion. Anesthesia was injected and then a dorsal midline incision was made and carried down to the lumbosacral fascia. The fascia was opened and the paraspinous musculature was taken  down in a subperiosteal fashion to expose L2-3. A self-retaining retractor was placed. Intraoperative fluoroscopy confirmed my level, and I started with placement of the L2 cortical pedicle screws. The pedicle screw entry zones were identified utilizing surface landmarks and  AP and lateral fluoroscopy. I scored the cortex with the high-speed drill and then used the hand drill and EMG monitoring to drill an upward and outward direction into the pedicle. I then tapped line to line, and the tap was also monitored. I then placed a 5-0 x 25 mm cortical pedicle screw into the pedicles of L2 bilaterally. I then turned my attention to the decompression and the spinous process was removed and complete lumbar laminectomies, hemi- facetectomies, and foraminotomies were performed at L2-3. The patient had significant spinal stenosis and this required more work than would be required for a simple exposure of the disc for posterior lumbar interbody fusion. Much more generous decompression was undertaken in order to adequately decompress the neural elements and address the patient's leg pain. The yellow ligament was removed to expose the underlying dura and nerve roots, and generous foraminotomies were performed to adequately decompress the neural elements. Both the exiting and traversing nerve roots were decompressed on both sides until a coronary dilator passed easily along the nerve roots. Once the decompression was complete, I turned my attention to the posterior lower lumbar interbody fusion. The epidural venous vasculature was coagulated and cut sharply. Disc space was incised and the initial discectomy was performed with pituitary rongeurs. The disc space was distracted with sequential distractors to a height of 8 mm. We then used a series of scrapers and shavers to prepare the endplates for fusion. The midline was prepared with Epstein curettes. Once the  complete discectomy was finished, we packed an appropriate sized peek  interbody cage with local autograft and morcellized allograft, gently retracted the nerve root, and tapped the cage into position at L2-3.  The midline between the cages was packed with morselized autograft and allograft. We then turned our attention to the placement of the lower pedicle screws. The pedicle screw entry zones were identified utilizing surface landmarks and fluoroscopy. I drilled into each pedicle utilizing the hand drill and EMG monitoring, and tapped each pedicle with the appropriate tap. We palpated with a ball probe to assure no break in the cortex. We then placed 5-0 x 35 mm screws into the pedicles bilaterally at L3. We then decorticated the transverse processes and laid a mixture of morcellized autograft and allograft out over these to perform intertransverse arthrodesis at L2-3. We then placed lordotic rods into the multiaxial screw heads of the pedicle screws and locked these in position with the locking caps and anti-torque device. We then checked our construct with AP and lateral fluoroscopy. Irrigated with copious amounts of bacitracin-containing saline solution. Placed a medium Hemovac drain through separate stab incision. Inspected the nerve roots once again to assure adequate decompression, lined to the dura with Gelfoam, and closed the muscle and the fascia with 0 Vicryl. Closed the subcutaneous tissues with 2-0 Vicryl and subcuticular tissues with 3-0 Vicryl. The skin was closed with benzoin and Steri-Strips. Dressing was then applied, the patient was awakened from general anesthesia and transported to the recovery room in stable condition. At the end of the procedure all sponge, needle and instrument counts were correct.   PLAN OF CARE: Admit to inpatient   PATIENT DISPOSITION:  PACU - hemodynamically stable.   Delay start of Pharmacological VTE agent (>24hrs) due to surgical blood loss or risk of bleeding:  yes

## 2015-04-16 NOTE — Progress Notes (Signed)
Foley d/c prior to transfer [place]

## 2015-04-16 NOTE — H&P (Signed)
Subjective: Patient is a 64 y.o. female admitted for back pain. Onset of symptoms was several months ago, gradually worsening since that time.  The pain is rated severe, and is located at the across the lower back and radiates to legs. The pain is described as aching and occurs all day. The symptoms have been progressive. Symptoms are exacerbated by exercise. MRI or CT showed DDD   Past Medical History  Diagnosis Date  . Hyperlipidemia   . Thyroid disease   . GERD (gastroesophageal reflux disease)   . Anemia   . Anxiety   . Back pain, chronic   . Unspecified vitamin D deficiency   . Kidney stone     2015  . Umbilical hernia   . Hypothyroidism   . Depression   . Arthritis     Past Surgical History  Procedure Laterality Date  . Abdominal hysterectomy    . Cesarean section    . Hernia repair    . Appendectomy    . Tonsillectomy and adenoidectomy    . Spine surgery      lumbar  . Laparoscopic lysis intestinal adhesions      Prior to Admission medications   Medication Sig Start Date End Date Taking? Authorizing Provider  ALPRAZolam Duanne Moron) 1 MG tablet TAKE 1/2-1 TABLET BY MOUTH THREE TIMES DAILY AS NEEDED FOR ANXIETY OR SLEEP 03/27/15  Yes Vicie Mutters, PA-C  cholecalciferol (VITAMIN D) 1000 UNITS tablet Take 1,000 Units by mouth 2 (two) times daily.    Yes Historical Provider, MD  Cyanocobalamin (B-12 PO) Take 1 tablet by mouth daily.   Yes Historical Provider, MD  estradiol (ESTRACE) 0.5 MG tablet Take 0.5 mg by mouth daily.   Yes Historical Provider, MD  levothyroxine (SYNTHROID, LEVOTHROID) 88 MCG tablet Take 88 mcg by mouth daily before breakfast. Brand name only 1 QD   Yes Historical Provider, MD  lidocaine (LIDODERM) 5 % APP 1 PATCH AA OF SKIN FOR 12 H THEN TK THE PATCH OFF FOR 12 H 02/13/15  Yes Historical Provider, MD  Magnesium 500 MG CAPS Take 2 capsules by mouth daily.   Yes Historical Provider, MD  traMADol (ULTRAM) 50 MG tablet TK 1 T PO Q 6 H PRN 03/19/15  Yes  Historical Provider, MD   Allergies  Allergen Reactions  . Celebrex [Celecoxib]     "paniac attack"  . Other     Skin bruises very easily and peels back  . Topamax [Topiramate]     MEMORY  . Entex Lq [Phenylephrine-Guaifenesin] Rash  . Fiorinal [Butalbital-Aspirin-Caffeine] Rash    History  Substance Use Topics  . Smoking status: Never Smoker   . Smokeless tobacco: Not on file  . Alcohol Use: No    Family History  Problem Relation Age of Onset  . Heart disease Mother   . Hyperlipidemia Mother   . Thyroid disease Mother   . Asthma Sister   . Cancer Maternal Grandmother     ovarian     Review of Systems  Positive ROS: neg  All other systems have been reviewed and were otherwise negative with the exception of those mentioned in the HPI and as above.  Objective: Vital signs in last 24 hours: Temp:  [97.9 F (36.6 C)] 97.9 F (36.6 C) (07/28 0609) Pulse Rate:  [71] 71 (07/28 0609) Resp:  [18] 18 (07/28 0609) BP: (107)/(49) 107/49 mmHg (07/28 0609) SpO2:  [97 %] 97 % (07/28 0609) Weight:  [120 lb 1 oz (54.46 kg)] 120 lb  1 oz (54.46 kg) (07/28 0609)  General Appearance: Alert, cooperative, no distress, appears stated age Head: Normocephalic, without obvious abnormality, atraumatic Eyes: PERRL, conjunctiva/corneas clear, EOM's intact    Neck: Supple, symmetrical, trachea midline Back: Symmetric, no curvature, ROM normal, no CVA tenderness Lungs:  respirations unlabored Heart: Regular rate and rhythm Abdomen: Soft, non-tender Extremities: Extremities normal, atraumatic, no cyanosis or edema Pulses: 2+ and symmetric all extremities Skin: Skin color, texture, turgor normal, no rashes or lesions  NEUROLOGIC:   Mental status: Alert and oriented x4,  no aphasia, good attention span, fund of knowledge, and memory Motor Exam - grossly normal Sensory Exam - grossly normal Reflexes: 1+ Coordination - grossly normal Gait - grossly normal Balance - grossly  normal Cranial Nerves: I: smell Not tested  II: visual acuity  OS: nl    OD: nl  II: visual fields Full to confrontation  II: pupils Equal, round, reactive to light  III,VII: ptosis None  III,IV,VI: extraocular muscles  Full ROM  V: mastication Normal  V: facial light touch sensation  Normal  V,VII: corneal reflex  Present  VII: facial muscle function - upper  Normal  VII: facial muscle function - lower Normal  VIII: hearing Not tested  IX: soft palate elevation  Normal  IX,X: gag reflex Present  XI: trapezius strength  5/5  XI: sternocleidomastoid strength 5/5  XI: neck flexion strength  5/5  XII: tongue strength  Normal    Data Review Lab Results  Component Value Date   WBC 5.4 04/07/2015   HGB 13.1 04/07/2015   HCT 38.2 04/07/2015   MCV 89.9 04/07/2015   PLT 320 04/07/2015   Lab Results  Component Value Date   NA 137 04/07/2015   K 4.0 04/07/2015   CL 102 04/07/2015   CO2 27 04/07/2015   BUN 10 04/07/2015   CREATININE 0.66 04/07/2015   GLUCOSE 98 04/07/2015   Lab Results  Component Value Date   INR 1.07 04/07/2015    Assessment/Plan: Patient admitted for PLIF L2-3. Patient has failed a reasonable attempt at conservative therapy.  I explained the condition and procedure to the patient and answered any questions.  Patient wishes to proceed with procedure as planned. Understands risks/ benefits and typical outcomes of procedure.   Chanze Teagle S 04/16/2015 7:14 AM

## 2015-04-17 ENCOUNTER — Encounter (HOSPITAL_COMMUNITY): Payer: Self-pay | Admitting: Neurological Surgery

## 2015-04-17 MED ORDER — MAGNESIUM HYDROXIDE 400 MG/5ML PO SUSP
30.0000 mL | Freq: Every day | ORAL | Status: DC | PRN
  Administered 2015-04-17: 30 mL via ORAL
  Filled 2015-04-17: qty 30

## 2015-04-17 MED ORDER — OXYCODONE-ACETAMINOPHEN 5-325 MG PO TABS: 1.0000 | ORAL_TABLET | ORAL | Status: DC | PRN

## 2015-04-17 MED ORDER — METHOCARBAMOL 500 MG PO TABS: 500.0000 mg | ORAL_TABLET | Freq: Four times a day (QID) | ORAL | Status: DC | PRN

## 2015-04-17 NOTE — Evaluation (Signed)
Occupational Therapy Evaluation Patient Details Name: Roshanna Cimino Player MRN: [id number] DOB: Oct 13, 1950 Today's Date: 04/17/2015    History of Present Illness s/p PLIF L2-3. Pt had her first fusion in 2010.   Clinical Impression   Pt was independent in ADL and IADL prior to admission.  Educated pt in back precautions related to ADL transfers, self care and IADL.  Pt verbalized understanding of all.  Will have assist of her daughter through the weekend 24 hours and then intermittently.  No further OT needs.    Follow Up Recommendations  No OT follow up    Equipment Recommendations  None recommended by OT    Recommendations for Other Services       Precautions / Restrictions Precautions Precautions: Back Precaution Booklet Issued: Yes (comment) Precaution Comments: reviewed back precautions Required Braces or Orthoses: Spinal Brace Spinal Brace: Lumbar corset Restrictions Weight Bearing Restrictions: No      Mobility Bed Mobility               General bed mobility comments: pt in chair, but reports routinely performing log roll technique since her first back sx.  Transfers Overall transfer level: Modified independent                    Balance                                            ADL Overall ADL's : Modified independent                                       General ADL Comments: Pt able to cross foot over opposite knee to donn and doff socks.  Recommended a long bath sponge. Instructed in multiple uses of reacher.  Daughter will assist with groceries, putting out trash, laundry and heavy housekeeping.     Vision     Perception     Praxis      Pertinent Vitals/Pain Pain Assessment: 0-10 Pain Score: 5  Pain Location: back Pain Descriptors / Indicators: Sore Pain Intervention(s): Premedicated before session;Repositioned;Monitored during session     Hand Dominance Right   Extremity/Trunk Assessment  Upper Extremity Assessment Upper Extremity Assessment: Overall WFL for tasks assessed   Lower Extremity Assessment Lower Extremity Assessment: Defer to PT evaluation       Communication Communication Communication: No difficulties   Cognition Arousal/Alertness: Awake/alert Behavior During Therapy: WFL for tasks assessed/performed (periodically slightly labile) Overall Cognitive Status: Within Functional Limits for tasks assessed                     General Comments       Exercises       Shoulder Instructions      Home Living Family/patient expects to be discharged to:: Private residence Living Arrangements: Alone Available Help at Discharge: Family;Available 24 hours/day Type of Home: House Home Access: Stairs to enter [employer] of Steps: 7   Home Layout: One level     Bathroom Shower/Tub: Tub/shower unit;Curtain Shower/tub characteristics: Architectural technologist: Standard     Home Equipment: Financial controller: Reacher        Prior Functioning/Environment Level of Independence: Independent             OT Diagnosis:  OT Problem List:     OT Treatment/Interventions:      OT Goals(Current goals can be found in the care plan section)    OT Frequency:     Barriers to D/C:            Co-evaluation              End of Session    Activity Tolerance: Patient tolerated treatment well Patient left: in chair;with call bell/phone within reach;with family/visitor present   Time: 0820-0840 OT Time Calculation (min): 20 min Charges:  OT General Charges $OT Visit: 1 Procedure OT Evaluation $Initial OT Evaluation Tier I: 1 Procedure G-Codes:    Malka So 04/17/2015, 8:49 AM  [phone]

## 2015-04-17 NOTE — Anesthesia Postprocedure Evaluation (Signed)
Anesthesia Post Note  Patient: Monique Thomas  Procedure(s) Performed: Procedure(s) (LRB): masPLIF  - L2-L3  (N/A)  Anesthesia type: General  Patient location: PACU  Post pain: Pain level controlled  Post assessment: Post-op Vital signs reviewed  Last Vitals: BP 96/49 mmHg  Pulse 61  Temp(Src) 36.6 C (Oral)  Resp 18  Ht 5' 1.5" (1.562 m)  Wt 120 lb 1 oz (54.46 kg)  BMI 22.32 kg/m2  SpO2 97%  Post vital signs: Reviewed  Level of consciousness: sedated  Complications: No apparent anesthesia complications

## 2015-04-17 NOTE — Evaluation (Signed)
Physical Therapy Evaluation Patient Details Name: Monique Thomas MRN: [id number] DOB: May 12, 1951 Today's Date: 04/17/2015   History of Present Illness  s/p PLIF L2-3. Pt had her first fusion in 2010.  Clinical Impression  Pt. Presents following above surgery at an independent level with her transfers and gait, and a supervision level to negotiate steps.  Her daughter will be with her throughout the weekend.  I have provided her with appropriate back education, limitations and precautions. She does not have any further acute PT needs.  I will sign off.     Follow Up Recommendations No PT follow up    Equipment Recommendations  None recommended by PT    Recommendations for Other Services       Precautions / Restrictions Precautions Precautions: Back Precaution Booklet Issued: Yes (comment) Precaution Comments: reinforced back precautions Required Braces or Orthoses: Spinal Brace Spinal Brace: Lumbar corset;Applied in sitting position Restrictions Weight Bearing Restrictions: No Other Position/Activity Restrictions: Educated pt. on liftimg limitations as per her surgeon and to avoid driving until cleared by surgeon      Mobility  Bed Mobility Overal bed mobility:  (not tested, pt. up in recliner; reinforced log rolling )             General bed mobility comments: pt in chair, but reports routinely performing log roll technique since her first back sx.  Transfers Overall transfer level: Modified independent Equipment used: None             General transfer comment: good technique, no LOB noted  Ambulation/Gait Ambulation/Gait assistance: Independent Ambulation Distance (Feet): 150 Feet Assistive device: None Gait Pattern/deviations: Step-through pattern Gait velocity: near or at normal   General Gait Details: Pt. with no overt LOB, no instabilities; good and safe technique observed  Stairs Stairs: Yes Stairs assistance: Supervision Stair Management: Two  rails;Step to pattern;Forwards Number of Stairs: 7 General stair comments: Pt. able to negotiate steps with supervision for safety only; no LOB, not physical assist needed; pt. reports using step to pattern even prior to admission  Wheelchair Mobility    Modified Rankin (Stroke Patients Only)       Balance                                             Pertinent Vitals/Pain Pain Assessment: 0-10 Pain Score: 4  Pain Location: lumbar spine Pain Descriptors / Indicators: Sore Pain Intervention(s): Limited activity within patient's tolerance;Monitored during session;Premedicated before session;Repositioned    Home Living Family/patient expects to be discharged to:: Private residence Living Arrangements: Alone Available Help at Discharge: Family;Available 24 hours/day Type of Home: House Home Access: Stairs to enter Entrance Stairs-Rails: Right;Left;Can reach both Entrance Stairs-Number of Steps: 7 Home Layout: One level Home Equipment: Adaptive equipment      Prior Function Level of Independence: Independent               Hand Dominance   Dominant Hand: Right    Extremity/Trunk Assessment   Upper Extremity Assessment: Defer to OT evaluation           Lower Extremity Assessment: Overall WFL for tasks assessed      Cervical / Trunk Assessment: Normal  Communication   Communication: No difficulties  Cognition Arousal/Alertness: Awake/alert Behavior During Therapy: WFL for tasks assessed/performed Overall Cognitive Status: Within Functional Limits for tasks assessed  General Comments      Exercises        Assessment/Plan    PT Assessment Patent does not need any further PT services  PT Diagnosis Acute pain   PT Problem List    PT Treatment Interventions     PT Goals (Current goals can be found in the Care Plan section)      Frequency     Barriers to discharge        Co-evaluation                End of Session Equipment Utilized During Treatment: Back brace Activity Tolerance: Patient tolerated treatment well Patient left: in chair;with call bell/phone within reach;with family/visitor present (daughter Monique Thomas present) Nurse Communication: Mobility status         Time: 1020-1040 PT Time Calculation (min) (ACUTE ONLY): 20 min   Charges:   PT Evaluation $Initial PT Evaluation Tier I: 1 Procedure     PT G CodesLadona Ridgel 04/17/2015, 11:00 AM Gerlean Ren PT Acute Re[facility] Services [phone] Beeper [phone]

## 2015-04-17 NOTE — Progress Notes (Signed)
Patient alert and oriented, mae's well, voiding adequate amount of urine, swallowing without difficulty, c/o mild pain and medication given prior to discharged.. Patient discharged home with family. Script and discharged instructions given to patient. Patient and family stated understanding of d/c instructions given and has an appointment with MD. 

## 2015-04-17 NOTE — Discharge Summary (Signed)
Physician Discharge Summary  Patient ID: Monique Thomas Player MRN: [id number] DOB/AGE: 10/15/1950 64 y.o.  Admit date: 04/16/2015 Discharge date: 04/17/2015  Admission Diagnoses: DDD/ stenosis   Discharge Diagnoses: same   Discharged Condition: good  Hospital Course: The patient was admitted on 04/16/2015 and taken to the operating room where the patient underwent PLIF l2-3. The patient tolerated the procedure well and was taken to the recovery room and then to the floor in stable condition. The hospital course was routine. There were no complications. The wound remained clean dry and intact. Pt had appropriate back soreness. No complaints of leg pain or new N/T/W. The patient remained afebrile with stable vital signs, and tolerated a regular diet. The patient continued to increase activities, and pain was well controlled with oral pain medications.   Consults: None  Significant Diagnostic Studies:  Results for orders placed or performed during the hospital encounter of 04/07/15  Surgical pcr screen  Result Value Ref Range   MRSA, PCR NEGATIVE NEGATIVE   Staphylococcus aureus NEGATIVE NEGATIVE  Basic metabolic panel  Result Value Ref Range   Sodium 137 135 - 145 mmol/L   Potassium 4.0 3.5 - 5.1 mmol/L   Chloride 102 101 - 111 mmol/L   CO2 27 22 - 32 mmol/L   Glucose, Bld 98 65 - 99 mg/dL   BUN 10 6 - 20 mg/dL   Creatinine, Ser 0.66 0.44 - 1.00 mg/dL   Calcium 9.3 8.9 - 10.3 mg/dL   GFR calc non Af Amer >60 >60 mL/min   GFR calc Af Amer >60 >60 mL/min   Anion gap 8 5 - 15  CBC WITH DIFFERENTIAL  Result Value Ref Range   WBC 5.4 4.0 - 10.5 K/uL   RBC 4.25 3.87 - 5.11 MIL/uL   Hemoglobin 13.1 12.0 - 15.0 g/dL   HCT 38.2 36.0 - 46.0 %   MCV 89.9 78.0 - 100.0 fL   MCH 30.8 26.0 - 34.0 pg   MCHC 34.3 30.0 - 36.0 g/dL   RDW 12.9 11.5 - 15.5 %   Platelets 320 150 - 400 K/uL   Neutrophils Relative % 48 43 - 77 %   Neutro Abs 2.6 1.7 - 7.7 K/uL   Lymphocytes Relative 43 12 - 46 %   Lymphs Abs 2.3 0.7 - 4.0 K/uL   Monocytes Relative 6 3 - 12 %   Monocytes Absolute 0.3 0.1 - 1.0 K/uL   Eosinophils Relative 2 0 - 5 %   Eosinophils Absolute 0.1 0.0 - 0.7 K/uL   Basophils Relative 1 0 - 1 %   Basophils Absolute 0.1 0.0 - 0.1 K/uL  Protime-INR  Result Value Ref Range   Prothrombin Time 14.1 11.6 - 15.2 seconds   INR 1.07 0.00 - 1.49  Type and screen  Result Value Ref Range   ABO/RH(D) B POS    Antibody Screen NEG    Sample Expiration 04/21/2015     Chest 2 View  04/07/2015   CLINICAL DATA:  Preop for lumbar spine surgery.  EXAM: CHEST  2 VIEW  COMPARISON:  03/10/2008  FINDINGS: The heart size and mediastinal contours are within normal limits. Both lungs are clear. The visualized skeletal structures are unremarkable.  IMPRESSION: No active cardiopulmonary disease.   Electronically Signed   By: Rolm Baptise M.D.   On: 04/07/2015 11:52   Dg Lumbar Spine 2-3 Views  04/16/2015   CLINICAL DATA:  L2-3 posterior laminectomy  EXAM: LUMBAR SPINE - 2-3 VIEW; DG C-ARM  61-120 MIN  COMPARISON:  12/16/2014  FINDINGS: L2-3 transpedicular screws and connecting rods with interbody disc spacer. Prior L3-4 surgery noted.  FLUOROSCOPY TIME:  50 seconds  IMPRESSION: Postoperative findings   Electronically Signed   By: Skipper Cliche M.D.   On: 04/16/2015 09:55   Ct Lumbar Spine Wo Contrast  04/14/2015   CLINICAL DATA:  Left leg pain for 3 weeks.  Lumbar fusion in 2010.  EXAM: CT LUMBAR SPINE WITHOUT CONTRAST  TECHNIQUE: Multidetector CT imaging of the lumbar spine was performed without intravenous contrast administration. Multiplanar CT image reconstructions were also generated.  COMPARISON:  03/04/2013, 12/12/2012, MR lumbar spine 02/20/2015  FINDINGS: The vertebral body heights are maintained. There is 5 mm of retrolisthesis of L2 on L3. The paravertebral soft tissues are normal. There is no fracture. There is no spondylolysis. The visualized portion of the SI joints are unremarkable.  There  is severe degenerative disc disease at L2-3 with disc height loss. Mild degenerative disc disease at T12-L1.  There is anterior lumbar interbody fusion at L3-4 without failure or complication.  Nonobstructing punctate right nephrolithiasis.  T12-L1: Mild broad-based disc bulge. No foraminal or central canal stenosis.  L1-L2: Mild broad-based disc bulge without foraminal or central canal stenosis.  L2-L3: Broad-based disc bulge. Mild bilateral facet arthropathy. No foraminal stenosis. Mild central canal narrowing.  L3-L4: Interbody fusion. Mild bilateral facet arthropathy. No foraminal or central canal stenosis.  L4-L5: Mild broad-based disc bulge eccentric towards the right. Mild right foraminal stenosis. Mild bilateral facet arthropathy.  L5-S1: Mild broad-based disc bulge. No foraminal or central canal stenosis. Mild bilateral facet arthropathy. Left L5 pars interarticularis defect.  IMPRESSION: 1. Grade 1 anterolisthesis of L2 on L3 unchanged compared with the prior exam. 2. Degenerative disc disease at L2-3 with severe disc height loss. 3. Stable, anterior lumbar fusion of L3-4 without failure or complication.   Electronically Signed   By: Kathreen Devoid   On: 04/14/2015 14:37   Dg C-arm 1-60 Min  04/16/2015   CLINICAL DATA:  L2-3 posterior laminectomy  EXAM: LUMBAR SPINE - 2-3 VIEW; DG C-ARM 61-120 MIN  COMPARISON:  12/16/2014  FINDINGS: L2-3 transpedicular screws and connecting rods with interbody disc spacer. Prior L3-4 surgery noted.  FLUOROSCOPY TIME:  50 seconds  IMPRESSION: Postoperative findings   Electronically Signed   By: Skipper Cliche M.D.   On: 04/16/2015 09:55    Antibiotics:  Anti-infectives    Start     Dose/Rate Route Frequency Ordered Stop   04/16/15 1600  ceFAZolin (ANCEF) IVPB 1 g/50 mL premix     1 g 100 mL/hr over 30 Minutes Intravenous Every 8 hours 04/16/15 1247 04/17/15 0034   04/16/15 0816  bacitracin 50,000 Units in sodium chloride irrigation 0.9 % 500 mL irrigation   Status:  Discontinued       As needed 04/16/15 0817 04/16/15 0956   04/16/15 0700  ceFAZolin (ANCEF) IVPB 2 g/50 mL premix     2 g 100 mL/hr over 30 Minutes Intravenous To [facility] 04/15/15 0949 04/16/15 0751      Discharge Exam: Blood pressure 141/69, pulse 90, temperature 98.3 F (36.8 C), temperature source Oral, resp. rate 20, height 5' 1.5" (1.562 m), weight 120 lb 1 oz (54.46 kg), SpO2 100 %. Neurologic: Grossly normal incision CDI  Discharge Medications:     Medication List    STOP taking these medications        traMADol 50 MG tablet  Commonly known as:  Veatrice Bourbon  TAKE these medications        ALPRAZolam 1 MG tablet  Commonly known as:  XANAX  TAKE 1/2-1 TABLET BY MOUTH THREE TIMES DAILY AS NEEDED FOR ANXIETY OR SLEEP     B-12 PO  Take 1 tablet by mouth daily.     cholecalciferol 1000 UNITS tablet  Commonly known as:  VITAMIN D  Take 1,000 Units by mouth 2 (two) times daily.     estradiol 0.5 MG tablet  Commonly known as:  ESTRACE  Take 0.5 mg by mouth daily.     levothyroxine 88 MCG tablet  Commonly known as:  SYNTHROID, LEVOTHROID  Take 88 mcg by mouth daily before breakfast. Brand name only 1 QD     lidocaine 5 %  Commonly known as:  LIDODERM  APP 1 PATCH AA OF SKIN FOR 12 H THEN TK THE PATCH OFF FOR 12 H     Magnesium 500 MG Caps  Take 2 capsules by mouth daily.     methocarbamol 500 MG tablet  Commonly known as:  ROBAXIN  Take 1 tablet (500 mg total) by mouth every 6 (six) hours as needed for muscle spasms.     oxyCODONE-acetaminophen 5-325 MG per tablet  Commonly known as:  PERCOCET/ROXICET  Take 1-2 tablets by mouth every 4 (four) hours as needed for moderate pain.        Disposition: home   Final Dx: PLIF L2-3      Discharge Instructions     Remove dressing in 72 hours    Complete by:  As directed      Call MD for:  difficulty breathing, headache or visual disturbances    Complete by:  As directed      Call MD for:   persistant nausea and vomiting    Complete by:  As directed      Call MD for:  redness, tenderness, or signs of infection (pain, swelling, redness, odor or green/yellow discharge around incision site)    Complete by:  As directed      Call MD for:  severe uncontrolled pain    Complete by:  As directed      Call MD for:  temperature >100.4    Complete by:  As directed      Diet - low sodium heart healthy    Complete by:  As directed      Discharge instructions    Complete by:  As directed   May shower, no bending or twisting, no driving     Increase activity slowly    Complete by:  As directed            Follow-up Information    Follow up with Eustace Moore, MD In 2 weeks.   Specialty:  Neurosurgery   Contact information:   [address] [place] [address] [place] [address] [phone]        Signed: Eustace Moore 04/17/2015, 11:53 AM

## 2015-04-17 NOTE — Progress Notes (Signed)
Utilization review completed.  

## 2015-05-06 ENCOUNTER — Ambulatory Visit (INDEPENDENT_AMBULATORY_CARE_PROVIDER_SITE_OTHER): Payer: BC Managed Care – PPO | Admitting: Physician Assistant

## 2015-05-06 ENCOUNTER — Encounter: Payer: Self-pay | Admitting: Physician Assistant

## 2015-05-06 VITALS — BP 116/68 | HR 76 | Temp 97.9°F | Resp 16 | Ht 60.5 in | Wt 121.2 lb

## 2015-05-06 DIAGNOSIS — R350 Frequency of micturition: Secondary | ICD-10-CM | POA: Diagnosis not present

## 2015-05-06 DIAGNOSIS — G8929 Other chronic pain: Secondary | ICD-10-CM | POA: Diagnosis not present

## 2015-05-06 DIAGNOSIS — E039 Hypothyroidism, unspecified: Secondary | ICD-10-CM

## 2015-05-06 DIAGNOSIS — M549 Dorsalgia, unspecified: Secondary | ICD-10-CM

## 2015-05-06 DIAGNOSIS — Z79899 Other long term (current) drug therapy: Secondary | ICD-10-CM

## 2015-05-06 DIAGNOSIS — R5383 Other fatigue: Secondary | ICD-10-CM | POA: Diagnosis not present

## 2015-05-06 LAB — CBC WITH DIFFERENTIAL/PLATELET
Basophils Absolute: 0 10*3/uL (ref 0.0–0.1)
Basophils Relative: 1 % (ref 0–1)
EOS ABS: 0.1 10*3/uL (ref 0.0–0.7)
Eosinophils Relative: 2 % (ref 0–5)
HCT: 35.5 % — ABNORMAL LOW (ref 36.0–46.0)
Hemoglobin: 11.7 g/dL — ABNORMAL LOW (ref 12.0–15.0)
LYMPHS ABS: 1.7 10*3/uL (ref 0.7–4.0)
Lymphocytes Relative: 39 % (ref 12–46)
MCH: 29.7 pg (ref 26.0–34.0)
MCHC: 33 g/dL (ref 30.0–36.0)
MCV: 90.1 fL (ref 78.0–100.0)
MONO ABS: 0.3 10*3/uL (ref 0.1–1.0)
MPV: 8.2 fL — ABNORMAL LOW (ref 8.6–12.4)
Monocytes Relative: 6 % (ref 3–12)
NEUTROS PCT: 52 % (ref 43–77)
Neutro Abs: 2.2 10*3/uL (ref 1.7–7.7)
Platelets: 363 10*3/uL (ref 150–400)
RBC: 3.94 MIL/uL (ref 3.87–5.11)
RDW: 12.7 % (ref 11.5–15.5)
WBC: 4.3 10*3/uL (ref 4.0–10.5)

## 2015-05-06 NOTE — Progress Notes (Signed)
Assessment and Plan: Fatigue s/p surgery - exam unremarkable, no fever/chills, vitals normal Will check labs, rule out anemia, infection, dehydration Versus depression/limitation of the surgery   HPI 64 y.o.female with history of HTN, preDM, hypothyroidism, and recurrent UTIs  presents for follow up from surgery. She had surgery on 04/16/2015 for PLIF 12-3 with Dr. Ronnald Ramp. She states immediatly after the surgery she has been fatigued with weakness. She is sleeping well with xanax and oxycodone and muscle relaxer at night, does not feel drugged/foggy in the morning. She is eating and drinking plenty of fluids. She has had some constipation after the surgery, last BM today, and no blood/dark black stool. Has darker urine but no symptoms of UTI. Denies fever, chills, CP, SOB, CP. States her back is sore but she is doing okay. She is on thyroid like she is suppose to.    Images while in the hospital: Ct Lumbar Spine Wo Contrast  04/14/2015   CLINICAL DATA:  Left leg pain for 3 weeks.  Lumbar fusion in 2010.  EXAM: CT LUMBAR SPINE WITHOUT CONTRAST  TECHNIQUE: Multidetector CT imaging of the lumbar spine was performed without intravenous contrast administration. Multiplanar CT image reconstructions were also generated.  COMPARISON:  03/04/2013, 12/12/2012, MR lumbar spine 02/20/2015  FINDINGS: The vertebral body heights are maintained. There is 5 mm of retrolisthesis of L2 on L3. The paravertebral soft tissues are normal. There is no fracture. There is no spondylolysis. The visualized portion of the SI joints are unremarkable.  There is severe degenerative disc disease at L2-3 with disc height loss. Mild degenerative disc disease at T12-L1.  There is anterior lumbar interbody fusion at L3-4 without failure or complication.  Nonobstructing punctate right nephrolithiasis.  T12-L1: Mild broad-based disc bulge. No foraminal or central canal stenosis.  L1-L2: Mild broad-based disc bulge without foraminal or central  canal stenosis.  L2-L3: Broad-based disc bulge. Mild bilateral facet arthropathy. No foraminal stenosis. Mild central canal narrowing.  L3-L4: Interbody fusion. Mild bilateral facet arthropathy. No foraminal or central canal stenosis.  L4-L5: Mild broad-based disc bulge eccentric towards the right. Mild right foraminal stenosis. Mild bilateral facet arthropathy.  L5-S1: Mild broad-based disc bulge. No foraminal or central canal stenosis. Mild bilateral facet arthropathy. Left L5 pars interarticularis defect.  IMPRESSION: 1. Grade 1 anterolisthesis of L2 on L3 unchanged compared with the prior exam. 2. Degenerative disc disease at L2-3 with severe disc height loss. 3. Stable, anterior lumbar fusion of L3-4 without failure or complication.   Electronically Signed   By: Kathreen Devoid   On: 04/14/2015 14:37    Past Medical History  Diagnosis Date  . Hyperlipidemia   . Thyroid disease   . GERD (gastroesophageal reflux disease)   . Anemia   . Anxiety   . Back pain, chronic   . Unspecified vitamin D deficiency   . Kidney stone     2015  . Umbilical hernia   . Hypothyroidism   . Depression   . Arthritis      Allergies  Allergen Reactions  . Celebrex [Celecoxib]     "paniac attack"  . Other     Skin bruises very easily and peels back  . Topamax [Topiramate]     MEMORY  . Entex Lq [Phenylephrine-Guaifenesin] Rash  . Fiorinal [Butalbital-Aspirin-Caffeine] Rash      Current Outpatient Prescriptions on File Prior to Visit  Medication Sig Dispense Refill  . ALPRAZolam (XANAX) 1 MG tablet TAKE 1/2-1 TABLET BY MOUTH THREE TIMES DAILY AS NEEDED  FOR ANXIETY OR SLEEP 90 tablet 1  . cholecalciferol (VITAMIN D) 1000 UNITS tablet Take 1,000 Units by mouth 2 (two) times daily.     . Cyanocobalamin (B-12 PO) Take 1 tablet by mouth daily.    Marland Kitchen estradiol (ESTRACE) 0.5 MG tablet Take 0.5 mg by mouth daily.    Marland Kitchen levothyroxine (SYNTHROID, LEVOTHROID) 88 MCG tablet Take 88 mcg by mouth daily before  breakfast. Brand name only 1 QD    . lidocaine (LIDODERM) 5 % APP 1 PATCH AA OF SKIN FOR 12 H THEN TK THE PATCH OFF FOR 12 H  2  . Magnesium 500 MG CAPS Take 2 capsules by mouth daily.    . methocarbamol (ROBAXIN) 500 MG tablet Take 1 tablet (500 mg total) by mouth every 6 (six) hours as needed for muscle spasms. 60 tablet 1  . oxyCODONE-acetaminophen (PERCOCET/ROXICET) 5-325 MG per tablet Take 1-2 tablets by mouth every 4 (four) hours as needed for moderate pain. 80 tablet 0   No current facility-administered medications on file prior to visit.    ROS: all negative except above.   Physical Exam: Filed Weights   05/06/15 1519  Weight: 121 lb 3.2 oz (54.976 kg)   BP 116/68 mmHg  Pulse 76  Temp(Src) 97.9 F (36.6 C)  Resp 16  Ht 5' 0.5" (1.537 m)  Wt 121 lb 3.2 oz (54.976 kg)  BMI 23.27 kg/m2 General Appearance: Well nourished, in no apparent distress. Eyes: PERRLA, EOMs, conjunctiva no swelling or erythema Sinuses: No Frontal/maxillary tenderness ENT/Mouth: Ext aud canals clear, TMs without erythema, bulging. No erythema, swelling, or exudate on post pharynx.  Tonsils not swollen or erythematous. Hearing normal.  Neck: Supple, thyroid normal.  Respiratory: Respiratory effort normal, BS equal bilaterally without rales, rhonchi, wheezing or stridor.  Cardio: RRR with no MRGs. Brisk peripheral pulses without edema.  Abdomen: Soft, + BS, slight RLQ tenderness, no guarding, rebound, hernias, masses. Lymphatics: Non tender without lymphadenopathy.  Musculoskeletal: Full ROM, 5/5 strength, normal gait.  Skin: Well healing vertical surgical scar at L3-5 without erythema, warmth, or discharge. Warm, dry without rashes, lesions, ecchymosis.  Neuro: Cranial nerves intact. Normal muscle tone, no cerebellar symptoms. Sensation intact.  Psych: Awake and oriented X 3, normal affect, Insight and Judgment appropriate.     Vicie Mutters, PA-C 3:33 PM [facility]

## 2015-05-06 NOTE — Patient Instructions (Signed)

## 2015-05-07 LAB — URINALYSIS, ROUTINE W REFLEX MICROSCOPIC
Bilirubin Urine: NEGATIVE
GLUCOSE, UA: NEGATIVE
HGB URINE DIPSTICK: NEGATIVE
KETONES UR: NEGATIVE
Leukocytes, UA: NEGATIVE
Nitrite: NEGATIVE
PH: 7 (ref 5.0–8.0)
Protein, ur: NEGATIVE
Specific Gravity, Urine: 1.013 (ref 1.001–1.035)

## 2015-05-07 LAB — BASIC METABOLIC PANEL WITH GFR
BUN: 10 mg/dL (ref 7–25)
CHLORIDE: 105 mmol/L (ref 98–110)
CO2: 26 mmol/L (ref 20–31)
CREATININE: 0.65 mg/dL (ref 0.50–0.99)
Calcium: 9.1 mg/dL (ref 8.6–10.4)
GFR, Est African American: >89 mL/min (ref >=60)
GFR, Est Non African American: >89 mL/min (ref >=60)
Glucose, Bld: 97 mg/dL (ref 65–99)
Potassium: 4.1 mmol/L (ref 3.5–5.3)
Sodium: 143 mmol/L (ref 135–146)

## 2015-05-07 LAB — HEPATIC FUNCTION PANEL
ALBUMIN: 4.1 g/dL (ref 3.6–5.1)
ALT: 11 U/L (ref 6–29)
AST: 16 U/L (ref 10–35)
Alkaline Phosphatase: 47 U/L (ref 33–130)
BILIRUBIN TOTAL: 0.3 mg/dL (ref 0.2–1.2)
Bilirubin, Direct: <0.1 mg/dL (ref <=0.2)
Total Protein: 6.2 g/dL (ref 6.1–8.1)

## 2015-05-07 LAB — TSH: TSH: 2.786 u[IU]/mL (ref 0.350–4.500)

## 2015-05-07 LAB — MAGNESIUM: Magnesium: 1.8 mg/dL (ref 1.5–2.5)

## 2015-05-08 ENCOUNTER — Encounter: Payer: Self-pay | Admitting: Physician Assistant

## 2015-05-08 LAB — URINE CULTURE: Colony Count: 60000

## 2015-05-11 MED ORDER — CIPROFLOXACIN HCL 500 MG PO TABS: 500.0000 mg | ORAL_TABLET | Freq: Two times a day (BID) | ORAL | Status: DC

## 2015-05-11 NOTE — Addendum Note (Signed)
Addended by: Vicie Mutters R on: 05/11/2015 07:17 AM   Modules accepted: Orders

## 2015-06-03 ENCOUNTER — Other Ambulatory Visit: Payer: BC Managed Care – PPO

## 2015-06-03 DIAGNOSIS — R350 Frequency of micturition: Secondary | ICD-10-CM | POA: Diagnosis not present

## 2015-06-03 DIAGNOSIS — R300 Dysuria: Secondary | ICD-10-CM

## 2015-06-04 LAB — URINALYSIS, ROUTINE W REFLEX MICROSCOPIC
Bilirubin Urine: NEGATIVE
GLUCOSE, UA: NEGATIVE
HGB URINE DIPSTICK: NEGATIVE
Ketones, ur: NEGATIVE
Leukocytes, UA: NEGATIVE
Nitrite: NEGATIVE
PH: 6.5 (ref 5.0–8.0)
Protein, ur: NEGATIVE
Specific Gravity, Urine: 1.015 (ref 1.001–1.035)

## 2015-06-06 LAB — URINE CULTURE: Colony Count: 50000

## 2015-06-08 MED ORDER — NITROFURANTOIN MONOHYD MACRO 100 MG PO CAPS: 100.0000 mg | ORAL_CAPSULE | Freq: Two times a day (BID) | ORAL | Status: DC

## 2015-06-29 ENCOUNTER — Ambulatory Visit (INDEPENDENT_AMBULATORY_CARE_PROVIDER_SITE_OTHER): Payer: BC Managed Care – PPO | Admitting: Physician Assistant

## 2015-06-29 ENCOUNTER — Encounter: Payer: Self-pay | Admitting: Physician Assistant

## 2015-06-29 VITALS — BP 140/70 | HR 70 | Temp 97.9°F | Resp 16 | Ht 60.5 in | Wt 122.0 lb

## 2015-06-29 DIAGNOSIS — Z79899 Other long term (current) drug therapy: Secondary | ICD-10-CM | POA: Diagnosis not present

## 2015-06-29 DIAGNOSIS — E039 Hypothyroidism, unspecified: Secondary | ICD-10-CM | POA: Diagnosis not present

## 2015-06-29 DIAGNOSIS — Z23 Encounter for immunization: Secondary | ICD-10-CM | POA: Diagnosis not present

## 2015-06-29 DIAGNOSIS — R7303 Prediabetes: Secondary | ICD-10-CM | POA: Diagnosis not present

## 2015-06-29 DIAGNOSIS — E559 Vitamin D deficiency, unspecified: Secondary | ICD-10-CM

## 2015-06-29 DIAGNOSIS — D649 Anemia, unspecified: Secondary | ICD-10-CM

## 2015-06-29 DIAGNOSIS — E785 Hyperlipidemia, unspecified: Secondary | ICD-10-CM

## 2015-06-29 LAB — IRON AND TIBC
%SAT: 35 % (ref 11–50)
Iron: 105 ug/dL (ref 45–160)
TIBC: 296 ug/dL (ref 250–450)
UIBC: 191 ug/dL (ref 125–400)

## 2015-06-29 LAB — HEPATIC FUNCTION PANEL
ALT: 12 U/L (ref 6–29)
AST: 17 U/L (ref 10–35)
Albumin: 4.3 g/dL (ref 3.6–5.1)
Alkaline Phosphatase: 55 U/L (ref 33–130)
BILIRUBIN INDIRECT: 0.3 mg/dL (ref 0.2–1.2)
Bilirubin, Direct: 0.1 mg/dL (ref <=0.2)
Total Bilirubin: 0.4 mg/dL (ref 0.2–1.2)
Total Protein: 6.5 g/dL (ref 6.1–8.1)

## 2015-06-29 LAB — BASIC METABOLIC PANEL WITH GFR
BUN: 13 mg/dL (ref 7–25)
CO2: 28 mmol/L (ref 20–31)
CREATININE: 0.53 mg/dL (ref 0.50–0.99)
Calcium: 9.2 mg/dL (ref 8.6–10.4)
Chloride: 103 mmol/L (ref 98–110)
GFR, Est African American: >89 mL/min (ref >=60)
GFR, Est Non African American: >89 mL/min (ref >=60)
GLUCOSE: 106 mg/dL — AB (ref 65–99)
Potassium: 3.8 mmol/L (ref 3.5–5.3)
Sodium: 139 mmol/L (ref 135–146)

## 2015-06-29 LAB — LIPID PANEL
CHOL/HDL RATIO: 2.6 ratio (ref <=5.0)
Cholesterol: 170 mg/dL (ref 125–200)
HDL: 66 mg/dL (ref >=46)
LDL Cholesterol: 60 mg/dL (ref <130)
Triglycerides: 220 mg/dL — ABNORMAL HIGH (ref <150)
VLDL: 44 mg/dL — AB (ref <30)

## 2015-06-29 LAB — CBC WITH DIFFERENTIAL/PLATELET
BASOS PCT: 0 % (ref 0–1)
Basophils Absolute: 0 10*3/uL (ref 0.0–0.1)
Eosinophils Absolute: 0.2 10*3/uL (ref 0.0–0.7)
Eosinophils Relative: 4 % (ref 0–5)
HEMATOCRIT: 36.7 % (ref 36.0–46.0)
HEMOGLOBIN: 12.4 g/dL (ref 12.0–15.0)
LYMPHS ABS: 1.8 10*3/uL (ref 0.7–4.0)
LYMPHS PCT: 40 % (ref 12–46)
MCH: 29.7 pg (ref 26.0–34.0)
MCHC: 33.8 g/dL (ref 30.0–36.0)
MCV: 87.8 fL (ref 78.0–100.0)
MONO ABS: 0.4 10*3/uL (ref 0.1–1.0)
MONOS PCT: 8 % (ref 3–12)
MPV: 7.9 fL — ABNORMAL LOW (ref 8.6–12.4)
NEUTROS ABS: 2.2 10*3/uL (ref 1.7–7.7)
NEUTROS PCT: 48 % (ref 43–77)
Platelets: 327 10*3/uL (ref 150–400)
RBC: 4.18 MIL/uL (ref 3.87–5.11)
RDW: 13.1 % (ref 11.5–15.5)
WBC: 4.5 10*3/uL (ref 4.0–10.5)

## 2015-06-29 LAB — HEMOGLOBIN A1C
HEMOGLOBIN A1C: 5.5 % (ref <5.7)
MEAN PLASMA GLUCOSE: 111 mg/dL (ref <117)

## 2015-06-29 LAB — MAGNESIUM: MAGNESIUM: 1.7 mg/dL (ref 1.5–2.5)

## 2015-06-29 MED ORDER — ALPRAZOLAM 1 MG PO TABS: ORAL_TABLET | ORAL | Status: DC

## 2015-06-29 MED ORDER — MELOXICAM 15 MG PO TABS: ORAL_TABLET | ORAL | Status: DC
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
## 2015-06-30 LAB — VITAMIN D 25 HYDROXY (VIT D DEFICIENCY, FRACTURES): Vit D, 25-Hydroxy: 35 ng/mL (ref 30–100)

## 2015-06-30 LAB — FERRITIN: FERRITIN: 47 ng/mL (ref 10–291)

## 2015-06-30 LAB — TSH: TSH: 0.814 u[IU]/mL (ref 0.350–4.500)

## 2015-10-01 ENCOUNTER — Ambulatory Visit (INDEPENDENT_AMBULATORY_CARE_PROVIDER_SITE_OTHER): Payer: BC Managed Care – PPO | Admitting: Internal Medicine

## 2015-10-01 ENCOUNTER — Encounter: Payer: Self-pay | Admitting: Internal Medicine

## 2015-10-01 VITALS — BP 124/76 | HR 80 | Temp 98.2°F | Resp 18 | Ht 60.5 in | Wt 125.0 lb

## 2015-10-01 DIAGNOSIS — R300 Dysuria: Secondary | ICD-10-CM

## 2015-10-01 DIAGNOSIS — J019 Acute sinusitis, unspecified: Secondary | ICD-10-CM | POA: Diagnosis not present

## 2015-10-01 MED ORDER — PREDNISONE 20 MG PO TABS: ORAL_TABLET | ORAL | Status: DC

## 2015-10-01 MED ORDER — FLUTICASONE PROPIONATE 50 MCG/ACT NA SUSP: 2.0000 | Freq: Every day | NASAL | Status: DC

## 2015-10-01 MED ORDER — LEVOFLOXACIN 750 MG PO TABS: 750.0000 mg | ORAL_TABLET | Freq: Every day | ORAL | Status: DC

## 2015-10-01 NOTE — Patient Instructions (Signed)
Please continue to take allegra daily.  Please use 2 sprays per nostril of nasacort, flonase, or rhinocort daily.    Please use saline in your nose as often as tolerated.  Please take levaquin until gone.  Please take prednisone as prescribed.

## 2015-10-01 NOTE — Progress Notes (Signed)
Patient ID: Monique Thomas, female   DOB: [birth year], 65 y.o.   MRN: [id number]  HPI  Patient presents to the office for evaluation of URI.  It has been going on for 6 days.  Patient reports no cough.  They also endorse postnasal drip and nasal congestion, sinus pressure, bilateral ear pain, left glands are sore.  .  They have tried sinoff, allegra, aleve.  They report that nothing has worked.  They denies other sick contacts.  Review of Systems  Constitutional: Positive for malaise/fatigue. Negative for fever and chills.  HENT: Positive for congestion and ear pain. Negative for sore throat.   Respiratory: Positive for cough. Negative for sputum production, shortness of breath and wheezing.   Cardiovascular: Negative for chest pain, palpitations and leg swelling.  Neurological: Positive for headaches.    PE:  Filed Vitals:   10/01/15 1530  BP: 124/76  Pulse: 80  Temp: 98.2 F (36.8 C)  Resp: 18   General:  Alert and non-toxic, WDWN, NAD HEENT: NCAT, PERLA, EOM normal, no occular discharge or erythema.  Nasal mucosal edema with sinus tenderness to palpation.  Oropharynx clear with minimal oropharyngeal edema and erythema.  Mucous membranes moist and pink. Neck:  Cervical adenopathy Chest:  RRR no MRGs.  Lungs clear to auscultation A&P with no wheezes rhonchi or rales.   Abdomen: +BS x 4 quadrants, soft, non-tender, no guarding, rigidity, or rebound. Skin: warm and dry no rash Neuro: A&Ox4, CN II-XII grossly intact  Assessment and Plan:   1. Dysuria  - Urinalysis, Reflex Microscopic - Culture, Urine  2. Acute sinusitis, recurrence not specified, unspecified location  - predniSONE (DELTASONE) 20 MG tablet; 3 tabs po daily x 3 days, then 2 tabs x 3 days, then 1.5 tabs x 3 days, then 1 tab x 3 days, then 0.5 tabs x 3 days  Dispense: 27 tablet; Refill: 0 - levofloxacin (LEVAQUIN) 750 MG tablet; Take 1 tablet (750 mg total) by mouth daily. X 7 days  Dispense: 7 tablet; Refill: 0 -  fluticasone (FLONASE) 50 MCG/ACT nasal spray; Place 2 sprays into both nostrils daily.  Dispense: 16 g; Refill: 0

## 2015-10-02 LAB — URINALYSIS, ROUTINE W REFLEX MICROSCOPIC
Bilirubin Urine: NEGATIVE
Glucose, UA: NEGATIVE
Hgb urine dipstick: NEGATIVE
Ketones, ur: NEGATIVE
LEUKOCYTES UA: NEGATIVE
Nitrite: NEGATIVE
PH: 7 (ref 5.0–8.0)
Protein, ur: NEGATIVE
SPECIFIC GRAVITY, URINE: 1.022 (ref 1.001–1.035)

## 2015-10-03 LAB — URINE CULTURE: Colony Count: >=100000

## 2015-10-05 ENCOUNTER — Encounter: Payer: Self-pay | Admitting: Emergency Medicine

## 2015-10-06 ENCOUNTER — Other Ambulatory Visit: Payer: Self-pay

## 2015-10-07 ENCOUNTER — Ambulatory Visit (INDEPENDENT_AMBULATORY_CARE_PROVIDER_SITE_OTHER): Payer: BC Managed Care – PPO | Admitting: Physician Assistant

## 2015-10-07 ENCOUNTER — Encounter: Payer: Self-pay | Admitting: Physician Assistant

## 2015-10-07 VITALS — BP 118/70 | HR 73 | Temp 97.5°F | Resp 14 | Ht 62.0 in | Wt 127.0 lb

## 2015-10-07 DIAGNOSIS — Z139 Encounter for screening, unspecified: Secondary | ICD-10-CM

## 2015-10-07 DIAGNOSIS — R7303 Prediabetes: Secondary | ICD-10-CM | POA: Diagnosis not present

## 2015-10-07 DIAGNOSIS — Z79899 Other long term (current) drug therapy: Secondary | ICD-10-CM

## 2015-10-07 DIAGNOSIS — I10 Essential (primary) hypertension: Secondary | ICD-10-CM

## 2015-10-07 DIAGNOSIS — E785 Hyperlipidemia, unspecified: Secondary | ICD-10-CM | POA: Diagnosis not present

## 2015-10-07 DIAGNOSIS — Z23 Encounter for immunization: Secondary | ICD-10-CM

## 2015-10-07 DIAGNOSIS — M549 Dorsalgia, unspecified: Secondary | ICD-10-CM

## 2015-10-07 DIAGNOSIS — K2100 Gastro-esophageal reflux disease with esophagitis, without bleeding: Secondary | ICD-10-CM

## 2015-10-07 DIAGNOSIS — G894 Chronic pain syndrome: Secondary | ICD-10-CM | POA: Diagnosis not present

## 2015-10-07 DIAGNOSIS — Z0001 Encounter for general adult medical examination with abnormal findings: Secondary | ICD-10-CM | POA: Diagnosis not present

## 2015-10-07 DIAGNOSIS — R6889 Other general symptoms and signs: Secondary | ICD-10-CM | POA: Diagnosis not present

## 2015-10-07 DIAGNOSIS — F419 Anxiety disorder, unspecified: Secondary | ICD-10-CM | POA: Diagnosis not present

## 2015-10-07 DIAGNOSIS — D649 Anemia, unspecified: Secondary | ICD-10-CM | POA: Diagnosis not present

## 2015-10-07 DIAGNOSIS — Z1389 Encounter for screening for other disorder: Secondary | ICD-10-CM

## 2015-10-07 DIAGNOSIS — G8929 Other chronic pain: Secondary | ICD-10-CM

## 2015-10-07 DIAGNOSIS — E039 Hypothyroidism, unspecified: Secondary | ICD-10-CM

## 2015-10-07 DIAGNOSIS — J0101 Acute recurrent maxillary sinusitis: Secondary | ICD-10-CM

## 2015-10-07 DIAGNOSIS — E559 Vitamin D deficiency, unspecified: Secondary | ICD-10-CM | POA: Diagnosis not present

## 2015-10-07 DIAGNOSIS — Z981 Arthrodesis status: Secondary | ICD-10-CM

## 2015-10-07 LAB — LIPID PANEL
CHOL/HDL RATIO: 1.7 ratio (ref <=5.0)
Cholesterol: 176 mg/dL (ref 125–200)
HDL: 103 mg/dL (ref >=46)
LDL Cholesterol: 57 mg/dL (ref <130)
Triglycerides: 79 mg/dL (ref <150)
VLDL: 16 mg/dL (ref <30)

## 2015-10-07 LAB — CBC WITH DIFFERENTIAL/PLATELET
BASOS ABS: 0 10*3/uL (ref 0.0–0.1)
BASOS PCT: 0 % (ref 0–1)
EOS ABS: 0 10*3/uL (ref 0.0–0.7)
Eosinophils Relative: 0 % (ref 0–5)
HCT: 36.9 % (ref 36.0–46.0)
HEMOGLOBIN: 12.4 g/dL (ref 12.0–15.0)
Lymphocytes Relative: 48 % — ABNORMAL HIGH (ref 12–46)
Lymphs Abs: 3.8 10*3/uL (ref 0.7–4.0)
MCH: 30.2 pg (ref 26.0–34.0)
MCHC: 33.6 g/dL (ref 30.0–36.0)
MCV: 90 fL (ref 78.0–100.0)
MONOS PCT: 9 % (ref 3–12)
MPV: 7.8 fL — AB (ref 8.6–12.4)
Monocytes Absolute: 0.7 10*3/uL (ref 0.1–1.0)
NEUTROS ABS: 3.4 10*3/uL (ref 1.7–7.7)
NEUTROS PCT: 43 % (ref 43–77)
PLATELETS: 365 10*3/uL (ref 150–400)
RBC: 4.1 MIL/uL (ref 3.87–5.11)
RDW: 14.4 % (ref 11.5–15.5)
WBC: 8 10*3/uL (ref 4.0–10.5)

## 2015-10-07 LAB — HEPATIC FUNCTION PANEL
ALK PHOS: 46 U/L (ref 33–130)
ALT: 14 U/L (ref 6–29)
AST: 16 U/L (ref 10–35)
Albumin: 4 g/dL (ref 3.6–5.1)
BILIRUBIN DIRECT: 0.1 mg/dL (ref <=0.2)
Indirect Bilirubin: 0.3 mg/dL (ref 0.2–1.2)
Total Bilirubin: 0.4 mg/dL (ref 0.2–1.2)
Total Protein: 6.4 g/dL (ref 6.1–8.1)

## 2015-10-07 LAB — BASIC METABOLIC PANEL WITH GFR
BUN: 15 mg/dL (ref 7–25)
CHLORIDE: 104 mmol/L (ref 98–110)
CO2: 27 mmol/L (ref 20–31)
CREATININE: 0.64 mg/dL (ref 0.50–0.99)
Calcium: 9.2 mg/dL (ref 8.6–10.4)
GFR, Est African American: >89 mL/min (ref >=60)
GFR, Est Non African American: >89 mL/min (ref >=60)
Glucose, Bld: 84 mg/dL (ref 65–99)
POTASSIUM: 3.6 mmol/L (ref 3.5–5.3)
SODIUM: 142 mmol/L (ref 135–146)

## 2015-10-07 LAB — MAGNESIUM: Magnesium: 1.8 mg/dL (ref 1.5–2.5)

## 2015-10-07 MED ORDER — DOXYCYCLINE HYCLATE 100 MG PO TABS: 100.0000 mg | ORAL_TABLET | Freq: Two times a day (BID) | ORAL | Status: DC

## 2015-10-07 MED ORDER — ALPRAZOLAM 1 MG PO TABS: ORAL_TABLET | ORAL | Status: DC

## 2015-10-07 NOTE — Patient Instructions (Signed)

## 2015-10-07 NOTE — Progress Notes (Signed)
Complete Physical  Assessment and Plan: 1. Hypothyroidism, unspecified hypothyroidism type - TSH  2. Hyperlipidemia - CBC with Differential/Platelet - BASIC METABOLIC PANEL WITH GFR - Hepatic function panel - Lipid panel - EKG 12-Lead  3. Prediabetes - Hemoglobin A1c  4. Chronic pain syndrome Continue follow up  5. Back pain, chronic Continue follow up  6. Medication management - Magnesium  7. Vitamin D deficiency - VITAMIN D 25 Hydroxy (Vit-D Deficiency, Fractures)  8. S/P lumbar spinal fusion improved  9. Gastroesophageal reflux disease with esophagitis Continue PPI/H2 blocker, diet discussed  10. Anxiety Declines depression meds at this time, no SI/HI - ALPRAZolam (XANAX) 1 MG tablet; TAKE 1/2-1 TABLET BY MOUTH THREE TIMES DAILY AS NEEDED FOR ANXIETY OR SLEEP  Dispense: 90 tablet; Refill: 1  11. Anemia, unspecified anemia type - CBC with Differential/Platelet  12. Acute recurrent maxillary sinusitis - doxycycline (VIBRA-TABS) 100 MG tablet; Take 1 tablet (100 mg total) by mouth 2 (two) times daily.  Dispense: 20 tablet; Refill: 0  13. Encounter for general adult medical examination with abnormal findings - CBC with Differential/Platelet - BASIC METABOLIC PANEL WITH GFR - Hepatic function panel - TSH - Lipid panel - Hemoglobin A1c - Magnesium - VITAMIN D 25 Hydroxy (Vit-D Deficiency, Fractures) - Urinalysis, Routine w reflex microscopic (not at [facility]) - Microalbumin / creatinine urine ratio - EKG 12-Lead  14. Screening for blood or protein in urine - Urinalysis, Routine w reflex microscopic (not at [facility]) - Microalbumin / creatinine urine ratio  15. Need for prophylactic vaccination with combined diphtheria-tetanus-pertussis (DTP) vaccine - Tdap vaccine greater than or equal to 7yo IM  Discussed med's effects and SE's. Screening labs and tests as requested with regular follow-up as recommended. Over 40 minutes of exam, counseling, chart review, and  complex, high level critical decision making was performed this visit.   HPI  65 y.o. female  presents for a complete physical.  Her blood pressure has been controlled at home, today their BP is BP: 118/70 mmHg She does not workout. She denies chest pain, shortness of breath, dizziness.  She is not on cholesterol medication and denies myalgias. Her cholesterol is at goal. The cholesterol last visit was:   Lab Results  Component Value Date   CHOL 170 06/29/2015   HDL 66 06/29/2015   LDLCALC 60 06/29/2015   TRIG 220* 06/29/2015   CHOLHDL 2.6 06/29/2015   She has been working on diet and exercise for prediabetes,  and denies paresthesia of the feet, polydipsia, polyuria and visual disturbances. Last A1C in the office was:  Lab Results  Component Value Date   HGBA1C 5.5 06/29/2015  Patient is on Vitamin D supplement.   Lab Results  Component Value Date   VD25OH 35 06/29/2015     She is on thyroid medication. Her medication was not changed last visit.   Lab Results  Component Value Date   TSH 0.814 06/29/2015   She has chronic lower back pain, follows with pain management. She is on gabapentin and oxycodone. She had lumbar surgery on 04/16/2015 with Dr. Ronnald Ramp, states pain is better at times but still has some nerve pain. She has recurrent UTI's and bladder spasms, follows with urology, and has history of kidney stones. She has depression and is on xanax PRN, declines SSRI/maintenance medication, Strongly encouraged counseling or medication, no SI/HI.  Treated recently for sinus infection with levaquin and prednisone but states she is not any better.   Current Medications:  Current Outpatient Prescriptions on  File Prior to Visit  Medication Sig Dispense Refill  . ALPRAZolam (XANAX) 1 MG tablet TAKE 1/2-1 TABLET BY MOUTH THREE TIMES DAILY AS NEEDED FOR ANXIETY OR SLEEP 90 tablet 1  . cholecalciferol (VITAMIN D) 1000 UNITS tablet Take 1,000 Units by mouth 2 (two) times daily.     .  Cyanocobalamin (B-12 PO) Take 1 tablet by mouth daily.    . cyclobenzaprine (FLEXERIL) 5 MG tablet TK 1 T PO TID PRN  2  . estradiol (ESTRACE) 0.5 MG tablet Take 0.5 mg by mouth daily.    . fluticasone (FLONASE) 50 MCG/ACT nasal spray Place 2 sprays into both nostrils daily. 16 g 0  . levofloxacin (LEVAQUIN) 750 MG tablet Take 1 tablet (750 mg total) by mouth daily. X 7 days 7 tablet 0  . levothyroxine (SYNTHROID, LEVOTHROID) 88 MCG tablet Take 88 mcg by mouth daily before breakfast. Brand name only 1 QD    . lidocaine (LIDODERM) 5 % APP 1 PATCH AA OF SKIN FOR 12 H THEN TK THE PATCH OFF FOR 12 H  2  . Magnesium 500 MG CAPS Take 2 capsules by mouth daily.    . meloxicam (MOBIC) 15 MG tablet   3  . predniSONE (DELTASONE) 20 MG tablet 3 tabs po daily x 3 days, then 2 tabs x 3 days, then 1.5 tabs x 3 days, then 1 tab x 3 days, then 0.5 tabs x 3 days 27 tablet 0   No current facility-administered medications on file prior to visit.   Health Maintenance:   Immunization History  Administered Date(s) Administered  . Influenza Split 06/29/2015  . Influenza Whole 05/21/2013  . PPD Test 09/23/2013, 10/01/2014  . Pneumococcal Polysaccharide-23 09/23/2013  . Td 09/19/2005  . Zoster 03/27/2014   Tetanus: 2007 DUE Pneumovax: 2015 Prevnar 13: N/A Flu vaccine: 2016 Zostavax: 2015 LMP: s/p hysterectomy Pap: 07/2015 normal at [facility] MGM: 07/2015 at [facility]: 2008 Colonoscopy: 2008 due 2018  Last Dental Exam: q 6 months Last Eye Exam: 08/2015, will have cataract surgery both eyes, Dr. Einar Gip, Dr. Talbert Forest will do surgery.  Patient Care Team: [name], MD as PCP - General (Internal Medicine) Darlis Loan, MD as Referring Physician (Gastroenterology) Vania Rea, MD as Consulting Physician (Obstetrics and Gynecology) Carolan Clines, MD as Consulting Physician (Urology) Carlynn Spry, PA-C as Physician Assistant (Orthopedic Surgery) Lavonna Monarch, MD as Consulting Physician  (Dermatology) Gatha Mayer, MD as Consulting Physician (Gastroenterology) Webb Laws, OD as Referring Physician (Optometry)  Allergies:  Allergies  Allergen Reactions  . Celebrex [Celecoxib]     "paniac attack"  . Other     Skin bruises very easily and peels back  . Topamax [Topiramate]     MEMORY  . Entex Lq [Phenylephrine-Guaifenesin] Rash  . Fiorinal [Butalbital-Aspirin-Caffeine] Rash   Medical History:  Past Medical History  Diagnosis Date  . Hyperlipidemia   . Thyroid disease   . GERD (gastroesophageal reflux disease)   . Anemia   . Anxiety   . Back pain, chronic   . Unspecified vitamin D deficiency   . Kidney stone     2015  . Umbilical hernia   . Hypothyroidism   . Depression   . Arthritis    Surgical History:  Past Surgical History  Procedure Laterality Date  . Abdominal hysterectomy    . Cesarean section    . Hernia repair    . Appendectomy    . Tonsillectomy and adenoidectomy    . Spine surgery  lumbar  . Laparoscopic lysis intestinal adhesions    . Maximum access (mas)posterior lumbar interbody fusion (plif) 1 level N/A 04/16/2015    Procedure: masPLIF  - L2-L3 ;  Surgeon: Eustace Moore, MD;  Location: [place] [facility];  Service: Neurosurgery;  Laterality: N/A;  masPLIF  - L2-L3    Family History:  Family History  Problem Relation Age of Onset  . Heart disease Mother   . Hyperlipidemia Mother   . Thyroid disease Mother   . Asthma Sister   . Cancer Maternal Grandmother     ovarian   Social History:  Social History  Substance Use Topics  . Smoking status: Never Smoker   . Smokeless tobacco: None  . Alcohol Use: No    Review of Systems: Review of Systems  Constitutional: Negative for fever, chills, malaise/fatigue and diaphoresis.  HENT: Positive for congestion, ear pain, sore throat and tinnitus. Negative for nosebleeds.   Eyes: Negative.   Respiratory: Negative.  Negative for stridor.   Cardiovascular: Negative.    Gastrointestinal: Negative for heartburn, nausea, vomiting, abdominal pain, diarrhea, constipation and blood in stool.  Genitourinary: Negative.   Musculoskeletal: Positive for back pain.  Skin: Negative.  Negative for rash.  Neurological: Negative.  Negative for dizziness and headaches.  Psychiatric/Behavioral: The patient is nervous/anxious.     Physical Exam: Estimated body mass index is 23.22 kg/(m^2) as calculated from the following:   Height as of this encounter: 5\' 2"  (1.575 m).   Weight as of this encounter: 127 lb (57.607 kg). BP 118/70 mmHg  Pulse 73  Temp(Src) 97.5 F (36.4 C) (Temporal)  Resp 14  Ht 5\' 2"  (1.575 m)  Wt 127 lb (57.607 kg)  BMI 23.22 kg/m2  SpO2 96% General Appearance: Well nourished, in no apparent distress.  Eyes: PERRLA, EOMs, conjunctiva no swelling or erythema, normal fundi and vessels.  Sinuses: No Frontal/maxillary tenderness  ENT/Mouth: Ext aud canals clear, normal light reflex with TMs without erythema, bulging. Good dentition. No erythema, swelling, or exudate on post pharynx. Tonsils not swollen or erythematous. Hearing normal.  Neck: Supple, thyroid normal. No bruits  Respiratory: Respiratory effort normal, BS equal bilaterally without rales, rhonchi, wheezing or stridor.  Cardio: RRR without murmurs, rubs or gallops. Brisk peripheral pulses without edema.  Chest: symmetric, with normal excursions and percussion.  Breasts: Symmetric, without lumps, nipple discharge, retractions.  Abdomen: Soft, nontender, no guarding, rebound, hernias, masses, or organomegaly.  Lymphatics: Non tender without lymphadenopathy.  Genitourinary:  Musculoskeletal: Full ROM all peripheral extremities,5/5 strength, and normal gait.  Skin: Warm, dry without rashes, lesions, ecchymosis. Neuro: Cranial nerves intact, reflexes equal bilaterally. Normal muscle tone, no cerebellar symptoms. Sensation intact.  Psych: Awake and oriented X 3, normal affect, Insight and  Judgment appropriate.   EKG: WNL no ST changes. AORTA SCAN: declines  Vicie Mutters 10:10 AM [facility]

## 2015-10-08 LAB — URINALYSIS, ROUTINE W REFLEX MICROSCOPIC
Bilirubin Urine: NEGATIVE
GLUCOSE, UA: NEGATIVE
HGB URINE DIPSTICK: NEGATIVE
Ketones, ur: NEGATIVE
LEUKOCYTES UA: NEGATIVE
NITRITE: NEGATIVE
PH: 7 (ref 5.0–8.0)
Protein, ur: NEGATIVE
SPECIFIC GRAVITY, URINE: 1.004 (ref 1.001–1.035)

## 2015-10-08 LAB — HEMOGLOBIN A1C
HEMOGLOBIN A1C: 5.5 % (ref <5.7)
Mean Plasma Glucose: 111 mg/dL (ref <117)

## 2015-10-08 LAB — MICROALBUMIN / CREATININE URINE RATIO: Creatinine, Urine: 24 mg/dL (ref 20–320)

## 2015-10-08 LAB — TSH: TSH: 5.15 u[IU]/mL — ABNORMAL HIGH (ref 0.350–4.500)

## 2015-10-08 LAB — VITAMIN D 25 HYDROXY (VIT D DEFICIENCY, FRACTURES): Vit D, 25-Hydroxy: 41 ng/mL (ref 30–100)

## 2015-10-10 ENCOUNTER — Encounter: Payer: Self-pay | Admitting: *Deleted

## 2015-11-04 ENCOUNTER — Other Ambulatory Visit: Payer: BC Managed Care – PPO

## 2015-11-04 DIAGNOSIS — E039 Hypothyroidism, unspecified: Secondary | ICD-10-CM

## 2015-11-04 LAB — TSH: TSH: 1.76 m[IU]/L

## 2015-11-05 ENCOUNTER — Ambulatory Visit (INDEPENDENT_AMBULATORY_CARE_PROVIDER_SITE_OTHER): Payer: BC Managed Care – PPO | Admitting: Physician Assistant

## 2015-11-05 ENCOUNTER — Other Ambulatory Visit: Payer: Self-pay

## 2015-11-05 ENCOUNTER — Encounter: Payer: Self-pay | Admitting: Physician Assistant

## 2015-11-05 VITALS — BP 100/70 | HR 85 | Temp 97.7°F | Resp 16 | Ht 62.0 in | Wt 127.8 lb

## 2015-11-05 DIAGNOSIS — G2581 Restless legs syndrome: Secondary | ICD-10-CM

## 2015-11-05 DIAGNOSIS — M549 Dorsalgia, unspecified: Secondary | ICD-10-CM | POA: Diagnosis not present

## 2015-11-05 DIAGNOSIS — J320 Chronic maxillary sinusitis: Secondary | ICD-10-CM

## 2015-11-05 DIAGNOSIS — G8929 Other chronic pain: Secondary | ICD-10-CM | POA: Diagnosis not present

## 2015-11-05 DIAGNOSIS — M26609 Unspecified temporomandibular joint disorder, unspecified side: Secondary | ICD-10-CM

## 2015-11-05 MED ORDER — AMOXICILLIN-POT CLAVULANATE 875-125 MG PO TABS: 1.0000 | ORAL_TABLET | Freq: Two times a day (BID) | ORAL | Status: DC

## 2015-11-05 MED ORDER — CYCLOBENZAPRINE HCL 5 MG PO TABS: 5.0000 mg | ORAL_TABLET | Freq: Three times a day (TID) | ORAL | Status: DC | PRN

## 2015-11-05 NOTE — Patient Instructions (Addendum)
SUGGEST FOLLOWING UP WITH COUNSELOR!  What is the TMJ? The temporomandibular (tem-PUH-ro-man-DIB-yoo-ler) joint, or the TMJ, connects the upper and lower jawbones. This joint allows the jaw to open wide and move back and forth when you chew, talk, or yawn.There are also several muscles that help this joint move. There can be muscle tightness and pain in the muscle that can cause several symptoms.  What causes TMJ pain? There are many causes of TMJ pain. Repeated chewing (for example, chewing gum) and clenching your teeth can cause pain in the joint. Some TMJ pain has no obvious cause. What can I do to ease the pain? There are many things you can do to help your pain get better. When you have pain:  Eat soft foods and stay away from chewy foods (for example, taffy) Try to use both sides of your mouth to chew Don't chew gum Massage Don't open your mouth wide (for example, during yawning or singing) Don't bite your cheeks or fingernails Lower your amount of stress and worry Applying a warm, damp washcloth to the joint may help. Over-the-counter pain medicines such as ibuprofen (one brand: Advil) or acetaminophen (one brand: Tylenol) might also help. Do not use these medicines if you are allergic to them or if your doctor told you not to use them. How can I stop the pain from coming back? When your pain is better, you can do these exercises to make your muscles stronger and to keep the pain from coming back:  Resisted mouth opening: Place your thumb or two fingers under your chin and open your mouth slowly, pushing up lightly on your chin with your thumb. Hold for three to six seconds. Close your mouth slowly. Resisted mouth closing: Place your thumbs under your chin and your two index fingers on the ridge between your mouth and the bottom of your chin. Push down lightly on your chin as you close your mouth. Tongue up: Slowly open and close your mouth while keeping the tongue touching the roof of  the mouth. Side-to-side jaw movement: Place an object about one fourth of an inch thick (for example, two tongue depressors) between your front teeth. Slowly move your jaw from side to side. Increase the thickness of the object as the exercise becomes easier Forward jaw movement: Place an object about one fourth of an inch thick between your front teeth and move the bottom jaw forward so that the bottom teeth are in front of the top teeth. Increase the thickness of the object as the exercise becomes easier. These exercises should not be painful. If it hurts to do these exercises, stop doing them and talk to your family doctor.     Restless Legs Syndrome Restless legs syndrome is a condition that causes uncomfortable feelings or sensations in the legs, especially while sitting or lying down. The sensations usually cause an overwhelming urge to move the legs. The arms can also sometimes be affected. The condition can range from mild to severe. The symptoms often interfere with a person's ability to sleep. CAUSES The cause of this condition is not known. RISK FACTORS This condition is more likely to develop in:  People who are older than age 60.  Pregnant women. In general, restless legs syndrome is more common in women than in men.  People who have a family history of the condition.  People who have certain medical conditions, such as iron deficiency, kidney disease, Parkinson disease, or nerve damage.  People who take certain medicines,  such as medicines for high blood pressure, nausea, colds, allergies, depression, and some heart conditions. SYMPTOMS The main symptom of this condition is uncomfortable sensations in the legs. These sensations may be:  Described as pulling, tingling, prickling, throbbing, crawling, or burning.  Worse while you are sitting or lying down.  Worse during periods of rest or inactivity.  Worse at night, often interfering with your sleep.  Accompanied by a  very strong urge to move your legs.  Temporarily relieved by movement of your legs. The sensations usually affect both sides of the body. The arms can also be affected, but this is rare. People who have this condition often have tiredness during the day because of their lack of sleep at night. DIAGNOSIS This condition may be diagnosed based on your description of the symptoms. You may also have tests, including blood tests, to check for other conditions that may lead to your symptoms. In some cases, you may be asked to spend some time in a sleep lab so your sleeping can be monitored. TREATMENT Treatment for this condition is focused on managing the symptoms. Treatment may include:  Self-help and lifestyle changes.  Medicines. HOME CARE INSTRUCTIONS  Take medicines only as directed by your health care provider.  Try these methods to get temporary relief from the uncomfortable sensations:  Massage your legs.  Walk or stretch.  Take a cold or hot bath.  Practice good sleep habits. For example, go to bed and get up at the same time every day.  Exercise regularly.  Practice ways of relaxing, such as yoga or meditation.  Avoid caffeine and alcohol.  Do not use any tobacco products, including cigarettes, chewing tobacco, or electronic cigarettes. If you need help quitting, ask your health care provider.  Keep all follow-up visits as directed by your health care provider. This is important. SEEK MEDICAL CARE IF: Your symptoms do not improve with treatment, or they get worse.   This information is not intended to replace advice given to you by your health care provider. Make sure you discuss any questions you have with your health care provider.   Document Released: 08/26/2002 Document Revised: 01/20/2015 Document Reviewed: 09/01/2014 Elsevier Interactive Patient Education [employer].

## 2015-11-05 NOTE — Progress Notes (Signed)
Subjective:    Patient ID: Monique Thomas, female    DOB: 01/08/1951, 65 y.o.   MRN: [id number]  HPI 65 y.o. WF s/p recent lumbar fusion in July presents with bilateral leg pain and continuing sinus infection symptoms. She was seen for CPE on 10/07/2015 and was treated with doxycycline for sinusitis, and was treated for levaquin before that.  She complains of bilateral leg and calf tingling worse at night, will take aleve that will help some, could not tolerate the gabapentin. Her mag was 1.8, she has been on 1000mg  daily.  Lab Results  Component Value Date   IRON 105 06/29/2015   TIBC 296 06/29/2015   FERRITIN 47 06/29/2015    Blood pressure 100/70, pulse 85, temperature 97.7 F (36.5 C), temperature source Temporal, resp. rate 16, height 5\' 2"  (1.575 m), weight 127 lb 12.8 oz (57.97 kg), SpO2 98 %.  Past Medical History  Diagnosis Date  . Hyperlipidemia   . Thyroid disease   . GERD (gastroesophageal reflux disease)   . Anemia   . Anxiety   . Back pain, chronic   . Unspecified vitamin D deficiency   . Kidney stone     2015  . Umbilical hernia   . Hypothyroidism   . Depression   . Arthritis    Review of Systems  Constitutional: Negative for fever, chills and diaphoresis.  HENT: Positive for congestion, ear pain, sore throat and tinnitus. Negative for nosebleeds.   Eyes: Negative.   Respiratory: Negative.  Negative for stridor.   Cardiovascular: Negative.   Gastrointestinal: Negative for nausea, vomiting, abdominal pain, diarrhea, constipation and blood in stool.  Genitourinary: Negative.   Musculoskeletal: Positive for back pain.  Skin: Negative.  Negative for rash.  Neurological: Negative.  Negative for dizziness and headaches.  Psychiatric/Behavioral: Positive for dysphoric mood. Negative for suicidal ideas and self-injury. The patient is nervous/anxious.        Objective:   Physical Exam  Constitutional: She is oriented to person, place, and time. She appears  well-developed and well-nourished.  HENT:  Nose: Right sinus exhibits maxillary sinus tenderness. Left sinus exhibits maxillary sinus tenderness.  Mouth/Throat: Mucous membranes are normal. No trismus in the jaw. No dental abscesses. No oropharyngeal exudate, posterior oropharyngeal edema, posterior oropharyngeal erythema or tonsillar abscesses.  + TMJ tenderness bilateral   Cardiovascular: Normal rate and regular rhythm.   Pulmonary/Chest: Effort normal and breath sounds normal.  Abdominal: Soft. Bowel sounds are normal. There is no tenderness. There is no rebound.  Musculoskeletal:  Patient is able to ambulate well.   Gait is  antalgic. Straight leg raising with dorsiflexion negative bilaterally for radicular symptoms. Sensory exam in the legs are normal.  Knee reflexes are normal Ankle reflexes are normal Strength is normal and symmetric in arms and legs. There isparaspinal muscle spasm. + left SI tenderness   There is not midline tenderness.   ROM of spine with normal flexion, extension, lateral range of motion to the right and left, and rotation to the right and left.    Neurological: She is alert and oriented to person, place, and time. She has normal reflexes.  Skin: Skin is warm and dry. No rash noted.       Assessment & Plan:  Sinus pain ? TMJ- flexeril, soft food, follow up dentist. Augmentin if not better go to ENT  Leg pain Increase magnesium to 1000mg  a day Felxeril as needed Declines meds  Depression/grief from husband/parents Suggest counseling, denies SI/HI, and declines  meds  Future Appointments Date Time Provider [place]  04/06/2016 10:30 AM Vicie Mutters, PA-C [facility] None  10/10/2016 10:00 AM Vicie Mutters, PA-C [facility] None

## 2016-02-26 ENCOUNTER — Encounter: Payer: Self-pay | Admitting: Internal Medicine

## 2016-02-26 ENCOUNTER — Ambulatory Visit (INDEPENDENT_AMBULATORY_CARE_PROVIDER_SITE_OTHER): Payer: Medicare Other | Admitting: Internal Medicine

## 2016-02-26 VITALS — BP 130/80 | HR 98 | Temp 98.2°F | Resp 18 | Ht 62.0 in | Wt 127.0 lb

## 2016-02-26 DIAGNOSIS — R7303 Prediabetes: Secondary | ICD-10-CM

## 2016-02-26 DIAGNOSIS — Z79899 Other long term (current) drug therapy: Secondary | ICD-10-CM | POA: Diagnosis not present

## 2016-02-26 DIAGNOSIS — F419 Anxiety disorder, unspecified: Secondary | ICD-10-CM | POA: Diagnosis not present

## 2016-02-26 DIAGNOSIS — E785 Hyperlipidemia, unspecified: Secondary | ICD-10-CM | POA: Diagnosis not present

## 2016-02-26 DIAGNOSIS — E039 Hypothyroidism, unspecified: Secondary | ICD-10-CM | POA: Diagnosis not present

## 2016-02-26 DIAGNOSIS — E559 Vitamin D deficiency, unspecified: Secondary | ICD-10-CM

## 2016-02-26 DIAGNOSIS — Z111 Encounter for screening for respiratory tuberculosis: Secondary | ICD-10-CM

## 2016-02-26 LAB — BASIC METABOLIC PANEL WITH GFR
BUN: 18 mg/dL (ref 7–25)
CALCIUM: 9 mg/dL (ref 8.6–10.4)
CO2: 24 mmol/L (ref 20–31)
CREATININE: 0.7 mg/dL (ref 0.50–0.99)
Chloride: 108 mmol/L (ref 98–110)
GFR, Est Non African American: >89 mL/min (ref >=60)
GLUCOSE: 112 mg/dL — AB (ref 65–99)
POTASSIUM: 4 mmol/L (ref 3.5–5.3)
Sodium: 142 mmol/L (ref 135–146)

## 2016-02-26 LAB — HEPATIC FUNCTION PANEL
ALBUMIN: 4.2 g/dL (ref 3.6–5.1)
ALK PHOS: 42 U/L (ref 33–130)
ALT: 11 U/L (ref 6–29)
AST: 17 U/L (ref 10–35)
BILIRUBIN TOTAL: 0.5 mg/dL (ref 0.2–1.2)
Bilirubin, Direct: 0.1 mg/dL (ref <=0.2)
Indirect Bilirubin: 0.4 mg/dL (ref 0.2–1.2)
TOTAL PROTEIN: 6.7 g/dL (ref 6.1–8.1)

## 2016-02-26 LAB — CBC WITH DIFFERENTIAL/PLATELET
Basophils Absolute: 50 cells/uL (ref 0–200)
Basophils Relative: 1 %
Eosinophils Absolute: 50 cells/uL (ref 15–500)
Eosinophils Relative: 1 %
HEMATOCRIT: 35.6 % (ref 35.0–45.0)
Hemoglobin: 12 g/dL (ref 11.7–15.5)
LYMPHS ABS: 1700 {cells}/uL (ref 850–3900)
LYMPHS PCT: 34 %
MCH: 29.9 pg (ref 27.0–33.0)
MCHC: 33.7 g/dL (ref 32.0–36.0)
MCV: 88.6 fL (ref 80.0–100.0)
MONO ABS: 350 {cells}/uL (ref 200–950)
MPV: 8.2 fL (ref 7.5–12.5)
Monocytes Relative: 7 %
NEUTROS ABS: 2850 {cells}/uL (ref 1500–7800)
Neutrophils Relative %: 57 %
PLATELETS: 339 10*3/uL (ref 140–400)
RBC: 4.02 MIL/uL (ref 3.80–5.10)
RDW: 13.5 % (ref 11.0–15.0)
WBC: 5 10*3/uL (ref 3.8–10.8)

## 2016-02-26 LAB — LIPID PANEL
CHOLESTEROL: 163 mg/dL (ref 125–200)
HDL: 89 mg/dL (ref >=46)
LDL Cholesterol: 55 mg/dL (ref <130)
Total CHOL/HDL Ratio: 1.8 Ratio (ref <=5.0)
Triglycerides: 96 mg/dL (ref <150)
VLDL: 19 mg/dL (ref <30)

## 2016-02-26 LAB — HEMOGLOBIN A1C
HEMOGLOBIN A1C: 5.5 % (ref <5.7)
Mean Plasma Glucose: 111 mg/dL

## 2016-02-26 MED ORDER — ALPRAZOLAM 1 MG PO TABS: ORAL_TABLET | ORAL | Status: DC

## 2016-02-26 MED ORDER — CYCLOBENZAPRINE HCL 5 MG PO TABS: 5.0000 mg | ORAL_TABLET | Freq: Three times a day (TID) | ORAL | Status: DC | PRN

## 2016-02-26 NOTE — Progress Notes (Signed)
Assessment and Plan:  Hypertension:  -Continue medication,  -monitor blood pressure at home.  -Continue DASH diet.   -Reminder to go to the ER if any CP, SOB, nausea, dizziness, severe HA, changes vision/speech, left arm numbness and tingling, and jaw pain.  Cholesterol: -Continue diet and exercise.  -Check cholesterol.   Pre-diabetes: -Continue diet and exercise.  -Check A1C  Vitamin D Def: -check level -continue medications.   Need for TB screening -done here today  Hypothyroidism -TSH -cont levothyroxine.    Continue diet and meds as discussed. Further disposition pending results of labs.  HPI 65 y.o. female  presents for 3 month follow up with hypertension, hyperlipidemia, prediabetes and vitamin D.   Her blood pressure has been controlled at home, today their BP is BP: 130/80 mmHg.   She does workout. She denies chest pain, shortness of breath, dizziness.   She is on cholesterol medication and denies myalgias. Her cholesterol is at goal. The cholesterol last visit was:   Lab Results  Component Value Date   CHOL 176 10/07/2015   HDL 103 10/07/2015   LDLCALC 57 10/07/2015   TRIG 79 10/07/2015   CHOLHDL 1.7 10/07/2015     She has been working on diet and exercise for prediabetes, and denies foot ulcerations, hyperglycemia, hypoglycemia , increased appetite, nausea, paresthesia of the feet, polydipsia, polyuria, visual disturbances, vomiting and weight loss. Last A1C in the office was:  Lab Results  Component Value Date   HGBA1C 5.5 10/07/2015    Patient is on Vitamin D supplement.  Lab Results  Component Value Date   VD25OH 41 10/07/2015     She notes that she does have some joint aches and some pains.  She notes that she from time to time gets leg cramps and lower back pain.  She would like a refill on the flexeril as this helps with that.    She is doing 1 tablet of her thyroid tablet daily.  She is doing this on an empty stomach.    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  She does need a new TB test to do this.  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     Current Medications:  Current Outpatient Prescriptions on File Prior to Visit  Medication Sig Dispense Refill  . ALPRAZolam (XANAX) 1 MG tablet TAKE 1/2-1 TABLET BY MOUTH THREE TIMES DAILY AS NEEDED FOR ANXIETY OR SLEEP 90 tablet 1  . cholecalciferol (VITAMIN D) 1000 UNITS tablet Take 1,000 Units by mouth 2 (two) times daily.     . Cyanocobalamin (B-12 PO) Take 1 tablet by mouth daily.    . cyclobenzaprine (FLEXERIL) 5 MG tablet Take 1 tablet (5 mg total) by mouth every 8 (eight) hours as needed for muscle spasms. 60 tablet 1  . estradiol (ESTRACE) 0.5 MG tablet Take 0.5 mg by mouth daily.    . fluticasone (FLONASE) 50 MCG/ACT nasal spray Place 2 sprays into both nostrils daily. 16 g 0  . levothyroxine (SYNTHROID, LEVOTHROID) 88 MCG tablet Take 88 mcg by mouth daily before breakfast. Brand name only 1 QD    . lidocaine (LIDODERM) 5 % APP 1 PATCH AA OF SKIN FOR 12 H THEN TK THE PATCH OFF FOR 12 H  2  . Magnesium 500 MG CAPS Take 2 capsules by mouth daily.     No current facility-administered medications on file prior to visit.    Medical History:  Past  Medical History  Diagnosis Date  . Hyperlipidemia   . Thyroid disease   . GERD (gastroesophageal reflux disease)   . Anemia   . Anxiety   . Back pain, chronic   . Unspecified vitamin D deficiency   . Kidney stone     2015  . Umbilical hernia   . Hypothyroidism   . Depression   . Arthritis     Allergies:  Allergies  Allergen Reactions  . Celebrex [Celecoxib]     "paniac attack"  . Other     Skin bruises very easily and peels back  . Topamax [Topiramate]     MEMORY  . Entex Lq [Phenylephrine-Guaifenesin] Rash  . Fiorinal [Butalbital-Aspirin-Caffeine] Rash     Review of Systems:  Review of Systems  Constitutional: Negative for fever, chills and malaise/fatigue.  HENT: Negative for congestion,  ear pain and sore throat.   Eyes: Negative.   Respiratory: Negative for cough, shortness of breath and wheezing.   Cardiovascular: Negative for chest pain, palpitations and leg swelling.  Gastrointestinal: Negative for heartburn, abdominal pain, diarrhea, constipation, blood in stool and melena.  Genitourinary: Negative.   Musculoskeletal: Negative.   Skin: Negative.   Neurological: Negative for dizziness, sensory change, loss of consciousness and headaches.  Psychiatric/Behavioral: Negative for depression. The patient is not nervous/anxious and does not have insomnia.     Family history- Review and unchanged  Social history- Review and unchanged  Physical Exam: BP 130/80 mmHg  Pulse 98  Temp(Src) 98.2 F (36.8 C) (Temporal)  Resp 18  Ht 5\' 2"  (1.575 m)  Wt 127 lb (57.607 kg)  BMI 23.22 kg/m2 Wt Readings from Last 3 Encounters:  02/26/16 127 lb (57.607 kg)  11/05/15 127 lb 12.8 oz (57.97 kg)  10/07/15 127 lb (57.607 kg)    General Appearance: Well nourished well developed, in no apparent distress. Eyes: PERRLA, EOMs, conjunctiva no swelling or erythema ENT/Mouth: Ear canals normal without obstruction, swelling, erythma, discharge.  TMs normal bilaterally.  Oropharynx moist, clear, without exudate, or postoropharyngeal swelling. Neck: Supple, thyroid normal,no cervical adenopathy  Respiratory: Respiratory effort normal, Breath sounds clear A&P without rhonchi, wheeze, or rale.  No retractions, no accessory usage. Cardio: RRR with no MRGs. Brisk peripheral pulses without edema.  Abdomen: Soft, + BS,  Non tender, no guarding, rebound, hernias, masses. Musculoskeletal: Full ROM, 5/5 strength, Normal gait Skin: Warm, dry without rashes, lesions, ecchymosis.  Neuro: Awake and oriented X 3, Cranial nerves intact. Normal muscle tone, no cerebellar symptoms. Psych: Normal affect, Insight and Judgment appropriate.    Starlyn Skeans, PA-C 11:28 AM [facility]

## 2016-02-27 LAB — TSH: TSH: 1.63 mIU/L

## 2016-02-29 LAB — TB SKIN TEST
INDURATION: 0 mm
TB SKIN TEST: NEGATIVE

## 2016-03-02 ENCOUNTER — Encounter: Payer: Self-pay | Admitting: Internal Medicine

## 2016-03-02 ENCOUNTER — Ambulatory Visit (INDEPENDENT_AMBULATORY_CARE_PROVIDER_SITE_OTHER): Payer: Medicare Other | Admitting: Internal Medicine

## 2016-03-02 VITALS — BP 136/70 | HR 72 | Temp 98.2°F | Resp 18 | Ht 62.0 in | Wt 124.0 lb

## 2016-03-02 DIAGNOSIS — M25562 Pain in left knee: Secondary | ICD-10-CM

## 2016-03-02 DIAGNOSIS — B009 Herpesviral infection, unspecified: Secondary | ICD-10-CM

## 2016-03-02 MED ORDER — VALACYCLOVIR HCL 500 MG PO TABS: 500.0000 mg | ORAL_TABLET | Freq: Two times a day (BID) | ORAL | Status: AC

## 2016-03-02 MED ORDER — PREDNISONE 20 MG PO TABS: ORAL_TABLET | ORAL | Status: DC

## 2016-03-02 MED ORDER — TRAMADOL HCL 50 MG PO TABS: 50.0000 mg | ORAL_TABLET | Freq: Four times a day (QID) | ORAL | Status: DC | PRN

## 2016-03-02 NOTE — Progress Notes (Signed)
   Subjective:    Patient ID: Monique Thomas, female    DOB: 1950-09-24, 65 y.o.   MRN: [id number]  Knee Pain    Patient presents to the office for evaluation of left knee pain x 1 week.  She reports that the knee has been swollen.  She notes that her pain is an 8/10 pain.  She has not injured it at all.  She did spend Saturday walking around all day.  She does have a history of OA and is followed by Dr. Melvyn Novas.  She has not had an injection in 1 year.  She notes that this has happened before.  She also has a fever blister on her upper lip.  It has been there 3-4 days.  She has had fever blisters before.  This is the typical sensation.     Review of Systems  Constitutional: Negative for fever, chills and fatigue.  Respiratory: Negative for chest tightness and shortness of breath.   Gastrointestinal: Negative for nausea and vomiting.       Objective:   Physical Exam  Constitutional: She is oriented to person, place, and time. She appears well-developed and well-nourished. No distress.  HENT:  Head: Normocephalic.    Mouth/Throat: Oropharynx is clear and moist. No oropharyngeal exudate.  Eyes: Conjunctivae are normal. No scleral icterus.  Neck: Normal range of motion. Neck supple. No JVD present. No thyromegaly present.  Cardiovascular: Normal rate, regular rhythm, normal heart sounds and intact distal pulses.  Exam reveals no gallop and no friction rub.   No murmur heard. Pulmonary/Chest: Effort normal and breath sounds normal. No respiratory distress. She has no wheezes. She has no rales. She exhibits no tenderness.  Musculoskeletal:       Left knee: She exhibits swelling. She exhibits normal range of motion, no effusion, no ecchymosis, no deformity, no laceration, no erythema, normal alignment, no LCL laxity, normal patellar mobility, no bony tenderness, normal meniscus and no MCL laxity. No medial joint line, no lateral joint line, no MCL, no LCL and no patellar tendon tenderness  noted.  Positive patellar grind test, negative anterior and posterior drawer, negative mcmurrays  Lymphadenopathy:    She has no cervical adenopathy.  Neurological: She is alert and oriented to person, place, and time.  Skin: Skin is warm and dry. She is not diaphoretic.  Psychiatric: She has a normal mood and affect. Her behavior is normal. Judgment and thought content normal.  Nursing note and vitals reviewed.   Filed Vitals:   03/02/16 1025  BP: 136/70  Pulse: 72  Temp: 98.2 F (36.8 C)  Resp: 18          Assessment & Plan:    1. Left knee pain -if no relief will need another injection due to patellofemoral arthritis - traMADol (ULTRAM) 50 MG tablet; Take 1 tablet (50 mg total) by mouth every 6 (six) hours as needed.  Dispense: 60 tablet; Refill: 0 - predniSONE (DELTASONE) 20 MG tablet; 3 tabs po daily x 3 days, then 2 tabs x 3 days, then 1.5 tabs x 3 days, then 1 tab x 3 days, then 0.5 tabs x 3 days  Dispense: 27 tablet; Refill: 0  2. HSV-1 (herpes simplex virus 1) infection  - valACYclovir (VALTREX) 500 MG tablet; Take 1 tablet (500 mg total) by mouth 2 (two) times daily.  Dispense: 30 tablet; Refill: 0

## 2016-04-04 ENCOUNTER — Encounter: Payer: Self-pay | Admitting: Internal Medicine

## 2016-04-04 ENCOUNTER — Ambulatory Visit (INDEPENDENT_AMBULATORY_CARE_PROVIDER_SITE_OTHER): Payer: Medicare Other | Admitting: Internal Medicine

## 2016-04-04 VITALS — BP 118/70 | HR 82 | Temp 98.2°F | Resp 18 | Ht 62.0 in

## 2016-04-04 DIAGNOSIS — R1030 Lower abdominal pain, unspecified: Secondary | ICD-10-CM

## 2016-04-04 MED ORDER — CIPROFLOXACIN HCL 500 MG PO TABS: 500.0000 mg | ORAL_TABLET | Freq: Two times a day (BID) | ORAL | Status: AC

## 2016-04-04 MED ORDER — HYDROCODONE-ACETAMINOPHEN 5-325 MG PO TABS: 1.0000 | ORAL_TABLET | Freq: Four times a day (QID) | ORAL | Status: DC | PRN

## 2016-04-04 NOTE — Progress Notes (Signed)
   Subjective:    Patient ID: Monique Thomas, female    DOB: [birth year], 65 y.o.   MRN: [id number]  Back Pain Associated symptoms include abdominal pain. Pertinent negatives include no dysuria or fever.  Abdominal Pain Pertinent negatives include no constipation, diarrhea, dysuria, fever, frequency, hematuria, nausea or vomiting.  Patient presents to the office for low back pain and abdominal pain x 1 week.  Pain is aching and cramping and it is on bilateral lower quadrants.  She reports the pain is constant.  She also has constant back pain. She has tried [employer], she has tried tramadol and neither of these medications help.  She has also tried muscle relaxers.  No relief with flexeril either.  No aggravating factors.  No urine odor.  Pain is worse than when she has had kidney stones in the past.  Patient has a history of an apendectomy.  No diverticulitis/losis that she is aware of.  She does have chronic low back pain.     Review of Systems  Constitutional: Negative for fever, chills and fatigue.  Gastrointestinal: Positive for abdominal pain. Negative for nausea, vomiting, diarrhea and constipation.       Gas, and belching  Genitourinary: Negative for dysuria, urgency, frequency, hematuria, flank pain and difficulty urinating.  Musculoskeletal: Positive for back pain.  Neurological: Negative for dizziness and light-headedness.       Objective:   Physical Exam  Constitutional: She is oriented to person, place, and time. She appears well-developed and well-nourished. No distress.  HENT:  Head: Normocephalic.  Mouth/Throat: Oropharynx is clear and moist. No oropharyngeal exudate.  Eyes: Conjunctivae are normal. No scleral icterus.  Neck: Normal range of motion. Neck supple. No JVD present. No thyromegaly present.  Cardiovascular: Normal rate, regular rhythm, normal heart sounds and intact distal pulses.  Exam reveals no gallop and no friction rub.   No murmur heard. Pulmonary/Chest:  Effort normal and breath sounds normal. No respiratory distress. She has no wheezes. She has no rales. She exhibits no tenderness.  Abdominal: Soft. Bowel sounds are normal. She exhibits no distension and no mass. There is tenderness in the suprapubic area. There is no rigidity, no rebound, no guarding, no CVA tenderness, no tenderness at McBurney's point and negative Murphy's sign.  Musculoskeletal: Normal range of motion.  Lymphadenopathy:    She has no cervical adenopathy.  Neurological: She is alert and oriented to person, place, and time. No cranial nerve deficit. Coordination normal.  Skin: Skin is warm and dry. She is not diaphoretic.  Psychiatric: She has a normal mood and affect. Her behavior is normal. Judgment and thought content normal.  Nursing note and vitals reviewed.   Filed Vitals:   04/04/16 1451  BP: 118/70  Pulse: 82  Temp: 98.2 F (36.8 C)  Resp: 18          Assessment & Plan:    1. Suprapubic abdominal pain, unspecified laterality -cipro -hydrocodone -possible UTI.  Ddx includes diverticulitis, UTI, nephrolithiasis, and musculoskeletal pain - Urinalysis, Routine w reflex microscopic (not at [facility]) - Culture, Urine - CBC with Differential/Platelet - BASIC METABOLIC PANEL WITH GFR - Hepatic function panel

## 2016-04-05 LAB — URINALYSIS, ROUTINE W REFLEX MICROSCOPIC
BILIRUBIN URINE: NEGATIVE
Glucose, UA: NEGATIVE
HGB URINE DIPSTICK: NEGATIVE
KETONES UR: NEGATIVE
LEUKOCYTES UA: NEGATIVE
NITRITE: NEGATIVE
PH: 7.5 (ref 5.0–8.0)
PROTEIN: NEGATIVE
SPECIFIC GRAVITY, URINE: 1.01 (ref 1.001–1.035)

## 2016-04-05 LAB — CBC WITH DIFFERENTIAL/PLATELET
BASOS PCT: 1 %
Basophils Absolute: 45 cells/uL (ref 0–200)
Eosinophils Absolute: 90 cells/uL (ref 15–500)
Eosinophils Relative: 2 %
HEMATOCRIT: 36.9 % (ref 35.0–45.0)
Hemoglobin: 12.2 g/dL (ref 11.7–15.5)
LYMPHS PCT: 40 %
Lymphs Abs: 1800 cells/uL (ref 850–3900)
MCH: 29.8 pg (ref 27.0–33.0)
MCHC: 33.1 g/dL (ref 32.0–36.0)
MCV: 90 fL (ref 80.0–100.0)
MONOS PCT: 7 %
MPV: 8.2 fL (ref 7.5–12.5)
Monocytes Absolute: 315 cells/uL (ref 200–950)
NEUTROS PCT: 50 %
Neutro Abs: 2250 cells/uL (ref 1500–7800)
PLATELETS: 321 10*3/uL (ref 140–400)
RBC: 4.1 MIL/uL (ref 3.80–5.10)
RDW: 13.8 % (ref 11.0–15.0)
WBC: 4.5 10*3/uL (ref 3.8–10.8)

## 2016-04-05 LAB — BASIC METABOLIC PANEL WITH GFR
BUN: 13 mg/dL (ref 7–25)
CALCIUM: 9.2 mg/dL (ref 8.6–10.4)
CO2: 25 mmol/L (ref 20–31)
Chloride: 105 mmol/L (ref 98–110)
Creat: 0.61 mg/dL (ref 0.50–0.99)
GLUCOSE: 78 mg/dL (ref 65–99)
POTASSIUM: 3.9 mmol/L (ref 3.5–5.3)
Sodium: 141 mmol/L (ref 135–146)

## 2016-04-05 LAB — HEPATIC FUNCTION PANEL
ALBUMIN: 4.4 g/dL (ref 3.6–5.1)
ALK PHOS: 44 U/L (ref 33–130)
ALT: 12 U/L (ref 6–29)
AST: 17 U/L (ref 10–35)
BILIRUBIN TOTAL: 0.4 mg/dL (ref 0.2–1.2)
Bilirubin, Direct: 0.1 mg/dL (ref <=0.2)
Indirect Bilirubin: 0.3 mg/dL (ref 0.2–1.2)
TOTAL PROTEIN: 6.6 g/dL (ref 6.1–8.1)

## 2016-04-06 ENCOUNTER — Ambulatory Visit: Payer: Self-pay | Admitting: Physician Assistant

## 2016-04-06 LAB — URINE CULTURE: Organism ID, Bacteria: 10000

## 2016-05-30 ENCOUNTER — Encounter: Payer: Self-pay | Admitting: Physician Assistant

## 2016-05-30 ENCOUNTER — Ambulatory Visit (HOSPITAL_COMMUNITY)
Admission: RE | Admit: 2016-05-30 | Discharge: 2016-05-30 | Disposition: A | Discharge status: Home / Self Care | Payer: Medicare Other | Source: Ambulatory Visit | Attending: Physician Assistant | Admitting: Physician Assistant

## 2016-05-30 ENCOUNTER — Ambulatory Visit (INDEPENDENT_AMBULATORY_CARE_PROVIDER_SITE_OTHER): Payer: Medicare Other | Admitting: Physician Assistant

## 2016-05-30 ENCOUNTER — Ambulatory Visit: Payer: Self-pay | Admitting: Physician Assistant

## 2016-05-30 VITALS — BP 110/66 | HR 69 | Temp 98.6°F | Resp 14 | Ht 62.0 in | Wt 124.8 lb

## 2016-05-30 DIAGNOSIS — M549 Dorsalgia, unspecified: Secondary | ICD-10-CM

## 2016-05-30 DIAGNOSIS — R39198 Other difficulties with micturition: Secondary | ICD-10-CM | POA: Diagnosis not present

## 2016-05-30 DIAGNOSIS — G8929 Other chronic pain: Secondary | ICD-10-CM

## 2016-05-30 DIAGNOSIS — R1031 Right lower quadrant pain: Secondary | ICD-10-CM | POA: Diagnosis present

## 2016-05-30 DIAGNOSIS — N200 Calculus of kidney: Secondary | ICD-10-CM | POA: Insufficient documentation

## 2016-05-30 DIAGNOSIS — E039 Hypothyroidism, unspecified: Secondary | ICD-10-CM

## 2016-05-30 DIAGNOSIS — Z23 Encounter for immunization: Secondary | ICD-10-CM

## 2016-05-30 LAB — CBC WITH DIFFERENTIAL/PLATELET
BASOS ABS: 57 {cells}/uL (ref 0–200)
BASOS PCT: 1 %
EOS ABS: 114 {cells}/uL (ref 15–500)
Eosinophils Relative: 2 %
HCT: 38.3 % (ref 35.0–45.0)
Hemoglobin: 13.3 g/dL (ref 11.7–15.5)
LYMPHS PCT: 26 %
Lymphs Abs: 1482 cells/uL (ref 850–3900)
MCH: 31.2 pg (ref 27.0–33.0)
MCHC: 34.7 g/dL (ref 32.0–36.0)
MCV: 89.9 fL (ref 80.0–100.0)
MONOS PCT: 8 %
MPV: 8 fL (ref 7.5–12.5)
Monocytes Absolute: 456 cells/uL (ref 200–950)
Neutro Abs: 3591 cells/uL (ref 1500–7800)
Neutrophils Relative %: 63 %
Platelets: 354 10*3/uL (ref 140–400)
RBC: 4.26 MIL/uL (ref 3.80–5.10)
RDW: 13.5 % (ref 11.0–15.0)
WBC: 5.7 10*3/uL (ref 3.8–10.8)

## 2016-05-30 LAB — BASIC METABOLIC PANEL WITH GFR
BUN: 11 mg/dL (ref 7–25)
CALCIUM: 9.8 mg/dL (ref 8.6–10.4)
CHLORIDE: 106 mmol/L (ref 98–110)
CO2: 28 mmol/L (ref 20–31)
CREATININE: 0.76 mg/dL (ref 0.50–0.99)
GFR, Est African American: >89 mL/min (ref >=60)
GFR, Est Non African American: 83 mL/min (ref >=60)
Glucose, Bld: 91 mg/dL (ref 65–99)
Potassium: 4.3 mmol/L (ref 3.5–5.3)
Sodium: 142 mmol/L (ref 135–146)

## 2016-05-30 LAB — HEPATIC FUNCTION PANEL
ALBUMIN: 4.5 g/dL (ref 3.6–5.1)
ALT: 11 U/L (ref 6–29)
AST: 17 U/L (ref 10–35)
Alkaline Phosphatase: 45 U/L (ref 33–130)
BILIRUBIN TOTAL: 0.4 mg/dL (ref 0.2–1.2)
Bilirubin, Direct: 0.1 mg/dL (ref <=0.2)
Indirect Bilirubin: 0.3 mg/dL (ref 0.2–1.2)
Total Protein: 6.8 g/dL (ref 6.1–8.1)

## 2016-05-30 MED ORDER — SULFAMETHOXAZOLE-TRIMETHOPRIM 800-160 MG PO TABS: 1.0000 | ORAL_TABLET | Freq: Two times a day (BID) | ORAL | 0 refills | Status: DC

## 2016-05-30 NOTE — Progress Notes (Signed)
Subjective:    Patient ID: Monique Thomas, female    DOB: 03-26-1951, 65 y.o.   MRN: [id number]  HPI 65 y.o. WF with history of recurrent UTI/kidney stones (had lithotripsy in Jan 2016 with Dr. Gaynelle Arabian) presents with possible UTI x 3 days. She has been having back pain, seeing ortho but has gotten worse x 2 weeks. For past 3 days when she goes to urinate she has hesitancy, decreased stream, some right flank pain/back pain, denies frequency, urgency, hematuria, dysuria, fever, chills. She has had AB surgeries in the past and has history of adhesions, denies N,V, constipation/diarrhea/obstructive symptoms.   Blood pressure 110/66, pulse 69, temperature 98.6 F (37 C), resp. rate 14, height 5\' 2"  (1.575 m), weight 124 lb 12.8 oz (56.6 kg), SpO2 98 %.  Medications Current Outpatient Prescriptions on File Prior to Visit  Medication Sig  . ALPRAZolam (XANAX) 1 MG tablet TAKE 1/2-1 TABLET BY MOUTH THREE TIMES DAILY AS NEEDED FOR ANXIETY OR SLEEP  . cholecalciferol (VITAMIN D) 1000 UNITS tablet Take 1,000 Units by mouth 2 (two) times daily.   . Cyanocobalamin (B-12 PO) Take 1 tablet by mouth daily.  Marland Kitchen estradiol (ESTRACE) 0.5 MG tablet Take 0.5 mg by mouth daily.  Marland Kitchen HYDROcodone-acetaminophen (NORCO) 5-325 MG tablet Take 1 tablet by mouth every 6 (six) hours as needed for moderate pain.  Marland Kitchen levothyroxine (SYNTHROID, LEVOTHROID) 88 MCG tablet Take 88 mcg by mouth daily before breakfast. Brand name only 1 QD  . lidocaine (LIDODERM) 5 % APP 1 PATCH AA OF SKIN FOR 12 H THEN TK THE PATCH OFF FOR 12 H  . Magnesium 500 MG CAPS Take 2 capsules by mouth daily.   No current facility-administered medications on file prior to visit.     Problem list She has Back pain, chronic; Hyperlipidemia; Hypothyroidism; GERD (gastroesophageal reflux disease); Anemia; Anxiety; Vitamin D deficiency; Medication management; Prediabetes; Chronic pain syndrome; and S/P lumbar spinal fusion on her problem list.  Review of  Systems  Constitutional: Negative.   HENT: Negative.   Respiratory: Negative.   Cardiovascular: Negative.   Gastrointestinal: Negative.   Genitourinary: Positive for decreased urine volume, difficulty urinating and flank pain. Negative for dysuria, enuresis, frequency, genital sores, hematuria, menstrual problem, pelvic pain, urgency, vaginal bleeding, vaginal discharge and vaginal pain.  Musculoskeletal: Positive for back pain. Negative for arthralgias and gait problem.  Skin: Negative.  Negative for rash.  Neurological: Negative.   Psychiatric/Behavioral: Negative.        Objective:   Physical Exam  Constitutional: She is oriented to person, place, and time. She appears well-developed and well-nourished.  HENT:  Nose: Right sinus exhibits no maxillary sinus tenderness. Left sinus exhibits no maxillary sinus tenderness.  Mouth/Throat: Mucous membranes are normal. No trismus in the jaw. No dental abscesses. No oropharyngeal exudate, posterior oropharyngeal edema, posterior oropharyngeal erythema or tonsillar abscesses.  Cardiovascular: Normal rate and regular rhythm.   Pulmonary/Chest: Effort normal and breath sounds normal.  Abdominal: Soft. Bowel sounds are normal. There is tenderness (RLQ). There is CVA tenderness (right). There is no rebound and no tenderness at McBurney's point.  Musculoskeletal:  Patient is able to ambulate well.   Gait is not antalgic. Straight leg raising with dorsiflexion negative bilaterally for radicular symptoms. Sensory exam in the legs are normal.  Knee reflexes are normal Ankle reflexes are normal Strength is normal and symmetric in arms and legs. There isparaspinal muscle spasm. + bilateral SI tenderness   There is not midline tenderness.  ROM of spine with normal flexion, extension, lateral range of motion to the right and left, and rotation to the right and left.    Neurological: She is alert and oriented to person, place, and time. She has normal  reflexes.  Skin: Skin is warm and dry. No rash noted.      Assessment & Plan:  Decreased urinary output/right AB pain, normal reflexes, with history of kidney stones No red flag symptoms, no rebound tenderness, will check AB Xray for stones/gas patterns, follow up Dr. Gaynelle Arabian, has hydrocodone at home, bactrim sent in, may need CT AB  All questions were answered. The patient knows to call the clinic with any problems, questions or concerns or go to the ER if any further progression of symptoms.

## 2016-05-30 NOTE — Patient Instructions (Signed)
Flank Pain °Flank pain refers to pain that is located on the side of the body between the upper abdomen and the back. The pain may occur over a short period of time (acute) or may be long-term or reoccurring (chronic). It may be mild or severe. Flank pain can be caused by many things. °CAUSES  °Some of the more common causes of flank pain include: °· Muscle strains.   °· Muscle spasms.   °· A disease of your spine (vertebral disk disease).   °· A lung infection (pneumonia).   °· Fluid around your lungs (pulmonary edema).   °· A kidney infection.   °· Kidney stones.   °· A very painful skin rash caused by the chickenpox virus (shingles).   °· Gallbladder disease.   °HOME CARE INSTRUCTIONS  °Home care will depend on the cause of your pain. In general, °· Rest as directed by your caregiver. °· Drink enough fluids to keep your urine clear or pale yellow. °· Only take over-the-counter or prescription medicines as directed by your caregiver. Some medicines may help relieve the pain. °· Tell your caregiver about any changes in your pain. °· Follow up with your caregiver as directed. °SEEK IMMEDIATE MEDICAL CARE IF:  °· Your pain is not controlled with medicine.   °· You have new or worsening symptoms. °· Your pain increases.   °· You have abdominal pain.   °· You have shortness of breath.   °· You have persistent nausea or vomiting.   °· You have swelling in your abdomen.   °· You feel faint or pass out.   °· You have blood in your urine. °· You have a fever or persistent symptoms for more than 2-3 days. °· You have a fever and your symptoms suddenly get worse. °MAKE SURE YOU:  °· Understand these instructions. °· Will watch your condition. °· Will get help right away if you are not doing well or get worse. °  °This information is not intended to replace advice given to you by your health care provider. Make sure you discuss any questions you have with your health care provider. °  °Document Released: 10/27/2005 Document  Revised: 05/30/2012 Document Reviewed: 04/19/2012 °Elsevier Interactive Patient Education ©2016 [employer]vier Inc[employer] ° °

## 2016-05-31 ENCOUNTER — Telehealth: Payer: Self-pay

## 2016-05-31 LAB — URINALYSIS, ROUTINE W REFLEX MICROSCOPIC
BILIRUBIN URINE: NEGATIVE
GLUCOSE, UA: NEGATIVE
HGB URINE DIPSTICK: NEGATIVE
Ketones, ur: NEGATIVE
Leukocytes, UA: NEGATIVE
Nitrite: NEGATIVE
PROTEIN: NEGATIVE
Specific Gravity, Urine: 1.001 (ref 1.001–1.035)
pH: 7.5 (ref 5.0–8.0)

## 2016-05-31 LAB — TSH: TSH: 3.6 m[IU]/L

## 2016-05-31 NOTE — Telephone Encounter (Signed)
LVM FOR PT TO RETURN OFFICE CALL. PT HAD QUESTIONS ABOUT LAST VISIT PER HER MESSAGE.

## 2016-06-01 LAB — URINE CULTURE: Colony Count: >=100000

## 2016-06-04 ENCOUNTER — Other Ambulatory Visit: Payer: Self-pay | Admitting: Internal Medicine

## 2016-06-04 MED ORDER — CIPROFLOXACIN HCL 500 MG PO TABS
ORAL_TABLET | ORAL | 0 refills | Status: AC
Start: 2016-06-04 — End: 2016-06-10

## 2016-06-21 ENCOUNTER — Ambulatory Visit (INDEPENDENT_AMBULATORY_CARE_PROVIDER_SITE_OTHER): Payer: Medicare Other | Admitting: Physician Assistant

## 2016-06-21 ENCOUNTER — Encounter: Payer: Self-pay | Admitting: Physician Assistant

## 2016-06-21 VITALS — BP 140/90 | HR 105 | Temp 97.5°F | Resp 16 | Wt 116.8 lb

## 2016-06-21 DIAGNOSIS — R251 Tremor, unspecified: Secondary | ICD-10-CM

## 2016-06-21 MED ORDER — AMITRIPTYLINE HCL 10 MG PO TABS
ORAL_TABLET | ORAL | 0 refills | Status: DC
Start: 2016-06-21 — End: 2016-08-15

## 2016-06-21 NOTE — Progress Notes (Signed)
Subjective:    Patient ID: Monique Thomas, female    DOB: 1951-03-17, 65 y.o.   MRN: [id number]  HPI 65 y.o. WF presents with tremor and still having back/AB pain.  She states with hold her cell phone she will have tremor/shaking x 2 months, not resting tremor, worse with intention/holding objects, bilateral hands. Has been under a lot of stress. No family history of tremor, no family history of parkinson's. No pain, numbness, tingling, in hands.   Specific Symptoms:  Tremor: Yes Voice: No Sleep: no Vivid Dreams: No. Acting out dreams: No Wet Pillows: No Postural symptoms: No Falls? No. Bradykinesia symptoms: shuffling gait, slow movements and difficulty getting out of a chair; some drooling during the day that has become very bothersome, as he works 3 hours per day and is embarrassing Loss of smell: No.  Loss of taste: No. Urinary Incontinence: No Difficulty Swallowing: No. Handwriting, micrographia: No.  (R handed) Trouble with ADL's: No Trouble buttoning clothing: No  Depression: Yes Memory changes: No, nothing new Hallucinations: No. visual distortions: No. Diplopia: No. Dyskinesia: No.  Neuroimaging has not previously been performed.    Blood pressure 140/90, pulse (!) 105, temperature 97.5 F (36.4 C), resp. rate 16, weight 116 lb 12.8 oz (53 kg), SpO2 97 %.  She is on thyroid medication. Her medication was not changed last visit.   Lab Results  Component Value Date   TSH 3.60 05/30/2016  .   Medications Current Outpatient Prescriptions on File Prior to Visit  Medication Sig  . ALPRAZolam (XANAX) 1 MG tablet TAKE 1/2-1 TABLET BY MOUTH THREE TIMES DAILY AS NEEDED FOR ANXIETY OR SLEEP  . cholecalciferol (VITAMIN D) 1000 UNITS tablet Take 1,000 Units by mouth 2 (two) times daily.   . Cyanocobalamin (B-12 PO) Take 1 tablet by mouth daily.  Marland Kitchen estradiol (ESTRACE) 0.5 MG tablet Take 0.5  mg by mouth daily.  Marland Kitchen HYDROcodone-acetaminophen (NORCO) 5-325 MG tablet Take 1 tablet by mouth every 6 (six) hours as needed for moderate pain.  Marland Kitchen levothyroxine (SYNTHROID, LEVOTHROID) 88 MCG tablet Take 88 mcg by mouth daily before breakfast. Brand name only 1 QD  . lidocaine (LIDODERM) 5 % APP 1 PATCH AA OF SKIN FOR 12 H THEN TK THE PATCH OFF FOR 12 H  . Magnesium 500 MG CAPS Take 2 capsules by mouth daily.   No current facility-administered medications on file prior to visit.     Problem list She has Back pain, chronic; Hyperlipidemia; Hypothyroidism; GERD (gastroesophageal reflux disease); Anemia; Anxiety; Vitamin D deficiency; Medication management; Prediabetes; Chronic pain syndrome; and S/P lumbar spinal fusion on her problem list.  Review of Systems  Constitutional: Negative.   HENT: Negative.  Negative for trouble swallowing and voice change.   Eyes: Negative for visual disturbance.  Respiratory: Negative.   Cardiovascular: Negative.   Gastrointestinal: Negative.   Genitourinary: Positive for decreased urine volume. Negative for difficulty urinating, dysuria, enuresis, flank pain, frequency, genital sores, hematuria, menstrual problem, pelvic pain, urgency, vaginal bleeding, vaginal discharge and vaginal pain.  Musculoskeletal: Positive for back pain. Negative for arthralgias, gait problem and neck pain.  Skin: Negative.  Negative for rash.  Neurological: Positive for tremors. Negative for dizziness, seizures, syncope, facial asymmetry, speech difficulty, weakness, light-headedness, numbness and headaches.  Psychiatric/Behavioral: Negative for confusion, decreased concentration, dysphoric mood, hallucinations, self-injury and suicidal ideas. The patient is nervous/anxious.        Objective:   Physical Exam  Constitutional: She is oriented to person, place, and  time. She appears well-developed and well-nourished.  HENT:  Nose: Right sinus exhibits no maxillary sinus tenderness.  Left sinus exhibits no maxillary sinus tenderness.  Mouth/Throat: Mucous membranes are normal. No trismus in the jaw. No dental abscesses. No oropharyngeal exudate, posterior oropharyngeal edema, posterior oropharyngeal erythema or tonsillar abscesses.  Cardiovascular: Normal rate and regular rhythm.   Pulmonary/Chest: Effort normal and breath sounds normal.  Abdominal: Soft. Bowel sounds are normal. There is tenderness (RLQ). There is CVA tenderness (right). There is no rebound and no tenderness at McBurney's point.  Musculoskeletal:  Straight leg raising with dorsiflexion negative bilaterally for radicular symptoms.Sensory exam in the legs are normal. Reflexes and strength are normal and symmetric.   Neurological: She is alert and oriented to person, place, and time. She has normal reflexes.  No cranial defects, normal finger to nose, no bradykinesia with standing, normal gait, mildly antalgic. Questionable minor cog wheeling bilateral arms, and fine tremor with intention bilateral hands.  Skin: Skin is warm and dry. No rash noted.  Psychiatric: Her mood appears anxious.       Assessment & Plan:  Tremor, appears benign Has some questionable cogwheeling but otherwise normal neuro exam, recent normal labs, patient declines at this time increase water, ? From recent prednisone/anxiety with daughter having surgery, if not better or any worsening symptoms will add BB and send to neuro to rule out parkinson's  Right flank pain/ICS Try the amitriptyline 10mg  TID prn  Has close follow up Future Appointments Date Time Provider [place]  06/30/2016 8:45 AM Starlyn Skeans, PA-C [facility] None  10/10/2016 10:00 AM Vicie Mutters, PA-C [facility] None

## 2016-06-21 NOTE — Patient Instructions (Signed)
Essential Tremor A tremor is trembling or shaking that you cannot control. Most tremors affect the hands or arms. Tremors can also affect the head, vocal cords, face, and other parts of the body.  Essential tremor is a tremor without a known cause.  CAUSES Essential tremor has no known cause.  RISK FACTORS You may be at greater risk of essential tremor if:   You have a family member with essential tremor.   You are age 65 or older.   You take certain medicines. SIGNS AND SYMPTOMS The main sign of a tremor is uncontrolled and unintentional rhythmic shaking of a body part.  You may have difficulty eating with a spoon or fork.   You may have difficulty writing.   You may nod your head up and down or side to side.   You may have a quivering voice.  Your tremors:  May get worse over time.   May come and go.   May be more noticeable on one side of your body.   May get worse due to stress, fatigue, caffeine, and extreme heat or cold.  DIAGNOSIS Your health care provider can diagnose essential tremor based on your symptoms, medical history, and a physical examination. There is no single test to diagnose an essential tremor. However, your health care provider may perform a variety of tests to rule out other conditions. Tests may include:   Blood and urine tests.   Imaging studies of your brain, such as:   CT scan.   MRI.   A test that measures involuntary muscle movement (electromyogram). TREATMENT Your tremors may go away without treatment. Mild tremors may not need treatment if they do not affect your day-to-day life. Severe tremors may need to be treated using one or a combination of the following options:   Medicines. This may include medicine that is injected.  Lifestyle changes.   Physical therapy.  HOME CARE INSTRUCTIONS  Take medicines only as directed by your health care provider.   Limit alcohol intake to no more than 1 drink per day for  nonpregnant women and 2 drinks per day for men. One drink equals 12 oz of beer, 5 oz of wine, or 1 oz of hard liquor.  Do not use any tobacco products, including cigarettes, chewing tobacco, or electronic cigarettes. If you need help quitting, ask your health care provider.  Take medicines only as directed by your health care provider.   Avoid extreme heat or cold.   Limit the amount of caffeine you consumeas directed by your health care provider.   Try to get eight hours of sleep each night.  Find ways to manage your stress, such as meditation or yoga.  Keep all follow-up visits as directed by your health care provider. This is important. This includes any physical therapy visits. SEEK MEDICAL CARE IF:  You experience any changes in the location or intensity of your tremors.   You start having a tremor after starting a new medicine.   You have tremor with other symptoms such as:   Numbness.   Tingling.   Pain.   Weakness.   Your tremor gets worse.   Your tremor interferes with your daily life.    This information is not intended to replace advice given to you by your health care provider. Make sure you discuss any questions you have with your health care provider.   Document Released: 09/26/2014 Document Reviewed: 09/26/2014 Elsevier Interactive Patient Education 2016 Elsevier Inc.  

## 2016-06-29 ENCOUNTER — Other Ambulatory Visit: Payer: Self-pay

## 2016-06-30 ENCOUNTER — Ambulatory Visit (INDEPENDENT_AMBULATORY_CARE_PROVIDER_SITE_OTHER): Payer: Medicare Other | Admitting: Internal Medicine

## 2016-06-30 ENCOUNTER — Encounter: Payer: Self-pay | Admitting: Internal Medicine

## 2016-06-30 VITALS — BP 126/62 | HR 76 | Temp 98.0°F | Resp 16 | Ht 61.0 in | Wt 118.0 lb

## 2016-06-30 DIAGNOSIS — R7303 Prediabetes: Secondary | ICD-10-CM

## 2016-06-30 DIAGNOSIS — E039 Hypothyroidism, unspecified: Secondary | ICD-10-CM

## 2016-06-30 DIAGNOSIS — E782 Mixed hyperlipidemia: Secondary | ICD-10-CM | POA: Diagnosis not present

## 2016-06-30 DIAGNOSIS — Z79899 Other long term (current) drug therapy: Secondary | ICD-10-CM

## 2016-06-30 DIAGNOSIS — E559 Vitamin D deficiency, unspecified: Secondary | ICD-10-CM

## 2016-06-30 DIAGNOSIS — K5909 Other constipation: Secondary | ICD-10-CM

## 2016-06-30 DIAGNOSIS — F419 Anxiety disorder, unspecified: Secondary | ICD-10-CM

## 2016-06-30 DIAGNOSIS — Z1211 Encounter for screening for malignant neoplasm of colon: Secondary | ICD-10-CM

## 2016-06-30 LAB — CBC WITH DIFFERENTIAL/PLATELET
BASOS PCT: 0 %
Basophils Absolute: 0 cells/uL (ref 0–200)
EOS PCT: 1 %
Eosinophils Absolute: 58 cells/uL (ref 15–500)
HCT: 40 % (ref 35.0–45.0)
HEMOGLOBIN: 13.6 g/dL (ref 11.7–15.5)
LYMPHS ABS: 1798 {cells}/uL (ref 850–3900)
Lymphocytes Relative: 31 %
MCH: 30.9 pg (ref 27.0–33.0)
MCHC: 34 g/dL (ref 32.0–36.0)
MCV: 90.9 fL (ref 80.0–100.0)
MONOS PCT: 10 %
MPV: 8.1 fL (ref 7.5–12.5)
Monocytes Absolute: 580 cells/uL (ref 200–950)
NEUTROS ABS: 3364 {cells}/uL (ref 1500–7800)
Neutrophils Relative %: 58 %
PLATELETS: 376 10*3/uL (ref 140–400)
RBC: 4.4 MIL/uL (ref 3.80–5.10)
RDW: 13.6 % (ref 11.0–15.0)
WBC: 5.8 10*3/uL (ref 3.8–10.8)

## 2016-06-30 LAB — LIPID PANEL
CHOL/HDL RATIO: 2.1 ratio (ref <=5.0)
Cholesterol: 191 mg/dL (ref 125–200)
HDL: 89 mg/dL (ref >=46)
LDL CALC: 84 mg/dL (ref <130)
TRIGLYCERIDES: 92 mg/dL (ref <150)
VLDL: 18 mg/dL (ref <30)

## 2016-06-30 LAB — BASIC METABOLIC PANEL WITH GFR
BUN: 10 mg/dL (ref 7–25)
CHLORIDE: 103 mmol/L (ref 98–110)
CO2: 25 mmol/L (ref 20–31)
CREATININE: 0.61 mg/dL (ref 0.50–0.99)
Calcium: 9.2 mg/dL (ref 8.6–10.4)
GFR, Est African American: >89 mL/min (ref >=60)
Glucose, Bld: 85 mg/dL (ref 65–99)
POTASSIUM: 4.1 mmol/L (ref 3.5–5.3)
SODIUM: 139 mmol/L (ref 135–146)

## 2016-06-30 LAB — HEPATIC FUNCTION PANEL
ALT: 10 U/L (ref 6–29)
AST: 16 U/L (ref 10–35)
Albumin: 4.3 g/dL (ref 3.6–5.1)
Alkaline Phosphatase: 49 U/L (ref 33–130)
BILIRUBIN DIRECT: 0.1 mg/dL (ref <=0.2)
BILIRUBIN INDIRECT: 0.3 mg/dL (ref 0.2–1.2)
BILIRUBIN TOTAL: 0.4 mg/dL (ref 0.2–1.2)
Total Protein: 6.6 g/dL (ref 6.1–8.1)

## 2016-06-30 LAB — TSH: TSH: 5.12 m[IU]/L — AB

## 2016-06-30 MED ORDER — ALPRAZOLAM 1 MG PO TABS: ORAL_TABLET | ORAL | 1 refills | Status: DC

## 2016-06-30 NOTE — Progress Notes (Signed)
Assessment and Plan:  Hypertension:  -Continue medication,  -monitor blood pressure at home.  -Continue DASH diet.   -Reminder to go to the ER if any CP, SOB, nausea, dizziness, severe HA, changes vision/speech, left arm numbness and tingling, and jaw pain.  Cholesterol: -Continue diet and exercise.  -Check cholesterol.   Pre-diabetes: -Continue diet and exercise.  -Check A1C  Vitamin D Def: -check level -continue medications.   Chronic constipation -start linzess 145 mcg samples once daily with breakfast -needs to schedule colonoscopy -referral placed  Hypothyroidism -TSH -cont levothyroxine  Continue diet and meds as discussed. Further disposition pending results of labs.  HPI 65 y.o. female  presents for 3 month follow up with hypertension, hyperlipidemia, prediabetes and vitamin D.   Her blood pressure has been controlled at home, today their BP is BP: 126/62.   She does not workout. She denies chest pain, shortness of breath, dizziness.   She is on cholesterol medication and denies myalgias. Her cholesterol is at goal. The cholesterol last visit was:   Lab Results  Component Value Date   CHOL 163 02/26/2016   HDL 89 02/26/2016   LDLCALC 55 02/26/2016   TRIG 96 02/26/2016   CHOLHDL 1.8 02/26/2016     She has been working on diet and exercise for prediabetes, and denies foot ulcerations, hyperglycemia, hypoglycemia , increased appetite, nausea, paresthesia of the feet, polydipsia, polyuria, visual disturbances, vomiting and weight loss. Last A1C in the office was:  Lab Results  Component Value Date   HGBA1C 5.5 02/26/2016    Patient is on Vitamin D supplement.  Lab Results  Component Value Date   VD25OH 41 10/07/2015      She reports that she does have trouble with her back and also her RLQ.  She rpeorts that she is chronically constipated with some relief from OTC meds.  She reports that she feels a little bit better than last visit but still is  uncomfortable.   She is taking her thyroid medication every morning.  She does wait prior to eating.    She reports that she does have some hesitancy with urinating.  NO pain, no frequency or urgency.   Current Medications:  Current Outpatient Prescriptions on File Prior to Visit  Medication Sig Dispense Refill  . ALPRAZolam (XANAX) 1 MG tablet TAKE 1/2-1 TABLET BY MOUTH THREE TIMES DAILY AS NEEDED FOR ANXIETY OR SLEEP 90 tablet 1  . amitriptyline (ELAVIL) 10 MG tablet Can take 1 pill up to 3 x a day for nerves/pain, start one pill at night 30 tablet 0  . cholecalciferol (VITAMIN D) 1000 UNITS tablet Take 1,000 Units by mouth 2 (two) times daily.     . Cyanocobalamin (B-12 PO) Take 1 tablet by mouth daily.    Marland Kitchen estradiol (ESTRACE) 0.5 MG tablet Take 0.5 mg by mouth daily.    Marland Kitchen HYDROcodone-acetaminophen (NORCO) 5-325 MG tablet Take 1 tablet by mouth every 6 (six) hours as needed for moderate pain. 30 tablet 0  . levothyroxine (SYNTHROID, LEVOTHROID) 88 MCG tablet Take 88 mcg by mouth daily before breakfast. Brand name only 1 QD    . lidocaine (LIDODERM) 5 % APP 1 PATCH AA OF SKIN FOR 12 H THEN TK THE PATCH OFF FOR 12 H  2  . Magnesium 500 MG CAPS Take 2 capsules by mouth daily.     No current facility-administered medications on file prior to visit.     Medical History:  Past Medical History:  Diagnosis Date  .  Anemia   . Anxiety   . Arthritis   . Back pain, chronic   . Depression   . GERD (gastroesophageal reflux disease)   . Hyperlipidemia   . Hypothyroidism   . Kidney stone    2015  . Thyroid disease   . Umbilical hernia   . Unspecified vitamin D deficiency     Allergies:  Allergies  Allergen Reactions  . Celebrex [Celecoxib]     "panic attack"  . Other     Skin bruises very easily and peels back  . Topamax [Topiramate]     MEMORY  . Entex Lq [Phenylephrine-Guaifenesin] Rash  . Fiorinal [Butalbital-Aspirin-Caffeine] Rash     Review of Systems:  Review of  Systems  Constitutional: Negative for chills, fever and malaise/fatigue.  HENT: Negative for congestion, ear pain and sore throat.   Eyes: Negative.   Respiratory: Negative for cough, shortness of breath and wheezing.   Cardiovascular: Negative for chest pain, palpitations and leg swelling.  Gastrointestinal: Positive for constipation. Negative for abdominal pain, blood in stool, diarrhea, heartburn and melena.  Genitourinary: Negative.   Skin: Negative.   Neurological: Negative for dizziness, sensory change, loss of consciousness and headaches.  Psychiatric/Behavioral: Negative for depression. The patient is not nervous/anxious and does not have insomnia.     Family history- Review and unchanged  Social history- Review and unchanged  Physical Exam: BP 126/62   Pulse 76   Temp 98 F (36.7 C) (Temporal)   Resp 16   Ht 5\' 1"  (1.549 m)   Wt 118 lb (53.5 kg)   BMI 22.30 kg/m  Wt Readings from Last 3 Encounters:  06/30/16 118 lb (53.5 kg)  06/21/16 116 lb 12.8 oz (53 kg)  05/30/16 124 lb 12.8 oz (56.6 kg)    General Appearance: Well nourished well developed, in no apparent distress. Eyes: PERRLA, EOMs, conjunctiva no swelling or erythema ENT/Mouth: Ear canals normal without obstruction, swelling, erythma, discharge.  TMs normal bilaterally.  Oropharynx moist, clear, without exudate, or postoropharyngeal swelling. Neck: Supple, thyroid normal,no cervical adenopathy  Respiratory: Respiratory effort normal, Breath sounds clear A&P without rhonchi, wheeze, or rale.  No retractions, no accessory usage. Cardio: RRR with no MRGs. Brisk peripheral pulses without edema.  Abdomen: Soft, + BS,  Non tender, no guarding, rebound, hernias, masses. Musculoskeletal: Full ROM, 5/5 strength, Normal gait Skin: Warm, dry without rashes, lesions, ecchymosis.  Neuro: Awake and oriented X 3, Cranial nerves intact. Normal muscle tone, no cerebellar symptoms. Psych: Normal affect, Insight and Judgment  appropriate.    Starlyn Skeans, PA-C 9:03 AM [facility]

## 2016-07-01 LAB — HEMOGLOBIN A1C
Hgb A1c MFr Bld: 5.3 % (ref <5.7)
MEAN PLASMA GLUCOSE: 105 mg/dL

## 2016-08-15 ENCOUNTER — Ambulatory Visit (INDEPENDENT_AMBULATORY_CARE_PROVIDER_SITE_OTHER): Payer: Medicare Other | Admitting: Internal Medicine

## 2016-08-15 ENCOUNTER — Encounter: Payer: Self-pay | Admitting: Internal Medicine

## 2016-08-15 VITALS — BP 118/62 | HR 82 | Temp 98.0°F | Ht 61.0 in

## 2016-08-15 DIAGNOSIS — R3915 Urgency of urination: Secondary | ICD-10-CM | POA: Diagnosis not present

## 2016-08-15 DIAGNOSIS — N8111 Cystocele, midline: Secondary | ICD-10-CM

## 2016-08-15 NOTE — Progress Notes (Signed)
Assessment and Plan:   1. Cystocele, midline -on pelvic exam significant pressure on finger during bimanual exam with cough and also with valsalva -discussed options of pessary, surgery vs. Pelvic floor PT -has appointment with Dr. Stann Mainland next week - Urinalysis, Routine w reflex microscopic (not at [facility]) - Culture, Urine  2. Urinary urgency -history not consistent with UTI but will screen for one - Urinalysis, Routine w reflex microscopic (not at [facility]) - Culture, Urine   HPI 65 y.o.female presents for urinary urgency which has been going on since September.  She also has some dysuria.  She notes that she has trouble starting her stream.  She reports that she has some difficulty starting the stream.  She reports that she feels like she cannot empty the bladder.  She reports that she is currently urinating 3 times per day.  She is drinking 6-7 large bottles of water and is also drinking several glasses of tea. She reports that she has seen urology on 06/01/16.  She also reports that she has an appointment with her gynecologist.  She has a history of a cystocele and this feels very similar.  She reports that the urologist in the past and was given myrbetriq.  She reports that she had some facial flushing and facial swelling. She reports that she cancelled her follow-up with urology.  She has a history of a bladder sling.  This was done by Dr. Gaynelle Arabian and this was roughly 2.5 years ago.  She did not have a pelvic exam when she went to the urologist. She does not have abdominal pain.  She reports that she does have some vaginal dryness.  She does not have any kind of vaginal bulging with coughing.  She does not have urinary incontinence.  She is not taking elavail.  She is taking claritin.  She is not taking any OTC cough meds.  She is not taking decongestants.    Past Medical History:  Diagnosis Date  . Anemia   . Anxiety   . Arthritis   . Back pain, chronic   . Depression   . GERD  (gastroesophageal reflux disease)   . Hyperlipidemia   . Hypothyroidism   . Kidney stone    2015  . Thyroid disease   . Umbilical hernia   . Unspecified vitamin D deficiency      Allergies  Allergen Reactions  . Celebrex [Celecoxib]     "panic attack"  . Other     Skin bruises very easily and peels back  . Topamax [Topiramate]     MEMORY  . Entex Lq [Phenylephrine-Guaifenesin] Rash  . Fiorinal [Butalbital-Aspirin-Caffeine] Rash      Current Outpatient Prescriptions on File Prior to Visit  Medication Sig Dispense Refill  . ALPRAZolam (XANAX) 1 MG tablet TAKE 1/2-1 TABLET BY MOUTH THREE TIMES DAILY AS NEEDED FOR ANXIETY OR SLEEP 90 tablet 1  . amitriptyline (ELAVIL) 10 MG tablet Can take 1 pill up to 3 x a day for nerves/pain, start one pill at night 30 tablet 0  . cholecalciferol (VITAMIN D) 1000 UNITS tablet Take 1,000 Units by mouth 2 (two) times daily.     . Cyanocobalamin (B-12 PO) Take 1 tablet by mouth daily.    Marland Kitchen estradiol (ESTRACE) 0.5 MG tablet Take 0.5 mg by mouth daily.    Marland Kitchen HYDROcodone-acetaminophen (NORCO) 5-325 MG tablet Take 1 tablet by mouth every 6 (six) hours as needed for moderate pain. 30 tablet 0  . levothyroxine (SYNTHROID, LEVOTHROID) 88 MCG  tablet Take 88 mcg by mouth daily before breakfast. Brand name only 1 QD    . lidocaine (LIDODERM) 5 % APP 1 PATCH AA OF SKIN FOR 12 H THEN TK THE PATCH OFF FOR 12 H  2  . Magnesium 500 MG CAPS Take 2 capsules by mouth daily.     No current facility-administered medications on file prior to visit.    Review of Systems  Constitutional: Negative for chills, fever and malaise/fatigue.  Gastrointestinal: Negative for abdominal pain.  Genitourinary: Positive for urgency. Negative for dysuria, flank pain, frequency and hematuria.       Weak stream, no vaginal discharge.  There is vaginal dryness.  No vaginal bleeding  Musculoskeletal: Negative for back pain.  Neurological: Negative for dizziness, sensory change and  headaches.  Psychiatric/Behavioral: Negative for depression. The patient is not nervous/anxious and does not have insomnia.      Physical Exam: There were no vitals filed for this visit. BP 118/62   Pulse 82   Temp 98 F (36.7 C) (Temporal)   Ht 5\' 1"  (1.549 m)  General Appearance: Well developed well nourished, non-toxic appearing in no apparent distress. Eyes: PERRLA, EOMs, conjunctiva w/ no swelling or erythema or discharge Sinuses: No Frontal/maxillary tenderness ENT/Mouth: Ear canals clear without swelling or erythema.  TM's normal bilaterally with no retractions, bulging, or loss of landmarks.   Neck: Supple, thyroid normal, no notable JVD  Respiratory: Respiratory effort normal, Clear breath sounds anteriorly and posteriorly bilaterally without rales, rhonchi, wheezing or stridor. No retractions or accessory muscle usage. Cardio: RRR with no MRGs.   Abdomen: Soft, + BS.  Non tender, no guarding, rebound, hernias, masses.  Musculoskeletal: Full ROM, 5/5 strength, normal gait.  Skin: Warm, dry without rashes  Neuro: Awake and oriented X 3, Cranial nerves intact. Normal muscle tone, no cerebellar symptoms. Sensation intact.  Psych: normal affect, Insight and Judgment appropriate.     Monique Skeans, PA-C 11:26 AM [facility]

## 2016-08-16 ENCOUNTER — Other Ambulatory Visit: Payer: Self-pay | Admitting: Internal Medicine

## 2016-08-16 LAB — URINALYSIS, ROUTINE W REFLEX MICROSCOPIC
Bilirubin Urine: NEGATIVE
Glucose, UA: NEGATIVE
HGB URINE DIPSTICK: NEGATIVE
KETONES UR: NEGATIVE
NITRITE: POSITIVE — AB
PH: 7 (ref 5.0–8.0)
Protein, ur: NEGATIVE
Specific Gravity, Urine: 1.008 (ref 1.001–1.035)

## 2016-08-16 LAB — URINALYSIS, MICROSCOPIC ONLY
CASTS: NONE SEEN [LPF]
CRYSTALS: NONE SEEN [HPF]
RBC / HPF: NONE SEEN RBC/HPF (ref <=2)
Yeast: NONE SEEN [HPF]

## 2016-08-16 MED ORDER — CIPROFLOXACIN HCL 500 MG PO TABS: 500.0000 mg | ORAL_TABLET | Freq: Two times a day (BID) | ORAL | 0 refills | Status: AC

## 2016-08-17 LAB — URINE CULTURE

## 2016-09-23 ENCOUNTER — Encounter: Payer: Self-pay | Admitting: *Deleted

## 2016-09-24 ENCOUNTER — Encounter: Payer: Self-pay | Admitting: *Deleted

## 2016-10-10 ENCOUNTER — Encounter: Payer: Self-pay | Admitting: Physician Assistant

## 2016-10-10 NOTE — Progress Notes (Deleted)
WELCOME TO MEDICARE ANNUAL WELLNESS VISIT AND 3 MONTH FOLLOW UP  Assessment:    Over 40 minutes of exam, counseling, chart review and critical decision making was performed Future Appointments Date Time Provider [place]  10/10/2016 10:00 AM Vicie Mutters, PA-C [id number] None    Plan:   During the course of the visit the patient was educated and counseled about appropriate screening and preventive services including:    Pneumococcal vaccine   Prevnar 13  Influenza vaccine  Td vaccine  Screening electrocardiogram  Bone densitometry screening  Colorectal cancer screening  Diabetes screening  Glaucoma screening  Nutrition counseling   Advanced directives: requested   Subjective:  Monique Thomas is a 66 y.o. female who presents for Medicare Annual Wellness Visit and complete physical.    Her blood pressure {HAS HAS NOT:18834} been controlled at home, today their BP is    She {DOES_DOES NF:[id number] workout. She denies chest pain, shortness of breath, dizziness.  She {ACTION; IS/IS VG:[id number] on cholesterol medication and denies myalgias. Her cholesterol {ACTION; IS/IS NOT:[id number]} at goal. The cholesterol last visit was:   Lab Results  Component Value Date   CHOL 191 06/30/2016   HDL 89 06/30/2016   LDLCALC 84 06/30/2016   TRIG 92 06/30/2016   CHOLHDL 2.1 06/30/2016    She {Has/has not:18111} been working on diet and exercise for prediabetes, and denies {Symptoms; diabetes w/o none:19199}. Last A1C in the office was:  Lab Results  Component Value Date   HGBA1C 5.3 06/30/2016   Patient is on Vitamin D supplement.   Lab Results  Component Value Date   VD25OH 41 10/07/2015     She is on thyroid medication. Her medication {WAS/WAS NOT:[phone]::"was not"} changed last visit.   Lab Results  Component Value Date   TSH 5.12 (H) 06/30/2016  .   She has chronic lower back pain, follows with pain management. She is on gabapentin and oxycodone.  She had lumbar surgery on 04/16/2015 with Dr. Ronnald Ramp, states pain is better at times but still has some nerve pain. She has recurrent UTI's and bladder spasms, follows with urology, and has history of kidney stones. She has depression and is on xanax PRN, declines SSRI/maintenance medication, Strongly encouraged counseling or medication, no SI/HI.  Treated recently for sinus infection with levaquin and prednisone but states she is not any better.  Medication Review: Current Outpatient Prescriptions on File Prior to Visit  Medication Sig Dispense Refill  . ALPRAZolam (XANAX) 1 MG tablet TAKE 1/2-1 TABLET BY MOUTH THREE TIMES DAILY AS NEEDED FOR ANXIETY OR SLEEP 90 tablet 1  . cholecalciferol (VITAMIN D) 1000 UNITS tablet Take 1,000 Units by mouth 2 (two) times daily.     . Cyanocobalamin (B-12 PO) Take 1 tablet by mouth daily.    Marland Kitchen estradiol (ESTRACE) 0.5 MG tablet Take 0.5 mg by mouth daily.    Marland Kitchen HYDROcodone-acetaminophen (NORCO) 5-325 MG tablet Take 1 tablet by mouth every 6 (six) hours as needed for moderate pain. 30 tablet 0  . levothyroxine (SYNTHROID, LEVOTHROID) 88 MCG tablet Take 88 mcg by mouth daily before breakfast. Brand name only 1 QD    . lidocaine (LIDODERM) 5 % APP 1 PATCH AA OF SKIN FOR 12 H THEN TK THE PATCH OFF FOR 12 H  2  . Magnesium 500 MG CAPS Take 2 capsules by mouth daily.     No current facility-administered medications on file prior to visit.     Allergies  Allergen Reactions  . Celebrex [  Celecoxib]     "panic attack"  . Other     Skin bruises very easily and peels back  . Topamax [Topiramate]     MEMORY  . Entex Lq [Phenylephrine-Guaifenesin] Rash  . Fiorinal [Butalbital-Aspirin-Caffeine] Rash    Current Problems (verified) Patient Active Problem List   Diagnosis Date Noted  . S/P lumbar spinal fusion 04/16/2015  . Chronic pain syndrome 03/27/2015  . Medication management 01/15/2014  . Prediabetes 01/15/2014  . Back pain, chronic   . Hyperlipidemia    . Hypothyroidism   . GERD (gastroesophageal reflux disease)   . Anemia   . Anxiety   . Vitamin D deficiency     Screening Tests Immunization History  Administered Date(s) Administered  . Influenza Split 06/29/2015  . Influenza Whole 05/21/2013  . Influenza, High Dose Seasonal PF 05/30/2016  . PPD Test 09/23/2013, 10/01/2014, 02/26/2016  . Pneumococcal Polysaccharide-23 09/23/2013  . Td 09/19/2005  . Tdap 10/07/2015  . Zoster 03/27/2014   Tetanus: 2017 Pneumovax: 2015 Prevnar 13: N/A Flu vaccine: 2017 Zostavax: 2015  LMP: s/p hysterectomy Pap: 07/2015 normal at GYN MGM: 07/2015 at [place]: 2008 Colonoscopy: 2008 due 2018  Names of Other Physician/Practitioners you currently use: 1. [facility] here for primary care 2. Dr. Theodis Sato. Talbert Forest, eye doctor, last visit 08/2016 3. ***, dentist, last visit *** Patient Care Team: [name], MD as PCP - General (Internal Medicine) Darlis Loan, MD as Referring Physician (Gastroenterology) Vania Rea, MD as Consulting Physician (Obstetrics and Gynecology) Carolan Clines, MD as Consulting Physician (Urology) Carlynn Spry, PA-C as Physician Assistant (Orthopedic Surgery) Lavonna Monarch, MD as Consulting Physician (Dermatology) Gatha Mayer, MD as Consulting Physician (Gastroenterology) Webb Laws, OD as Referring Physician (Optometry)  SURGICAL HISTORY She  has a past surgical history that includes Abdominal hysterectomy; Cesarean section; Hernia repair; Appendectomy; Tonsillectomy and adenoidectomy; Spine surgery; Laparoscopic lysis intestinal adhesions; and Maximum access (mas)posterior lumbar interbody fusion (plif) 1 level (N/A, 04/16/2015). FAMILY HISTORY Her family history includes Asthma in her sister; Cancer in her maternal grandmother; Heart disease in her mother; Hyperlipidemia in her mother; Thyroid disease in her mother. SOCIAL HISTORY She  reports that she  has never smoked. She does not have any smokeless tobacco history on file. She reports that she does not drink alcohol or use drugs.   MEDICARE WELLNESS OBJECTIVES: Physical activity:   Cardiac risk factors:   Depression/mood screen:   No flowsheet data found.  ADLs:  No flowsheet data found.   Cognitive Testing  Alert? Yes  Normal Appearance?Yes  Oriented to person? Yes  Place? Yes   Time? Yes  Recall of three objects?  Yes  Can perform simple calculations? Yes  Displays appropriate judgment?Yes  Can read the correct time from a watch face?Yes  EOL planning:    ROS   Objective:     There were no vitals filed for this visit. There is no height or weight on file to calculate BMI.  General appearance: alert, no distress, WD/WN, female HEENT: normocephalic, sclerae anicteric, TMs pearly, nares patent, no discharge or erythema, pharynx normal Oral cavity: MMM, no lesions Neck: supple, no lymphadenopathy, no thyromegaly, no masses Heart: RRR, normal S1, S2, no murmurs Lungs: CTA bilaterally, no wheezes, rhonchi, or rales Abdomen: +bs, soft, non tender, non distended, no masses, no hepatomegaly, no splenomegaly Musculoskeletal: nontender, no swelling, no obvious deformity Extremities: no edema, no cyanosis, no clubbing Pulses: 2+ symmetric, upper and lower extremities, normal cap refill Neurological: alert,  oriented x 3, CN2-12 intact, strength normal upper extremities and lower extremities, sensation normal throughout, DTRs 2+ throughout, no cerebellar signs, gait normal Psychiatric: normal affect, behavior normal, pleasant   Medicare Attestation I have personally reviewed: The patient's medical and social history Their use of alcohol, tobacco or illicit drugs Their current medications and supplements The patient's functional ability including ADLs,fall risks, home safety risks, cognitive, and hearing and visual impairment Diet and physical activities Evidence for  depression or mood disorders  The patient's weight, height, BMI, and visual acuity have been recorded in the chart.  I have made referrals, counseling, and provided education to the patient based on review of the above and I have provided the patient with a written personalized care plan for preventive services.     Vicie Mutters, PA-C   10/10/2016

## 2016-10-31 ENCOUNTER — Encounter: Payer: Self-pay | Admitting: Physician Assistant

## 2016-10-31 ENCOUNTER — Ambulatory Visit (INDEPENDENT_AMBULATORY_CARE_PROVIDER_SITE_OTHER): Payer: Medicare Other | Admitting: Physician Assistant

## 2016-10-31 VITALS — BP 134/70 | HR 65 | Temp 97.4°F | Resp 16 | Ht 61.0 in | Wt 120.2 lb

## 2016-10-31 DIAGNOSIS — R6889 Other general symptoms and signs: Secondary | ICD-10-CM

## 2016-10-31 DIAGNOSIS — M549 Dorsalgia, unspecified: Secondary | ICD-10-CM

## 2016-10-31 DIAGNOSIS — G8929 Other chronic pain: Secondary | ICD-10-CM | POA: Diagnosis not present

## 2016-10-31 DIAGNOSIS — E559 Vitamin D deficiency, unspecified: Secondary | ICD-10-CM

## 2016-10-31 DIAGNOSIS — Z0001 Encounter for general adult medical examination with abnormal findings: Secondary | ICD-10-CM | POA: Diagnosis not present

## 2016-10-31 DIAGNOSIS — Z136 Encounter for screening for cardiovascular disorders: Secondary | ICD-10-CM

## 2016-10-31 DIAGNOSIS — G894 Chronic pain syndrome: Secondary | ICD-10-CM | POA: Diagnosis not present

## 2016-10-31 DIAGNOSIS — K2100 Gastro-esophageal reflux disease with esophagitis, without bleeding: Secondary | ICD-10-CM

## 2016-10-31 DIAGNOSIS — D649 Anemia, unspecified: Secondary | ICD-10-CM

## 2016-10-31 DIAGNOSIS — Z79899 Other long term (current) drug therapy: Secondary | ICD-10-CM

## 2016-10-31 DIAGNOSIS — F419 Anxiety disorder, unspecified: Secondary | ICD-10-CM

## 2016-10-31 DIAGNOSIS — Z0000 Encounter for general adult medical examination without abnormal findings: Secondary | ICD-10-CM

## 2016-10-31 DIAGNOSIS — Z981 Arthrodesis status: Secondary | ICD-10-CM

## 2016-10-31 DIAGNOSIS — I10 Essential (primary) hypertension: Secondary | ICD-10-CM | POA: Diagnosis not present

## 2016-10-31 DIAGNOSIS — E782 Mixed hyperlipidemia: Secondary | ICD-10-CM

## 2016-10-31 DIAGNOSIS — E039 Hypothyroidism, unspecified: Secondary | ICD-10-CM

## 2016-10-31 DIAGNOSIS — R7303 Prediabetes: Secondary | ICD-10-CM | POA: Diagnosis not present

## 2016-10-31 LAB — BASIC METABOLIC PANEL WITH GFR
BUN: 17 mg/dL (ref 7–25)
CHLORIDE: 106 mmol/L (ref 98–110)
CO2: 26 mmol/L (ref 20–31)
CREATININE: 0.76 mg/dL (ref 0.50–0.99)
Calcium: 9.6 mg/dL (ref 8.6–10.4)
GFR, Est African American: >89 mL/min (ref >=60)
GFR, Est Non African American: 83 mL/min (ref >=60)
Glucose, Bld: 96 mg/dL (ref 65–99)
Potassium: 4.2 mmol/L (ref 3.5–5.3)
Sodium: 141 mmol/L (ref 135–146)

## 2016-10-31 LAB — HEPATIC FUNCTION PANEL
ALK PHOS: 53 U/L (ref 33–130)
ALT: 9 U/L (ref 6–29)
AST: 18 U/L (ref 10–35)
Albumin: 4.4 g/dL (ref 3.6–5.1)
BILIRUBIN DIRECT: 0.1 mg/dL (ref <=0.2)
BILIRUBIN INDIRECT: 0.2 mg/dL (ref 0.2–1.2)
BILIRUBIN TOTAL: 0.3 mg/dL (ref 0.2–1.2)
Total Protein: 6.8 g/dL (ref 6.1–8.1)

## 2016-10-31 LAB — LIPID PANEL
CHOL/HDL RATIO: 2.4 ratio (ref <5.0)
CHOLESTEROL: 203 mg/dL — AB (ref <200)
HDL: 85 mg/dL (ref >50)
LDL Cholesterol: 101 mg/dL — ABNORMAL HIGH (ref <100)
Triglycerides: 86 mg/dL (ref <150)
VLDL: 17 mg/dL (ref <30)

## 2016-10-31 LAB — TSH: TSH: 13.11 mIU/L — ABNORMAL HIGH

## 2016-10-31 NOTE — Patient Instructions (Signed)
Umbilical Hernia, Adult  A hernia is a bulge of tissue that pushes through an opening between muscles. An umbilical hernia happens in the abdomen, near the belly button (umbilicus). The hernia may contain tissues from the small intestine, large intestine, or fatty tissue covering the intestines (omentum). Umbilical hernias in adults tend to get worse over time, and they require surgical treatment.  There are several types of umbilical hernias. You may have:  · A hernia located just above or below the umbilicus (indirect hernia). This is the most common type of umbilical hernia in adults.  · A hernia that forms through an opening formed by the umbilicus (direct hernia).  · A hernia that comes and goes (reducible hernia). A reducible hernia may be visible only when you strain, lift something heavy, or cough. This type of hernia can be pushed back into the abdomen (reduced).  · A hernia that traps abdominal tissue inside the hernia (incarcerated hernia). This type of hernia cannot be reduced.  · A hernia that cuts off blood flow to the tissues inside the hernia (strangulated hernia). The tissues can start to die if this happens. This type of hernia requires emergency treatment.    What are the causes?  An umbilical hernia happens when tissue inside the abdomen presses on a weak area of the abdominal muscles.  What increases the risk?  You may have a greater risk of this condition if you:  · Are obese.  · Have had several pregnancies.  · Have a buildup of fluid inside your abdomen (ascites).  · Have had surgery that weakens the abdominal muscles.    What are the signs or symptoms?  The main symptom of this condition is a painless bulge at or near the belly button. A reducible hernia may be visible only when you strain, lift something heavy, or cough. Other symptoms may include:  · Dull pain.  · A feeling of pressure.    Symptoms of a strangulated hernia may include:  · Pain that gets increasingly worse.  · Nausea and  vomiting.  · Pain when pressing on the hernia.  · Skin over the hernia becoming red or purple.  · Constipation.  · Blood in the stool.    How is this diagnosed?  This condition may be diagnosed based on:  · A physical exam. You may be asked to cough or strain while standing. These actions increase the pressure inside your abdomen and force the hernia through the opening in your muscles. Your health care provider may try to reduce the hernia by pressing on it.  · Your symptoms and medical history.    How is this treated?  Surgery is the only treatment for an umbilical hernia. Surgery for a strangulated hernia is done as soon as possible. If you have a small hernia that is not incarcerated, you may need to lose weight before having surgery.  Follow these instructions at home:  · Lose weight, if told by your health care provider.  · Do not try to push the hernia back in.  · Watch your hernia for any changes in color or size. Tell your health care provider if any changes occur.  · You may need to avoid activities that increase pressure on your hernia.  · Do not lift anything that is heavier than 10 lb (4.5 kg) until your health care provider says that this is safe.  · Take over-the-counter and prescription medicines only as told by your health care provider.  ·   Keep all follow-up visits as told by your health care provider. This is important.  Contact a health care provider if:  · Your hernia gets larger.  · Your hernia becomes painful.  Get help right away if:  · You develop sudden, severe pain near the area of your hernia.  · You have pain as well as nausea or vomiting.  · You have pain and the skin over your hernia changes color.  · You develop a fever.  This information is not intended to replace advice given to you by your health care provider. Make sure you discuss any questions you have with your health care provider.  Document Released: 02/05/2016 Document Revised: 05/08/2016 Document Reviewed:  02/05/2016  [employer] Interactive Patient Education © 2017 [employer]

## 2016-10-31 NOTE — Progress Notes (Signed)
WELCOME TO MEDICARE WELLNESS VISIT  Assessment:   Hypothyroidism, unspecified type Hypothyroidism-check TSH level, continue medications the same, reminded to take on an empty stomach 30-106mins before food.  -     TSH  Mixed hyperlipidemia -continue medications, check lipids, decrease fatty foods, increase activity.  -     Lipid panel  Prediabetes -     BASIC METABOLIC PANEL WITH GFR  Vitamin D deficiency -     VITAMIN D 25 Hydroxy (Vit-D Deficiency, Fractures)  Medication management -     CBC with Differential/Platelet -     Hepatic function panel -     Magnesium  Anemia, unspecified type -     CBC with Differential/Platelet  Anxiety  Continue xanax PRN  Chronic back pain, unspecified back location, unspecified back pain laterality Continue follow up   Chronic pain syndrome Continue follow up   S/P lumbar spinal fusion Continue follow up   Gastroesophageal reflux disease with esophagitis Continue PPI/H2 blocker, diet discussed  Screening for cardiovascular condition -     EKG 12-Lead -     [name], RETROPERITNL ABD,  LTD  Need prevnar next OV, out of in the office Over 40 minutes of exam, counseling, chart review and critical decision making was performed Future Appointments Date Time Provider Welcome  10/31/2017 2:00 PM Vicie Mutters, PA-C [facility] None     Plan:   During the course of the visit the patient was educated and counseled about appropriate screening and preventive services including:    Pneumococcal vaccine   Prevnar 13  Influenza vaccine  Td vaccine  Screening electrocardiogram  Bone densitometry screening  Colorectal cancer screening  Diabetes screening  Glaucoma screening  Nutrition counseling   Advanced directives: requested   Subjective:  Monique Thomas is a 66 y.o. female who presents for Medicare Annual Wellness Visit and  Follow up for depression, thyroid, chol.     Her blood pressure has been  controlled at home, today their BP is BP: 134/70 She does not workout. She denies chest pain, shortness of breath, dizziness.  She is not on cholesterol medication and denies myalgias. Her cholesterol is at goal. The cholesterol last visit was:   Lab Results  Component Value Date   CHOL 191 06/30/2016   HDL 89 06/30/2016   LDLCALC 84 06/30/2016   TRIG 92 06/30/2016   CHOLHDL 2.1 06/30/2016   Lab Results  Component Value Date   HGBA1C 5.3 06/30/2016   Patient is on Vitamin D supplement.   Lab Results  Component Value Date   VD25OH 41 10/07/2015     She is on thyroid medication. Her medication was changed last visit, she is now on 25mcg x 4 days only, has had it checked at GYN. Lab Results  Component Value Date   TSH 5.12 (H) 06/30/2016  .  BMI is Body mass index is 22.71 kg/m., she is working on diet and exercise. Wt Readings from Last 3 Encounters:  10/31/16 120 lb 3.2 oz (54.5 kg)  06/30/16 118 lb (53.5 kg)  06/21/16 116 lb 12.8 oz (53 kg)   She has chronic lower back pain, followed by pain management, had lumbar surgery with Dr. Ronnald Ramp 2016, is on gabapentin and oxycodone.  She has recurrent UTI's, follows urology and has hx of kidney stones and now doing pelvic floor PT and it is doing better.   She has depression, is on xanax PRN, declines SSRI medication.   Medication Review: Current Outpatient Prescriptions on File  Prior to Visit  Medication Sig Dispense Refill  . ALPRAZolam (XANAX) 1 MG tablet TAKE 1/2-1 TABLET BY MOUTH THREE TIMES DAILY AS NEEDED FOR ANXIETY OR SLEEP 90 tablet 1  . cholecalciferol (VITAMIN D) 1000 UNITS tablet Take 1,000 Units by mouth 2 (two) times daily.     . Cyanocobalamin (B-12 PO) Take 1 tablet by mouth daily.    Marland Kitchen estradiol (ESTRACE) 0.5 MG tablet Take 0.5 mg by mouth daily.    Marland Kitchen HYDROcodone-acetaminophen (NORCO) 5-325 MG tablet Take 1 tablet by mouth every 6 (six) hours as needed for moderate pain. 30 tablet 0  . levothyroxine (SYNTHROID,  LEVOTHROID) 88 MCG tablet Take 88 mcg by mouth daily before breakfast. Brand name only 1 QD    . lidocaine (LIDODERM) 5 % APP 1 PATCH AA OF SKIN FOR 12 H THEN TK THE PATCH OFF FOR 12 H  2  . Magnesium 500 MG CAPS Take 2 capsules by mouth daily.     No current facility-administered medications on file prior to visit.     Allergies  Allergen Reactions  . Celebrex [Celecoxib]     "panic attack"  . Other     Skin bruises very easily and peels back  . Topamax [Topiramate]     MEMORY  . Entex Lq [Phenylephrine-Guaifenesin] Rash  . Fiorinal [Butalbital-Aspirin-Caffeine] Rash    Current Problems (verified) Patient Active Problem List   Diagnosis Date Noted  . S/P lumbar spinal fusion 04/16/2015  . Chronic pain syndrome 03/27/2015  . Medication management 01/15/2014  . Prediabetes 01/15/2014  . Back pain, chronic   . Hyperlipidemia   . Hypothyroidism   . GERD (gastroesophageal reflux disease)   . Anemia   . Anxiety   . Vitamin D deficiency     Screening Tests Immunization History  Administered Date(s) Administered  . Influenza Split 06/29/2015  . Influenza Whole 05/21/2013  . Influenza, High Dose Seasonal PF 05/30/2016  . PPD Test 09/23/2013, 10/01/2014, 02/26/2016  . Pneumococcal Polysaccharide-23 09/23/2013  . Td 09/19/2005  . Tdap 10/07/2015  . Zoster 03/27/2014   Preventative care: Last colonoscopy: 2008 due this year Last mammogram: 09/2016 [facility] Last pap smear/pelvic exam: 07/2015 GYN normal, [facility] [id number]  Prior vaccinations: TD or Tdap: 2017  Influenza: 2017 Pneumococcal: 2015 Prevnar13: Due but out in the office Shingles/Zostavax: 2015  Names of Other Physician/Practitioners you currently use: 1. [facility] here for primary care 2. Dr. Theodis Sato. Talbert Forest, eye doctor, last visit 08/2016 3. Dr. Toy Cookey, dentist, last visit q 6 months Patient Care Team: [name], MD as PCP - General (Internal  Medicine) Darlis Loan, MD as Referring Physician (Gastroenterology) Vania Rea, MD as Consulting Physician (Obstetrics and Gynecology) Carolan Clines, MD as Consulting Physician (Urology) Carlynn Spry, PA-C as Physician Assistant (Orthopedic Surgery) Lavonna Monarch, MD as Consulting Physician (Dermatology) Gatha Mayer, MD as Consulting Physician (Gastroenterology) Webb Laws, OD as Referring Physician (Optometry)  SURGICAL HISTORY She  has a past surgical history that includes Abdominal hysterectomy; Cesarean section; Hernia repair; Appendectomy; Tonsillectomy and adenoidectomy; Spine surgery; Laparoscopic lysis intestinal adhesions; and Maximum access (mas)posterior lumbar interbody fusion (plif) 1 level (N/A, 04/16/2015). FAMILY HISTORY Her family history includes Asthma in her sister; Cancer in her maternal grandmother; Heart disease in her mother; Hyperlipidemia in her mother; Thyroid disease in her mother. SOCIAL HISTORY She  reports that she has never smoked. She has never used smokeless tobacco. She reports that she does not drink alcohol or use drugs.  MEDICARE WELLNESS OBJECTIVES: Physical activity: Current Exercise Habits: Home exercise routine, Type of exercise: walking, Time (Minutes): 30, Frequency (Times/Week): 4, Weekly Exercise (Minutes/Week): 120, Intensity: Mild Cardiac risk factors: Cardiac Risk Factors include: advanced age (>25men, >23 women);dyslipidemia;hypertension Depression/mood screen:   Depression screen [facility] 2/9 10/31/2016  Decreased Interest 0  Down, Depressed, Hopeless 0  PHQ - 2 Score 0    ADLs:  In your present state of health, do you have any difficulty performing the following activities: 10/31/2016  Hearing? N  Vision? N  Difficulty concentrating or making decisions? N  Walking or climbing stairs? N  Dressing or bathing? N  Doing errands, shopping? N  Some recent data might be hidden    Cognitive Testing  Alert? Yes  Normal  Appearance?Yes  Oriented to person? Yes  Place? Yes   Time? Yes  Recall of three objects?  Yes  Can perform simple calculations? Yes  Displays appropriate judgment?Yes  Can read the correct time from a watch face?Yes  EOL planning: Does Patient Have a Medical Advance Directive?: No Would patient like information on creating a medical advance directive?: Yes (MAU/Ambulatory/Procedural Areas - Information given)  Review of Systems  Constitutional: Negative for chills, fever and malaise/fatigue.  HENT: Negative for congestion, ear pain and sore throat.   Eyes: Negative.   Respiratory: Negative for cough, shortness of breath and wheezing.   Cardiovascular: Negative for chest pain, palpitations and leg swelling.  Gastrointestinal: Positive for constipation. Negative for abdominal pain, blood in stool, diarrhea, heartburn and melena.  Genitourinary: Negative.   Musculoskeletal: Positive for back pain.  Skin: Negative.   Neurological: Negative for dizziness, sensory change, loss of consciousness and headaches.  Psychiatric/Behavioral: Negative for depression. The patient is not nervous/anxious and does not have insomnia.      Objective:     Today's Vitals   10/31/16 1413  BP: 134/70  Pulse: 65  Resp: 16  Temp: 97.4 F (36.3 C)  SpO2: 97%  Weight: 120 lb 3.2 oz (54.5 kg)  Height: 5\' 1"  (1.549 m)  PainSc: 3   PainLoc: Back   Body mass index is 22.71 kg/m.  General appearance: alert, no distress, WD/WN, female HEENT: normocephalic, sclerae anicteric, TMs pearly, nares patent, no discharge or erythema, pharynx normal Oral cavity: MMM, no lesions Neck: supple, no lymphadenopathy, no thyromegaly, no masses Heart: RRR, normal S1, S2, no murmurs Lungs: CTA bilaterally, no wheezes, rhonchi, or rales Abdomen: +bs, soft, non tender, non distended, no masses, no hepatomegaly, no splenomegaly Musculoskeletal: nontender, no swelling, no obvious deformity Extremities: no edema, no  cyanosis, no clubbing Pulses: 2+ symmetric, upper and lower extremities, normal cap refill Neurological: alert, oriented x 3, CN2-12 intact, strength normal upper extremities and lower extremities, sensation normal throughout, DTRs 2+ throughout, no cerebellar signs, gait normal Psychiatric: normal affect, behavior normal, pleasant   Medicare Attestation I have personally reviewed: The patient's medical and social history Their use of alcohol, tobacco or illicit drugs Their current medications and supplements The patient's functional ability including ADLs,fall risks, home safety risks, cognitive, and hearing and visual impairment Diet and physical activities Evidence for depression or mood disorders  The patient's weight, height, BMI, and visual acuity have been recorded in the chart.  I have made referrals, counseling, and provided education to the patient based on review of the above and I have provided the patient with a written personalized care plan for preventive services.     Vicie Mutters, PA-C   10/31/2016

## 2016-11-01 ENCOUNTER — Other Ambulatory Visit: Payer: Self-pay | Admitting: Physician Assistant

## 2016-11-01 DIAGNOSIS — F419 Anxiety disorder, unspecified: Secondary | ICD-10-CM

## 2016-11-01 LAB — CBC WITH DIFFERENTIAL/PLATELET
Basophils Absolute: 0 cells/uL (ref 0–200)
Basophils Relative: 0 %
EOS PCT: 2 %
Eosinophils Absolute: 106 cells/uL (ref 15–500)
HCT: 38.1 % (ref 35.0–45.0)
HEMOGLOBIN: 12.5 g/dL (ref 11.7–15.5)
LYMPHS ABS: 1908 {cells}/uL (ref 850–3900)
Lymphocytes Relative: 36 %
MCH: 30.2 pg (ref 27.0–33.0)
MCHC: 32.8 g/dL (ref 32.0–36.0)
MCV: 92 fL (ref 80.0–100.0)
MPV: 8.1 fL (ref 7.5–12.5)
Monocytes Absolute: 318 cells/uL (ref 200–950)
Monocytes Relative: 6 %
NEUTROS ABS: 2968 {cells}/uL (ref 1500–7800)
Neutrophils Relative %: 56 %
Platelets: 362 10*3/uL (ref 140–400)
RBC: 4.14 MIL/uL (ref 3.80–5.10)
RDW: 13.2 % (ref 11.0–15.0)
WBC: 5.3 10*3/uL (ref 3.8–10.8)

## 2016-11-01 LAB — MAGNESIUM: MAGNESIUM: 2.1 mg/dL (ref 1.5–2.5)

## 2016-11-01 LAB — VITAMIN D 25 HYDROXY (VIT D DEFICIENCY, FRACTURES): VIT D 25 HYDROXY: 41 ng/mL (ref 30–100)

## 2016-11-01 MED ORDER — ALPRAZOLAM 1 MG PO TABS: ORAL_TABLET | ORAL | 1 refills | Status: DC

## 2016-11-01 NOTE — Progress Notes (Signed)
Xanax was called into pharmacy @ 8:58am on 13th Feb 2018 by DD

## 2016-11-01 NOTE — Progress Notes (Signed)
Pt aware of lab results & voiced understanding of those results. Pt was then transferred to front office to schedule: Recheck lab only in one month.

## 2016-11-03 ENCOUNTER — Other Ambulatory Visit: Payer: Self-pay | Admitting: Internal Medicine

## 2016-11-03 DIAGNOSIS — F419 Anxiety disorder, unspecified: Secondary | ICD-10-CM

## 2016-11-29 ENCOUNTER — Other Ambulatory Visit: Payer: Self-pay

## 2016-11-29 ENCOUNTER — Other Ambulatory Visit: Payer: Self-pay | Admitting: Anesthesiology

## 2016-11-29 ENCOUNTER — Other Ambulatory Visit: Payer: Medicare Other

## 2016-11-29 DIAGNOSIS — M5416 Radiculopathy, lumbar region: Secondary | ICD-10-CM

## 2016-11-29 DIAGNOSIS — E039 Hypothyroidism, unspecified: Secondary | ICD-10-CM

## 2016-11-29 LAB — TSH: TSH: 2.1 m[IU]/L

## 2016-11-30 ENCOUNTER — Ambulatory Visit (INDEPENDENT_AMBULATORY_CARE_PROVIDER_SITE_OTHER): Payer: Medicare Other | Admitting: Internal Medicine

## 2016-11-30 ENCOUNTER — Encounter: Payer: Self-pay | Admitting: Internal Medicine

## 2016-11-30 VITALS — BP 118/66 | HR 88 | Temp 98.2°F | Resp 16 | Ht 61.0 in | Wt 120.0 lb

## 2016-11-30 DIAGNOSIS — J0100 Acute maxillary sinusitis, unspecified: Secondary | ICD-10-CM | POA: Diagnosis not present

## 2016-11-30 MED ORDER — PREDNISONE 20 MG PO TABS: ORAL_TABLET | ORAL | 0 refills | Status: DC

## 2016-11-30 MED ORDER — PROMETHAZINE-DM 6.25-15 MG/5ML PO SYRP: ORAL_SOLUTION | ORAL | 1 refills | Status: DC

## 2016-11-30 MED ORDER — AZELASTINE HCL 0.1 % NA SOLN: 2.0000 | Freq: Two times a day (BID) | NASAL | 2 refills | Status: DC

## 2016-11-30 MED ORDER — FLUTICASONE PROPIONATE 50 MCG/ACT NA SUSP: 2.0000 | Freq: Every day | NASAL | 0 refills | Status: DC

## 2016-11-30 NOTE — Progress Notes (Signed)
LVM for pt to return office call for LAB results.

## 2016-11-30 NOTE — Progress Notes (Signed)
HPI  Patient presents to the office for evaluation of cough.  It has been going on for 2 days.  Patient reports minimal cough.  They also endorse change in voice, postnasal drip, wheezing and congestion, ear pain, sore throat.  .  They have tried zyrtec.  They report that nothing has worked.  They admits to other sick contacts.  Her sister has also recently had some bronchitis.    ROS  PE:  Vitals:   11/30/16 1409  BP: 118/66  Pulse: 88  Resp: 16  Temp: 98.2 F (36.8 C)    General:  Alert and non-toxic, WDWN, NAD HEENT: NCAT, PERLA, EOM normal, no occular discharge or erythema.  Nasal mucosal edema with sinus tenderness to palpation.  Oropharynx clear with minimal oropharyngeal edema and erythema.  Mucous membranes moist and pink. Neck:  Cervical adenopathy Chest:  RRR no MRGs.  Lungs clear to auscultation A&P with no wheezes rhonchi or rales.   Abdomen: +BS x 4 quadrants, soft, non-tender, no guarding, rigidity, or rebound. Skin: warm and dry no rash Neuro: A&Ox4, CN II-XII grossly intact  Assessment and Plan:   1. Acute non-recurrent maxillary sinusitis -prednisone -flonase -astelin -zyrtec -benadryl qhs -nasal saline -phenergan dm -likely viral in nature, patient aware that even with therapy she will likely have symptoms for 2-3 weeks.  She will call if fever or worsening of symptoms.

## 2016-11-30 NOTE — Patient Instructions (Signed)
Use flonase at bedtime.  Use astelin in the morning and at bedtime.  Please take zyrtec daily.  Please take 2 tablets of benadryl at bedtime  Take prednisone as instructed with breakfast.  Please take cough syrup 3 times a day as needed.  Please use sudafed if desired.  You can take it up to 4 times daily.  Stop if palpitations or chest pains.

## 2016-12-12 ENCOUNTER — Inpatient Hospital Stay
Admission: RE | Admit: 2016-12-12 | Discharge: 2016-12-12 | Disposition: A | Discharge status: Home / Self Care | Payer: Medicare Other | Source: Ambulatory Visit | Attending: Anesthesiology | Admitting: Anesthesiology

## 2016-12-23 ENCOUNTER — Ambulatory Visit
Admission: RE | Admit: 2016-12-23 | Discharge: 2016-12-23 | Disposition: A | Discharge status: Home / Self Care | Payer: Medicare Other | Source: Ambulatory Visit | Attending: Anesthesiology | Admitting: Anesthesiology

## 2016-12-23 DIAGNOSIS — M5416 Radiculopathy, lumbar region: Secondary | ICD-10-CM

## 2016-12-23 MED ORDER — GADOBENATE DIMEGLUMINE 529 MG/ML IV SOLN
11.0000 mL | Freq: Once | INTRAVENOUS | Status: AC | PRN
  Administered 2016-12-23: 11 mL via INTRAVENOUS

## 2017-01-09 DIAGNOSIS — E2839 Other primary ovarian failure: Secondary | ICD-10-CM | POA: Insufficient documentation

## 2017-01-11 ENCOUNTER — Other Ambulatory Visit: Payer: Self-pay | Admitting: Neurological Surgery

## 2017-01-11 DIAGNOSIS — M545 Low back pain: Secondary | ICD-10-CM

## 2017-01-11 DIAGNOSIS — R5381 Other malaise: Secondary | ICD-10-CM

## 2017-01-19 ENCOUNTER — Other Ambulatory Visit: Payer: Medicare Other

## 2017-01-19 DIAGNOSIS — R3915 Urgency of urination: Secondary | ICD-10-CM

## 2017-01-20 ENCOUNTER — Other Ambulatory Visit: Payer: Self-pay | Admitting: Neurological Surgery

## 2017-01-20 ENCOUNTER — Other Ambulatory Visit: Payer: Self-pay | Admitting: Physician Assistant

## 2017-01-20 DIAGNOSIS — E2839 Other primary ovarian failure: Secondary | ICD-10-CM

## 2017-01-20 LAB — URINALYSIS, MICROSCOPIC ONLY
BACTERIA UA: NONE SEEN [HPF]
CASTS: NONE SEEN [LPF]
Crystals: NONE SEEN [HPF]
YEAST: NONE SEEN [HPF]

## 2017-01-20 LAB — URINALYSIS, ROUTINE W REFLEX MICROSCOPIC
BILIRUBIN URINE: NEGATIVE
Glucose, UA: NEGATIVE
Hgb urine dipstick: NEGATIVE
Ketones, ur: NEGATIVE
Nitrite: NEGATIVE
Protein, ur: NEGATIVE
SPECIFIC GRAVITY, URINE: 1.018 (ref 1.001–1.035)
pH: 5.5 (ref 5.0–8.0)

## 2017-01-20 LAB — URINE CULTURE

## 2017-01-20 MED ORDER — CIPROFLOXACIN HCL 500 MG PO TABS: 500.0000 mg | ORAL_TABLET | Freq: Two times a day (BID) | ORAL | 0 refills | Status: DC

## 2017-01-20 NOTE — Progress Notes (Signed)
Pt aware of lab results & voiced understanding of those results.

## 2017-01-23 NOTE — Progress Notes (Signed)
LVM for pt to return office call for LAB results.

## 2017-01-23 NOTE — Progress Notes (Signed)
Pt aware of lab results & voiced understanding of those results.

## 2017-01-24 ENCOUNTER — Ambulatory Visit
Admission: RE | Admit: 2017-01-24 | Discharge: 2017-01-24 | Disposition: A | Discharge status: Home / Self Care | Payer: Medicare Other | Source: Ambulatory Visit | Attending: Neurological Surgery | Admitting: Neurological Surgery

## 2017-01-24 DIAGNOSIS — M545 Low back pain: Secondary | ICD-10-CM

## 2017-01-24 DIAGNOSIS — E2839 Other primary ovarian failure: Secondary | ICD-10-CM

## 2017-02-01 ENCOUNTER — Other Ambulatory Visit: Payer: Self-pay | Admitting: Physician Assistant

## 2017-02-01 DIAGNOSIS — F419 Anxiety disorder, unspecified: Secondary | ICD-10-CM

## 2017-02-01 MED ORDER — ALPRAZOLAM 1 MG PO TABS: 1.0000 mg | ORAL_TABLET | Freq: Three times a day (TID) | ORAL | 0 refills | Status: DC

## 2017-02-02 NOTE — Progress Notes (Signed)
Xanax called into pharmacy on 17th May 2018 @ 9:08am by DD

## 2017-02-17 ENCOUNTER — Other Ambulatory Visit: Payer: Self-pay | Admitting: Neurological Surgery

## 2017-03-03 ENCOUNTER — Other Ambulatory Visit: Payer: Self-pay | Admitting: Internal Medicine

## 2017-03-03 MED ORDER — PREDNISONE 20 MG PO TABS: ORAL_TABLET | ORAL | 0 refills | Status: DC

## 2017-03-28 NOTE — Pre-Procedure Instructions (Signed)
Monique Thomas  03/28/2017      [id number] - [address], [place] [address] Phone: [phone] Fax: [phone]  [facility] [place], [place] - [name] [place] [name] [place] [address] Phone: [phone] Fax: [place], [place] [address] Phone: [phone] Fax: [phone]    Your procedure is scheduled on Friday, April 07, 2017.  Report to [facility] Admitting at [place].M.  Call this number if you have problems the morning of surgery:  [phone]   Remember:  Do not eat food or drink liquids after midnight.  Take these medicines the morning of surgery with A SIP OF WATER: Alprazolam (Xanax) - if needed Estradiol (Estrace) Synthroid  7 days prior to surgery STOP taking any Aspirin, Aleve, Naproxen, Ibuprofen, Motrin, Advil, Goody's, BC's, all herbal medications, fish oil, and all vitamins     Do not wear jewelry, make-up or nail polish.  Do not wear lotions, powders, or perfumes, or deodorant.  Do not shave 48 hours prior to surgery.    Do not bring valuables to the hospital.  [facility] is not responsible for any belongings or valuables.  Contacts, dentures or bridgework may not be worn into surgery.  Leave your suitcase in the car.  After surgery it may be brought to your room.  For patients admitted to the hospital, discharge time will be determined by your treatment team.  Patients discharged the day of surgery will not be allowed to drive home.   Name and phone number of your driver:    Special instructions:   [facility]- Preparing For Surgery  Before surgery, you can play an important role. Because skin is not sterile, your skin needs to be as free of germs as  possible. You can reduce the number of germs on your skin by washing with CHG (chlorahexidine gluconate) Soap before surgery.  CHG is an antiseptic cleaner which kills germs and bonds with the skin to continue killing germs even after washing.  Please do not use if you have an allergy to CHG or antibacterial soaps. If your skin becomes reddened/irritated stop using the CHG.  Do not shave (including legs and underarms) for at least 48 hours prior to first CHG shower. It is OK to shave your face.  Please follow these instructions carefully.   1. Shower the NIGHT BEFORE SURGERY and the MORNING OF SURGERY with CHG.   2. If you chose to wash your hair, wash your hair first as usual with your normal shampoo.  3. After you shampoo, rinse your hair and body thoroughly to remove the shampoo.  4. Use CHG as you would any other liquid soap. You can apply CHG directly to the skin and wash gently with a scrungie or a clean washcloth.   5. Apply the CHG Soap to your body ONLY FROM THE NECK DOWN.  Do not use on open wounds or open sores. Avoid contact with your eyes, ears, mouth and genitals (private parts). Wash genitals (private parts) with your normal soap.  6. Wash thoroughly, paying special attention to the area where your surgery will be performed.  7. Thoroughly rinse your body with warm water from  the neck down.  8. DO NOT shower/wash with your normal soap after using and rinsing off the CHG Soap.  9. Pat yourself dry with a CLEAN TOWEL.   10. Wear CLEAN PAJAMAS   11. Place CLEAN SHEETS on your bed the night of your first shower and DO NOT SLEEP WITH PETS.    Day of Surgery: Do not apply any deodorants/lotions. Please wear clean clothes to the hospital/surgery center.      Please read over the following fact sheets that you were given. Pain Booklet, Coughing and Deep Breathing, MRSA Information and Surgical Site Infection Prevention

## 2017-03-29 ENCOUNTER — Encounter (HOSPITAL_COMMUNITY): Payer: Self-pay

## 2017-03-29 ENCOUNTER — Encounter (HOSPITAL_COMMUNITY)
Admission: RE | Admit: 2017-03-29 | Discharge: 2017-03-29 | Disposition: A | Discharge status: Home / Self Care | Payer: Medicare Other | Source: Ambulatory Visit | Attending: Neurological Surgery | Admitting: Neurological Surgery

## 2017-03-29 ENCOUNTER — Ambulatory Visit (HOSPITAL_COMMUNITY)
Admission: RE | Admit: 2017-03-29 | Discharge: 2017-03-29 | Disposition: A | Discharge status: Home / Self Care | Payer: Medicare Other | Source: Ambulatory Visit | Attending: Neurological Surgery | Admitting: Neurological Surgery

## 2017-03-29 DIAGNOSIS — Z01818 Encounter for other preprocedural examination: Secondary | ICD-10-CM | POA: Diagnosis not present

## 2017-03-29 DIAGNOSIS — M5136 Other intervertebral disc degeneration, lumbar region: Secondary | ICD-10-CM

## 2017-03-29 DIAGNOSIS — M48061 Spinal stenosis, lumbar region without neurogenic claudication: Secondary | ICD-10-CM | POA: Diagnosis not present

## 2017-03-29 LAB — BASIC METABOLIC PANEL
ANION GAP: 6 (ref 5–15)
BUN: 14 mg/dL (ref 6–20)
CALCIUM: 9.3 mg/dL (ref 8.9–10.3)
CO2: 25 mmol/L (ref 22–32)
CREATININE: 0.65 mg/dL (ref 0.44–1.00)
Chloride: 106 mmol/L (ref 101–111)
Glucose, Bld: 95 mg/dL (ref 65–99)
Potassium: 4.2 mmol/L (ref 3.5–5.1)
SODIUM: 137 mmol/L (ref 135–145)

## 2017-03-29 LAB — CBC WITH DIFFERENTIAL/PLATELET
BASOS ABS: 0 10*3/uL (ref 0.0–0.1)
BASOS PCT: 1 %
EOS ABS: 0.2 10*3/uL (ref 0.0–0.7)
Eosinophils Relative: 4 %
HEMATOCRIT: 37.8 % (ref 36.0–46.0)
HEMOGLOBIN: 12.5 g/dL (ref 12.0–15.0)
Lymphocytes Relative: 44 %
Lymphs Abs: 1.8 10*3/uL (ref 0.7–4.0)
MCH: 30 pg (ref 26.0–34.0)
MCHC: 33.1 g/dL (ref 30.0–36.0)
MCV: 90.6 fL (ref 78.0–100.0)
Monocytes Absolute: 0.2 10*3/uL (ref 0.1–1.0)
Monocytes Relative: 5 %
NEUTROS ABS: 1.9 10*3/uL (ref 1.7–7.7)
NEUTROS PCT: 46 %
Platelets: 307 10*3/uL (ref 150–400)
RBC: 4.17 MIL/uL (ref 3.87–5.11)
RDW: 13.2 % (ref 11.5–15.5)
WBC: 4.1 10*3/uL (ref 4.0–10.5)

## 2017-03-29 LAB — SURGICAL PCR SCREEN
MRSA, PCR: NEGATIVE
STAPHYLOCOCCUS AUREUS: POSITIVE — AB

## 2017-03-29 LAB — PROTIME-INR
INR: 1.01
PROTHROMBIN TIME: 13.3 s (ref 11.4–15.2)

## 2017-03-29 LAB — TYPE AND SCREEN
ABO/RH(D): B POS
Antibody Screen: NEGATIVE

## 2017-03-29 MED ORDER — CHLORHEXIDINE GLUCONATE CLOTH 2 % EX PADS: 6.0000 | MEDICATED_PAD | Freq: Once | CUTANEOUS | Status: DC

## 2017-03-29 NOTE — Progress Notes (Signed)
PCP: Dr. [name]  Cardiologist: pt denies  EKG: 10/2016 in [facility]  Stress test: pt denies ever  ECHO: pt denies ever  Cardiac Cath: pt denies ever  Chest x-ray: pt denies past year

## 2017-04-06 MED ORDER — CEFAZOLIN SODIUM-DEXTROSE 2-4 GM/100ML-% IV SOLN
2.0000 g | INTRAVENOUS | Status: AC
  Administered 2017-04-07: 2 g via INTRAVENOUS
  Filled 2017-04-06: qty 100

## 2017-04-06 MED ORDER — DEXAMETHASONE SODIUM PHOSPHATE 10 MG/ML IJ SOLN
10.0000 mg | INTRAMUSCULAR | Status: AC
  Administered 2017-04-07: 10 mg via INTRAVENOUS
  Filled 2017-04-06: qty 1

## 2017-04-06 NOTE — Anesthesia Preprocedure Evaluation (Addendum)
Anesthesia Evaluation  Patient identified by MRN, date of birth, ID band Patient awake    Reviewed: Allergy & Precautions, NPO status , Patient's Chart, lab work & pertinent test results  Airway Mallampati: II  TM Distance: >3 FB Neck ROM: Full    Dental no notable dental hx. (+) Teeth Intact, Dental Advisory Given   Pulmonary neg pulmonary ROS,    Pulmonary exam normal breath sounds clear to auscultation       Cardiovascular negative cardio ROS Normal cardiovascular exam Rhythm:Regular Rate:Normal     Neuro/Psych PSYCHIATRIC DISORDERS Anxiety Depression negative neurological ROS     GI/Hepatic Neg liver ROS, GERD  ,  Endo/Other  Hypothyroidism   Renal/GU Renal disease     Musculoskeletal  (+) Arthritis ,   Abdominal   Peds  Hematology negative hematology ROS (+) anemia ,   Anesthesia Other Findings   Reproductive/Obstetrics negative OB ROS                            Anesthesia Physical  Anesthesia Plan  ASA: II  Anesthesia Plan: General   Post-op Pain Management:    Induction: Intravenous  PONV Risk Score and Plan: 2 and Ondansetron, Dexamethasone, Propofol and Midazolam  Airway Management Planned: Oral ETT  Additional Equipment:   Intra-op Plan:   Post-operative Plan: Extubation in OR  Informed Consent: I have reviewed the patients History and Physical, chart, labs and discussed the procedure including the risks, benefits and alternatives for the proposed anesthesia with the patient or authorized representative who has indicated his/her understanding and acceptance.   Dental advisory given  Plan Discussed with: CRNA  Anesthesia Plan Comments:         Anesthesia Quick Evaluation

## 2017-04-07 ENCOUNTER — Encounter (HOSPITAL_COMMUNITY): Payer: Self-pay

## 2017-04-07 ENCOUNTER — Inpatient Hospital Stay (HOSPITAL_COMMUNITY): Payer: Medicare Other | Admitting: Anesthesiology

## 2017-04-07 ENCOUNTER — Inpatient Hospital Stay (HOSPITAL_COMMUNITY): Payer: Medicare Other

## 2017-04-07 ENCOUNTER — Inpatient Hospital Stay (HOSPITAL_COMMUNITY)
Admission: RE | Admit: 2017-04-07 | Discharge: 2017-04-08 | DRG: 460 | Disposition: A | Discharge status: Home Health | Payer: Medicare Other | Source: Ambulatory Visit | Attending: Neurological Surgery | Admitting: Neurological Surgery

## 2017-04-07 ENCOUNTER — Encounter (HOSPITAL_COMMUNITY)
Admission: RE | Disposition: A | Discharge status: Home Health | Payer: Self-pay | Source: Ambulatory Visit | Attending: Neurological Surgery

## 2017-04-07 DIAGNOSIS — Z981 Arthrodesis status: Secondary | ICD-10-CM | POA: Diagnosis not present

## 2017-04-07 DIAGNOSIS — Z79890 Hormone replacement therapy (postmenopausal): Secondary | ICD-10-CM | POA: Diagnosis not present

## 2017-04-07 DIAGNOSIS — M48061 Spinal stenosis, lumbar region without neurogenic claudication: Principal | ICD-10-CM | POA: Diagnosis present

## 2017-04-07 DIAGNOSIS — F419 Anxiety disorder, unspecified: Secondary | ICD-10-CM | POA: Diagnosis present

## 2017-04-07 DIAGNOSIS — E559 Vitamin D deficiency, unspecified: Secondary | ICD-10-CM | POA: Diagnosis present

## 2017-04-07 DIAGNOSIS — M545 Low back pain: Secondary | ICD-10-CM | POA: Diagnosis present

## 2017-04-07 DIAGNOSIS — Z888 Allergy status to other drugs, medicaments and biological substances status: Secondary | ICD-10-CM

## 2017-04-07 DIAGNOSIS — M5136 Other intervertebral disc degeneration, lumbar region: Secondary | ICD-10-CM | POA: Diagnosis present

## 2017-04-07 DIAGNOSIS — E039 Hypothyroidism, unspecified: Secondary | ICD-10-CM | POA: Diagnosis present

## 2017-04-07 DIAGNOSIS — Z419 Encounter for procedure for purposes other than remedying health state, unspecified: Secondary | ICD-10-CM

## 2017-04-07 DIAGNOSIS — Z886 Allergy status to analgesic agent status: Secondary | ICD-10-CM

## 2017-04-07 HISTORY — PX: ANTERIOR LAT LUMBAR FUSION: SHX1168

## 2017-04-07 SURGERY — ANTERIOR LATERAL LUMBAR FUSION 1 LEVEL
Anesthesia: General | Site: Spine Lumbar

## 2017-04-07 MED ORDER — MIDAZOLAM HCL 2 MG/2ML IJ SOLN
INTRAMUSCULAR | Status: AC
  Filled 2017-04-07: qty 2

## 2017-04-07 MED ORDER — FENTANYL CITRATE (PF) 100 MCG/2ML IJ SOLN
25.0000 ug | INTRAMUSCULAR | Status: DC | PRN
  Administered 2017-04-07: 50 ug via INTRAVENOUS
  Administered 2017-04-07 (×2): 25 ug via INTRAVENOUS

## 2017-04-07 MED ORDER — ALPRAZOLAM 0.5 MG PO TABS
1.0000 mg | ORAL_TABLET | Freq: Every day | ORAL | Status: DC
  Administered 2017-04-07: 1 mg via ORAL
  Filled 2017-04-07: qty 2

## 2017-04-07 MED ORDER — FENTANYL CITRATE (PF) 250 MCG/5ML IJ SOLN
INTRAMUSCULAR | Status: AC
  Filled 2017-04-07: qty 5

## 2017-04-07 MED ORDER — MUPIROCIN 2 % EX OINT
1.0000 "application " | TOPICAL_OINTMENT | Freq: Two times a day (BID) | CUTANEOUS | Status: DC
  Administered 2017-04-07: 1 via TOPICAL

## 2017-04-07 MED ORDER — MENTHOL 3 MG MT LOZG: 1.0000 | LOZENGE | OROMUCOSAL | Status: DC | PRN

## 2017-04-07 MED ORDER — THROMBIN 5000 UNITS EX SOLR
CUTANEOUS | Status: DC | PRN
  Administered 2017-04-07 (×2): 5000 [IU] via TOPICAL

## 2017-04-07 MED ORDER — BUPIVACAINE HCL (PF) 0.25 % IJ SOLN
INTRAMUSCULAR | Status: DC | PRN
  Administered 2017-04-07: 7 mL

## 2017-04-07 MED ORDER — MORPHINE SULFATE (PF) 2 MG/ML IV SOLN
2.0000 mg | INTRAVENOUS | Status: DC | PRN
  Administered 2017-04-07 – 2017-04-08 (×3): 2 mg via INTRAVENOUS
  Filled 2017-04-07 (×3): qty 1

## 2017-04-07 MED ORDER — PROPOFOL 10 MG/ML IV BOLUS
INTRAVENOUS | Status: DC | PRN
  Administered 2017-04-07: 100 mg via INTRAVENOUS

## 2017-04-07 MED ORDER — PROPOFOL 10 MG/ML IV BOLUS
INTRAVENOUS | Status: AC
  Filled 2017-04-07: qty 20

## 2017-04-07 MED ORDER — ACETAMINOPHEN 325 MG PO TABS
650.0000 mg | ORAL_TABLET | ORAL | Status: DC | PRN
  Administered 2017-04-07: 650 mg via ORAL
  Filled 2017-04-07: qty 2

## 2017-04-07 MED ORDER — MUPIROCIN 2 % EX OINT
TOPICAL_OINTMENT | CUTANEOUS | Status: AC
  Filled 2017-04-07: qty 22

## 2017-04-07 MED ORDER — ONDANSETRON HCL 4 MG/2ML IJ SOLN
INTRAMUSCULAR | Status: AC
  Filled 2017-04-07: qty 2

## 2017-04-07 MED ORDER — HEMOSTATIC AGENTS (NO CHARGE) OPTIME
TOPICAL | Status: DC | PRN
  Administered 2017-04-07: 1 via TOPICAL

## 2017-04-07 MED ORDER — SENNA 8.6 MG PO TABS
1.0000 | ORAL_TABLET | Freq: Two times a day (BID) | ORAL | Status: DC
  Administered 2017-04-07 – 2017-04-08 (×2): 8.6 mg via ORAL
  Filled 2017-04-07 (×2): qty 1

## 2017-04-07 MED ORDER — PROPOFOL 500 MG/50ML IV EMUL
INTRAVENOUS | Status: DC | PRN
  Administered 2017-04-07: 50 ug/kg/min via INTRAVENOUS

## 2017-04-07 MED ORDER — ESTRADIOL 1 MG PO TABS
1.0000 mg | ORAL_TABLET | Freq: Every day | ORAL | Status: DC
  Administered 2017-04-08: 1 mg via ORAL
  Filled 2017-04-07: qty 1

## 2017-04-07 MED ORDER — PHENYLEPHRINE HCL 10 MG/ML IJ SOLN
INTRAVENOUS | Status: DC | PRN
  Administered 2017-04-07: 25 ug/min via INTRAVENOUS

## 2017-04-07 MED ORDER — ACETAMINOPHEN 650 MG RE SUPP: 650.0000 mg | RECTAL | Status: DC | PRN

## 2017-04-07 MED ORDER — ONDANSETRON HCL 4 MG/2ML IJ SOLN
INTRAMUSCULAR | Status: DC | PRN
  Administered 2017-04-07: 4 mg via INTRAVENOUS

## 2017-04-07 MED ORDER — OXYCODONE HCL 5 MG PO TABS
5.0000 mg | ORAL_TABLET | ORAL | Status: DC | PRN
  Administered 2017-04-07 – 2017-04-08 (×4): 10 mg via ORAL
  Filled 2017-04-07 (×6): qty 2

## 2017-04-07 MED ORDER — SODIUM CHLORIDE 0.9% FLUSH: 3.0000 mL | INTRAVENOUS | Status: DC | PRN

## 2017-04-07 MED ORDER — SODIUM CHLORIDE 0.9% FLUSH: 3.0000 mL | Freq: Two times a day (BID) | INTRAVENOUS | Status: DC

## 2017-04-07 MED ORDER — PHENYLEPHRINE HCL 10 MG/ML IJ SOLN
INTRAMUSCULAR | Status: DC | PRN
  Administered 2017-04-07: 40 ug via INTRAVENOUS
  Administered 2017-04-07 (×3): 80 ug via INTRAVENOUS
  Administered 2017-04-07: 40 ug via INTRAVENOUS
  Administered 2017-04-07: 80 ug via INTRAVENOUS

## 2017-04-07 MED ORDER — CYCLOBENZAPRINE HCL 10 MG PO TABS
ORAL_TABLET | ORAL | Status: AC
  Filled 2017-04-07: qty 1

## 2017-04-07 MED ORDER — CEFAZOLIN SODIUM-DEXTROSE 2-4 GM/100ML-% IV SOLN
2.0000 g | Freq: Three times a day (TID) | INTRAVENOUS | Status: AC
  Administered 2017-04-07 – 2017-04-08 (×2): 2 g via INTRAVENOUS
  Filled 2017-04-07 (×2): qty 100

## 2017-04-07 MED ORDER — PHENYLEPHRINE 40 MCG/ML (10ML) SYRINGE FOR IV PUSH (FOR BLOOD PRESSURE SUPPORT)
PREFILLED_SYRINGE | INTRAVENOUS | Status: AC
  Filled 2017-04-07: qty 10

## 2017-04-07 MED ORDER — VITAMIN D 1000 UNITS PO TABS
3000.0000 [IU] | ORAL_TABLET | Freq: Every day | ORAL | Status: DC
  Administered 2017-04-08: 3000 [IU] via ORAL
  Filled 2017-04-07: qty 3

## 2017-04-07 MED ORDER — LEVOTHYROXINE SODIUM 75 MCG PO TABS
75.0000 ug | ORAL_TABLET | Freq: Every day | ORAL | Status: DC
  Administered 2017-04-08: 75 ug via ORAL
  Filled 2017-04-07: qty 1

## 2017-04-07 MED ORDER — ROCURONIUM BROMIDE 50 MG/5ML IV SOLN
INTRAVENOUS | Status: AC
  Filled 2017-04-07: qty 1

## 2017-04-07 MED ORDER — THROMBIN 5000 UNITS EX SOLR
OROMUCOSAL | Status: DC | PRN
  Administered 2017-04-07: 5 mL via TOPICAL

## 2017-04-07 MED ORDER — FENTANYL CITRATE (PF) 100 MCG/2ML IJ SOLN
INTRAMUSCULAR | Status: AC
  Administered 2017-04-07: 50 ug via INTRAVENOUS
  Filled 2017-04-07: qty 2

## 2017-04-07 MED ORDER — LIDOCAINE HCL (CARDIAC) 20 MG/ML IV SOLN
INTRAVENOUS | Status: DC | PRN
  Administered 2017-04-07: 100 mg via INTRAVENOUS

## 2017-04-07 MED ORDER — ONDANSETRON HCL 4 MG PO TABS: 4.0000 mg | ORAL_TABLET | Freq: Four times a day (QID) | ORAL | Status: DC | PRN

## 2017-04-07 MED ORDER — SODIUM CHLORIDE 0.9 % IR SOLN
Status: DC | PRN
  Administered 2017-04-07: 500 mL

## 2017-04-07 MED ORDER — SUCCINYLCHOLINE CHLORIDE 20 MG/ML IJ SOLN
INTRAMUSCULAR | Status: DC | PRN
  Administered 2017-04-07: 130 mg via INTRAVENOUS

## 2017-04-07 MED ORDER — BUPIVACAINE HCL (PF) 0.25 % IJ SOLN
INTRAMUSCULAR | Status: AC
  Filled 2017-04-07: qty 30

## 2017-04-07 MED ORDER — THROMBIN 5000 UNITS EX SOLR
CUTANEOUS | Status: AC
  Filled 2017-04-07: qty 5000

## 2017-04-07 MED ORDER — 0.9 % SODIUM CHLORIDE (POUR BTL) OPTIME
TOPICAL | Status: DC | PRN
  Administered 2017-04-07: 1000 mL

## 2017-04-07 MED ORDER — LACTATED RINGERS IV SOLN
INTRAVENOUS | Status: DC | PRN
  Administered 2017-04-07 (×2): via INTRAVENOUS

## 2017-04-07 MED ORDER — PHENOL 1.4 % MT LIQD: 1.0000 | OROMUCOSAL | Status: DC | PRN

## 2017-04-07 MED ORDER — POTASSIUM CHLORIDE IN NACL 20-0.9 MEQ/L-% IV SOLN
INTRAVENOUS | Status: DC
  Administered 2017-04-07 – 2017-04-08 (×2): via INTRAVENOUS
  Filled 2017-04-07 (×2): qty 1000

## 2017-04-07 MED ORDER — THROMBIN 5000 UNITS EX SOLR
CUTANEOUS | Status: AC
  Filled 2017-04-07: qty 10000

## 2017-04-07 MED ORDER — MIDAZOLAM HCL 5 MG/5ML IJ SOLN
INTRAMUSCULAR | Status: DC | PRN
  Administered 2017-04-07: 2 mg via INTRAVENOUS

## 2017-04-07 MED ORDER — SODIUM CHLORIDE 0.9 % IV SOLN: 250.0000 mL | INTRAVENOUS | Status: DC

## 2017-04-07 MED ORDER — FENTANYL CITRATE (PF) 100 MCG/2ML IJ SOLN
INTRAMUSCULAR | Status: DC | PRN
  Administered 2017-04-07: 100 ug via INTRAVENOUS
  Administered 2017-04-07 (×2): 50 ug via INTRAVENOUS

## 2017-04-07 MED ORDER — ONDANSETRON HCL 4 MG/2ML IJ SOLN: 4.0000 mg | Freq: Four times a day (QID) | INTRAMUSCULAR | Status: DC | PRN

## 2017-04-07 MED ORDER — CYCLOBENZAPRINE HCL 5 MG PO TABS
5.0000 mg | ORAL_TABLET | Freq: Three times a day (TID) | ORAL | Status: DC | PRN
  Administered 2017-04-07 – 2017-04-08 (×3): 5 mg via ORAL
  Filled 2017-04-07 (×3): qty 1

## 2017-04-07 SURGICAL SUPPLY — 64 items
BAG DECANTER FOR FLEXI CONT (MISCELLANEOUS) ×4 IMPLANT
BENZOIN TINCTURE PRP APPL 2/3 (GAUZE/BANDAGES/DRESSINGS) ×4 IMPLANT
BLADE CLIPPER SURG (BLADE) IMPLANT
BOLT DECADE 5.5X40 (Bolt) ×6 IMPLANT
BOLT DECADE 5.5X40MM (Bolt) ×2 IMPLANT
BONE MATRIX OSTEOCEL PRO MED (Bone Implant) ×4 IMPLANT
CAGE MODULUS XL 8X18X45 - 10 (Cage) ×4 IMPLANT
CARTRIDGE OIL MAESTRO DRILL (MISCELLANEOUS) IMPLANT
CLOSURE WOUND 1/2 X4 (GAUZE/BANDAGES/DRESSINGS) ×1
CONT SPEC 4OZ CLIKSEAL STRL BL (MISCELLANEOUS) ×4 IMPLANT
COVER BACK TABLE 24X17X13 BIG (DRAPES) IMPLANT
COVER BACK TABLE 60X90IN (DRAPES) ×4 IMPLANT
DERMABOND ADVANCED (GAUZE/BANDAGES/DRESSINGS) ×4
DERMABOND ADVANCED .7 DNX12 (GAUZE/BANDAGES/DRESSINGS) ×4 IMPLANT
DIFFUSER DRILL AIR PNEUMATIC (MISCELLANEOUS) IMPLANT
DRAPE C-ARM 42X72 X-RAY (DRAPES) ×4 IMPLANT
DRAPE C-ARMOR (DRAPES) ×4 IMPLANT
DRAPE LAPAROTOMY 100X72X124 (DRAPES) ×4 IMPLANT
DRAPE POUCH INSTRU U-SHP 10X18 (DRAPES) ×4 IMPLANT
DRAPE SURG 17X23 STRL (DRAPES) ×16 IMPLANT
DRSG OPSITE POSTOP 3X4 (GAUZE/BANDAGES/DRESSINGS) ×8 IMPLANT
DURAPREP 26ML APPLICATOR (WOUND CARE) ×4 IMPLANT
ELECT BLADE 4.0 EZ CLEAN MEGAD (MISCELLANEOUS)
ELECT REM PT RETURN 9FT ADLT (ELECTROSURGICAL) ×8
ELECTRODE BLDE 4.0 EZ CLN MEGD (MISCELLANEOUS) IMPLANT
ELECTRODE REM PT RTRN 9FT ADLT (ELECTROSURGICAL) ×4 IMPLANT
EVACUATOR 1/8 PVC DRAIN (DRAIN) IMPLANT
GAUZE SPONGE 4X4 12PLY STRL (GAUZE/BANDAGES/DRESSINGS) IMPLANT
GAUZE SPONGE 4X4 16PLY XRAY LF (GAUZE/BANDAGES/DRESSINGS) IMPLANT
GLOVE BIO SURGEON STRL SZ7 (GLOVE) ×8 IMPLANT
GLOVE BIO SURGEON STRL SZ8 (GLOVE) ×8 IMPLANT
GLOVE BIOGEL PI IND STRL 7.0 (GLOVE) IMPLANT
GLOVE BIOGEL PI INDICATOR 7.0 (GLOVE)
GOWN STRL REUS W/ TWL LRG LVL3 (GOWN DISPOSABLE) ×4 IMPLANT
GOWN STRL REUS W/ TWL XL LVL3 (GOWN DISPOSABLE) ×2 IMPLANT
GOWN STRL REUS W/TWL 2XL LVL3 (GOWN DISPOSABLE) IMPLANT
GOWN STRL REUS W/TWL LRG LVL3 (GOWN DISPOSABLE) ×4
GOWN STRL REUS W/TWL XL LVL3 (GOWN DISPOSABLE) ×2
HEMOSTAT POWDER KIT SURGIFOAM (HEMOSTASIS) ×4 IMPLANT
KIT BASIN OR (CUSTOM PROCEDURE TRAY) ×8 IMPLANT
KIT DILATOR XLIF 5 (KITS) ×2 IMPLANT
KIT ROOM TURNOVER OR (KITS) ×4 IMPLANT
KIT SURGICAL ACCESS MAXCESS 4 (KITS) ×4 IMPLANT
KIT XLIF (KITS) ×2
MODULE NVM5 NEXT GEN EMG (NEEDLE) ×4 IMPLANT
NEEDLE HYPO 22GX1.5 SAFETY (NEEDLE) ×4 IMPLANT
NEEDLE HYPO 25X1 1.5 SAFETY (NEEDLE) ×4 IMPLANT
NS IRRIG 1000ML POUR BTL (IV SOLUTION) ×4 IMPLANT
OIL CARTRIDGE MAESTRO DRILL (MISCELLANEOUS)
PACK LAMINECTOMY NEURO (CUSTOM PROCEDURE TRAY) ×4 IMPLANT
PLATE 2H DECADE 8MM (Plate) ×4 IMPLANT
SPONGE LAP 4X18 X RAY DECT (DISPOSABLE) IMPLANT
SPONGE SURGIFOAM ABS GEL SZ50 (HEMOSTASIS) ×4 IMPLANT
STAPLER SKIN PROX WIDE 3.9 (STAPLE) IMPLANT
STRIP CLOSURE SKIN 1/2X4 (GAUZE/BANDAGES/DRESSINGS) ×3 IMPLANT
SUT VIC AB 0 CT1 18XCR BRD8 (SUTURE) ×2 IMPLANT
SUT VIC AB 0 CT1 8-18 (SUTURE) ×2
SUT VIC AB 2-0 CP2 18 (SUTURE) ×4 IMPLANT
SUT VIC AB 3-0 SH 8-18 (SUTURE) ×8 IMPLANT
SWABSTICK BENZOIN STERILE (MISCELLANEOUS) ×4 IMPLANT
TOWEL GREEN STERILE (TOWEL DISPOSABLE) ×4 IMPLANT
TOWEL GREEN STERILE FF (TOWEL DISPOSABLE) ×4 IMPLANT
TRAY FOLEY W/METER SILVER 16FR (SET/KITS/TRAYS/PACK) ×4 IMPLANT
WATER STERILE IRR 1000ML POUR (IV SOLUTION) ×4 IMPLANT

## 2017-04-07 NOTE — Progress Notes (Signed)
Patient arrived to floor. Reports pain of 10 out of 10. Pain med administered as ordered. Will continue to monitor.

## 2017-04-07 NOTE — Anesthesia Procedure Notes (Signed)
Procedure Name: Intubation Date/Time: 04/07/2017 7:36 AM Performed by: Jenne Campus Pre-anesthesia Checklist: Patient identified, Emergency Drugs available, Suction available and Patient being monitored Patient Re-evaluated:Patient Re-evaluated prior to induction Oxygen Delivery Method: Circle System Utilized Preoxygenation: Pre-oxygenation with 100% oxygen Induction Type: IV induction Ventilation: Mask ventilation without difficulty Laryngoscope Size: Miller and 2 Grade View: Grade II Tube type: Oral Tube size: 7.0 mm Number of attempts: 1 Airway Equipment and Method: Stylet and Oral airway Placement Confirmation: ETT inserted through vocal cords under direct vision,  positive ETCO2 and breath sounds checked- equal and bilateral Secured at: 21 cm Tube secured with: Tape Dental Injury: Teeth and Oropharynx as per pre-operative assessment

## 2017-04-07 NOTE — H&P (Signed)
Subjective: Patient is a 66 y.o. female admitted for XLIF L1-2. Onset of symptoms was several months ago, gradually worsening since that time.  The pain is rated severe, and is located at the across the lower back and radiates to legs. The pain is described as aching and occurs all day. The symptoms have been progressive. Symptoms are exacerbated by exercise. MRI or CT showed DDD/ stenosis L1-2 adjacent to previous L2-4 fusion.   Past Medical History:  Diagnosis Date  . Anemia   . Anxiety   . Arthritis   . Back pain, chronic   . Depression   . GERD (gastroesophageal reflux disease)   . Hyperlipidemia   . Hypothyroidism   . Kidney stone    2015  . Thyroid disease   . Umbilical hernia   . Unspecified vitamin D deficiency     Past Surgical History:  Procedure Laterality Date  . ABDOMINAL HYSTERECTOMY    . APPENDECTOMY    . CESAREAN SECTION    . HERNIA REPAIR    . LAPAROSCOPIC LYSIS INTESTINAL ADHESIONS    . MAXIMUM ACCESS (MAS)POSTERIOR LUMBAR INTERBODY FUSION (PLIF) 1 LEVEL N/A 04/16/2015   Procedure: masPLIF  - L2-L3 ;  Surgeon: Eustace Moore, MD;  Location: [facility];  Service: Neurosurgery;  Laterality: N/A;  masPLIF  - L2-L3   . SPINE SURGERY     lumbar  . TONSILLECTOMY AND ADENOIDECTOMY      Prior to Admission medications   Medication Sig Start Date End Date Taking? Authorizing Provider  ALPRAZolam Duanne Moron) 1 MG tablet Take 1 tablet (1 mg total) by mouth 3 (three) times daily. Patient taking differently: Take 1 mg by mouth at bedtime. May take an additional 1mg  daily as needed anxiety 02/01/17  Yes Vicie Mutters, PA-C  cholecalciferol (VITAMIN D) 1000 UNITS tablet Take 3,000 Units by mouth daily.    Yes [provider]  Cyanocobalamin (B-12 PO) Take 1 tablet by mouth daily.   Yes [provider]  cyclobenzaprine (FLEXERIL) 5 MG tablet Take 5 mg by mouth 3 (three) times daily as needed for muscle spasms.   Yes [provider]  estradiol (ESTRACE)  0.5 MG tablet Take 1 mg by mouth daily.    Yes [provider]  lidocaine (LIDODERM) 5 % Apply 1 patch for 6 hours on then 6 hours off daily 02/13/15  Yes [provider]  Magnesium 500 MG CAPS Take 1,000 mg by mouth daily with lunch.    Yes [provider]  SYNTHROID 75 MCG tablet Take 33mcg by mouth daily 01/17/17  Yes [provider]  traMADol (ULTRAM) 50 MG tablet Take 50 - 100 mgs by mouth at night as needed for pain 01/23/17  Yes [provider]  azelastine (ASTELIN) 0.1 % nasal spray Place 2 sprays into both nostrils 2 (two) times daily. Use in each nostril as directed Patient not taking: Reported on 03/27/2017 11/30/16 11/30/17  Forcucci, Loma Sousa, PA-C  ciprofloxacin (CIPRO) 500 MG tablet Take 1 tablet (500 mg total) by mouth 2 (two) times daily. 7 days Patient not taking: Reported on 03/27/2017 01/20/17   Vicie Mutters, PA-C  fluticasone [facility]) 50 MCG/ACT nasal spray Place 2 sprays into both nostrils daily. Patient not taking: Reported on 03/27/2017 11/30/16   Starlyn Skeans, PA-C  HYDROcodone-acetaminophen (NORCO) 5-325 MG tablet Take 1 tablet by mouth every 6 (six) hours as needed for moderate pain. Patient not taking: Reported on 03/27/2017 04/04/16   Starlyn Skeans, PA-C  predniSONE (DELTASONE)  20 MG tablet 1 tab 3 x day for 2 days, then 1 tab 2 x day for 2 days, then 1 tab 1 x day for 3 days Patient not taking: Reported on 03/27/2017 03/03/17   [name], MD   Allergies  Allergen Reactions  . Celebrex [Celecoxib]     "panic attack"  . Other     Skin bruises very easily and peels back  . Topamax [Topiramate]     MEMORY  . Entex Lq [Phenylephrine-Guaifenesin] Rash  . Fiorinal [Butalbital-Aspirin-Caffeine] Rash    Social History  Substance Use Topics  . Smoking status: Never Smoker  . Smokeless tobacco: Never Used  . Alcohol use No    Family History  Problem Relation Age of Onset  . Heart disease Mother   . Hyperlipidemia  Mother   . Thyroid disease Mother   . Asthma Sister   . Cancer Maternal Grandmother        ovarian     Review of Systems  Positive ROS: neg  All other systems have been reviewed and were otherwise negative with the exception of those mentioned in the HPI and as above.  Objective: Vital signs in last 24 hours: Temp:  [97.5 F (36.4 C)] 97.5 F (36.4 C) (07/20 0626) Pulse Rate:  [67] 67 (07/20 0626) Resp:  [16] 16 (07/20 0626) BP: (103)/(39) 103/39 (07/20 0626) SpO2:  [100 %] 100 % (07/20 0626)  General Appearance: Alert, cooperative, no distress, appears stated age Head: Normocephalic, without obvious abnormality, atraumatic Eyes: PERRL, conjunctiva/corneas clear, EOM's intact    Neck: Supple, symmetrical, trachea midline Back: Symmetric, no curvature, ROM normal, no CVA tenderness Lungs:  respirations unlabored Heart: Regular rate and rhythm Abdomen: Soft, non-tender Extremities: Extremities normal, atraumatic, no cyanosis or edema Pulses: 2+ and symmetric all extremities Skin: Skin color, texture, turgor normal, no rashes or lesions  NEUROLOGIC:   Mental status: Alert and oriented x4,  no aphasia, good attention span, fund of knowledge, and memory Motor Exam - grossly normal Sensory Exam - grossly normal Reflexes: 1+ Coordination - grossly normal Gait - grossly normal Balance - grossly normal Cranial Nerves: I: smell Not tested  II: visual acuity  OS: nl    OD: nl  II: visual fields Full to confrontation  II: pupils Equal, round, reactive to light  III,VII: ptosis None  III,IV,VI: extraocular muscles  Full ROM  V: mastication Normal  V: facial light touch sensation  Normal  V,VII: corneal reflex  Present  VII: facial muscle function - upper  Normal  VII: facial muscle function - lower Normal  VIII: hearing Not tested  IX: soft palate elevation  Normal  IX,X: gag reflex Present  XI: trapezius strength  5/5  XI: sternocleidomastoid strength 5/5  XI: neck  flexion strength  5/5  XII: tongue strength  Normal    Data Review Lab Results  Component Value Date   WBC 4.1 03/29/2017   HGB 12.5 03/29/2017   HCT 37.8 03/29/2017   MCV 90.6 03/29/2017   PLT 307 03/29/2017   Lab Results  Component Value Date   NA 137 03/29/2017   K 4.2 03/29/2017   CL 106 03/29/2017   CO2 25 03/29/2017   BUN 14 03/29/2017   CREATININE 0.65 03/29/2017   GLUCOSE 95 03/29/2017   Lab Results  Component Value Date   INR 1.01 03/29/2017    Assessment/Plan: Patient admitted for XLIF L1-2 with lateral plate vs W41-L2 screws. Patient has failed a reasonable attempt at conservative  therapy.  I explained the condition and procedure to the patient and answered any questions.  Patient wishes to proceed with procedure as planned. Understands risks/ benefits and typical outcomes of procedure.   Shereen Marton S 04/07/2017 7:21 AM

## 2017-04-07 NOTE — Progress Notes (Signed)
Orthopedic Tech Progress Note Patient Details:  Monique Thomas Jun 27, 1951 [id number]  Patient ID: Monique Thomas, female   DOB: 11-Sep-1951, 65 y.o.   MRN: [id number]   Monique Thomas 04/07/2017, 12:20 PMCalled [employer] for TLSO brace.

## 2017-04-07 NOTE — Evaluation (Signed)
Physical Therapy Evaluation Patient Details Name: Monique Thomas Player MRN: [id number] DOB: [birth year] Today's Date: 04/07/2017   History of Present Illness  Pt is a 66 y/o female s/p XLIF L1-L2. PMH including but not limited to HLD and prior back surgery in 2016.  Clinical Impression  Pt presented supine in bed with HOB elevated, awake and willing to participate in therapy session. Prior to admission, pt reported that she was independent with all functional mobility and ADLs. Pt limited this session secondary to significant pain (reported 10/10); however, willing to perform sit<>stand transfers and take side steps at EOB. Pt would continue to benefit from skilled physical therapy services at this time while admitted and after d/c to address the below listed limitations in order to improve overall safety and independence with functional mobility.     Follow Up Recommendations Home health PT;Supervision/Assistance - 24 hour    Equipment Recommendations  None recommended by PT    Recommendations for Other Services       Precautions / Restrictions Precautions Precautions: Fall;Back Required Braces or Orthoses: Spinal Brace Spinal Brace: Lumbar corset;Applied in sitting position Restrictions Weight Bearing Restrictions: No      Mobility  Bed Mobility Overal bed mobility: Needs Assistance Bed Mobility: Rolling;Sidelying to Sit;Sit to Sidelying Rolling: Min guard Sidelying to sit: Min assist     Sit to sidelying: Min assist General bed mobility comments: increased time, good log roll technique, use of bed rails, assist with bilateral LEs and to elevate trunk to achieve sitting EOB  Transfers Overall transfer level: Needs assistance Equipment used: 1 person hand held assist Transfers: Sit to/from Stand Sit to Stand: Min assist         General transfer comment: increased time, min A to power into full standing from EOB and for stability  Ambulation/Gait              General Gait Details: pt able to take 3 lateral side steps towards her L towards head of bed; pt limited secondary to significant pain (10/10)  Stairs            Wheelchair Mobility    Modified Rankin (Stroke Patients Only)       Balance Overall balance assessment: Needs assistance Sitting-balance support: Feet supported Sitting balance-Leahy Scale: Fair     Standing balance support: During functional activity;Single extremity supported Standing balance-Leahy Scale: Poor Standing balance comment: pt reliant on at least one UE support for stability with static and dynamic                             Pertinent Vitals/Pain Pain Assessment: 0-10 Pain Score: 10-Worst pain ever Pain Location: back and L LE Pain Descriptors / Indicators: Sore;Grimacing;Guarding;Moaning Pain Intervention(s): Monitored during session;Repositioned    Home Living Family/patient expects to be discharged to:: Private residence Living Arrangements: Spouse/significant other;Children Available Help at Discharge: Family;Available 24 hours/day Type of Home: House Home Access: Stairs to enter Entrance Stairs-Rails: Psychiatric nurse of Steps: 7 Home Layout: One level Home Equipment: Walker - 2 wheels;Cane - single point;Bedside commode      Prior Function Level of Independence: Independent               Hand Dominance        Extremity/Trunk Assessment   Upper Extremity Assessment Upper Extremity Assessment: Overall WFL for tasks assessed    Lower Extremity Assessment Lower Extremity Assessment: Overall WFL for tasks assessed  Cervical / Trunk Assessment Cervical / Trunk Assessment: Other exceptions Cervical / Trunk Exceptions: pt is s/p lumbar sx  Communication   Communication: No difficulties  Cognition Arousal/Alertness: Awake/alert Behavior During Therapy: Anxious Overall Cognitive Status: Within Functional Limits for tasks assessed                                         General Comments      Exercises     Assessment/Plan    PT Assessment Patient needs continued PT services  PT Problem List Decreased strength;Decreased activity tolerance;Decreased balance;Decreased mobility;Decreased coordination;Decreased knowledge of use of DME;Decreased safety awareness;Decreased knowledge of precautions;Pain       PT Treatment Interventions Gait training;DME instruction;Stair training;Functional mobility training;Therapeutic activities;Balance training;Therapeutic exercise;Neuromuscular re-education;Patient/family education    PT Goals (Current goals can be found in the Care Plan section)  Acute Rehab PT Goals Patient Stated Goal: return home, decrease pain PT Goal Formulation: With patient/family Time For Goal Achievement: 04/21/17 Potential to Achieve Goals: Good    Frequency Min 5X/week   Barriers to discharge        Co-evaluation               AM-PAC PT "6 Clicks" Daily Activity  Outcome Measure Difficulty turning over in bed (including adjusting bedclothes, sheets and blankets)?: A Lot Difficulty moving from lying on back to sitting on the side of the bed? : Total Difficulty sitting down on and standing up from a chair with arms (e.g., wheelchair, bedside commode, etc,.)?: Total Help needed moving to and from a bed to chair (including a wheelchair)?: A Little Help needed walking in hospital room?: A Little Help needed climbing 3-5 steps with a railing? : A Lot 6 Click Score: 12    End of Session Equipment Utilized During Treatment: Back brace Activity Tolerance: Patient limited by pain Patient left: in bed;with call bell/phone within reach;with family/visitor present;with SCD's reapplied Nurse Communication: Mobility status PT Visit Diagnosis: Other abnormalities of gait and mobility (R26.89);Pain Pain - part of body:  (back)    Time: 7711-6579 PT Time Calculation (min) (ACUTE ONLY):  18 min   Charges:   PT Evaluation $PT Eval Moderate Complexity: 1 Procedure     PT G Codes:        Sherie Don, PT, DPT [place] 04/07/2017, 4:28 PM

## 2017-04-07 NOTE — Op Note (Signed)
04/07/2017  10:29 AM  PATIENT:  Monique Thomas  66 y.o. female  PRE-OPERATIVE DIAGNOSIS: Adjacent level spondylosis L1-2 with spinal stenosis, retrolisthesis, degenerative disc disease, back and leg pain  POST-OPERATIVE DIAGNOSIS:  same  PROCEDURE:  Anterolateral retroperitoneal interbody fusion L1-2  utilizing a nuvasive modulus cage packed with morcellized allograft, lateral L1-2 plating utilizing a nuvasive plate with 40 mm bicortical screws  SURGEON:  Sherley Bounds, MD  ASSISTANTS: Ditty MD  ANESTHESIA:   General  EBL: 25 ml  Total I/O In: 1100 [I.V.:1100] Out: 425 [Urine:400; Blood:25]  BLOOD ADMINISTERED: none  DRAINS: none  SPECIMEN:  none  INDICATION FOR PROCEDURE: This patient presented with severe back pain and leg pain. Imaging showed significant adjacent level breakdown at L1-2. The patient tried conservative measures without relief. Pain was debilitating. Recommended L1-2 anterolateral retroperitoneal interbody fusion. Patient understood the risks, benefits, and alternatives and potential outcomes and wished to proceed.  PROCEDURE DETAILS: The patient was brought to the operating room and after induction of adequate generalized endotracheal anesthesia, the patient was positioned in the left lateral decubitus position exposing the right side. The patient was positioned in the typical XLIF fashion and taped into position and trajectories were confirmed with AP lateral fluoroscopy. The skin was cleaned and then prepped with DuraPrep and draped in the usual sterile fashion. 5 cc of local anesthesia was injected. A lateral incision was made over the appropriate disc space and a incision was made posterior lateral to this. Blunt finger dissection was used to enter the retroperitoneal space. I could palpate the psoas musculature as well as the anterior face of the transverse process, and I could feel the fatty tissues of the retroperitoneum. I swept my finger up to the lateral  incision and passed my first dilator down between the 11th and 12th ribs to psoas musculature utilizing my index finger. We then used EMG monitoring to monitor our dilator. We checked our position with fluoroscopy and then placed a K wire into the collapsed disc space, and then used sequential dilators until our final retractor was in place. We then positioned it utilizing AP lateral fluoroscopy, and used our ball probe to make sure there were no neural structures within the working channel. I then placed the intradiscal shim, and opened my retractor further. The annulus was then incised and the discectomy was done with pituitaries. The annulus was removed from the endplates with a Cobb elevator and the opposite annulus was opened and released. I then used a series of scrapers and shavers to prepare the endplates, taking care not to violate the endplates. I then placed my 4 mm by 16 mm paddle across the disc space to further open opposite annulus and I checked the trajectory with AP and lateral fluoroscopy. I then did the same with the 6 mm paddle. I then used a 6 mm cutting chisel. I then used sequential trials to determine the correct size cage. The 8 mm lordotic trial fit the best, so a corresponding titanium modulus cage was selected and packed with morcellized allograft. Using sliders it was then tapped gently into the disc space utilizing AP fluoroscopy until it was appropriately positioned. I then irrigated with saline solution containing bacitracin and dried any bleeding points. I checked my final cage position with AP lateral fluoroscopy. I then opened the retractor somewhat wider and used a 2 holed plate. I checked this position with AP and lateral fluoroscopy. I then used the awl to drill to a depth of 40  mm utilizing AP fluoroscopy. I then placed 240 mm screws into the 2 holed plate and sequentially tightened them. I then locked them into position, unbroken the table, and then locked the plate into  position. I then checked my final construct with AP and lateral fluoroscopy. I removed the retractor as well as the shim watching the removal directly.  the wounds were then closed in layers of 0 Vicryl in the fascia, 2-0 Vicryl in the subcutaneous tissues, and 3-0 Vicryl in the subcuticular tissues. The skin was closed with Dermabond A sterile dressing was applied. The patient was then awakened from general anesthesia and transported to the recovery room in stable condition. At the end of the procedure all sponge, needle, and instrument counts were correct.    PLAN OF CARE: Admit to inpatient   PATIENT DISPOSITION:  PACU - hemodynamically stable.   Delay start of Pharmacological VTE agent (>24hrs) due to surgical blood loss or risk of bleeding:  yes

## 2017-04-07 NOTE — Transfer of Care (Signed)
Immediate Anesthesia Transfer of Care Note  Patient: Monique Thomas  Procedure(s) Performed: Procedure(s) with comments: LUMBAR ONE-TWO ANTERIOR LATERAL LUMBAR FUSION WITH LATERAL PLATE (N/A) - Right side approach  Patient Location: PACU  Anesthesia Type:General  Level of Consciousness: oriented, sedated and patient cooperative  Airway & Oxygen Therapy: Patient Spontanous Breathing and Patient connected to face mask oxygen  Post-op Assessment: Report given to RN and Post -op Vital signs reviewed and stable  Post vital signs: Reviewed  Last Vitals:  Vitals:   04/07/17 1034 04/07/17 1038  BP:  127/65  Pulse: 70 70  Resp: 15 15  Temp: (!) 36.3 C     Last Pain:  Vitals:   04/07/17 0626  TempSrc: Oral  PainSc:          Complications: No apparent anesthesia complications

## 2017-04-08 MED ORDER — CYCLOBENZAPRINE HCL 5 MG PO TABS: 5.0000 mg | ORAL_TABLET | Freq: Three times a day (TID) | ORAL | 0 refills | Status: DC | PRN

## 2017-04-08 MED ORDER — HYDROCODONE-ACETAMINOPHEN 5-325 MG PO TABS: 1.0000 | ORAL_TABLET | ORAL | 0 refills | Status: DC | PRN

## 2017-04-08 NOTE — Progress Notes (Signed)
Physical Therapy Treatment Patient Details Name: Monique Thomas Player MRN: [id number] DOB: 03/08/1951 Today's Date: 04/08/2017    History of Present Illness (P) Pt is a 66 y.o. female s/p XLIF L1-L2. PMH including but not limited to HLD, anemia, anxiety, arthritis, depression, hypothyroidism, and prior back surgery in 2016.    PT Comments    Pt is making great progress with mobility today with gait in hallways and stair education completed. Patient safe to D/C from a mobility standpoint based on progression towards goals set on PT eval.    Follow Up Recommendations  Home health PT;Supervision/Assistance - 24 hour     Equipment Recommendations  None recommended by PT       Precautions / Restrictions Precautions Precautions: (P) Fall;Back Precaution Booklet Issued: (P) No Precaution Comments: (P) reviewed back precautions  Required Braces or Orthoses: (P) Spinal Brace Spinal Brace: (P) Lumbar corset;Thoracolumbosacral orthotic;Applied in sitting position Restrictions Weight Bearing Restrictions: (P) No    Mobility  Bed Mobility               General bed mobility comments: not observed during session today  Transfers Overall transfer level: Needs assistance Equipment used: Rolling walker (2 wheeled) Transfers: Sit to/from Stand Sit to Stand: Supervision         General transfer comment: demo'd safe technique without cues  Ambulation/Gait Ambulation/Gait assistance: Supervision Ambulation Distance (Feet): 250 Feet Assistive device: Rolling walker (2 wheeled) Gait Pattern/deviations: Step-through pattern;Decreased stride length         Stairs Stairs: Yes   Stair Management: Two rails;Step to pattern;Forwards Number of Stairs: 10 General stair comments: cues on technique and sequencing with stairs       Cognition Arousal/Alertness: (P) Awake/alert Behavior During Therapy: (P) WFL for tasks assessed/performed Overall Cognitive Status: (P) Within Functional  Limits for tasks assessed             Pertinent Vitals/Pain Pain Assessment: (P) 0-10 Pain Score: (P) 8  Pain Location: (P) back Pain Descriptors / Indicators: (P) Stabbing Pain Intervention(s): (P) Monitored during session;Repositioned    Home Living Family/patient expects to be discharged to:: (P) Private residence Living Arrangements: (P) Spouse/significant other;Children Available Help at Discharge: (P) Family (available most of the time) Type of Home: (P) House Home Access: (P) Stairs to enter Entrance Stairs-Rails: (P) Right;Left Home Layout: (P) One level Home Equipment: (P) Walker - 2 wheels;Cane - single point;Bedside commode;Shower seat      Prior Function Level of Independence: (P) Independent          PT Goals (current goals can now be found in the care plan section) Acute Rehab PT Goals Patient Stated Goal: return home, decrease pain PT Goal Formulation: With patient/family Time For Goal Achievement: 04/21/17 Potential to Achieve Goals: Good Progress towards PT goals: Progressing toward goals    Frequency    Min 5X/week      PT Plan Current plan remains appropriate    AM-PAC PT "6 Clicks" Daily Activity  Outcome Measure  Difficulty turning over in bed (including adjusting bedclothes, sheets and blankets)?: A Little Difficulty moving from lying on back to sitting on the side of the bed? : A Little Difficulty sitting down on and standing up from a chair with arms (e.g., wheelchair, bedside commode, etc,.)?: A Little Help needed moving to and from a bed to chair (including a wheelchair)?: None Help needed walking in hospital room?: None Help needed climbing 3-5 steps with a railing? : None 6 Click Score: 21  End of Session Equipment Utilized During Treatment: Gait belt;Back brace Activity Tolerance: Patient tolerated treatment well Patient left: in chair;with call bell/phone within reach;with family/visitor present Nurse Communication:  Mobility status PT Visit Diagnosis: Unsteadiness on feet (R26.81);Other abnormalities of gait and mobility (R26.89)     Time: 0630-1601 PT Time Calculation (min) (ACUTE ONLY): 17 min  Charges:  $Gait Training: 8-22 mins                    Willow Ora, PTA, [facility] [employer] [phone] 04/08/17, 9:37 AM  Willow Ora 04/08/2017, 9:36 AM

## 2017-04-08 NOTE — Evaluation (Signed)
Occupational Therapy Evaluation Patient Details Name: Monique Thomas MRN: [id number] DOB: 12-21-1950 Today's Date: 04/08/2017    History of Present Illness Pt is a 66 y.o. female s/p XLIF L1-L2. PMH including but not limited to HLD, anemia, anxiety, arthritis, depression, hypothyroidism, and prior back surgery in 2016.   Clinical Impression   Pt independent with ADLs, PTA. Feel pt will benefit from acute OT to increase independence prior to d/c. Recommending HHOT upon d/c, but pt not wanting this. Will plan to continue to see pt while in acute setting.     Follow Up Recommendations  Home health OT;Supervision/Assistance - 24 hour    Equipment Recommendations  Other (comment) (AE if wanted)    Recommendations for Other Services       Precautions / Restrictions Precautions Precautions: Fall;Back Precaution Booklet Issued: No Precaution Comments: reviewed back precautions  Required Braces or Orthoses: Spinal Brace Spinal Brace: Lumbar corset;Thoracolumbosacral orthotic;Applied in sitting position Restrictions Weight Bearing Restrictions: No      Mobility Bed Mobility Overal bed mobility: Needs Assistance Bed Mobility: Rolling;Sidelying to Sit Rolling: Supervision Sidelying to sit: Mod assist       General bed mobility comments: assist to come from sidelying to sitting position.   Transfers Overall transfer level: Needs assistance Equipment used: Rolling walker (2 wheeled) Transfers: Sit to/from Stand Sit to Stand: Min guard            Balance    no LOB in session.                                       ADL either performed or assessed with clinical judgement   ADL Overall ADL's : Needs assistance/impaired     Grooming: Wash/dry hands;Set up;Supervision/safety;Standing           Upper Body Dressing : Moderate assistance;Sitting   Lower Body Dressing: Maximal assistance;Sit to/from stand   Toilet Transfer: Min guard;Ambulation;RW (3  in 1 over commode)   Toileting- Clothing Manipulation and Hygiene: Supervision/safety (sitting/standing) Toileting - Clothing Manipulation Details (indicate cue type and reason): pt appeared to be twisting while performing hygiene     Functional mobility during ADLs: Min guard;Rolling walker General ADL Comments: Educated on ways to avoid breaking back precautions. Educated on back brace. Discussed what pt could use for toilet aid.      Vision         Perception     Praxis      Pertinent Vitals/Pain Pain Assessment: 0-10 Pain Score: 8  Pain Location: back Pain Descriptors / Indicators: Stabbing Pain Intervention(s): Monitored during session;Repositioned     Hand Dominance     Extremity/Trunk Assessment Upper Extremity Assessment Upper Extremity Assessment: Overall WFL for tasks assessed   Lower Extremity Assessment Lower Extremity Assessment: Defer to PT evaluation       Communication Communication Communication: No difficulties   Cognition Arousal/Alertness: Awake/alert Behavior During Therapy: WFL for tasks assessed/performed Overall Cognitive Status: Within Functional Limits for tasks assessed                                     General Comments       Exercises     Shoulder Instructions      Home Living Family/patient expects to be discharged to:: Private residence Living Arrangements: Spouse/significant other;Children Available Help at Discharge:  Family (available most of the time) Type of Home: House Home Access: Stairs to enter [employer] of Steps: 7 Entrance Stairs-Rails: Right;Left Home Layout: One level     Bathroom Shower/Tub: Teacher, early years/pre:  (elevated toilet seat)     Home Equipment: Walker - 2 wheels;Cane - single point;Bedside commode;Shower seat          Prior Functioning/Environment Level of Independence: Independent                 OT Problem List: Decreased  strength;Decreased range of motion;Decreased activity tolerance;Decreased knowledge of use of DME or AE;Decreased knowledge of precautions;Pain      OT Treatment/Interventions: Self-care/ADL training;DME and/or AE instruction;Therapeutic activities;Balance training;Patient/family education    OT Goals(Current goals can be found in the care plan section) Acute Rehab OT Goals Patient Stated Goal: return home, decrease pain OT Goal Formulation: With patient Time For Goal Achievement: 04/15/17 Potential to Achieve Goals: Good ADL Goals Pt Will Perform Lower Body Dressing: with set-up;with supervision;with adaptive equipment;sit to/from stand Pt Will Transfer to Toilet: ambulating;with set-up;with supervision (3 in 1 over commode; with RW) Pt Will Perform Toileting - Clothing Manipulation and hygiene: sit to/from stand;with set-up;with adaptive equipment Pt Will Perform Tub/Shower Transfer: Tub transfer;with set-up;with supervision;ambulating;shower seat Additional ADL Goal #1: Pt will independently verbalize 3/3 back precautions and maintain during session.  Additional ADL Goal #2: Pt will don/doff back brace with setup assist.  OT Frequency: Min 2X/week   Barriers to D/C:            Co-evaluation              AM-PAC PT "6 Clicks" Daily Activity     Outcome Measure Help from another person eating meals?: None Help from another person taking care of personal grooming?: A Little Help from another person toileting, which includes using toliet, bedpan, or urinal?: A Little Help from another person bathing (including washing, rinsing, drying)?: A Lot Help from another person to put on and taking off regular upper body clothing?: A Lot Help from another person to put on and taking off regular lower body clothing?: A Lot 6 Click Score: 16   End of Session Equipment Utilized During Treatment: Gait belt;Rolling walker;Back brace Nurse Communication: Other (comment) (notified nurse tech  about putting chair alarm under pt)  Activity Tolerance: Patient tolerated treatment well Patient left: in chair;with family/visitor present  OT Visit Diagnosis: Pain Pain - Right/Left:  (back) Pain - part of body:  (back)                Time: 8938-1017 OT Time Calculation (min): 24 min Charges:  OT General Charges $OT Visit: 1 Procedure OT Evaluation $OT Eval Moderate Complexity: 1 Procedure G-Codes:     Benito Mccreedy OTR/L 04/08/2017, 9:39 AM

## 2017-04-08 NOTE — Progress Notes (Signed)
D/c instructions reviewed with patient and family. No further instructions at this time

## 2017-04-08 NOTE — Discharge Summary (Signed)
Physician Discharge Summary  Patient ID: Monique Thomas MRN: [id number] DOB/AGE: 66-20-1952 66 y.o.  Admit date: 04/07/2017 Discharge date: 04/08/2017  Admission Diagnoses:  Lumbar spinal stenosis  Discharge Diagnoses:  Same Active Problems:   S/P lumbar spinal fusion  Discharged Condition: Stable  Hospital Course:  Tamari Shillinglaw Thomas is a 66 y.o. female who was admitted for the below procedure. There were no post operative complications. At time of discharge, pain was well controlled, ambulating with PT, tolerating po, voiding normal. Ready for discharge. PT rec HH. Agree with this.  Treatments: Surgery - Anterolateral retroperitoneal interbody fusion L1-2  utilizing a nuvasive modulus cage packed with morcellized allograft, lateral L1-2 plating utilizing a nuvasive plate with 40 mm bicortical screws  Discharge Exam: Blood pressure (!) 109/47, pulse 85, temperature 98.8 F (37.1 C), temperature source Oral, resp. rate 20, SpO2 95 %. Awake, alert, oriented Speech fluent, appropriate CN grossly intact MAEW Wound c/d/i  Disposition: 01-Home or Self Care  Discharge Instructions    Call MD for:  difficulty breathing, headache or visual disturbances    Complete by:  As directed    Call MD for:  persistant dizziness or light-headedness    Complete by:  As directed    Call MD for:  redness, tenderness, or signs of infection (pain, swelling, redness, odor or green/yellow discharge around incision site)    Complete by:  As directed    Call MD for:  severe uncontrolled pain    Complete by:  As directed    Call MD for:  temperature >100.4    Complete by:  As directed    Diet general    Complete by:  As directed    Driving Restrictions    Complete by:  As directed    Do not drive until given clearance.   Increase activity slowly    Complete by:  As directed    Lifting restrictions    Complete by:  As directed    Do not lift anything >10lbs. Avoid bending and twisting in awkward  positions. Avoid bending at the back.   May shower / Bathe    Complete by:  As directed    In 24 hours. Okay to wash wound with warm soapy water. Avoid scrubbing the wound. Pat dry.     Allergies as of 04/08/2017      Reactions   Celebrex [celecoxib]    "panic attack"   Other    Skin bruises very easily and peels back   Topamax [topiramate]    MEMORY   Entex Lq [phenylephrine-guaifenesin] Rash   Fiorinal [butalbital-aspirin-caffeine] Rash      Medication List    STOP taking these medications   ciprofloxacin 500 MG tablet Commonly known as:  CIPRO   predniSONE 20 MG tablet Commonly known as:  DELTASONE     TAKE these medications   ALPRAZolam 1 MG tablet Commonly known as:  XANAX Take 1 tablet (1 mg total) by mouth 3 (three) times daily. What changed:  when to take this  additional instructions   azelastine 0.1 % nasal spray Commonly known as:  ASTELIN Place 2 sprays into both nostrils 2 (two) times daily. Use in each nostril as directed   B-12 PO Take 1 tablet by mouth daily.   cholecalciferol 1000 units tablet Commonly known as:  VITAMIN D Take 3,000 Units by mouth daily.   cyclobenzaprine 5 MG tablet Commonly known as:  FLEXERIL Take 1 tablet (5 mg total) by mouth 3 (three)  times daily as needed for muscle spasms.   estradiol 0.5 MG tablet Commonly known as:  ESTRACE Take 1 mg by mouth daily.   fluticasone 50 MCG/ACT nasal spray Commonly known as:  FLONASE Place 2 sprays into both nostrils daily.   HYDROcodone-acetaminophen 5-325 MG tablet Commonly known as:  NORCO Take 1 tablet by mouth every 4 (four) hours as needed for moderate pain. What changed:  when to take this   lidocaine 5 % Commonly known as:  LIDODERM Apply 1 patch for 6 hours on then 6 hours off daily   Magnesium 500 MG Caps Take 1,000 mg by mouth daily with lunch.   SYNTHROID 75 MCG tablet Generic drug:  levothyroxine Take by mouth daily   traMADol 50 MG tablet Commonly  known as:  ULTRAM Take 50 - 100 mgs by mouth at night as needed for pain            Durable Medical Equipment        Start     Ordered   04/07/17 1214  DME Walker rolling  Once    Question:  Patient needs a walker to treat with the following condition  Answer:  S/P lumbar fusion   04/07/17 1213   04/07/17 1214  DME 3 n 1  Once     04/07/17 1213     Follow-up Information    Marinell Blight, MD Follow up.   Specialty:  Neurology          Signed: Alyson Ingles 04/08/2017, 8:55 AM

## 2017-04-10 ENCOUNTER — Encounter (HOSPITAL_COMMUNITY): Payer: Self-pay | Admitting: Neurological Surgery

## 2017-04-10 NOTE — Anesthesia Postprocedure Evaluation (Signed)
Anesthesia Post Note  Patient: Trinaty Bundrick Player  Procedure(s) Performed: Procedure(s) (LRB): LUMBAR ONE-TWO ANTERIOR LATERAL LUMBAR FUSION WITH LATERAL PLATE (N/A)     Patient location during evaluation: PACU Anesthesia Type: General Level of consciousness: awake and alert Pain management: pain level controlled Vital Signs Assessment: post-procedure vital signs reviewed and stable Respiratory status: spontaneous breathing, nonlabored ventilation, respiratory function stable and patient connected to nasal cannula oxygen Cardiovascular status: blood pressure returned to baseline and stable Postop Assessment: no signs of nausea or vomiting Anesthetic complications: no    Last Vitals:  Vitals:   04/08/17 0533 04/08/17 0956  BP: (!) 109/47 (!) 111/45  Pulse: 85 93  Resp: 20   Temp: 37.1 C 36.7 C    Last Pain:  Vitals:   04/08/17 0956  TempSrc: Oral  PainSc:                  Riccardo Dubin

## 2017-04-28 ENCOUNTER — Other Ambulatory Visit: Payer: Self-pay | Admitting: Physician Assistant

## 2017-04-28 DIAGNOSIS — F419 Anxiety disorder, unspecified: Secondary | ICD-10-CM

## 2017-04-29 NOTE — Telephone Encounter (Signed)
Please call Alpraz  

## 2017-05-01 NOTE — Telephone Encounter (Signed)
Xanax called into pharmacy on 13th Aug 2018 at 8:22am by DD

## 2017-05-02 NOTE — Progress Notes (Signed)
COMPLETE PHYSICAL AND FOLLOW UP  Assessment and Plan:   Hypothyroidism, unspecified type Hypothyroidism-check TSH level, continue medications the same, reminded to take on an empty stomach 30-86mins before food.  -     TSH  Mixed hyperlipidemia -continue medications, check lipids, decrease fatty foods, increase activity.  -     Lipid panel  Vitamin D deficiency -     VITAMIN D 25 Hydroxy (Vit-D Deficiency, Fractures)  Medication management -     CBC with Differential/Platelet -     Hepatic function panel -     Magnesium  Anemia, unspecified type -     CBC with Differential/Platelet - Iron - Ferritin  Anxiety  Continue xanax PRN  Chronic back pain, unspecified back location, unspecified back pain laterality Continue follow up   Chronic pain syndrome Continue follow up   S/P lumbar spinal fusion Continue follow up  Can get Lidoderm patches OTC  Gastroesophageal reflux disease with esophagitis Continue PPI/H2 blocker, diet discussed  Need for prophylactic vaccination against Streptococcus pneumoniae (pneumococcus) -     Pneumococcal conjugate vaccine 13-valent IM  Screening for hematuria or proteinuria -     Urinalysis, Routine w reflex microscopic -     Microalbumin / creatinine urine ratio   Over 40 minutes of exam, counseling, chart review and critical decision making was performed Future Appointments Date Time Provider [place]  11/03/2017 10:30 AM Monique Thomas [facility] None  05/09/2018 10:00 AM Monique Mutters, PA-C [facility] None     Subjective:  Monique Thomas is a 66 y.o. female who presents for CPE and Follow up for depression, thyroid, chol.     Her blood pressure has been controlled at home, today their BP is BP: 118/66 She does not workout. She denies chest pain, shortness of breath, dizziness.  She is not on cholesterol medication and denies myalgias. Her cholesterol is at goal. The cholesterol last visit was:   Lab Results   Component Value Date   CHOL 203 (H) 10/31/2016   HDL 85 10/31/2016   LDLCALC 101 (H) 10/31/2016   TRIG 86 10/31/2016   CHOLHDL 2.4 10/31/2016   Lab Results  Component Value Date   HGBA1C 5.3 06/30/2016   Patient is on Vitamin D supplement, has not changed dose.   Lab Results  Component Value Date   VD25OH 41 10/31/2016     She is on thyroid medication. Her medication was changed last visit, she is now on 58mcg x 4 days only. Lab Results  Component Value Date   TSH 2.10 11/29/2016  .  BMI is Body mass index is 23.96 kg/m., she is working on diet and exercise. Wt Readings from Last 3 Encounters:  05/03/17 126 lb 12.8 oz (57.5 kg)  03/29/17 126 lb 4.8 oz (57.3 kg)  11/30/16 120 lb (54.4 kg)   She has chronic lower back pain, had surgery July 20 with Dr. Ronnald Thomas, doing well, followed by pain management. She has recurrent UTI's, follows urology and has hx of kidney stones.  She has depression, is on xanax PRN, declines SSRI medication.   Medication Review: Current Outpatient Prescriptions on File Prior to Visit  Medication Sig Dispense Refill  . ALPRAZolam (XANAX) 1 MG tablet Take 1/2 to 1 tablet at hour of sleep only if needed 90 tablet 0  . cholecalciferol (VITAMIN D) 1000 UNITS tablet Take 3,000 Units by mouth daily.     . Cyanocobalamin (B-12 PO) Take 1 tablet by mouth daily.    Marland Kitchen  cyclobenzaprine (FLEXERIL) 5 MG tablet Take 1 tablet (5 mg total) by mouth 3 (three) times daily as needed for muscle spasms. 60 tablet 0  . estradiol (ESTRACE) 0.5 MG tablet Take 1 mg by mouth daily.     Marland Kitchen lidocaine (LIDODERM) 5 % Apply 1 patch for 6 hours on then 6 hours off daily  2  . Magnesium 500 MG CAPS Take 1,000 mg by mouth daily with lunch.     . SYNTHROID 75 MCG tablet Take 84mcg by mouth daily  2   No current facility-administered medications on file prior to visit.     Allergies  Allergen Reactions  . Celebrex [Celecoxib]     "panic attack"  . Other     Skin bruises very  easily and peels back  . Topamax [Topiramate]     MEMORY  . Entex Lq [Phenylephrine-Guaifenesin] Rash  . Fiorinal [Butalbital-Aspirin-Caffeine] Rash    Current Problems (verified) Patient Active Problem List   Diagnosis Date Noted  . S/P lumbar spinal fusion 04/16/2015  . Chronic pain syndrome 03/27/2015  . Medication management 01/15/2014  . Prediabetes 01/15/2014  . Back pain, chronic   . Hyperlipidemia   . Hypothyroidism   . GERD (gastroesophageal reflux disease)   . Anemia   . Anxiety   . Vitamin D deficiency     Screening Tests Immunization History  Administered Date(s) Administered  . Influenza Split 06/29/2015  . Influenza Whole 05/21/2013  . Influenza, High Dose Seasonal PF 05/30/2016  . PPD Test 09/23/2013, 10/01/2014, 02/26/2016  . Pneumococcal Conjugate-13 05/03/2017  . Pneumococcal Polysaccharide-23 09/23/2013  . Td 09/19/2005  . Tdap 10/07/2015  . Zoster 03/27/2014   Preventative care: Last colonoscopy: 2008 due this year will get cologuard Last mammogram: 09/2016 [facility] Last pap smear/pelvic exam: 07/2015 GYN normal, Monique Thomas [id number]  Prior vaccinations: TD or Tdap: 2017  Influenza: 2017 Pneumococcal: 2015 Prevnar13: 2018 Shingles/Zostavax: 2015  Names of Other Physician/Practitioners you currently use: 1. Monique Thomas here for primary care 2. Dr. Theodis Thomas. Monique Thomas, eye doctor, last visit 08/2016 3. Dr. Toy Thomas, dentist, last visit q 6 months Patient Care Team: Monique Thomas as PCP - General (Internal Thomas) Monique Thomas as Referring Physician (Gastroenterology) Monique Thomas as Consulting Physician (Obstetrics and Gynecology) Monique Thomas as Consulting Physician (Urology) Monique Thomas as Physician Assistant (Orthopedic Surgery) Monique Thomas as Consulting Physician (Dermatology) Monique Thomas as Consulting Physician (Gastroenterology) Monique Thomas, Monique Thomas as Referring Physician (Optometry)  SURGICAL HISTORY She  has a past surgical history that includes Abdominal hysterectomy; Cesarean section; Hernia repair; Appendectomy; Tonsillectomy and adenoidectomy; Spine surgery; Laparoscopic lysis intestinal adhesions; Maximum access (mas)posterior lumbar interbody fusion (plif) 1 level (N/A, 04/16/2015); and Anterior lat lumbar fusion (N/A, 04/07/2017). FAMILY HISTORY Her family history includes Asthma in her sister; Cancer in her maternal grandmother; Heart disease in her mother; Hyperlipidemia in her mother; Thyroid disease in her mother. SOCIAL HISTORY She  reports that she has never smoked. She has never used smokeless tobacco. She reports that she does not drink alcohol or use drugs.  Review of Systems  Constitutional: Negative for chills, fever and malaise/fatigue.  HENT: Negative for congestion, ear pain and sore throat.   Eyes: Negative.   Respiratory: Negative for cough, shortness of breath and wheezing.   Cardiovascular: Negative for chest pain, palpitations and leg swelling.  Gastrointestinal: Negative for abdominal pain, blood in stool, constipation, diarrhea, heartburn and melena.  Genitourinary: Negative.  Musculoskeletal: Positive for back pain.  Skin: Negative.   Neurological: Negative for dizziness, sensory change, loss of consciousness and headaches.  Psychiatric/Behavioral: Negative for depression. The patient is not nervous/anxious and does not have insomnia.      Objective:     Today's Vitals   05/03/17 1014  BP: 118/66  Pulse: 83  Resp: 16  Temp: (!) 97.3 F (36.3 C)  SpO2: 99%  Weight: 126 lb 12.8 oz (57.5 kg)  Height: 5\' 1"  (1.549 m)   Body mass index is 23.96 kg/m.  General appearance: alert, no distress, WD/WN, female HEENT: normocephalic, sclerae anicteric, TMs pearly, nares patent, no discharge or erythema, pharynx normal Oral cavity: MMM, no lesions Neck: supple, no lymphadenopathy, no  thyromegaly, no masses Heart: RRR, normal S1, S2, no murmurs Lungs: CTA bilaterally, no wheezes, rhonchi, or rales Abdomen: +bs, soft, non tender, non distended, no masses, no hepatomegaly, no splenomegaly Musculoskeletal: nontender, no swelling, no obvious deformity Extremities: no edema, no cyanosis, no clubbing Pulses: 2+ symmetric, upper and lower extremities, normal cap refill Neurological: alert, oriented x 3, CN2-12 intact, strength normal upper extremities and lower extremities, sensation normal throughout, DTRs 2+ throughout, no cerebellar signs, gait normal Psychiatric: normal affect, behavior normal, pleasant   Had EKG in Feb , does not need today, will get next year  Monique Thomas, [place]   05/03/2017

## 2017-05-03 ENCOUNTER — Ambulatory Visit (INDEPENDENT_AMBULATORY_CARE_PROVIDER_SITE_OTHER): Payer: Medicare Other | Admitting: Physician Assistant

## 2017-05-03 ENCOUNTER — Encounter: Payer: Self-pay | Admitting: Physician Assistant

## 2017-05-03 VITALS — BP 118/66 | HR 83 | Temp 97.3°F | Resp 16 | Ht 61.0 in | Wt 126.8 lb

## 2017-05-03 DIAGNOSIS — K2100 Gastro-esophageal reflux disease with esophagitis, without bleeding: Secondary | ICD-10-CM

## 2017-05-03 DIAGNOSIS — Z0000 Encounter for general adult medical examination without abnormal findings: Secondary | ICD-10-CM | POA: Diagnosis not present

## 2017-05-03 DIAGNOSIS — Z981 Arthrodesis status: Secondary | ICD-10-CM

## 2017-05-03 DIAGNOSIS — D649 Anemia, unspecified: Secondary | ICD-10-CM

## 2017-05-03 DIAGNOSIS — Z0001 Encounter for general adult medical examination with abnormal findings: Secondary | ICD-10-CM

## 2017-05-03 DIAGNOSIS — Z1389 Encounter for screening for other disorder: Secondary | ICD-10-CM

## 2017-05-03 DIAGNOSIS — E039 Hypothyroidism, unspecified: Secondary | ICD-10-CM

## 2017-05-03 DIAGNOSIS — E559 Vitamin D deficiency, unspecified: Secondary | ICD-10-CM

## 2017-05-03 DIAGNOSIS — G894 Chronic pain syndrome: Secondary | ICD-10-CM

## 2017-05-03 DIAGNOSIS — Z23 Encounter for immunization: Secondary | ICD-10-CM

## 2017-05-03 DIAGNOSIS — R7303 Prediabetes: Secondary | ICD-10-CM

## 2017-05-03 DIAGNOSIS — E782 Mixed hyperlipidemia: Secondary | ICD-10-CM

## 2017-05-03 DIAGNOSIS — M549 Dorsalgia, unspecified: Secondary | ICD-10-CM

## 2017-05-03 DIAGNOSIS — Z79899 Other long term (current) drug therapy: Secondary | ICD-10-CM

## 2017-05-03 DIAGNOSIS — F419 Anxiety disorder, unspecified: Secondary | ICD-10-CM

## 2017-05-03 DIAGNOSIS — G8929 Other chronic pain: Secondary | ICD-10-CM

## 2017-05-03 NOTE — Patient Instructions (Signed)
Can get lidocaine patches over the counter 4%  Cologuard is an easy to use noninvasive colon cancer screening test based on the latest advances in stool DNA science.   Colon cancer is 3rd most diagnosed cancer and 2nd leading cause of death in both men and women 66 years of age and older despite being one of the most preventable and treatable cancers if found early.  4 of out 5 people diagnosed with colon cancer have NO prior family history.  When caught EARLY 90% of colon cancer is curable.   You have agreed to do a Cologuard screening and have declined a colonoscopy in spite of being explained the risks and benefits of the colonoscopy in detail, including cancer and death. Please understand that this is test not as sensitive or specific as a colonoscopy and you are still recommended to get a colonoscopy.    You will receive a short call from [place] support center at [employer], when you receive a call they will say they are from [place],  to confirm your mailing address and give you more information.  When they calll you, it will appear on the caller ID as "[employer]" or in some cases only this number will appear, [phone].   [employer] will ship your collection kit directly to you. You will collect a single stool sample in the privacy of your own home, no special preparation required. You will return the kit via [place] pre-paid shipping or pick-up, in the same box it arrived in. Then I will contact you to discuss your results after I receive them from the laboratory.   If you have any questions or concerns, Colog[employer] Customer Support Specialist are available 24 hours a day, 7 days a week at [phone] or go to [email or web address].

## 2017-05-04 NOTE — Progress Notes (Signed)
Pt aware of lab results & voiced understanding of those results.

## 2017-05-05 LAB — URINALYSIS, ROUTINE W REFLEX MICROSCOPIC
BILIRUBIN URINE: NEGATIVE
Glucose, UA: NEGATIVE
Hgb urine dipstick: NEGATIVE
Hyaline Cast: NONE SEEN /LPF
KETONES UR: NEGATIVE
NITRITE: NEGATIVE
PH: 6.5 (ref 5.0–8.0)
Protein, ur: NEGATIVE
RBC / HPF: NONE SEEN /HPF (ref 0–2)
SPECIFIC GRAVITY, URINE: 1.01 (ref 1.001–1.03)

## 2017-05-05 LAB — IRON, TOTAL/TOTAL IRON BINDING CAP
%SAT: 30 % (calc) (ref 11–50)
Iron: 94 ug/dL (ref 45–160)
TIBC: 316 mcg/dL (calc) (ref 250–450)

## 2017-05-05 LAB — CBC WITH DIFFERENTIAL/PLATELET
BASOS ABS: 41 {cells}/uL (ref 0–200)
Basophils Relative: 0.9 %
Eosinophils Absolute: 140 cells/uL (ref 15–500)
Eosinophils Relative: 3.1 %
HEMATOCRIT: 35.2 % (ref 35.0–45.0)
Hemoglobin: 11.9 g/dL (ref 11.7–15.5)
LYMPHS ABS: 1926 {cells}/uL (ref 850–3900)
MCH: 29.5 pg (ref 27.0–33.0)
MCHC: 33.8 g/dL (ref 32.0–36.0)
MCV: 87.1 fL (ref 80.0–100.0)
MPV: 8.6 fL (ref 7.5–12.5)
Monocytes Relative: 8.2 %
NEUTROS PCT: 45 %
Neutro Abs: 2025 cells/uL (ref 1500–7800)
Platelets: 338 10*3/uL (ref 140–400)
RBC: 4.04 10*6/uL (ref 3.80–5.10)
RDW: 12.4 % (ref 11.0–15.0)
Total Lymphocyte: 42.8 %
WBC: 4.5 10*3/uL (ref 3.8–10.8)
WBCMIX: 369 {cells}/uL (ref 200–950)

## 2017-05-05 LAB — BASIC METABOLIC PANEL WITH GFR
BUN: 16 mg/dL (ref 7–25)
CO2: 26 mmol/L (ref 20–32)
CREATININE: 0.59 mg/dL (ref 0.50–0.99)
Calcium: 9.2 mg/dL (ref 8.6–10.4)
Chloride: 103 mmol/L (ref 98–110)
GFR, EST NON AFRICAN AMERICAN: 96 mL/min/{1.73_m2} (ref >=60)
GFR, Est African American: 111 mL/min/{1.73_m2} (ref >=60)
Glucose, Bld: 86 mg/dL (ref 65–99)
Potassium: 4.1 mmol/L (ref 3.5–5.3)
SODIUM: 138 mmol/L (ref 135–146)

## 2017-05-05 LAB — MAGNESIUM: MAGNESIUM: 1.7 mg/dL (ref 1.5–2.5)

## 2017-05-05 LAB — HEPATIC FUNCTION PANEL
AG Ratio: 2.1 (calc) (ref 1.0–2.5)
ALBUMIN MSPROF: 4.2 g/dL (ref 3.6–5.1)
ALT: 7 U/L (ref 6–29)
AST: 15 U/L (ref 10–35)
Alkaline phosphatase (APISO): 54 U/L (ref 33–130)
BILIRUBIN DIRECT: 0.1 mg/dL (ref 0.0–0.2)
BILIRUBIN TOTAL: 0.4 mg/dL (ref 0.2–1.2)
Globulin: 2 g/dL (calc) (ref 1.9–3.7)
Indirect Bilirubin: 0.3 mg/dL (calc) (ref 0.2–1.2)
Total Protein: 6.2 g/dL (ref 6.1–8.1)

## 2017-05-05 LAB — URINE CULTURE
MICRO NUMBER: 80882958
SPECIMEN QUALITY:: ADEQUATE

## 2017-05-05 LAB — MICROALBUMIN / CREATININE URINE RATIO: Creatinine, Urine: 67 mg/dL (ref 20–320)

## 2017-05-05 LAB — VITAMIN B12: Vitamin B-12: >2000 pg/mL — ABNORMAL HIGH (ref 200–1100)

## 2017-05-05 LAB — TSH: TSH: 4.34 mIU/L (ref 0.40–4.50)

## 2017-05-05 LAB — LIPID PANEL
CHOL/HDL RATIO: 2.4 (calc) (ref <5.0)
Cholesterol: 189 mg/dL (ref <200)
HDL: 79 mg/dL (ref >50)
LDL CHOLESTEROL (CALC): 92 mg/dL
NON-HDL CHOLESTEROL (CALC): 110 mg/dL (ref <130)
TRIGLYCERIDES: 85 mg/dL (ref <150)

## 2017-05-10 ENCOUNTER — Other Ambulatory Visit: Payer: Self-pay | Admitting: Physician Assistant

## 2017-05-10 LAB — COLOGUARD: Cologuard: NEGATIVE

## 2017-05-10 MED ORDER — NITROFURANTOIN MONOHYD MACRO 100 MG PO CAPS: 100.0000 mg | ORAL_CAPSULE | Freq: Two times a day (BID) | ORAL | 0 refills | Status: AC

## 2017-05-10 NOTE — Progress Notes (Signed)
LVM for pt to return office call for LAB results.

## 2017-05-10 NOTE — Progress Notes (Signed)
Pt aware of lab results & voiced understanding of those results.

## 2017-05-10 NOTE — Progress Notes (Signed)
Future Appointments Date Time Provider [place]  11/03/2017 10:30 AM [name], MD [facility] None  05/09/2018 10:00 AM Vicie Mutters, PA-C [facility] None

## 2017-05-18 LAB — COLOGUARD

## 2017-06-12 ENCOUNTER — Other Ambulatory Visit: Payer: Self-pay | Admitting: Physician Assistant

## 2017-06-12 ENCOUNTER — Telehealth: Payer: Self-pay

## 2017-06-12 DIAGNOSIS — R350 Frequency of micturition: Secondary | ICD-10-CM

## 2017-06-12 DIAGNOSIS — E039 Hypothyroidism, unspecified: Secondary | ICD-10-CM

## 2017-06-12 MED ORDER — PREDNISONE 20 MG PO TABS: ORAL_TABLET | ORAL | 0 refills | Status: DC

## 2017-06-12 NOTE — Addendum Note (Signed)
Addended by: Vicie Mutters R on: 06/12/2017 05:25 PM   Modules accepted: Orders

## 2017-06-12 NOTE — Telephone Encounter (Signed)
Pt would like prednisone sent into her pharmacy please.

## 2017-06-12 NOTE — Telephone Encounter (Signed)
Can send in prednisone, too early for antibiotic

## 2017-06-12 NOTE — Telephone Encounter (Signed)
Pt reports she is already using flonase & taking an allergy pill & is still having headache,sinus pressure, & poss fluid in her ear. What else can she do? Please advise?

## 2017-06-13 ENCOUNTER — Other Ambulatory Visit: Payer: Self-pay

## 2017-06-13 ENCOUNTER — Ambulatory Visit (INDEPENDENT_AMBULATORY_CARE_PROVIDER_SITE_OTHER): Payer: Medicare Other

## 2017-06-13 VITALS — Ht 61.0 in | Wt 126.0 lb

## 2017-06-13 DIAGNOSIS — E039 Hypothyroidism, unspecified: Secondary | ICD-10-CM

## 2017-06-13 DIAGNOSIS — Z23 Encounter for immunization: Secondary | ICD-10-CM | POA: Diagnosis not present

## 2017-06-13 DIAGNOSIS — R350 Frequency of micturition: Secondary | ICD-10-CM

## 2017-06-13 DIAGNOSIS — N390 Urinary tract infection, site not specified: Secondary | ICD-10-CM

## 2017-06-13 NOTE — Addendum Note (Signed)
Addended by: Melbourne Abts C on: 06/13/2017 11:47 AM   Modules accepted: Orders

## 2017-06-13 NOTE — Progress Notes (Signed)
Pt presents for lab blood work TSH, U/C, U/A. Pt also was given her HD flu vaccines in right arm w/o any issues at this time.

## 2017-06-14 LAB — URINALYSIS, ROUTINE W REFLEX MICROSCOPIC
Bilirubin Urine: NEGATIVE
GLUCOSE, UA: NEGATIVE
Hgb urine dipstick: NEGATIVE
Ketones, ur: NEGATIVE
LEUKOCYTES UA: NEGATIVE
Nitrite: NEGATIVE
PH: 7 (ref 5.0–8.0)
PROTEIN: NEGATIVE
SPECIFIC GRAVITY, URINE: 1.008 (ref 1.001–1.03)

## 2017-06-14 LAB — TSH: TSH: 1.47 m[IU]/L (ref 0.40–4.50)

## 2017-06-14 NOTE — Progress Notes (Signed)
Pt aware of lab results & voiced understanding of those results.

## 2017-06-15 ENCOUNTER — Other Ambulatory Visit: Payer: Self-pay | Admitting: Internal Medicine

## 2017-06-15 DIAGNOSIS — F419 Anxiety disorder, unspecified: Secondary | ICD-10-CM

## 2017-06-15 LAB — URINE CULTURE
MICRO NUMBER:: 81061729
SPECIMEN QUALITY: ADEQUATE

## 2017-06-16 NOTE — Telephone Encounter (Signed)
Xanax called into pharmacy on 28th Sept 2018 BY DD

## 2017-07-12 ENCOUNTER — Encounter: Payer: Self-pay | Admitting: Internal Medicine

## 2017-08-09 LAB — HM MAMMOGRAPHY

## 2017-08-14 ENCOUNTER — Encounter: Payer: Self-pay | Admitting: Internal Medicine

## 2017-08-17 ENCOUNTER — Ambulatory Visit: Payer: Medicare Other | Admitting: Adult Health

## 2017-08-17 ENCOUNTER — Encounter: Payer: Self-pay | Admitting: Adult Health

## 2017-08-17 VITALS — BP 122/76 | HR 90 | Temp 98.1°F | Ht 61.0 in | Wt 126.8 lb

## 2017-08-17 DIAGNOSIS — F419 Anxiety disorder, unspecified: Secondary | ICD-10-CM | POA: Diagnosis not present

## 2017-08-17 DIAGNOSIS — M7582 Other shoulder lesions, left shoulder: Secondary | ICD-10-CM

## 2017-08-17 MED ORDER — PREDNISONE 20 MG PO TABS: ORAL_TABLET | ORAL | 0 refills | Status: DC

## 2017-08-17 MED ORDER — ALPRAZOLAM 1 MG PO TABS: ORAL_TABLET | ORAL | 0 refills | Status: DC

## 2017-08-17 NOTE — Progress Notes (Signed)
Assessment and Plan:  Cloyce was seen today for shoulder pain.  Diagnoses and all orders for this visit:  Infraspinatus tendinitis, left No weakness/loss of ROM - Instructed to avoid aggravating motions, avoid sleeping on the side until recovered. Consider applying ice. Towards end of steroid course initiate gentle ROM to prevent loss of function and adhesions. Follow up if not improving.  -     predniSONE (DELTASONE) 20 MG tablet; 2 tablets daily for 3 days, 1 tablet daily for 4 days.  Further disposition pending results of labs. Discussed med's effects and SE's.   Over 15 minutes of exam, counseling, chart review, and critical decision making was performed.   Future Appointments  Date Time Provider Price  11/03/2017 10:30 AM [name], MD [facility] None  05/09/2018 10:00 AM Vicie Mutters, PA-C [facility] None    ------------------------------------------------------------------------------------------------------------------   HPI BP 122/76   Pulse 90   Temp 98.1 F (36.7 C)   Ht 5\' 1"  (1.549 m)   Wt 126 lb 12.8 oz (57.5 kg)   SpO2 97%   BMI 23.96 kg/m   66 y.o.female presents for left shoulder pain ongoing since she had lower back surgery in July. She reports the pain is intermittent, sharp, 5/10 at worst, non-radiating - she indicates posterior shoulder pain that is worse with external rotation (such as when she hooks her bra in the back) and with sleeping on the affected side. She denies notable weakness or noting a loss of ROM. She has been taking NSAIDs for her back pain. She does not have pain at rest. She denies numbness/tingling, weakness of the distal extremity.   Past Medical History:  Diagnosis Date  . Anemia   . Anxiety   . Arthritis   . Back pain, chronic   . Depression   . GERD (gastroesophageal reflux disease)   . Hyperlipidemia   . Hypothyroidism   . Kidney stone    2015  . Thyroid disease   . Umbilical hernia   . Unspecified  vitamin D deficiency      Allergies  Allergen Reactions  . Celebrex [Celecoxib]     "panic attack"  . Other     Skin bruises very easily and peels back  . Topamax [Topiramate]     MEMORY  . Entex Lq [Phenylephrine-Guaifenesin] Rash  . Fiorinal [Butalbital-Aspirin-Caffeine] Rash    Current Outpatient Medications on File Prior to Visit  Medication Sig  . ALPRAZolam (XANAX) 1 MG tablet TAKE 1/2 TO 1 TABLET BY MOUTH THREE TIMES DAILY AS NEEDED FOR ANXIETY OR SLEEP  . cholecalciferol (VITAMIN D) 1000 UNITS tablet Take 3,000 Units by mouth daily.   . Cyanocobalamin (B-12 PO) Take 1 tablet by mouth daily.  Marland Kitchen estradiol (ESTRACE) 0.5 MG tablet Take 1 mg by mouth daily.   . Magnesium 500 MG CAPS Take 1,000 mg by mouth daily with lunch.   . SYNTHROID 75 MCG tablet Take 26mcg by mouth daily  . cyclobenzaprine (FLEXERIL) 5 MG tablet Take 1 tablet (5 mg total) by mouth 3 (three) times daily as needed for muscle spasms. (Patient not taking: Reported on 08/17/2017)  . lidocaine (LIDODERM) 5 % Apply 1 patch for 6 hours on then 6 hours off daily  . predniSONE (DELTASONE) 20 MG tablet 2 tablets daily for 3 days, 1 tablet daily for 4 days. (Patient not taking: Reported on 08/17/2017)   No current facility-administered medications on file prior to visit.     ROS: all negative except above.  Physical Exam:  BP 122/76   Pulse 90   Temp 98.1 F (36.7 C)   Ht 5\' 1"  (1.549 m)   Wt 126 lb 12.8 oz (57.5 kg)   SpO2 97%   BMI 23.96 kg/m   General Appearance: Well nourished, in no apparent distress.  Neck: Supple, thyroid normal.  Respiratory: Respiratory effort normal, BS equal bilaterally without rales, rhonchi, wheezing or stridor.  Cardio: RRR with no MRGs. Brisk peripheral pulses without edema.  Abdomen: Soft, + BS.  Non tender, no guarding, rebound, hernias, masses. Lymphatics: Non tender without lymphadenopathy.  Musculoskeletal: Full ROM, 5/5 strength - symmetrical, normal gait. She  demonstrates full active ROM with bilateral upper extremities; poorly localized pain with external rotation of shoulder on L - not notably weak against resistance, no crepitus or palpable bony deformity. Not tender to palpation.  Skin: Warm, dry without rashes, lesions, ecchymosis.  Neuro: Cranial nerves intact. Normal muscle tone, no cerebellar symptoms. Sensation intact.  Psych: Awake and oriented X 3, normal affect, Insight and Judgment appropriate.     Izora Ribas, NP 4:17 PM [facility]

## 2017-08-17 NOTE — Patient Instructions (Addendum)
Shoulder Pain Many things can cause shoulder pain, including:  An injury to the area.  Overuse of the shoulder.  Arthritis. The source of the pain can be:  Inflammation.  An injury to the shoulder joint.  An injury to a tendon, ligament, or bone. Follow these instructions at home: Take these actions to help with your pain:  Squeeze a soft ball or a foam pad as much as possible. This helps to keep the shoulder from swelling. It also helps to strengthen the arm.  Take over-the-counter and prescription medicines only as told by your health care provider.  If directed, apply ice to the area:  Put ice in a plastic bag.  Place a towel between your skin and the bag.  Leave the ice on for 20 minutes, 2-3 times per day. Stop applying ice if it does not help with the pain.  If you were given a shoulder sling or immobilizer:  Wear it as told.  Remove it to shower or bathe.  Move your arm as little as possible, but keep your hand moving to prevent swelling. Contact a health care provider if:  Your pain gets worse.  Your pain is not relieved with medicines.  New pain develops in your arm, hand, or fingers. Get help right away if:  Your arm, hand, or fingers:  Tingle.  Become numb.  Become swollen.  Become painful.  Turn white or blue. This information is not intended to replace advice given to you by your health care provider. Make sure you discuss any questions you have with your health care provider. Document Released: 06/15/2005 Document Revised: 05/01/2016 Document Reviewed: 12/29/2014 Elsevier Interactive Patient Education  2017 [employer]  

## 2017-08-18 ENCOUNTER — Other Ambulatory Visit: Payer: Self-pay | Admitting: Physician Assistant

## 2017-08-18 DIAGNOSIS — F419 Anxiety disorder, unspecified: Secondary | ICD-10-CM

## 2017-08-22 NOTE — Progress Notes (Signed)
Alprazolam was called in on 08/18/17

## 2017-09-14 ENCOUNTER — Encounter: Payer: Self-pay | Admitting: Internal Medicine

## 2017-09-21 ENCOUNTER — Ambulatory Visit: Payer: Self-pay | Admitting: Adult Health

## 2017-10-26 ENCOUNTER — Other Ambulatory Visit: Payer: Self-pay | Admitting: Internal Medicine

## 2017-10-26 ENCOUNTER — Ambulatory Visit: Payer: Medicare Other

## 2017-10-26 DIAGNOSIS — Z79899 Other long term (current) drug therapy: Secondary | ICD-10-CM

## 2017-10-26 DIAGNOSIS — E039 Hypothyroidism, unspecified: Secondary | ICD-10-CM

## 2017-10-26 NOTE — Progress Notes (Signed)
Patient presents to the office for a nurse visit to have labs done. Patient has had extreme leg pain. Pain is a 10. Has been having symptoms since Monday and hasn't improved at all. Patient is currently taking Aleve for the pain.

## 2017-10-26 NOTE — Addendum Note (Signed)
Addended by: Eulis Canner on: 10/26/2017 02:51 PM   Modules accepted: Orders

## 2017-10-27 LAB — MAGNESIUM: Magnesium: 1.9 mg/dL (ref 1.5–2.5)

## 2017-10-27 LAB — TSH: TSH: 3.38 m[IU]/L (ref 0.40–4.50)

## 2017-10-31 ENCOUNTER — Encounter: Payer: Self-pay | Admitting: Physician Assistant

## 2017-11-03 ENCOUNTER — Ambulatory Visit: Payer: Self-pay | Admitting: Internal Medicine

## 2017-11-07 ENCOUNTER — Ambulatory Visit: Payer: Medicare Other | Admitting: Internal Medicine

## 2017-11-07 ENCOUNTER — Other Ambulatory Visit: Payer: Self-pay | Admitting: Internal Medicine

## 2017-11-07 ENCOUNTER — Encounter: Payer: Self-pay | Admitting: Internal Medicine

## 2017-11-07 VITALS — BP 122/72 | HR 84 | Temp 97.8°F | Resp 16 | Ht 61.0 in | Wt 130.4 lb

## 2017-11-07 DIAGNOSIS — E039 Hypothyroidism, unspecified: Secondary | ICD-10-CM | POA: Diagnosis not present

## 2017-11-07 DIAGNOSIS — Z79899 Other long term (current) drug therapy: Secondary | ICD-10-CM

## 2017-11-07 DIAGNOSIS — R7309 Other abnormal glucose: Secondary | ICD-10-CM

## 2017-11-07 DIAGNOSIS — R7303 Prediabetes: Secondary | ICD-10-CM

## 2017-11-07 DIAGNOSIS — E559 Vitamin D deficiency, unspecified: Secondary | ICD-10-CM

## 2017-11-07 DIAGNOSIS — R0989 Other specified symptoms and signs involving the circulatory and respiratory systems: Secondary | ICD-10-CM

## 2017-11-07 DIAGNOSIS — E782 Mixed hyperlipidemia: Secondary | ICD-10-CM

## 2017-11-07 DIAGNOSIS — D509 Iron deficiency anemia, unspecified: Secondary | ICD-10-CM | POA: Diagnosis not present

## 2017-11-07 DIAGNOSIS — G2581 Restless legs syndrome: Secondary | ICD-10-CM

## 2017-11-07 MED ORDER — ROPINIROLE HCL 1 MG PO TABS
ORAL_TABLET | ORAL | 2 refills | Status: DC
Start: 2017-11-07 — End: 2017-11-07

## 2017-11-07 NOTE — Patient Instructions (Addendum)
Restless Legs Syndrome Restless legs syndrome is a condition that causes uncomfortable feelings or sensations in the legs, especially while sitting or lying down. The sensations usually cause an overwhelming urge to move the legs. The arms can also sometimes be affected. The condition can range from mild to severe. The symptoms often interfere with a person's ability to sleep. What are the causes? The cause of this condition is not known. What increases the risk? This condition is more likely to develop in:  People who are older than age 65.  Pregnant women. In general, restless legs syndrome is more common in women than in men.  People who have a family history of the condition.  People who have certain medical conditions, such as iron deficiency, kidney disease, Parkinson disease, or nerve damage.  People who take certain medicines, such as medicines for high blood pressure, nausea, colds, allergies, depression, and some heart conditions.  What are the signs or symptoms? The main symptom of this condition is uncomfortable sensations in the legs. These sensations may be:  Described as pulling, tingling, prickling, throbbing, crawling, or burning.  Worse while you are sitting or lying down.  Worse during periods of rest or inactivity.  Worse at night, often interfering with your sleep.  Accompanied by a very strong urge to move your legs.  Temporarily relieved by movement of your legs.  The sensations usually affect both sides of the body. The arms can also be affected, but this is rare. People who have this condition often have tiredness during the day because of their lack of sleep at night. How is this diagnosed? This condition may be diagnosed based on your description of the symptoms. You may also have tests, including blood tests, to check for other conditions that may lead to your symptoms. In some cases, you may be asked to spend some time in a sleep lab so your sleeping  can be monitored. How is this treated? Treatment for this condition is focused on managing the symptoms. Treatment may include:  Self-help and lifestyle changes.  Medicines.  Follow these instructions at home:  Take medicines only as directed by your health care provider.  Try these methods to get temporary relief from the uncomfortable sensations: ? Massage your legs. ? Walk or stretch. ? Take a cold or hot bath.  Practice good sleep habits. For example, go to bed and get up at the same time every day.  Exercise regularly.  Practice ways of relaxing, such as yoga or meditation.  Avoid caffeine and alcohol.  Do not use any tobacco products, including cigarettes, chewing tobacco, or electronic cigarettes. If you need help quitting, ask your health care provider.  Keep all follow-up visits as directed by your health care provider. This is important. Contact a health care provider if: Your symptoms do not improve with treatment, or they get worse. This information is not intended to replace advice given to you by your health care provider. Make sure you discuss any questions you have with your health care provider. Document Released: 08/26/2002 Document Revised: 02/11/2016 Document Reviewed: 09/01/2014 Elsevier Interactive Patient Education  2018 [place].   ++++++++++++++++++++++++++++ Recommend Adult Low Dose Aspirin or  coated  Aspirin 81 mg daily  To reduce risk of Colon Cancer 20 %,  Skin Cancer 26 % ,  Melanoma 46%  and  Pancreatic cancer 60% +++++++++++++++++++++++++ Vitamin D goal  is between 70-100.  Please make sure that you are taking your Vitamin D as directed.  It is very important as a natural anti-inflammatory  helping hair, skin, and nails, as well as reducing stroke and heart attack risk.  It helps your bones and helps with mood. It also decreases numerous cancer risks so please take it as directed.  Low Vit D is associated with a 200-300% higher  risk for CANCER  and 200-300% higher risk for HEART   ATTACK  &  STROKE.   .....................................Marland Kitchen It is also associated with higher death rate at younger ages,  autoimmune diseases like Rheumatoid arthritis, Lupus, Multiple Sclerosis.    Also many other serious conditions, like depression, Alzheimer's Dementia, infertility, muscle aches, fatigue, fibromyalgia - just to name a few. ++++++++++++++++++++ Recommend the book "The END of DIETING" by Dr Excell Seltzer  & the book "The END of DIABETES " by Dr Excell Seltzer At [email or web address] - get book & Audio CD's    Being diabetic has a  300% increased risk for heart attack, stroke, cancer, and alzheimer- type vascular dementia. It is very important that you work harder with diet by avoiding all foods that are white. Avoid white rice (brown & wild rice is OK), white potatoes (sweetpotatoes in moderation is OK), White bread or wheat bread or anything made out of white flour like bagels, donuts, rolls, buns, biscuits, cakes, pastries, cookies, pizza crust, and pasta (made from white flour & egg whites) - vegetarian pasta or spinach or wheat pasta is OK. Multigrain breads like Arnold's or Pepperidge Farm, or multigrain sandwich thins or flatbreads.  Diet, exercise and weight loss can reverse and cure diabetes in the early stages.  Diet, exercise and weight loss is very important in the control and prevention of complications of diabetes which affects every system in your body, ie. Brain - dementia/stroke, eyes - glaucoma/blindness, heart - heart attack/heart failure, kidneys - dialysis, stomach - gastric paralysis, intestines - malabsorption, nerves - severe painful neuritis, circulation - gangrene & loss of a leg(s), and finally cancer and Alzheimers.    I recommend avoid fried & greasy foods,  sweets/candy, white rice (brown or wild rice or Quinoa is OK), white potatoes (sweet potatoes are OK) - anything made from white flour - bagels, doughnuts,  rolls, buns, biscuits,white and wheat breads, pizza crust and traditional pasta made of white flour & egg white(vegetarian pasta or spinach or wheat pasta is OK).  Multi-grain bread is OK - like multi-grain flat bread or sandwich thins. Avoid alcohol in excess. Exercise is also important.    Eat all the vegetables you want - avoid meat, especially red meat and dairy - especially cheese.  Cheese is the most concentrated form of trans-fats which is the worst thing to clog up our arteries. Veggie cheese is OK which can be found in the fresh produce section at [employer] or [employer] or [employer]  +++++++++++++++++++++ DASH Eating Plan  DASH stands for "Dietary Approaches to Stop Hypertension."   The DASH eating plan is a healthy eating plan that has been shown to reduce high blood pressure (hypertension). Additional health benefits may include reducing the risk of type 2 diabetes mellitus, heart disease, and stroke. The DASH eating plan may also help with weight loss. WHAT DO I NEED TO KNOW ABOUT THE DASH EATING PLAN? For the DASH eating plan, you will follow these general guidelines:  Choose foods with a percent daily value for sodium of less than 5% (as listed on the food label).  Use salt-free seasonings or herbs instead of table salt or sea  salt.  Check with your health care provider or pharmacist before using salt substitutes.  Eat lower-sodium products, often labeled as "lower sodium" or "no salt added."  Eat fresh foods.  Eat more vegetables, fruits, and low-fat dairy products.  Choose whole grains. Look for the word "whole" as the first word in the ingredient list.  Choose fish   Limit sweets, desserts, sugars, and sugary drinks.  Choose heart-healthy fats.  Eat veggie cheese   Eat more home-cooked food and less restaurant, buffet, and fast food.  Limit fried foods.  Cook foods using methods other than frying.  Limit canned vegetables. If you do use them, rinse them  well to decrease the sodium.  When eating at a restaurant, ask that your food be prepared with less salt, or no salt if possible.                      WHAT FOODS CAN I EAT? Read Dr Fara Olden Fuhrman's books on The End of Dieting & The End of Diabetes  Grains Whole grain or whole wheat bread. Brown rice. Whole grain or whole wheat pasta. Quinoa, bulgur, and whole grain cereals. Low-sodium cereals. Corn or whole wheat flour tortillas. Whole grain cornbread. Whole grain crackers. Low-sodium crackers.  Vegetables Fresh or frozen vegetables (raw, steamed, roasted, or grilled). Low-sodium or reduced-sodium tomato and vegetable juices. Low-sodium or reduced-sodium tomato sauce and paste. Low-sodium or reduced-sodium canned vegetables.   Fruits All fresh, canned (in natural juice), or frozen fruits.  Protein Products  All fish and seafood.  Dried beans, peas, or lentils. Unsalted nuts and seeds. Unsalted canned beans.  Dairy Low-fat dairy products, such as skim or 1% milk, 2% or reduced-fat cheeses, low-fat ricotta or cottage cheese, or plain low-fat yogurt. Low-sodium or reduced-sodium cheeses.  Fats and Oils Tub margarines without trans fats. Light or reduced-fat mayonnaise and salad dressings (reduced sodium). Avocado. Safflower, olive, or canola oils. Natural peanut or almond butter.  Other Unsalted popcorn and pretzels. The items listed above may not be a complete list of recommended foods or beverages. Contact your dietitian for more options.  +++++++++++++++  WHAT FOODS ARE NOT RECOMMENDED? Grains/ White flour or wheat flour White bread. White pasta. White rice. Refined cornbread. Bagels and croissants. Crackers that contain trans fat.  Vegetables  Creamed or fried vegetables. Vegetables in a . Regular canned vegetables. Regular canned tomato sauce and paste. Regular tomato and vegetable juices.  Fruits Dried fruits. Canned fruit in light or heavy syrup. Fruit juice.  Meat and  Other Protein Products Meat in general - RED meat & White meat.  Fatty cuts of meat. Ribs, chicken wings, all processed meats as bacon, sausage, bologna, salami, fatback, hot dogs, bratwurst and packaged luncheon meats.  Dairy Whole or 2% milk, cream, half-and-half, and cream cheese. Whole-fat or sweetened yogurt. Full-fat cheeses or blue cheese. Non-dairy creamers and whipped toppings. Processed cheese, cheese spreads, or cheese curds.  Condiments Onion and garlic salt, seasoned salt, table salt, and sea salt. Canned and packaged gravies. Worcestershire sauce. Tartar sauce. Barbecue sauce. Teriyaki sauce. Soy sauce, including reduced sodium. Steak sauce. Fish sauce. Oyster sauce. Cocktail sauce. Horseradish. Ketchup and mustard. Meat flavorings and tenderizers. Bouillon cubes. Hot sauce. Tabasco sauce. Marinades. Taco seasonings. Relishes.  Fats and Oils Butter, stick margarine, lard, shortening and bacon fat. Coconut, palm kernel, or palm oils. Regular salad dressings.  Pickles and olives. Salted popcorn and pretzels.  The items listed above may not be a complete  list of foods and beverages to avoid.

## 2017-11-07 NOTE — Progress Notes (Signed)
This very nice 67 y.o. WWF  presents for 3 month follow up with Hypertension, Hyperlipidemia, Pre-Diabetes and Vitamin D Deficiency.      Patient is also c/o more frequent intermittent Restless legs sx's sometimes interrupting sleep.      Patient is treated for HTN & BP has been controlled at home. Today's BP is at goal - 122/72. Patient has had no complaints of any cardiac type chest pain, palpitations, dyspnea / orthopnea / PND, dizziness, claudication, or dependent edema. BP Readings from Last 3 Encounters:  11/07/17 122/72  08/17/17 122/76  05/03/17 118/66      Hyperlipidemia is controlled with diet & meds. Patient denies myalgias or other med SE's. Last Lipids were at goal: Lab Results  Component Value Date   CHOL 190 11/07/2017   HDL 73 11/07/2017   LDLCALC 101 (H) 10/31/2016   TRIG 147 11/07/2017   CHOLHDL 2.6 11/07/2017      Also, the patient is monitored expectantly for PreDiabetes and has had no symptoms of reactive hypoglycemia, diabetic polys, paresthesias or visual blurring.  Last A1c was Normal & at goal: Lab Results  Component Value Date   HGBA1C 5.3 06/30/2016      Patient has been on Thyroid replacement since the 1980's.      Further, the patient also has history of Vitamin D Deficiency and supplements vitamin D without any suspected side-effects. Last vitamin D was still low (goal 70-100):   Lab Results  Component Value Date   VD25OH 41 10/31/2016   Current Outpatient Medications on File Prior to Visit  Medication Sig  . cholecalciferol (VITAMIN D) 1000 UNITS tablet Take 3,000 Units by mouth daily.   . Cyanocobalamin (B-12 PO) Take 1 tablet by mouth daily.  . cyclobenzaprine (FLEXERIL) 5 MG tablet Take 1 tablet (5 mg total) by mouth 3 (three) times daily as needed for muscle spasms.  Marland Kitchen estradiol (ESTRACE) 1 MG tablet Take 1 mg by mouth daily.  . Magnesium 500 MG CAPS Take 1,000 mg by mouth daily with lunch.   . SYNTHROID 75 MCG tablet Take 81mcg by mouth  daily   No current facility-administered medications on file prior to visit.    Allergies  Allergen Reactions  . Celebrex [Celecoxib]     "panic attack"  . Other     Skin bruises very easily and peels back  . Topamax [Topiramate]     MEMORY  . Entex Lq [Phenylephrine-Guaifenesin] Rash  . Fiorinal [Butalbital-Aspirin-Caffeine] Rash   PMHx:   Past Medical History:  Diagnosis Date  . Anemia   . Anxiety   . Arthritis   . Back pain, chronic   . Depression   . GERD (gastroesophageal reflux disease)   . Hyperlipidemia   . Hypothyroidism   . Kidney stone    2015  . Thyroid disease   . Umbilical hernia   . Unspecified vitamin D deficiency    Immunization History  Administered Date(s) Administered  . Influenza Split 06/29/2015  . Influenza Whole 05/21/2013  . Influenza, High Dose Seasonal PF 05/30/2016, 06/13/2017  . PPD Test 09/23/2013, 10/01/2014, 02/26/2016  . Pneumococcal Conjugate-13 05/03/2017  . Pneumococcal Polysaccharide-23 09/23/2013  . Td 09/19/2005  . Tdap 10/07/2015  . Zoster 03/27/2014   Past Surgical History:  Procedure Laterality Date  . ABDOMINAL HYSTERECTOMY    . ANTERIOR LAT LUMBAR FUSION N/A 04/07/2017   Procedure: LUMBAR ONE-TWO ANTERIOR LATERAL LUMBAR FUSION WITH LATERAL PLATE;  Surgeon: Eustace Moore, MD;  Location: [facility];  Service: Neurosurgery;  Laterality: N/A;  Right side approach  . APPENDECTOMY    . CESAREAN SECTION    . HERNIA REPAIR    . LAPAROSCOPIC LYSIS INTESTINAL ADHESIONS    . MAXIMUM ACCESS (MAS)POSTERIOR LUMBAR INTERBODY FUSION (PLIF) 1 LEVEL N/A 04/16/2015   Procedure: masPLIF  - L2-L3 ;  Surgeon: Eustace Moore, MD;  Location: [facility];  Service: Neurosurgery;  Laterality: N/A;  masPLIF  - L2-L3   . SPINE SURGERY     lumbar  . TONSILLECTOMY AND ADENOIDECTOMY     FHx:    Reviewed / unchanged  SHx:    Reviewed / unchanged  Systems Review:  Constitutional: Denies fever, chills, wt changes, headaches, insomnia, fatigue, night  sweats, change in appetite. Eyes: Denies redness, blurred vision, diplopia, discharge, itchy, watery eyes.  ENT: Denies discharge, congestion, post nasal drip, epistaxis, sore throat, earache, hearing loss, dental pain, tinnitus, vertigo, sinus pain, snoring.  CV: Denies chest pain, palpitations, irregular heartbeat, syncope, dyspnea, diaphoresis, orthopnea, PND, claudication or edema. Respiratory: denies cough, dyspnea, DOE, pleurisy, hoarseness, laryngitis, wheezing.  Gastrointestinal: Denies dysphagia, odynophagia, heartburn, reflux, water brash, abdominal pain or cramps, nausea, vomiting, bloating, diarrhea, constipation, hematemesis, melena, hematochezia  or hemorrhoids. Genitourinary: Denies dysuria, frequency, urgency, nocturia, hesitancy, discharge, hematuria or flank pain. Musculoskeletal: Denies arthralgias, myalgias, stiffness, jt. swelling, pain, limping or strain/sprain.  Skin: Denies pruritus, rash, hives, warts, acne, eczema or change in skin lesion(s). Neuro: No weakness, tremor, incoordination, spasms, paresthesia or pain. Psychiatric: Denies confusion, memory loss or sensory loss. Endo: Denies change in weight, skin or hair change.  Heme/Lymph: No excessive bleeding, bruising or enlarged lymph nodes.  Physical Exam  BP 122/72   Pulse 84   Temp 97.8 F (36.6 C)   Resp 16   Ht 5\' 1"  (1.549 m)   Wt 130 lb 6.4 oz (59.1 kg)   BMI 24.64 kg/m   Postural Sitting BP  137/74  P 4  And  Standing BP 157/74  P 70  Appears well nourished, well groomed  and in no distress.  Eyes: PERRLA, EOMs, conjunctiva no swelling or erythema. Sinuses: No frontal/maxillary tenderness ENT/Mouth: EAC's clear, TM's nl w/o erythema, bulging. Nares clear w/o erythema, swelling, exudates. Oropharynx clear without erythema or exudates. Oral hygiene is good. Tongue normal, non obstructing. Hearing intact.  Neck: Supple. Thyroid not palpable. Car 2+/2+ without bruits, nodes or JVD. Chest: Respirations  nl with BS clear & equal w/o rales, rhonchi, wheezing or stridor.  Cor: Heart sounds normal w/ regular rate and rhythm without sig. murmurs, gallops, clicks or rubs. Peripheral pulses normal and equal  without edema.  Abdomen: Soft & bowel sounds normal. Non-tender w/o guarding, rebound, hernias, masses or organomegaly.  Lymphatics: Unremarkable.  Musculoskeletal: Full ROM all peripheral extremities, joint stability, 5/5 strength and normal gait.  Skin: Warm, dry without exposed rashes, lesions or ecchymosis apparent.  Neuro: Cranial nerves intact, reflexes equal bilaterally. Sensory-motor testing grossly intact. Tendon reflexes grossly intact.  Pysch: Alert & oriented x 3.  Insight and judgement nl & appropriate. No ideations.  Assessment and Plan:  1. Labile hypertension  - Continue medication, monitor blood pressure at home.  - Continue DASH diet. Reminder to go to the ER if any CP,  SOB, nausea, dizziness, severe HA, changes vision/speech. - Encouraged more frequent home monitoring & call if elevated.  - CBC with Differential/Platelet - BASIC METABOLIC PANEL WITH GFR - Magnesium - TSH  2. Hyperlipidemia, mixed  -  Continue diet/meds, exercise,& lifestyle modifications.  - Continue monitor periodic cholesterol/liver & renal functions   - Hepatic function panel - Lipid panel - TSH  3. Prediabetes  - Continue diet, exercise, lifestyle modifications.  - Monitor appropriate labs.  - Hemoglobin A1c - Insulin, random  4. Vitamin D deficiency  - Continue supplementation.  - VITAMIN D 25 Hydroxy   5. Abnormal glucose  - Hemoglobin A1c - Insulin, random  6. Hypothyroidism  - TSH  7. RLS (restless legs syndrome)  - Rx Requip 1 mg # 270 - take 1/2 - 1 tab 2 - 3 x/ day  X 2 rf - discussed med effects & SE's.  8. Iron deficiency anemia  - Iron,Total/Total Iron Binding Cap - Ferritin  9. Medication management  - CBC with Differential/Platelet - BASIC  METABOLIC PANEL WITH GFR - Hepatic function panel - Magnesium - Lipid panel - TSH - Hemoglobin A1c - Insulin, random - VITAMIN D 25 Hydroxy        Discussed  regular exercise, BP monitoring, weight control to achieve/maintain BMI less than 25 and discussed med and SE's. Recommended labs to assess and monitor clinical status with further disposition pending results of labs. Over 30 minutes of exam, counseling, chart review was performed.

## 2017-11-08 LAB — HEPATIC FUNCTION PANEL
AG RATIO: 1.9 (calc) (ref 1.0–2.5)
ALBUMIN MSPROF: 4.2 g/dL (ref 3.6–5.1)
ALT: 8 U/L (ref 6–29)
AST: 16 U/L (ref 10–35)
Alkaline phosphatase (APISO): 54 U/L (ref 33–130)
BILIRUBIN DIRECT: 0.1 mg/dL (ref 0.0–0.2)
BILIRUBIN INDIRECT: 0.3 mg/dL (ref 0.2–1.2)
BILIRUBIN TOTAL: 0.4 mg/dL (ref 0.2–1.2)
GLOBULIN: 2.2 g/dL (ref 1.9–3.7)
Total Protein: 6.4 g/dL (ref 6.1–8.1)

## 2017-11-08 LAB — CBC WITH DIFFERENTIAL/PLATELET
BASOS PCT: 1.3 %
Basophils Absolute: 62 cells/uL (ref 0–200)
EOS ABS: 211 {cells}/uL (ref 15–500)
Eosinophils Relative: 4.4 %
HEMATOCRIT: 36.2 % (ref 35.0–45.0)
HEMOGLOBIN: 12.6 g/dL (ref 11.7–15.5)
LYMPHS ABS: 2093 {cells}/uL (ref 850–3900)
MCH: 30.8 pg (ref 27.0–33.0)
MCHC: 34.8 g/dL (ref 32.0–36.0)
MCV: 88.5 fL (ref 80.0–100.0)
MPV: 8.5 fL (ref 7.5–12.5)
Monocytes Relative: 7.5 %
Neutro Abs: 2074 cells/uL (ref 1500–7800)
Neutrophils Relative %: 43.2 %
PLATELETS: 355 10*3/uL (ref 140–400)
RBC: 4.09 10*6/uL (ref 3.80–5.10)
RDW: 12.5 % (ref 11.0–15.0)
TOTAL LYMPHOCYTE: 43.6 %
WBC: 4.8 10*3/uL (ref 3.8–10.8)
WBCMIX: 360 {cells}/uL (ref 200–950)

## 2017-11-08 LAB — LIPID PANEL
CHOL/HDL RATIO: 2.6 (calc) (ref <5.0)
Cholesterol: 190 mg/dL (ref <200)
HDL: 73 mg/dL (ref >50)
LDL CHOLESTEROL (CALC): 93 mg/dL
Non-HDL Cholesterol (Calc): 117 mg/dL (calc) (ref <130)
TRIGLYCERIDES: 147 mg/dL (ref <150)

## 2017-11-08 LAB — IRON, TOTAL/TOTAL IRON BINDING CAP
%SAT: 24 % (calc) (ref 11–50)
Iron: 72 ug/dL (ref 45–160)
TIBC: 295 ug/dL (ref 250–450)

## 2017-11-08 LAB — MAGNESIUM: Magnesium: 1.9 mg/dL (ref 1.5–2.5)

## 2017-11-08 LAB — BASIC METABOLIC PANEL WITH GFR
BUN: 16 mg/dL (ref 7–25)
CO2: 25 mmol/L (ref 20–32)
CREATININE: 0.67 mg/dL (ref 0.50–0.99)
Calcium: 9.3 mg/dL (ref 8.6–10.4)
Chloride: 108 mmol/L (ref 98–110)
GFR, EST AFRICAN AMERICAN: 106 mL/min/{1.73_m2} (ref >=60)
GFR, EST NON AFRICAN AMERICAN: 92 mL/min/{1.73_m2} (ref >=60)
Glucose, Bld: 106 mg/dL — ABNORMAL HIGH (ref 65–99)
Potassium: 5.1 mmol/L (ref 3.5–5.3)
Sodium: 142 mmol/L (ref 135–146)

## 2017-11-08 LAB — INSULIN, RANDOM: INSULIN: 9.3 u[IU]/mL (ref 2.0–19.6)

## 2017-11-08 LAB — TSH: TSH: 2.07 m[IU]/L (ref 0.40–4.50)

## 2017-11-08 LAB — HEMOGLOBIN A1C
HEMOGLOBIN A1C: 5.2 %{Hb} (ref <5.7)
MEAN PLASMA GLUCOSE: 103 (calc)
eAG (mmol/L): 5.7 (calc)

## 2017-11-08 LAB — VITAMIN D 25 HYDROXY (VIT D DEFICIENCY, FRACTURES): VIT D 25 HYDROXY: 33 ng/mL (ref 30–100)

## 2017-11-08 LAB — FERRITIN: Ferritin: 23 ng/mL (ref 20–288)

## 2017-11-17 DIAGNOSIS — M25531 Pain in right wrist: Secondary | ICD-10-CM | POA: Insufficient documentation

## 2017-11-17 DIAGNOSIS — M25562 Pain in left knee: Secondary | ICD-10-CM | POA: Insufficient documentation

## 2018-01-09 ENCOUNTER — Other Ambulatory Visit: Payer: Self-pay | Admitting: Internal Medicine

## 2018-01-09 DIAGNOSIS — F419 Anxiety disorder, unspecified: Secondary | ICD-10-CM

## 2018-01-09 MED ORDER — ALPRAZOLAM 1 MG PO TABS: ORAL_TABLET | ORAL | 0 refills | Status: DC

## 2018-01-22 ENCOUNTER — Encounter: Payer: Self-pay | Admitting: Adult Health

## 2018-01-22 ENCOUNTER — Ambulatory Visit: Payer: Medicare Other | Admitting: Adult Health

## 2018-01-22 VITALS — BP 114/74 | HR 86 | Temp 97.7°F | Ht 61.0 in | Wt 131.0 lb

## 2018-01-22 DIAGNOSIS — R252 Cramp and spasm: Secondary | ICD-10-CM | POA: Diagnosis not present

## 2018-01-22 DIAGNOSIS — J040 Acute laryngitis: Secondary | ICD-10-CM | POA: Diagnosis not present

## 2018-01-22 MED ORDER — DOXYCYCLINE HYCLATE 100 MG PO CAPS: ORAL_CAPSULE | ORAL | 0 refills | Status: DC

## 2018-01-22 MED ORDER — PROMETHAZINE-DM 6.25-15 MG/5ML PO SYRP: 5.0000 mL | ORAL_SOLUTION | Freq: Four times a day (QID) | ORAL | 1 refills | Status: DC | PRN

## 2018-01-22 MED ORDER — PREDNISONE 20 MG PO TABS: ORAL_TABLET | ORAL | 0 refills | Status: DC

## 2018-01-22 MED ORDER — AZITHROMYCIN 250 MG PO TABS: ORAL_TABLET | ORAL | 1 refills | Status: DC

## 2018-01-22 NOTE — Patient Instructions (Signed)
You can hold the doxycycline - take with you and take only if you start coughing thick phlegm up - at this point likely viral and taking an antibiotic will not help.   Remember to push hydration, and voice rest    HOW TO TREAT VIRAL COUGH AND COLD SYMPTOMS:  -Symptoms usually last at least 1 week with the worst symptoms being around day 4.  - colds usually start with a sore throat and end with a cough, and the cough can take 2 weeks to get better.  -No antibiotics are needed for colds, flu, sore throats, cough, bronchitis UNLESS symptoms are longer than 7 days OR if you are getting better then get drastically worse.  -There are a lot of combination medications (Dayquil, Nyquil, Vicks 44, tyelnol cold and sinus, ETC). Please look at the ingredients on the back so that you are treating the correct symptoms and not doubling up on medications/ingredients.    Medicines you can use  Nasal congestion  Little Remedies saline spray (aerosol/mist)- can try this, it is in the kids section - pseudoephedrine (Sudafed)- behind the counter, do not use if you have high blood pressure, medicine that have -D in them.  - phenylephrine (Sudafed PE) -Dextormethorphan + chlorpheniramine (Coridcidin HBP)- okay if you have high blood pressure -Oxymetazoline (Afrin) nasal spray- LIMIT to 3 days -Saline nasal spray -Neti pot (used distilled or bottled water)  Ear pain/congestion  -pseudoephedrine (sudafed) - Nasonex/flonase nasal spray  Fever  -Acetaminophen (Tyelnol) -Ibuprofen (Advil, motrin, aleve)  Sore Throat  -Acetaminophen (Tyelnol) -Ibuprofen (Advil, motrin, aleve) -Drink a lot of water -Gargle with salt water - Rest your voice (don't talk) -Throat sprays -Cough drops  Body Aches  -Acetaminophen (Tyelnol) -Ibuprofen (Advil, motrin, aleve)  Headache  -Acetaminophen (Tyelnol) -Ibuprofen (Advil, motrin, aleve) - Exedrin, Exedrin Migraine  Allergy symptoms (cough, sneeze, runny nose,  itchy eyes) -Claritin or loratadine cheapest but likely the weakest  -Zyrtec or certizine at night because it can make you sleepy -The strongest is allegra or fexafinadine  Cheapest at [employer], [employer], [employer]  Cough  -Dextromethorphan (Delsym)- medicine that has DM in it -Guafenesin (Mucinex/Robitussin) - cough drops - drink lots of water  Chest Congestion  -Guafenesin (Mucinex/Robitussin)  Red Itchy Eyes  - Naphcon-A  Upset Stomach  - Bland diet (nothing spicy, greasy, fried, and high acid foods like tomatoes, oranges, berries) -OKAY- cereal, bread, soup, crackers, rice -Eat smaller more frequent meals -reduce caffeine, no alcohol -Loperamide (Imodium-AD) if diarrhea -Prevacid for heart burn  General health when sick  -Hydration -wash your hands frequently -keep surfaces clean -change pillow cases and sheets often -Get fresh air but do not exercise strenuously -Vitamin D, double up on it - Vitamin C -Zinc

## 2018-01-22 NOTE — Progress Notes (Signed)
Assessment and Plan:  Monique Thomas was seen today for cough, laryngitis and wheezing.  Diagnoses and all orders for this visit:  Acute laryngitis Likely viral - Discussed the importance of avoiding unnecessary antibiotic therapy. Suggested symptomatic OTC remedies. Nasal saline spray for congestion. Nasal steroids, allergy pill, oral steroids Follow up as needed. -     predniSONE (DELTASONE) 20 MG tablet; 2 tablets daily for 3 days, 1 tablet daily for 4 days. -     promethazine-dextromethorphan (PROMETHAZINE-DM) 6.25-15 MG/5ML syrup; Take 5 mLs by mouth 4 (four) times daily as needed for cough.  Fill and take only if cough becoming progressive and productive:  -     doxycycline (VIBRAMYCIN) 100 MG capsule; Take 1 capsule 2 x/day with food for 5 days -  then 1 x/day with food for 10 days  Leg cramping -     Magnesium -     BASIC METABOLIC PANEL WITH GFR  Further disposition pending results of labs. Discussed med's effects and SE's.   Over 15 minutes of exam, counseling, chart review, and critical decision making was performed.   Future Appointments  Date Time Provider [place]  02/06/2018 10:00 AM Liane Comber, NP [facility] None  05/09/2018 10:00 AM Vicie Mutters, PA-C [facility] None    ------------------------------------------------------------------------------------------------------------------   HPI BP 114/74   Pulse 86   Temp 97.7 F (36.5 C)   Ht 5\' 1"  (1.549 m)   Wt 131 lb (59.4 kg)   SpO2 98%   BMI 24.75 kg/m   67 y.o.female presents for nonproductive cough, runny nose, full sensation in ears, hoarseness, voice loss x 2 days ago. She is worried as she is getting married this coming weekend and is worried she won't be able to say her vows. Denies HA, fever/chills, blurry vision, chest pain, palpitations, body aches, rash, n/v/d. Denies recent reflux/chest burning.   She is taking daily allergy pill - zyrtec daily.  She is also c/o bilateral leg  cramping for the past week, reports in the past this has been associated with low magnesium despite supplementation and would like this checked today.   Past Medical History:  Diagnosis Date  . Anemia   . Anxiety   . Arthritis   . Back pain, chronic   . Depression   . GERD (gastroesophageal reflux disease)   . Hyperlipidemia   . Hypothyroidism   . Kidney stone    2015  . Thyroid disease   . Umbilical hernia   . Unspecified vitamin D deficiency      Allergies  Allergen Reactions  . Celebrex [Celecoxib]     "panic attack"  . Other     Skin bruises very easily and peels back  . Topamax [Topiramate]     MEMORY  . Entex Lq [Phenylephrine-Guaifenesin] Rash  . Fiorinal [Butalbital-Aspirin-Caffeine] Rash    Current Outpatient Medications on File Prior to Visit  Medication Sig  . ALPRAZolam (XANAX) 1 MG tablet Take 1/2 to 1 tablet 2 to 3 x / day ONLY if needed for Anxiety Attack & please try to limit to 5 days /week to avoid addiction  . cholecalciferol (VITAMIN D) 1000 UNITS tablet Take 3,000 Units by mouth daily.   . Cyanocobalamin (B-12 PO) Take 1 tablet by mouth daily.  Marland Kitchen estradiol (ESTRACE) 1 MG tablet Take 1 mg by mouth daily.  . Magnesium 500 MG CAPS Take 1,000 mg by mouth daily with lunch.   Marland Kitchen rOPINIRole (REQUIP) 1 MG tablet TAKE 1/2 TO 1 TABLET BY  MOUTH TWO TO THREE TIMES DAILY FOR RESTLESS LEGS  . SYNTHROID 75 MCG tablet Take 17mcg by mouth daily  . cyclobenzaprine (FLEXERIL) 5 MG tablet Take 1 tablet (5 mg total) by mouth 3 (three) times daily as needed for muscle spasms. (Patient not taking: Reported on 01/22/2018)   No current facility-administered medications on file prior to visit.     ROS: Review of Systems  Constitutional: Negative for chills, diaphoresis, fever and malaise/fatigue.  HENT: Positive for congestion and sore throat. Negative for ear discharge, ear pain, hearing loss, sinus pain and tinnitus.   Eyes: Negative for blurred vision, pain, discharge and  redness.  Respiratory: Negative for cough, hemoptysis, sputum production, shortness of breath, wheezing and stridor.   Cardiovascular: Negative for chest pain, palpitations and orthopnea.  Gastrointestinal: Negative for abdominal pain, diarrhea, nausea and vomiting.  Genitourinary: Negative.   Musculoskeletal: Negative for joint pain and myalgias.  Skin: Negative for rash.  Neurological: Negative for dizziness, sensory change, weakness and headaches.  Endo/Heme/Allergies: Positive for environmental allergies.  Psychiatric/Behavioral: Negative.   All other systems reviewed and are negative.   Physical Exam:  BP 114/74   Pulse 86   Temp 97.7 F (36.5 C)   Ht 5\' 1"  (1.549 m)   Wt 131 lb (59.4 kg)   SpO2 98%   BMI 24.75 kg/m   General Appearance: Well nourished, in no apparent distress. Eyes: PERRLA, EOMs, conjunctiva no swelling or erythema Sinuses: No Frontal/maxillary tenderness ENT/Mouth: Ext aud canals clear, TMs without erythema, bulging. No erythema, swelling, or exudate on post pharynx.  Tonsils not swollen or erythematous. Hearing normal.  Neck: Supple, thyroid normal.  Respiratory: Respiratory effort normal, BS equal bilaterally without rales, rhonchi, wheezing or stridor.  Cardio: RRR with no MRGs. Brisk peripheral pulses without edema.  Abdomen: Soft, + BS.  Non tender, no guarding, rebound, hernias, masses. Lymphatics: Non tender without lymphadenopathy.  Musculoskeletal: Symmetrical strength, normal gait.  Skin: Warm, dry without rashes, lesions, ecchymosis.   Psych: Awake and oriented X 3, normal affect, Insight and Judgment appropriate.    Izora Ribas, NP 4:04 PM [facility]

## 2018-01-23 LAB — BASIC METABOLIC PANEL WITH GFR
BUN: 11 mg/dL (ref 7–25)
CALCIUM: 9.3 mg/dL (ref 8.6–10.4)
CO2: 30 mmol/L (ref 20–32)
Chloride: 105 mmol/L (ref 98–110)
Creat: 0.64 mg/dL (ref 0.50–0.99)
GFR, EST AFRICAN AMERICAN: 107 mL/min/{1.73_m2} (ref >=60)
GFR, EST NON AFRICAN AMERICAN: 92 mL/min/{1.73_m2} (ref >=60)
Glucose, Bld: 96 mg/dL (ref 65–99)
Potassium: 4 mmol/L (ref 3.5–5.3)
SODIUM: 139 mmol/L (ref 135–146)

## 2018-01-23 LAB — MAGNESIUM: Magnesium: 1.8 mg/dL (ref 1.5–2.5)

## 2018-02-04 NOTE — Progress Notes (Signed)
MEDICARE ANNUAL WELLNESS VISIT AND FOLLOW UP  Assessment:   Diagnoses and all orders for this visit:   Encounter for Medicare annual wellness exam  Gastroesophageal reflux disease with esophagitis Well managed on current medications Discussed diet, avoiding triggers and other lifestyle changes  Hypothyroidism, unspecified type continue medications the same pending lab results reminded to take on an empty stomach 30-31mins before food.  check TSH level  Vitamin D deficiency Continue supplementation Check vitamin D level  S/P lumbar spinal fusion Continue follow up   Other abnormal glucose Recent A1Cs at goal Discussed diet/exercise, weight management  Defer A1C; check BMP  Medication management CBC, CMP/GFR  Mixed hyperlipidemia At goal by lifestyle modification Continue low cholesterol diet and exercise.  Defer lipid panel.   Chronic pain syndrome Continue follow up  Chronic back pain, unspecified back location, unspecified back pain laterality Continue follow up  Anxiety Well managed by current regimen; reminded to avoid daily use of benzo to limit tolerance/addiction, will increase requip and recommend she try to cut down Stress management techniques discussed, increase water, good sleep hygiene discussed, increase exercise, and increase veggies.   Anemia, unspecified type CBC  Restless legs Increase requip to 3 mg 1-2 times at night Continue with lifestyle, walking, stretching - labs normal  Over 40 minutes of exam, counseling, chart review and critical decision making was performed Future Appointments  Date Time Provider [place]  05/09/2018 10:00 AM Vicie Mutters, PA-C [facility] None     Plan:   During the course of the visit the patient was educated and counseled about appropriate screening and preventive services including:    Pneumococcal vaccine   Prevnar 13  Influenza vaccine  Td vaccine  Screening  electrocardiogram  Bone densitometry screening  Colorectal cancer screening  Diabetes screening  Glaucoma screening  Nutrition counseling   Advanced directives: requested   Subjective:  Monique Thomas is a 67 y.o. female who presents for Medicare Annual Wellness Visit and 3 month follow up. She is followed by [facility]. She is concerned today with RLS which are still bothering her at night - having difficulty falling asleep.   she has a diagnosis of anxiety and is currently on xanax 1 mg, reports symptoms are well controlled on current regimen. she currently takes 1 tab at night to help with sleep/restless legs.   BMI is Body mass index is 25.02 kg/m., she has been working on diet and exercise. Wt Readings from Last 3 Encounters:  02/06/18 132 lb 6.4 oz (60.1 kg)  01/22/18 131 lb (59.4 kg)  11/07/17 130 lb 6.4 oz (59.1 kg)   Today their BP is BP: 116/74 She does workout. She denies chest pain, shortness of breath, dizziness.   She is not on cholesterol medication and denies myalgias. Her cholesterol is at goal. The cholesterol last visit was:   Lab Results  Component Value Date   CHOL 190 11/07/2017   HDL 73 11/07/2017   LDLCALC 93 11/07/2017   TRIG 147 11/07/2017   CHOLHDL 2.6 11/07/2017    She has been working on diet and exercise for glucose management, and denies foot ulcerations, increased appetite, nausea, paresthesia of the feet, polydipsia, polyuria, visual disturbances, vomiting and weight loss. Last A1C in the office was:  Lab Results  Component Value Date   HGBA1C 5.2 11/07/2017   She is on thyroid medication. Her medication was not changed last visit.   Lab Results  Component Value Date   TSH 2.07 11/07/2017  Last GFR: Lab Results  Component Value Date   GFRNONAA 92 01/22/2018   Patient is on Vitamin D supplement   Lab Results  Component Value Date   VD25OH 33 11/07/2017      Medication Review: Current Outpatient Medications on File  Prior to Visit  Medication Sig Dispense Refill  . ALPRAZolam (XANAX) 1 MG tablet Take 1/2 to 1 tablet 2 to 3 x / day ONLY if needed for Anxiety Attack & please try to limit to 5 days /week to avoid addiction 90 tablet 0  . Cholecalciferol 5000 units TABS Take 1 tablet by mouth daily.    . Cyanocobalamin (B-12 PO) Take 1 tablet by mouth daily.    Marland Kitchen estradiol (ESTRACE) 1 MG tablet Take 1 mg by mouth daily.    . Magnesium 500 MG CAPS Take 1,000 mg by mouth daily with lunch.     . SYNTHROID 75 MCG tablet Take 68mcg by mouth daily  2   No current facility-administered medications on file prior to visit.     Allergies  Allergen Reactions  . Celebrex [Celecoxib]     "panic attack"  . Other     Skin bruises very easily and peels back  . Topamax [Topiramate]     MEMORY  . Entex Lq [Phenylephrine-Guaifenesin] Rash  . Fiorinal [Butalbital-Aspirin-Caffeine] Rash    Current Problems (verified) Patient Active Problem List   Diagnosis Date Noted  . S/P lumbar spinal fusion 04/16/2015  . Chronic pain syndrome 03/27/2015  . Medication management 01/15/2014  . Other abnormal glucose 01/15/2014  . Back pain, chronic   . Hyperlipidemia   . Hypothyroidism   . GERD (gastroesophageal reflux disease)   . Anemia   . Anxiety   . Vitamin D deficiency     Screening Tests Immunization History  Administered Date(s) Administered  . Influenza Split 06/29/2015  . Influenza Whole 05/21/2013  . Influenza, High Dose Seasonal PF 05/30/2016, 06/13/2017  . PPD Test 09/23/2013, 10/01/2014, 02/26/2016  . Pneumococcal Conjugate-13 05/03/2017  . Pneumococcal Polysaccharide-23 09/23/2013  . Td 09/19/2005  . Tdap 10/07/2015  . Zoster 03/27/2014   Preventative care: Last colonoscopy: 2008  Cologuard: 04/2017 neg Last mammogram: 07/2017 [facility] Last pap smear/pelvic exam: 09/2017 GYN normal, [place]  DEXA: 2018  Prior vaccinations: TD or Tdap: 2017  Influenza: 2018 Pneumococcal: 2015 Prevnar13:  2018 Shingles/Zostavax: 2015  Names of Other Physician/Practitioners you currently use: 1. [facility] here for primary care 2. Dr. Theodis Sato. [place], eye doctor, last visit 2019 3. Dr. Toy Cookey, dentist, q 6 months, last visit 12/2017  Patient Care Team: [name], MD as PCP - General (Internal Medicine) Darlis Loan, MD as Referring Physician (Gastroenterology) Vania Rea, MD as Consulting Physician (Obstetrics and Gynecology) Carolan Clines, MD as Consulting Physician (Urology) Zara Council as Physician Assistant (Orthopedic Surgery) Lavonna Monarch, MD as Consulting Physician (Dermatology) Gatha Mayer, MD as Consulting Physician (Gastroenterology) Webb Laws, [place] as Referring Physician (Optometry)  SURGICAL HISTORY She  has a past surgical history that includes Abdominal hysterectomy; Cesarean section; Hernia repair; Appendectomy; Tonsillectomy and adenoidectomy; Spine surgery; Laparoscopic lysis intestinal adhesions; Maximum access (mas)posterior lumbar interbody fusion (plif) 1 level (N/A, 04/16/2015); and Anterior lat lumbar fusion (N/A, 04/07/2017). FAMILY HISTORY Her family history includes Asthma in her sister; Cancer in her maternal grandmother; Heart disease in her mother; Hyperlipidemia in her mother; Thyroid disease in her mother. SOCIAL HISTORY She  reports that she has never smoked. She has never used smokeless  tobacco. She reports that she does not drink alcohol or use drugs.   MEDICARE WELLNESS OBJECTIVES: Physical activity: Current Exercise Habits: Home exercise routine, Type of exercise: walking, Time (Minutes): 30, Frequency (Times/Week): 7, Weekly Exercise (Minutes/Week): 210, Intensity: Mild, Exercise limited by: None identified Cardiac risk factors: Cardiac Risk Factors include: advanced age (>65men, >28 women) Depression/mood screen:   Depression screen [facility] 2/9 02/06/2018  Decreased Interest 0   Down, Depressed, Hopeless 0  PHQ - 2 Score 0    ADLs:  In your present state of health, do you have any difficulty performing the following activities: 02/06/2018 11/07/2017  Hearing? N N  Vision? N N  Difficulty concentrating or making decisions? N N  Walking or climbing stairs? N N  Comment - -  Dressing or bathing? N N  Doing errands, shopping? N N  Some recent data might be hidden     Cognitive Testing  Alert? Yes  Normal Appearance?Yes  Oriented to person? Yes  Place? Yes   Time? Yes  Recall of three objects?  Yes  Can perform simple calculations? Yes  Displays appropriate judgment?Yes  Can read the correct time from a watch face?Yes  EOL planning: Does Patient Have a Medical Advance Directive?: No Does patient want to make changes to medical advance directive?: No - Patient declined Would patient like information on creating a medical advance directive?: No - Patient declined  Review of Systems  Constitutional: Negative for malaise/fatigue and weight loss.  HENT: Negative for hearing loss and tinnitus.   Eyes: Negative for blurred vision and double vision.  Respiratory: Negative for cough, sputum production, shortness of breath and wheezing.   Cardiovascular: Negative for chest pain, palpitations, orthopnea, claudication, leg swelling and PND.  Gastrointestinal: Negative for abdominal pain, blood in stool, constipation, diarrhea, heartburn, melena, nausea and vomiting.  Genitourinary: Negative.   Musculoskeletal: Negative for falls, joint pain and myalgias.  Skin: Negative for rash.  Neurological: Negative for dizziness, tingling, sensory change, weakness and headaches.  Endo/Heme/Allergies: Negative for polydipsia.  Psychiatric/Behavioral: Negative for depression, memory loss, substance abuse and suicidal ideas. The patient has insomnia. The patient is not nervous/anxious.   All other systems reviewed and are negative.    Objective:     Today's Vitals   02/06/18  0958  BP: 116/74  Pulse: 68  Resp: 14  Temp: 97.7 F (36.5 C)  SpO2: 99%  Weight: 132 lb 6.4 oz (60.1 kg)  Height: 5\' 1"  (1.549 m)  PainSc: 6    Body mass index is 25.02 kg/m.  General appearance: alert, no distress, WD/WN, female HEENT: normocephalic, sclerae anicteric, TMs pearly, nares patent, no discharge or erythema, pharynx normal Oral cavity: MMM, no lesions Neck: supple, no lymphadenopathy, no thyromegaly, no masses Heart: RRR, normal S1, S2, no murmurs Lungs: CTA bilaterally, no wheezes, rhonchi, or rales Abdomen: +bs, soft, non tender, non distended, no masses, no hepatomegaly, no splenomegaly Musculoskeletal: nontender, no swelling, no obvious deformity Extremities: no edema, no cyanosis, no clubbing Pulses: 2+ symmetric, upper and lower extremities, normal cap refill Neurological: alert, oriented x 3, CN2-12 intact, strength normal upper extremities and lower extremities, sensation normal throughout, DTRs 2+ throughout, no cerebellar signs, gait normal Psychiatric: normal affect, behavior normal, pleasant   Medicare Attestation I have personally reviewed: The patient's medical and social history Their use of alcohol, tobacco or illicit drugs Their current medications and supplements The patient's functional ability including ADLs,fall risks, home safety risks, cognitive, and hearing and visual impairment Diet and physical  activities Evidence for depression or mood disorders  The patient's weight, height, BMI, and visual acuity have been recorded in the chart.  I have made referrals, counseling, and provided education to the patient based on review of the above and I have provided the patient with a written personalized care plan for preventive services.     Izora Ribas, NP   02/06/2018

## 2018-02-06 ENCOUNTER — Encounter: Payer: Self-pay | Admitting: Adult Health

## 2018-02-06 ENCOUNTER — Ambulatory Visit: Payer: Medicare Other | Admitting: Adult Health

## 2018-02-06 VITALS — BP 116/74 | HR 68 | Temp 97.7°F | Resp 14 | Ht 61.0 in | Wt 132.4 lb

## 2018-02-06 DIAGNOSIS — K2100 Gastro-esophageal reflux disease with esophagitis, without bleeding: Secondary | ICD-10-CM

## 2018-02-06 DIAGNOSIS — F419 Anxiety disorder, unspecified: Secondary | ICD-10-CM

## 2018-02-06 DIAGNOSIS — M549 Dorsalgia, unspecified: Secondary | ICD-10-CM | POA: Diagnosis not present

## 2018-02-06 DIAGNOSIS — E559 Vitamin D deficiency, unspecified: Secondary | ICD-10-CM | POA: Diagnosis not present

## 2018-02-06 DIAGNOSIS — G8929 Other chronic pain: Secondary | ICD-10-CM

## 2018-02-06 DIAGNOSIS — Z981 Arthrodesis status: Secondary | ICD-10-CM

## 2018-02-06 DIAGNOSIS — E782 Mixed hyperlipidemia: Secondary | ICD-10-CM | POA: Diagnosis not present

## 2018-02-06 DIAGNOSIS — D649 Anemia, unspecified: Secondary | ICD-10-CM | POA: Diagnosis not present

## 2018-02-06 DIAGNOSIS — G894 Chronic pain syndrome: Secondary | ICD-10-CM

## 2018-02-06 DIAGNOSIS — E039 Hypothyroidism, unspecified: Secondary | ICD-10-CM

## 2018-02-06 DIAGNOSIS — R6889 Other general symptoms and signs: Secondary | ICD-10-CM

## 2018-02-06 DIAGNOSIS — Z0000 Encounter for general adult medical examination without abnormal findings: Secondary | ICD-10-CM

## 2018-02-06 DIAGNOSIS — Z0001 Encounter for general adult medical examination with abnormal findings: Secondary | ICD-10-CM | POA: Diagnosis not present

## 2018-02-06 DIAGNOSIS — R7309 Other abnormal glucose: Secondary | ICD-10-CM

## 2018-02-06 DIAGNOSIS — Z79899 Other long term (current) drug therapy: Secondary | ICD-10-CM

## 2018-02-06 DIAGNOSIS — G2581 Restless legs syndrome: Secondary | ICD-10-CM

## 2018-02-06 LAB — COMPLETE METABOLIC PANEL WITH GFR
AG Ratio: 1.6 (calc) (ref 1.0–2.5)
ALKALINE PHOSPHATASE (APISO): 58 U/L (ref 33–130)
ALT: 12 U/L (ref 6–29)
AST: 21 U/L (ref 10–35)
Albumin: 4.1 g/dL (ref 3.6–5.1)
BILIRUBIN TOTAL: 0.5 mg/dL (ref 0.2–1.2)
BUN: 12 mg/dL (ref 7–25)
CHLORIDE: 107 mmol/L (ref 98–110)
CO2: 24 mmol/L (ref 20–32)
Calcium: 9.4 mg/dL (ref 8.6–10.4)
Creat: 0.59 mg/dL (ref 0.50–0.99)
GFR, Est African American: 110 mL/min/{1.73_m2} (ref >=60)
GFR, Est Non African American: 95 mL/min/{1.73_m2} (ref >=60)
GLUCOSE: 87 mg/dL (ref 65–99)
Globulin: 2.5 g/dL (calc) (ref 1.9–3.7)
POTASSIUM: 4.2 mmol/L (ref 3.5–5.3)
SODIUM: 140 mmol/L (ref 135–146)
Total Protein: 6.6 g/dL (ref 6.1–8.1)

## 2018-02-06 LAB — CBC WITH DIFFERENTIAL/PLATELET
Basophils Absolute: 48 cells/uL (ref 0–200)
Basophils Relative: 1.1 %
EOS PCT: 3.6 %
Eosinophils Absolute: 158 cells/uL (ref 15–500)
HEMATOCRIT: 34.6 % — AB (ref 35.0–45.0)
HEMOGLOBIN: 11.9 g/dL (ref 11.7–15.5)
Lymphs Abs: 1584 cells/uL (ref 850–3900)
MCH: 30.2 pg (ref 27.0–33.0)
MCHC: 34.4 g/dL (ref 32.0–36.0)
MCV: 87.8 fL (ref 80.0–100.0)
MONOS PCT: 9.9 %
MPV: 8.5 fL (ref 7.5–12.5)
NEUTROS ABS: 2174 {cells}/uL (ref 1500–7800)
Neutrophils Relative %: 49.4 %
Platelets: 331 10*3/uL (ref 140–400)
RBC: 3.94 10*6/uL (ref 3.80–5.10)
RDW: 12.4 % (ref 11.0–15.0)
Total Lymphocyte: 36 %
WBC mixed population: 436 cells/uL (ref 200–950)
WBC: 4.4 10*3/uL (ref 3.8–10.8)

## 2018-02-06 LAB — TSH: TSH: 11.99 mIU/L — ABNORMAL HIGH (ref 0.40–4.50)

## 2018-02-06 MED ORDER — ROPINIROLE HCL 3 MG PO TABS: ORAL_TABLET | ORAL | 2 refills | Status: DC

## 2018-02-06 MED ORDER — CYCLOBENZAPRINE HCL 5 MG PO TABS: 5.0000 mg | ORAL_TABLET | Freq: Three times a day (TID) | ORAL | 0 refills | Status: DC | PRN

## 2018-02-06 NOTE — Patient Instructions (Signed)
Start by trying 3 mg of requip 1 hour prior to bedtime - if not quite enough, could take another 3 mg tab earlier with dinner.     Restless Legs Syndrome Restless legs syndrome is a condition that causes uncomfortable feelings or sensations in the legs, especially while sitting or lying down. The sensations usually cause an overwhelming urge to move the legs. The arms can also sometimes be affected. The condition can range from mild to severe. The symptoms often interfere with a person's ability to sleep. What are the causes? The cause of this condition is not known. What increases the risk? This condition is more likely to develop in:  People who are older than age 95.  Pregnant women. In general, restless legs syndrome is more common in women than in men.  People who have a family history of the condition.  People who have certain medical conditions, such as iron deficiency, kidney disease, Parkinson disease, or nerve damage.  People who take certain medicines, such as medicines for high blood pressure, nausea, colds, allergies, depression, and some heart conditions.  What are the signs or symptoms? The main symptom of this condition is uncomfortable sensations in the legs. These sensations may be:  Described as pulling, tingling, prickling, throbbing, crawling, or burning.  Worse while you are sitting or lying down.  Worse during periods of rest or inactivity.  Worse at night, often interfering with your sleep.  Accompanied by a very strong urge to move your legs.  Temporarily relieved by movement of your legs.  The sensations usually affect both sides of the body. The arms can also be affected, but this is rare. People who have this condition often have tiredness during the day because of their lack of sleep at night. How is this diagnosed? This condition may be diagnosed based on your description of the symptoms. You may also have tests, including blood tests, to check for  other conditions that may lead to your symptoms. In some cases, you may be asked to spend some time in a sleep lab so your sleeping can be monitored. How is this treated? Treatment for this condition is focused on managing the symptoms. Treatment may include:  Self-help and lifestyle changes.  Medicines.  Follow these instructions at home:  Take medicines only as directed by your health care provider.  Try these methods to get temporary relief from the uncomfortable sensations: ? Massage your legs. ? Walk or stretch. ? Take a cold or hot bath.  Practice good sleep habits. For example, go to bed and get up at the same time every day.  Exercise regularly.  Practice ways of relaxing, such as yoga or meditation.  Avoid caffeine and alcohol.  Do not use any tobacco products, including cigarettes, chewing tobacco, or electronic cigarettes. If you need help quitting, ask your health care provider.  Keep all follow-up visits as directed by your health care provider. This is important. Contact a health care provider if: Your symptoms do not improve with treatment, or they get worse. This information is not intended to replace advice given to you by your health care provider. Make sure you discuss any questions you have with your health care provider. Document Released: 08/26/2002 Document Revised: 02/11/2016 Document Reviewed: 09/01/2014 Elsevier Interactive Patient Education  2018 [employer].   Ropinirole tablets What is this medicine? ROPINIROLE (roe PIN i role) is used to treat the symptoms of Parkinson's disease. It helps to improve muscle control and movement difficulties. It  is also used for the treatment of Restless Legs Syndrome. This medicine may be used for other purposes; ask your health care provider or pharmacist if you have questions. COMMON BRAND NAME(S): Requip What should I tell my health care provider before I take this medicine? They need to know if you have any  of these conditions: -dizzy or fainting spells -heart disease -high blood pressure -kidney disease -liver disease -low blood pressure -sleeping problems -an unusual or allergic reaction to ropinirole, other medicines, foods, dyes, or preservatives -pregnant or trying to get pregnant -breast-feeding How should I use this medicine? Take this medicine by mouth with a glass of water. Follow the directions on the prescription label. You can take it with or without food. If it upsets your stomach, take it with food. Take your doses at regular intervals. Do not take your medicine more often than directed. Do not stop taking this medicine except on your doctor's advice. Stopping this medicine too quickly may cause serious side effects. Talk to your pediatrician regarding the use of this medicine in children. Special care may be needed. Overdosage: If you think you have taken too much of this medicine contact a poison control center or emergency room at once. NOTE: This medicine is only for you. Do not share this medicine with others. What if I miss a dose? If you miss a dose, take it as soon as you can. If it is almost time for your next dose, take only that dose. Do not take double or extra doses. What may interact with this medicine? -ciprofloxacin -female hormones, like estrogens and birth control pills -medicines for depression, anxiety, or psychotic disturbances -metoclopramide -mexiletine -norfloxacin -omeprazole This list may not describe all possible interactions. Give your health care provider a list of all the medicines, herbs, non-prescription drugs, or dietary supplements you use. Also tell them if you smoke, drink alcohol, or use illegal drugs. Some items may interact with your medicine. What should I watch for while using this medicine? Visit your doctor or health care professional for regular checks on your progress. It may be several weeks or months before you feel the full effect  of this medicine. You may get drowsy or dizzy. Do not drive, use machinery, or do anything that needs mental alertness until you know how this drug affects you. Do not stand or sit up quickly, especially if you are an older patient. This reduces the risk of dizzy or fainting spells. Alcohol can increase possible dizziness. Avoid alcoholic drinks. If you find that you have sudden feelings of wanting to sleep during normal activities, like cooking, watching television, or while driving or riding in a car, you should contact your health care professional. Your mouth may get dry. Chewing sugarless gum or sucking hard candy, and drinking plenty of water may help. Contact your doctor if the problem does not go away or is severe. There have been reports of increased sexual urges or other strong urges such as gambling while taking some medicines for Parkinson's disease. If you experience any of these urges while taking this medicine, you should report it to your health care provider as soon as possible. You should check your skin often for changes to moles and new growths while taking this medicine. Call your doctor if you notice any of these changes. What side effects may I notice from receiving this medicine? Side effects that you should report to your doctor or health care professional as soon as possible: -allergic reactions like  skin rash, itching or hives, swelling of the face, lips, or tongue -changes in vision -chest pain -confusion -falling asleep during normal activities like driving -fast, irregular heartbeat -feeling faint or lightheaded, falls -hallucination, loss of contact with reality -joint or muscle pain -loss of bladder control -loss of memory -new or increased gambling urges, sexual urges, uncontrolled spending, binge or compulsive eating, or other urges -pain, tingling, numbness in the hands or feet -shortness of breath, troubled breathing, tightness in chest, or wheezing -signs and  symptoms of low blood pressure like dizziness; feeling faint or lightheaded, falls; unusually weak or tired -swelling of the ankles, feet, hands -uncontrollable head, mouth, neck, arm, or leg movements -vomiting Side effects that usually do not require medical attention (report to your doctor or health care professional if they continue or are bothersome): -dizziness -drowsiness -headache -increased sweating -nausea -tremors This list may not describe all possible side effects. Call your doctor for medical advice about side effects. You may report side effects to FDA at 1-800-FDA-1088. Where should I keep my medicine? Keep out of the reach of children. Store at room temperature between 20 and 25 degrees C (68 and 77 degrees F). Protect from light and moisture. Keep container tightly closed. Throw away any unused medicine after the expiration date. NOTE: This sheet is a summary. It may not cover all possible information. If you have questions about this medicine, talk to your doctor, pharmacist, or health care provider.  2018 Elsevier/Gold Standard (2016-02-22 10:58:05)

## 2018-02-07 ENCOUNTER — Other Ambulatory Visit: Payer: Self-pay | Admitting: Adult Health

## 2018-02-07 DIAGNOSIS — E039 Hypothyroidism, unspecified: Secondary | ICD-10-CM

## 2018-02-27 ENCOUNTER — Encounter: Payer: Self-pay | Admitting: Adult Health

## 2018-02-27 ENCOUNTER — Ambulatory Visit: Payer: Medicare Other | Admitting: Adult Health

## 2018-02-27 VITALS — BP 110/64 | HR 70 | Temp 97.3°F | Ht 61.0 in | Wt 133.0 lb

## 2018-02-27 DIAGNOSIS — R058 Other specified cough: Secondary | ICD-10-CM

## 2018-02-27 DIAGNOSIS — R238 Other skin changes: Secondary | ICD-10-CM | POA: Diagnosis not present

## 2018-02-27 DIAGNOSIS — R05 Cough: Secondary | ICD-10-CM | POA: Diagnosis not present

## 2018-02-27 MED ORDER — PREDNISONE 20 MG PO TABS: ORAL_TABLET | ORAL | 0 refills | Status: DC

## 2018-02-27 MED ORDER — HYDROCODONE-HOMATROPINE 5-1.5 MG/5ML PO SYRP: 5.0000 mL | ORAL_SOLUTION | Freq: Four times a day (QID) | ORAL | 0 refills | Status: DC | PRN

## 2018-02-27 NOTE — Patient Instructions (Signed)
Rash A rash is a change in the color of the skin. A rash can also change the way your skin feels. There are many different conditions and factors that can cause a rash. Follow these instructions at home: Pay attention to any changes in your symptoms. Follow these instructions to help with your condition: Medicine Take or apply over-the-counter and prescription medicines only as told by your health care provider. These may include:  Corticosteroid cream.  Anti-itch lotions.  Oral antihistamines.  Skin Care  Apply cool compresses to the affected areas.  Try taking a bath with: ? Epsom salts. Follow the instructions on the packaging. You can get these at your local pharmacy or grocery store. ? Baking soda. Pour a small amount into the bath as told by your health care provider. ? Colloidal oatmeal. Follow the instructions on the packaging. You can get this at your local pharmacy or grocery store.  Try applying baking soda paste to your skin. Stir water into baking soda until it reaches a paste-like consistency.  Do not scratch or rub your skin.  Avoid covering the rash. Make sure the rash is exposed to air as much as possible. General instructions  Avoid hot showers or baths, which can make itching worse. A cold shower may help.  Avoid scented soaps, detergents, and perfumes. Use gentle soaps, detergents, perfumes, and other cosmetic products.  Avoid any substance that causes your rash. Keep a journal to help track what causes your rash. Write down: ? What you eat. ? What cosmetic products you use. ? What you drink. ? What you wear. This includes jewelry.  Keep all follow-up visits as told by your health care provider. This is important. Contact a health care provider if:  You sweat at night.  You lose weight.  You urinate more than normal.  You feel weak.  You vomit.  Your skin or the whites of your eyes look yellow (jaundice).  Your skin: ? Tingles. ? Is  numb.  Your rash: ? Does not go away after several days. ? Gets worse.  You are: ? Unusually thirsty. ? More tired than normal.  You have: ? New symptoms. ? Pain in your abdomen. ? A fever. ? Diarrhea. Get help right away if:  You develop a rash that covers all or most of your body. The rash may or may not be painful.  You develop blisters that: ? Are on top of the rash. ? Grow larger or grow together. ? Are painful. ? Are inside your nose or mouth.  You develop a rash that: ? Looks like purple pinprick-sized spots all over your body. ? Has a "bull's eye" or looks like a target. ? Is not related to sun exposure, is red and painful, and causes your skin to peel. This information is not intended to replace advice given to you by your health care provider. Make sure you discuss any questions you have with your health care provider. Document Released: 08/26/2002 Document Revised: 02/09/2016 Document Reviewed: 01/21/2015 Elsevier Interactive Patient Education  2018 Elsevier Inc.  

## 2018-02-27 NOTE — Progress Notes (Signed)
Assessment and Plan:  Naveen was seen today for blister.  Diagnoses and all orders for this visit:  Vesicular rash Suspect very mild contact dermatitis; non-specific; low suspicion for infectious etiology Will trial prednisone which may also help with her cough Patient to call if not resolving in 2-3 days or with any new symptoms -     predniSONE (DELTASONE) 20 MG tablet; 2 tablets daily for 3 days, 1 tablet daily for 4 days.  Post-viral cough syndrome Discussed rotating allergy agent, voice rest Hycodan at night, discussed should not take concurrently with xanax or with alcohol due to sedation -     HYDROcodone-homatropine (HYCODAN) 5-1.5 MG/5ML syrup; Take 5 mLs by mouth every 6 (six) hours as needed for cough.  Further disposition pending results of labs. Discussed med's effects and SE's.   Over 15 minutes of exam, counseling, chart review, and critical decision making was performed.   Future Appointments  Date Time Provider [place]  03/09/2018 10:00 AM [facility] NURSE [facility] None  05/09/2018 10:00 AM Vicie Mutters, PA-C [facility] None  02/19/2019 10:45 AM Liane Comber, NP [facility] None    ------------------------------------------------------------------------------------------------------------------   HPI BP 110/64   Pulse 70   Temp (!) 97.3 F (36.3 C)   Ht 5\' 1"  (1.549 m)   Wt 133 lb (60.3 kg)   SpO2 95%   BMI 25.13 kg/m   67 y.o.female presents for evaluation of a new vesicular rash; she reports a single lesion to her left knee last night, then work up with rare scattered mildly pruritic vesicular lesions to Bilateral ateral lower legs sparing her soles. She reports since this AM they do appear to be improving. She reports she worked in her yard 3 days ago, but otherwise denies new activities, denies new detergents, lotions, etc. She has applied mineral oil to her legs today.   She also mentions ongoing cough from recent URI ~4 weeks ago, has  been using promethazine DM but cough poorly controlled at night and limiting sleep. She continues with daily allergy medication.   Past Medical History:  Diagnosis Date  . Anemia   . Anxiety   . Arthritis   . Back pain, chronic   . Depression   . GERD (gastroesophageal reflux disease)   . Hyperlipidemia   . Hypothyroidism   . Kidney stone    2015  . Thyroid disease   . Umbilical hernia   . Unspecified vitamin D deficiency      Allergies  Allergen Reactions  . Celebrex [Celecoxib]     "panic attack"  . Other     Skin bruises very easily and peels back  . Topamax [Topiramate]     MEMORY  . Entex Lq [Phenylephrine-Guaifenesin] Rash  . Fiorinal [Butalbital-Aspirin-Caffeine] Rash    Current Outpatient Medications on File Prior to Visit  Medication Sig  . ALPRAZolam (XANAX) 1 MG tablet Take 1/2 to 1 tablet 2 to 3 x / day ONLY if needed for Anxiety Attack & please try to limit to 5 days /week to avoid addiction  . Cholecalciferol 5000 units TABS Take 1 tablet by mouth daily.  . Cyanocobalamin (B-12 PO) Take 1 tablet by mouth daily.  . cyclobenzaprine (FLEXERIL) 5 MG tablet Take 1 tablet (5 mg total) by mouth 3 (three) times daily as needed for muscle spasms.  Marland Kitchen estradiol (ESTRACE) 1 MG tablet Take 1 mg by mouth daily.  . Magnesium 500 MG CAPS Take 1,000 mg by mouth daily with lunch.   Marland Kitchen rOPINIRole (  REQUIP) 3 MG tablet TAKE 1/2 TO 1 TABLET BY MOUTH TWO TO THREE TIMES DAILY FOR RESTLESS LEGS  . SYNTHROID 75 MCG tablet Take 8mcg by mouth daily   No current facility-administered medications on file prior to visit.     ROS: all negative except above.   Physical Exam:  BP 110/64   Pulse 70   Temp (!) 97.3 F (36.3 C)   Ht 5\' 1"  (1.549 m)   Wt 133 lb (60.3 kg)   SpO2 95%   BMI 25.13 kg/m   General Appearance: Well nourished, in no apparent distress. Eyes: PERRLA, EOMs, conjunctiva no swelling or erythema Sinuses: No Frontal/maxillary tenderness ENT/Mouth: Ext aud  canals clear, TMs without erythema, bulging. No erythema, swelling, or exudate on post pharynx.  Tonsils not swollen or erythematous. Hearing normal.  Neck: Supple, thyroid normal.  Respiratory: Respiratory effort normal, BS equal bilaterally without rales, rhonchi, wheezing or stridor.  Cardio: RRR with no MRGs. Brisk peripheral pulses without edema.  Abdomen: Soft, + BS.  Non tender, no guarding, rebound, hernias, masses. Lymphatics: Non tender without lymphadenopathy.  Musculoskeletal: Symmetrical strength, normal gait.  Skin: Warm, dry; rare scattered small vesicular lesions to bilateral lower legs (5-10 distinct scattered lesions to each extremity with faint erythematous base.  Neuro: Cranial nerves intact. Normal muscle tone, no cerebellar symptoms. Sensation intact.  Psych: Awake and oriented X 3, normal affect, Insight and Judgment appropriate.    Izora Ribas, NP 12:24 PM [facility]

## 2018-03-09 ENCOUNTER — Ambulatory Visit: Payer: Medicare Other

## 2018-03-09 DIAGNOSIS — E039 Hypothyroidism, unspecified: Secondary | ICD-10-CM | POA: Diagnosis not present

## 2018-03-09 LAB — TSH: TSH: 13.25 mIU/L — ABNORMAL HIGH (ref 0.40–4.50)

## 2018-03-09 NOTE — Progress Notes (Signed)
Patient presents to the office for a nurse visit to have a TSH drawn. Patient states that there aren't any changes to her medications and is taking a her thyroid meds 60 minutes before eating or drinking.

## 2018-03-11 ENCOUNTER — Other Ambulatory Visit: Payer: Self-pay | Admitting: Adult Health

## 2018-03-11 DIAGNOSIS — E039 Hypothyroidism, unspecified: Secondary | ICD-10-CM

## 2018-03-20 DIAGNOSIS — M1712 Unilateral primary osteoarthritis, left knee: Secondary | ICD-10-CM | POA: Insufficient documentation

## 2018-04-06 ENCOUNTER — Other Ambulatory Visit: Payer: Medicare Other

## 2018-04-06 DIAGNOSIS — E039 Hypothyroidism, unspecified: Secondary | ICD-10-CM

## 2018-04-06 LAB — TSH: TSH: 4.21 mIU/L (ref 0.40–4.50)

## 2018-04-13 ENCOUNTER — Other Ambulatory Visit: Payer: Self-pay | Admitting: Internal Medicine

## 2018-04-13 DIAGNOSIS — F419 Anxiety disorder, unspecified: Secondary | ICD-10-CM

## 2018-04-13 MED ORDER — ALPRAZOLAM 1 MG PO TABS: ORAL_TABLET | ORAL | 0 refills | Status: DC

## 2018-05-07 DIAGNOSIS — N951 Menopausal and female climacteric states: Secondary | ICD-10-CM | POA: Insufficient documentation

## 2018-05-07 NOTE — Progress Notes (Signed)
COMPLETE PHYSICAL AND FOLLOW UP  Assessment and Plan:   Hypothyroidism, unspecified type Hypothyroidism-check TSH level, continue medications the same, reminded to take on an empty stomach 30-69mins before food.  -     TSH  Mixed hyperlipidemia -continue medications, check lipids, decrease fatty foods, increase activity.  -     Lipid panel  Vitamin D deficiency -     VITAMIN D 25 Hydroxy (Vit-D Deficiency, Fractures)  Medication management -     CBC with Differential/Platelet -     Hepatic function panel -     Magnesium  Anemia, unspecified type -     CBC with Differential/Platelet - Iron  Anxiety  Continue xanax PRN Declines addition med at this time Discussed benzo use and addition given information  Chronic back pain, unspecified back location, unspecified back pain laterality Continue follow up   Chronic pain syndrome Continue follow up   S/P lumbar spinal fusion Continue follow up  Can get Lidoderm patches OTC  Gastroesophageal reflux disease with esophagitis Continue PPI/H2 blocker, diet discussed  Screening for hematuria or proteinuria -     Urinalysis, Routine w reflex microscopic -     Microalbumin / creatinine urine ratio  Encounter for hepatitis C screening test for low risk patient -     Hepatitis C antibody    Over 40 minutes of exam, counseling, chart review and critical decision making was performed Future Appointments  Date Time Provider [place]  02/19/2019 10:45 AM Liane Comber, NP [facility] None  05/15/2019 10:00 AM Vicie Mutters, PA-C [facility] None     Subjective:  Monique Thomas is a 67 y.o. female who presents for CPE and Follow up for depression, thyroid, chol.     Her blood pressure has been controlled at home, today their BP is BP: 120/86 She does not workout. She denies chest pain, shortness of breath, dizziness.  S/p hysterectomy, she is on estrogen daily.  She follows with Dr. Gladstone Lighter for pain, getting knee  evaluated. She has chronic lower back pain, doing well, followed by pain management. Married March.  She is not on cholesterol medication and denies myalgias. Her cholesterol is at goal. The cholesterol last visit was:   Lab Results  Component Value Date   CHOL 190 11/07/2017   HDL 73 11/07/2017   LDLCALC 93 11/07/2017   TRIG 147 11/07/2017   CHOLHDL 2.6 11/07/2017   Lab Results  Component Value Date   HGBA1C 5.2 11/07/2017   Patient is on Vitamin D supplement, has not changed dose.   Lab Results  Component Value Date   VD25OH 33 11/07/2017     She is on thyroid medication. Her medication was changed last visit, she is now on 52mcg day only. Lab Results  Component Value Date   TSH 4.21 04/06/2018  .  BMI is Body mass index is 24.87 kg/m., she is working on diet and exercise. Wt Readings from Last 3 Encounters:  05/09/18 131 lb 9.6 oz (59.7 kg)  02/27/18 133 lb (60.3 kg)  02/06/18 132 lb 6.4 oz (60.1 kg)   She has recurrent UTI's, follows urology and has hx of kidney stones.  She has depression, is on xanax PRN but takes nightly for sleep, declines SSRI medication. She is stressed, son is doing drugs.   Medication Review: Current Outpatient Medications on File Prior to Visit  Medication Sig Dispense Refill  . ALPRAZolam (XANAX) 1 MG tablet Take 1/2 to 1 tablet 2 to 3 x / day ONLY  if needed for Anxiety Attack & please try to limit to 5 days /week to avoid addiction 90 tablet 0  . Cholecalciferol 5000 units TABS Take 1 tablet by mouth daily.    . Cyanocobalamin (B-12 PO) Take 1 tablet by mouth daily.    . cyclobenzaprine (FLEXERIL) 5 MG tablet Take 1 tablet (5 mg total) by mouth 3 (three) times daily as needed for muscle spasms. 90 tablet 0  . estradiol (ESTRACE) 1 MG tablet Take 1 mg by mouth daily.    . Magnesium 500 MG CAPS Take 1,000 mg by mouth daily with lunch.     . SYNTHROID 75 MCG tablet Take 79mcg by mouth daily  2   No current facility-administered medications on  file prior to visit.     Allergies  Allergen Reactions  . Celebrex [Celecoxib]     "panic attack"  . Other     Skin bruises very easily and peels back  . Topamax [Topiramate]     MEMORY  . Entex Lq [Phenylephrine-Guaifenesin] Rash  . Fiorinal [Butalbital-Aspirin-Caffeine] Rash    Current Problems (verified) Patient Active Problem List   Diagnosis Date Noted  . Menopausal symptoms 05/07/2018  . Restless legs 02/06/2018  . S/P lumbar spinal fusion 04/16/2015  . Chronic pain syndrome 03/27/2015  . Medication management 01/15/2014  . Other abnormal glucose 01/15/2014  . Back pain, chronic   . Hyperlipidemia   . Hypothyroidism   . GERD (gastroesophageal reflux disease)   . Anemia   . Anxiety   . Vitamin D deficiency     Screening Tests Immunization History  Administered Date(s) Administered  . Influenza Split 06/29/2015  . Influenza Whole 05/21/2013  . Influenza, High Dose Seasonal PF 05/30/2016, 06/13/2017  . PPD Test 09/23/2013, 10/01/2014, 02/26/2016  . Pneumococcal Conjugate-13 05/03/2017  . Pneumococcal Polysaccharide-23 09/23/2013  . Td 09/19/2005  . Tdap 10/07/2015  . Zoster 03/27/2014   Preventative care: Last colonoscopy: 2008 due this year will get cologuard 05/10/2017 cologuard Last mammogram: 07/2017 [facility] Last pap smear/pelvic exam: 07/2015 GYN normal, [place] QQPY:1950  Prior vaccinations: TD or Tdap: 2017  Influenza: 2018 Pneumococcal: 2015 Prevnar13: 2018 Shingles/Zostavax: 2015  Names of Other Physician/Practitioners you currently use: 1. [facility] here for primary care 2. Dr. Theodis Sato. Talbert Forest, eye doctor, last visit 08/2016 3. Dr. Toy Cookey, dentist, last visit q 6 months Patient Care Team: [name], MD as PCP - General (Internal Medicine) Darlis Loan, MD as Referring Physician (Gastroenterology) Vania Rea, MD as Consulting Physician (Obstetrics and Gynecology) Carolan Clines, MD as Consulting Physician (Urology) Zara Council as Physician Assistant (Orthopedic Surgery) Lavonna Monarch, MD as Consulting Physician (Dermatology) Gatha Mayer, MD as Consulting Physician (Gastroenterology) Webb Laws, [place] as Referring Physician (Optometry)  SURGICAL HISTORY She  has a past surgical history that includes Abdominal hysterectomy; Cesarean section; Hernia repair; Appendectomy; Tonsillectomy and adenoidectomy; Spine surgery; Laparoscopic lysis intestinal adhesions; Maximum access (mas)posterior lumbar interbody fusion (plif) 1 level (N/A, 04/16/2015); and Anterior lat lumbar fusion (N/A, 04/07/2017). FAMILY HISTORY Her family history includes Asthma in her sister; Cancer in her maternal grandmother; Heart disease in her mother; Hyperlipidemia in her mother; Thyroid disease in her mother. SOCIAL HISTORY She  reports that she has never smoked. She has never used smokeless tobacco. She reports that she does not drink alcohol or use drugs.  Review of Systems  Constitutional: Negative for chills, fever and malaise/fatigue.  HENT: Negative for congestion, ear pain and sore throat.  Eyes: Negative.   Respiratory: Negative for cough, shortness of breath and wheezing.   Cardiovascular: Negative for chest pain, palpitations and leg swelling.  Gastrointestinal: Negative for abdominal pain, blood in stool, constipation, diarrhea, heartburn and melena.  Genitourinary: Negative.   Musculoskeletal: Positive for back pain and joint pain (left knee).  Skin: Negative.   Neurological: Negative for dizziness, sensory change, loss of consciousness and headaches.  Psychiatric/Behavioral: Negative for depression. The patient is not nervous/anxious and does not have insomnia.      Objective:     Today's Vitals   05/09/18 1007  BP: 120/86  Pulse: 67  Resp: 14  Temp: 97.7 F (36.5 C)  SpO2: 98%  Weight: 131 lb 9.6 oz (59.7 kg)  Height: 5\' 1"  (1.549 m)   Body  mass index is 24.87 kg/m.  General appearance: alert, no distress, WD/WN, female HEENT: normocephalic, sclerae anicteric, TMs pearly, nares patent, no discharge or erythema, pharynx normal Oral cavity: MMM, no lesions Neck: supple, no lymphadenopathy, no thyromegaly, no masses Heart: RRR, normal S1, S2, no murmurs Lungs: CTA bilaterally, no wheezes, rhonchi, or rales Abdomen: +bs, soft, non tender, non distended, no masses, no hepatomegaly, no splenomegaly Musculoskeletal: nontender, no swelling, no obvious deformity Extremities: no edema, no cyanosis, no clubbing Pulses: 2+ symmetric, upper and lower extremities, normal cap refill Neurological: alert, oriented x 3, CN2-12 intact, strength normal upper extremities and lower extremities, sensation normal throughout, DTRs 2+ throughout, no cerebellar signs, gait normal Psychiatric: normal affect, behavior normal, pleasant   EKG  Vicie Mutters, PA-C   05/09/2018

## 2018-05-09 ENCOUNTER — Encounter: Payer: Self-pay | Admitting: Physician Assistant

## 2018-05-09 ENCOUNTER — Ambulatory Visit (INDEPENDENT_AMBULATORY_CARE_PROVIDER_SITE_OTHER): Payer: Medicare Other | Admitting: Physician Assistant

## 2018-05-09 VITALS — BP 120/86 | HR 67 | Temp 97.7°F | Resp 14 | Ht 61.0 in | Wt 131.6 lb

## 2018-05-09 DIAGNOSIS — Z79899 Other long term (current) drug therapy: Secondary | ICD-10-CM

## 2018-05-09 DIAGNOSIS — E782 Mixed hyperlipidemia: Secondary | ICD-10-CM

## 2018-05-09 DIAGNOSIS — G894 Chronic pain syndrome: Secondary | ICD-10-CM

## 2018-05-09 DIAGNOSIS — R7309 Other abnormal glucose: Secondary | ICD-10-CM

## 2018-05-09 DIAGNOSIS — G8929 Other chronic pain: Secondary | ICD-10-CM

## 2018-05-09 DIAGNOSIS — I10 Essential (primary) hypertension: Secondary | ICD-10-CM | POA: Diagnosis not present

## 2018-05-09 DIAGNOSIS — Z1159 Encounter for screening for other viral diseases: Secondary | ICD-10-CM

## 2018-05-09 DIAGNOSIS — D649 Anemia, unspecified: Secondary | ICD-10-CM

## 2018-05-09 DIAGNOSIS — Z0000 Encounter for general adult medical examination without abnormal findings: Secondary | ICD-10-CM

## 2018-05-09 DIAGNOSIS — G2581 Restless legs syndrome: Secondary | ICD-10-CM

## 2018-05-09 DIAGNOSIS — N951 Menopausal and female climacteric states: Secondary | ICD-10-CM

## 2018-05-09 DIAGNOSIS — F419 Anxiety disorder, unspecified: Secondary | ICD-10-CM

## 2018-05-09 DIAGNOSIS — Z136 Encounter for screening for cardiovascular disorders: Secondary | ICD-10-CM | POA: Diagnosis not present

## 2018-05-09 DIAGNOSIS — K2100 Gastro-esophageal reflux disease with esophagitis, without bleeding: Secondary | ICD-10-CM

## 2018-05-09 DIAGNOSIS — M549 Dorsalgia, unspecified: Secondary | ICD-10-CM

## 2018-05-09 DIAGNOSIS — E039 Hypothyroidism, unspecified: Secondary | ICD-10-CM

## 2018-05-09 DIAGNOSIS — Z0001 Encounter for general adult medical examination with abnormal findings: Secondary | ICD-10-CM

## 2018-05-09 DIAGNOSIS — Z1389 Encounter for screening for other disorder: Secondary | ICD-10-CM

## 2018-05-09 DIAGNOSIS — E559 Vitamin D deficiency, unspecified: Secondary | ICD-10-CM

## 2018-05-09 NOTE — Patient Instructions (Addendum)
New guidelines suggest the benzodiazepines are best short term, with prolonged use they lead to physical and psychological dependence. In addition, evidence suggest that for insomnia the effectiveness wanes in 4 weeks and the risks out weight their benefits. Use of these agents have been associated with dementia, falls, motor vehicle accidents and physical addiction. Decreasing these medication have been proven to show improvements in cognition, alertness, decrease of falls and daytime sedation.   We will start a slow taper, symptoms of withdrawal include, insomnia, anxiety, irritability, sweating and stomach or intestinal symptoms like diarrhea or nausea.   Try the melatonin 5mg -20mg  dissolvable or gummy 30 mins before bed  11 Tips to Follow:  1. No caffeine after 3pm: Avoid beverages with caffeine (soda, tea, energy drinks, etc.) especially after 3pm. 2. Don't go to bed hungry: Have your evening meal at least 3 hrs. before going to sleep. It's fine to have a small bedtime snack such as a glass of milk and a few crackers but don't have a big meal. 3. Have a nightly routine before bed: Plan on "winding down" before you go to sleep. Begin relaxing about 1 hour before you go to bed. Try doing a quiet activity such as listening to calming music, reading a book or meditating. 4. Turn off the TV and ALL electronics including video games, tablets, laptops, etc. 1 hour before sleep, and keep them out of the bedroom. 5. Turn off your cell phone and all notifications (new email and text alerts) or even better, leave your phone outside your room while you sleep. Studies have shown that a part of your brain continues to respond to certain lights and sounds even while you're still asleep. 6. Make your bedroom quiet, dark and cool. If you can't control the noise, try wearing earplugs or using a fan to block out other sounds. 7. Practice relaxation techniques. Try reading a book or meditating or drain your brain by  writing a list of what you need to do the next day. 8. Don't nap unless you feel sick: you'll have a better night's sleep. 9. Don't smoke, or quit if you do. Nicotine, alcohol, and marijuana can all keep you awake. Talk to your health care provider if you need help with substance use. 10. Most importantly, wake up at the same time every day (or within 1 hour of your usual wake up time) EVEN on the weekends. A regular wake up time promotes sleep hygiene and prevents sleep problems. 11. Reduce exposure to bright light in the last three hours of the day before going to sleep. Maintaining good sleep hygiene and having good sleep habits lower your risk of developing sleep problems. Getting better sleep can also improve your concentration and alertness. Try the simple steps in this guide. If you still have trouble getting enough rest, make an appointment with your health care provider.  Recombinant Zoster (Shingles) Vaccine, RZV: What You Need to Know 1. Why get vaccinated? Shingles (also called herpes zoster, or just zoster) is a painful skin rash, often with blisters. Shingles is caused by the varicella zoster virus, the same virus that causes chickenpox. After you have chickenpox, the virus stays in your body and can cause shingles later in life. You can't catch shingles from another person. However, a person who has never had chickenpox (or chickenpox vaccine) could get chickenpox from someone with shingles. A shingles rash usually appears on one side of the face or body and heals within 2 to 4 weeks. Its  main symptom is pain, which can be severe. Other symptoms can include fever, headache, chills and upset stomach. Very rarely, a shingles infection can lead to pneumonia, hearing problems, blindness, brain inflammation (encephalitis), or death. For about 1 person in 5, severe pain can continue even long after the rash has cleared up. This long-lasting pain is called post-herpetic neuralgia (PHN). Shingles  is far more common in people 46 years of age and older than in younger people, and the risk increases with age. It is also more common in people whose immune system is weakened because of a disease such as cancer, or by drugs such as steroids or chemotherapy. At least 1 million people a year in the [place] get shingles. 2. Shingles vaccine (recombinant) Recombinant shingles vaccine was approved by FDA in 2017 for the prevention of shingles. In clinical trials, it was more than 90% effective in preventing shingles. It can also reduce the likelihood of PHN. Two doses, 2 to 6 months apart, are recommended for adults 42 and older. This vaccine is also recommended for people who have already gotten the live shingles vaccine (Zostavax). There is no live virus in this vaccine. 3. Some people should not get this vaccine Tell your vaccine provider if you:  Have any severe, life-threatening allergies. A person who has ever had a life-threatening allergic reaction after a dose of recombinant shingles vaccine, or has a severe allergy to any component of this vaccine, may be advised not to be vaccinated. Ask your health care provider if you want information about vaccine components.  Are pregnant or breastfeeding. There is not much information about use of recombinant shingles vaccine in pregnant or nursing women. Your healthcare provider might recommend delaying vaccination.  Are not feeling well. If you have a mild illness, such as a cold, you can probably get the vaccine today. If you are moderately or severely ill, you should probably wait until you recover. Your doctor can advise you.  4. Risks of a vaccine reaction With any medicine, including vaccines, there is a chance of reactions. After recombinant shingles vaccination, a person might experience:  Pain, redness, soreness, or swelling at the site of the injection  Headache, muscle aches, fever, shivering, fatigue  In clinical trials, most  people got a sore arm with mild or moderate pain after vaccination, and some also had redness and swelling where they got the shot. Some people felt tired, had muscle pain, a headache, shivering, fever, stomach pain, or nausea. About 1 out of 6 people who got recombinant zoster vaccine experienced side effects that prevented them from doing regular activities. Symptoms went away on their own in about 2 to 3 days. Side effects were more common in younger people. You should still get the second dose of recombinant zoster vaccine even if you had one of these reactions after the first dose. Other things that could happen after this vaccine:  People sometimes faint after medical procedures, including vaccination. Sitting or lying down for about 15 minutes can help prevent fainting and injuries caused by a fall. Tell your provider if you feel dizzy or have vision changes or ringing in the ears.  Some people get shoulder pain that can be more severe and longer-lasting than routine soreness that can follow injections. This happens very rarely.  Any medication can cause a severe allergic reaction. Such reactions to a vaccine are estimated at about 1 in a million doses, and would happen within a few minutes to a few hours  after the vaccination. As with any medicine, there is a very remote chance of a vaccine causing a serious injury or death. The safety of vaccines is always being monitored. For more information, visit: http://www.aguilar.org/ 5. What if there is a serious problem? What should I look for?  Look for anything that concerns you, such as signs of a severe allergic reaction, very high fever, or unusual behavior. Signs of a severe allergic reaction can include hives, swelling of the face and throat, difficulty breathing, a fast heartbeat, dizziness, and weakness. These would usually start a few minutes to a few hours after the vaccination. What should I do?  If you think it is a severe allergic  reaction or other emergency that can't wait, call 9-1-1 and get to the nearest hospital. Otherwise, call your health care provider. Afterward, the reaction should be reported to the Vaccine Adverse Event Reporting System (VAERS). Your doctor should file this report, or you can do it yourself through the VAERS web site atwww.vaers.https://www.bray.com/ by calling 970-112-4954. VAERS does not give medical advice. 6. How can I learn more?  Ask your healthcare provider. He or she can give you the vaccine package insert or suggest other sources of information.  Call your local or state health department.  Contact the Centers for Disease Control and Prevention (CDC): ? Call 754 308 1669 (1-800-CDC-INFO) or ? Visit the CDC's website at http://hunter.com/ CDC Vaccine Information Statement (VIS) Recombinant Zoster Vaccine (10/31/2016) This information is not intended to replace advice given to you by your health care provider. Make sure you discuss any questions you have with your health care provider. Document Released: 11/15/2016 Document Revised: 11/15/2016 Document Reviewed: 11/15/2016 Elsevier Interactive Patient Education  [employer].

## 2018-05-10 LAB — CBC WITH DIFFERENTIAL/PLATELET
BASOS ABS: 40 {cells}/uL (ref 0–200)
BASOS PCT: 0.9 %
EOS ABS: 202 {cells}/uL (ref 15–500)
Eosinophils Relative: 4.6 %
HCT: 37.1 % (ref 35.0–45.0)
HEMOGLOBIN: 12.6 g/dL (ref 11.7–15.5)
Lymphs Abs: 1866 cells/uL (ref 850–3900)
MCH: 30 pg (ref 27.0–33.0)
MCHC: 34 g/dL (ref 32.0–36.0)
MCV: 88.3 fL (ref 80.0–100.0)
MPV: 8.5 fL (ref 7.5–12.5)
Monocytes Relative: 7.5 %
NEUTROS ABS: 1962 {cells}/uL (ref 1500–7800)
Neutrophils Relative %: 44.6 %
Platelets: 352 10*3/uL (ref 140–400)
RBC: 4.2 10*6/uL (ref 3.80–5.10)
RDW: 13 % (ref 11.0–15.0)
TOTAL LYMPHOCYTE: 42.4 %
WBC: 4.4 10*3/uL (ref 3.8–10.8)
WBCMIX: 330 {cells}/uL (ref 200–950)

## 2018-05-10 LAB — URINALYSIS, ROUTINE W REFLEX MICROSCOPIC
BACTERIA UA: NONE SEEN /HPF
Bilirubin Urine: NEGATIVE
Glucose, UA: NEGATIVE
Hgb urine dipstick: NEGATIVE
Hyaline Cast: NONE SEEN /LPF
Ketones, ur: NEGATIVE
NITRITE: NEGATIVE
Protein, ur: NEGATIVE
RBC / HPF: NONE SEEN /HPF (ref 0–2)
SPECIFIC GRAVITY, URINE: 1.013 (ref 1.001–1.03)
SQUAMOUS EPITHELIAL / LPF: NONE SEEN /HPF (ref <=5)

## 2018-05-10 LAB — HEPATITIS C ANTIBODY
Hepatitis C Ab: NONREACTIVE
SIGNAL TO CUT-OFF: 0.01 (ref <1.00)

## 2018-05-10 LAB — COMPLETE METABOLIC PANEL WITH GFR
AG RATIO: 1.9 (calc) (ref 1.0–2.5)
ALBUMIN MSPROF: 4.6 g/dL (ref 3.6–5.1)
ALT: 9 U/L (ref 6–29)
AST: 17 U/L (ref 10–35)
Alkaline phosphatase (APISO): 60 U/L (ref 33–130)
BILIRUBIN TOTAL: 0.4 mg/dL (ref 0.2–1.2)
BUN: 14 mg/dL (ref 7–25)
CALCIUM: 9.2 mg/dL (ref 8.6–10.4)
CO2: 26 mmol/L (ref 20–32)
Chloride: 106 mmol/L (ref 98–110)
Creat: 0.63 mg/dL (ref 0.50–0.99)
GFR, EST AFRICAN AMERICAN: 108 mL/min/{1.73_m2} (ref >=60)
GFR, EST NON AFRICAN AMERICAN: 93 mL/min/{1.73_m2} (ref >=60)
Globulin: 2.4 g/dL (calc) (ref 1.9–3.7)
Glucose, Bld: 108 mg/dL — ABNORMAL HIGH (ref 65–99)
POTASSIUM: 4.1 mmol/L (ref 3.5–5.3)
Sodium: 140 mmol/L (ref 135–146)
TOTAL PROTEIN: 7 g/dL (ref 6.1–8.1)

## 2018-05-10 LAB — VITAMIN D 25 HYDROXY (VIT D DEFICIENCY, FRACTURES): VIT D 25 HYDROXY: 37 ng/mL (ref 30–100)

## 2018-05-10 LAB — LIPID PANEL
Cholesterol: 220 mg/dL — ABNORMAL HIGH (ref <200)
HDL: 89 mg/dL (ref >50)
LDL CHOLESTEROL (CALC): 113 mg/dL — AB
NON-HDL CHOLESTEROL (CALC): 131 mg/dL — AB (ref <130)
TRIGLYCERIDES: 84 mg/dL (ref <150)
Total CHOL/HDL Ratio: 2.5 (calc) (ref <5.0)

## 2018-05-10 LAB — IRON, TOTAL/TOTAL IRON BINDING CAP
%SAT: 17 % (calc) (ref 16–45)
IRON: 54 ug/dL (ref 45–160)
TIBC: 322 mcg/dL (calc) (ref 250–450)

## 2018-05-10 LAB — TSH: TSH: 5.81 mIU/L — ABNORMAL HIGH (ref 0.40–4.50)

## 2018-05-10 LAB — MAGNESIUM: MAGNESIUM: 1.8 mg/dL (ref 1.5–2.5)

## 2018-05-10 LAB — MICROALBUMIN / CREATININE URINE RATIO: CREATININE, URINE: 44 mg/dL (ref 20–275)

## 2018-05-10 NOTE — Addendum Note (Signed)
Addended by: Vicie Mutters R on: 05/10/2018 01:46 PM   Modules accepted: Orders

## 2018-06-18 ENCOUNTER — Ambulatory Visit (INDEPENDENT_AMBULATORY_CARE_PROVIDER_SITE_OTHER): Payer: Medicare Other | Admitting: *Deleted

## 2018-06-18 DIAGNOSIS — Z23 Encounter for immunization: Secondary | ICD-10-CM

## 2018-06-18 DIAGNOSIS — E039 Hypothyroidism, unspecified: Secondary | ICD-10-CM

## 2018-06-18 NOTE — Progress Notes (Signed)
Patient is here for a NV to recheck her TSH.  The patient is taking Synthroid 75 mcg daily.  She states she is taking the tablet 1 hour prior to eating, and is not taking  antacids until 4 hours after.

## 2018-06-19 LAB — TSH: TSH: 3.26 m[IU]/L (ref 0.40–4.50)

## 2018-06-21 ENCOUNTER — Encounter: Payer: Self-pay | Admitting: Physician Assistant

## 2018-06-21 ENCOUNTER — Ambulatory Visit: Payer: Medicare Other | Admitting: Physician Assistant

## 2018-06-21 VITALS — BP 132/72 | HR 87 | Temp 97.2°F | Ht 61.0 in | Wt 129.6 lb

## 2018-06-21 DIAGNOSIS — N3000 Acute cystitis without hematuria: Secondary | ICD-10-CM

## 2018-06-21 MED ORDER — SULFAMETHOXAZOLE-TRIMETHOPRIM 800-160 MG PO TABS: 1.0000 | ORAL_TABLET | Freq: Two times a day (BID) | ORAL | 0 refills | Status: DC

## 2018-06-21 MED ORDER — FLUCONAZOLE 150 MG PO TABS: 150.0000 mg | ORAL_TABLET | Freq: Every day | ORAL | 3 refills | Status: DC

## 2018-06-21 NOTE — Progress Notes (Signed)
Subjective:    Patient ID: Monique Thomas, female    DOB: November 17, 1950, 67 y.o.   MRN: [id number]  HPI 67 y.o. WF presents for possible UTI and needs clearance for total left knee on 07/17/18.  She has had UTI symptoms x 2 days. She is having right AB pain, pain with urination, hesitancy, dizzy, fatigue. No fever,chills, blood in urine, back/flank pain.    She has total left knee scheduled with Dr. Alvan Dame on 10/29 with spinal anesthesia. She has had surgery before and did well with it. She is not diabetic, BMI is normal. She is never a smoker.   Blood pressure 132/72, pulse 87, temperature (!) 97.2 F (36.2 C), height 5\' 1"  (1.549 m), weight 129 lb 9.6 oz (58.8 kg), SpO2 98 %.  Medications Current Outpatient Medications on File Prior to Visit  Medication Sig  . ALPRAZolam (XANAX) 1 MG tablet Take 1/2 to 1 tablet 2 to 3 x / day ONLY if needed for Anxiety Attack & please try to limit to 5 days /week to avoid addiction  . Cholecalciferol 5000 units TABS Take 1 tablet by mouth daily.  . Cyanocobalamin (B-12 PO) Take 1 tablet by mouth daily.  . cyclobenzaprine (FLEXERIL) 5 MG tablet Take 1 tablet (5 mg total) by mouth 3 (three) times daily as needed for muscle spasms.  Marland Kitchen estradiol (ESTRACE) 1 MG tablet Take 1 mg by mouth daily.  . Magnesium 500 MG CAPS Take 1,000 mg by mouth daily with lunch.   . SYNTHROID 75 MCG tablet Take 38mcg by mouth daily   No current facility-administered medications on file prior to visit.     Problem list She has Back pain, chronic; Hyperlipidemia; Hypothyroidism; GERD (gastroesophageal reflux disease); Anemia; Anxiety; Vitamin D deficiency; Medication management; Other abnormal glucose; Chronic pain syndrome; S/P lumbar spinal fusion; Restless legs; and Menopausal symptoms on their problem list.   Review of Systems  Constitutional: Negative for chills.  HENT: Negative.   Respiratory: Negative.   Cardiovascular: Negative.   Gastrointestinal: Negative.   Negative for nausea and vomiting.  Genitourinary: Positive for dysuria, frequency and urgency. Negative for decreased urine volume, difficulty urinating, dyspareunia, enuresis, flank pain, genital sores, hematuria, menstrual problem, pelvic pain, vaginal bleeding and vaginal discharge.       Objective:   Physical Exam  Constitutional: She is oriented to person, place, and time. She appears well-developed and well-nourished.  Neck: Normal range of motion. Neck supple.  Cardiovascular: Normal rate and regular rhythm.  Pulmonary/Chest: Effort normal and breath sounds normal.  Abdominal: Soft. Bowel sounds are normal. She exhibits no distension and no mass. There is tenderness. There is no rebound and no guarding.  Musculoskeletal: Normal range of motion. She exhibits no tenderness.  Neurological: She is alert and oriented to person, place, and time.  Skin: Skin is warm and dry.       Assessment & Plan:  Monique Thomas was seen today for dysuria, urinary urgency and abdominal pain.  Diagnoses and all orders for this visit:  Acute cystitis without hematuria -     sulfamethoxazole-trimethoprim (BACTRIM DS,SEPTRA DS) 800-160 MG tablet; Take 1 tablet by mouth 2 (two) times daily. -     fluconazole (DIFLUCAN) 150 MG tablet; Take 1 tablet (150 mg total) by mouth daily.   Cleared for surgery for her knee  The patient was advised to call immediately if she has any concerning symptoms in the interval. The patient voices understanding of current treatment options and is in agreement  with the current care plan.The patient knows to call the clinic with any problems, questions or concerns or go to the ER if any further progression of symptoms.

## 2018-06-21 NOTE — Addendum Note (Signed)
Addended by: Vicie Mutters R on: 06/21/2018 01:15 PM   Modules accepted: Orders

## 2018-06-21 NOTE — Patient Instructions (Signed)

## 2018-06-22 LAB — URINALYSIS, ROUTINE W REFLEX MICROSCOPIC
BACTERIA UA: NONE SEEN /HPF
Bilirubin Urine: NEGATIVE
Glucose, UA: NEGATIVE
HGB URINE DIPSTICK: NEGATIVE
Hyaline Cast: NONE SEEN /LPF
Ketones, ur: NEGATIVE
Nitrite: NEGATIVE
Protein, ur: NEGATIVE
SPECIFIC GRAVITY, URINE: 1.019 (ref 1.001–1.03)

## 2018-06-22 LAB — URINE CULTURE
MICRO NUMBER:: 91190993
SPECIMEN QUALITY: ADEQUATE

## 2018-07-08 NOTE — H&P (Signed)
TOTAL KNEE ADMISSION H&P  Patient is being admitted for left total knee arthroplasty.  Subjective:  Chief Complaint:  Left knee primary OA / pain  HPI: Monique Thomas, 67 y.o. female, has a history of pain and functional disability in the left knee due to arthritis and has failed non-surgical conservative treatments for greater than 12 weeks to include NSAID's and/or analgesics, corticosteriod injections, viscosupplementation injections and activity modification.  Onset of symptoms was gradual, starting ~1 years ago with gradually worsening course since that time. The patient noted prior procedures on the knee to include  arthroscopy and menisectomy on the left knee(s).  Patient currently rates pain in the left knee(s) at 8 out of 10 with activity. Patient has night pain, worsening of pain with activity and weight bearing, pain that interferes with activities of daily living, pain with passive range of motion, crepitus and joint swelling.  Patient has evidence of periarticular osteophytes and joint space narrowing by imaging studies.  There is no active infection.  Risks, benefits and expectations were discussed with the patient.  Risks including but not limited to the risk of anesthesia, blood clots, nerve damage, blood vessel damage, failure of the prosthesis, infection and up to and including death.  Patient understand the risks, benefits and expectations and wishes to proceed with surgery.   PCP: [name], MD  D/C Plans:       Home   Post-op Meds:       No Rx given  Tranexamic Acid:      To be given - IV   Decadron:      Is to be given  FYI:      ASA  Norco  DME:    Pt already has equipment  PT:   OPPT Rx given   Patient Active Problem List   Diagnosis Date Noted  . Menopausal symptoms 05/07/2018  . Restless legs 02/06/2018  . S/P lumbar spinal fusion 04/16/2015  . Chronic pain syndrome 03/27/2015  . Medication management 01/15/2014  . Other abnormal glucose 01/15/2014   . Back pain, chronic   . Hyperlipidemia   . Hypothyroidism   . GERD (gastroesophageal reflux disease)   . Anemia   . Anxiety   . Vitamin D deficiency    Past Medical History:  Diagnosis Date  . Anemia   . Anxiety   . Arthritis   . Back pain, chronic   . Depression   . GERD (gastroesophageal reflux disease)   . Hyperlipidemia   . Hypothyroidism   . Kidney stone    2015  . Thyroid disease   . Umbilical hernia   . Unspecified vitamin D deficiency     Past Surgical History:  Procedure Laterality Date  . ABDOMINAL HYSTERECTOMY    . ANTERIOR LAT LUMBAR FUSION N/A 04/07/2017   Procedure: LUMBAR ONE-TWO ANTERIOR LATERAL LUMBAR FUSION WITH LATERAL PLATE;  Surgeon: Eustace Moore, MD;  Location: [facility];  Service: Neurosurgery;  Laterality: N/A;  Right side approach  . APPENDECTOMY    . CESAREAN SECTION    . HERNIA REPAIR    . LAPAROSCOPIC LYSIS INTESTINAL ADHESIONS    . MAXIMUM ACCESS (MAS)POSTERIOR LUMBAR INTERBODY FUSION (PLIF) 1 LEVEL N/A 04/16/2015   Procedure: masPLIF  - L2-L3 ;  Surgeon: Eustace Moore, MD;  Location: [facility];  Service: Neurosurgery;  Laterality: N/A;  masPLIF  - L2-L3   . SPINE SURGERY     lumbar  . TONSILLECTOMY AND ADENOIDECTOMY  No current facility-administered medications for this encounter.    Current Outpatient Medications  Medication Sig Dispense Refill Last Dose  . ALPRAZolam (XANAX) 1 MG tablet Take 1/2 to 1 tablet 2 to 3 x / day ONLY if needed for Anxiety Attack & please try to limit to 5 days /week to avoid addiction (Patient taking differently: Take 1 mg by mouth 3 (three) times daily as needed for anxiety. Anxiety Attack & please try to limit to 5 days /week to avoid addiction) 90 tablet 0 Taking  . cyclobenzaprine (FLEXERIL) 5 MG tablet Take 1 tablet (5 mg total) by mouth 3 (three) times daily as needed for muscle spasms. (Patient taking differently: Take 5 mg by mouth at bedtime. ) 90 tablet 0 Taking  . estradiol (ESTRACE) 1 MG tablet  Take 1 mg by mouth every evening.    Taking  . naproxen sodium (ALEVE) 220 MG tablet Take 440 mg by mouth 2 (two) times daily as needed (for pain.).     Marland Kitchen SYNTHROID 75 MCG tablet Take 75 mcg by mouth daily before breakfast.   2 Taking  . Cholecalciferol (VITAMIN D3 PO) Take 1 tablet by mouth daily.     . Cyanocobalamin (B-12 PO) Take 1 tablet by mouth daily.   Taking  . fluconazole (DIFLUCAN) 150 MG tablet Take 1 tablet (150 mg total) by mouth daily. (Patient not taking: Reported on 07/04/2018) 1 tablet 3 Not Taking at Unknown time  . MAGNESIUM PO Take 3,000 mg by mouth daily at 2 PM.     . sulfamethoxazole-trimethoprim (BACTRIM DS,SEPTRA DS) 800-160 MG tablet Take 1 tablet by mouth 2 (two) times daily. (Patient not taking: Reported on 07/04/2018) 10 tablet 0 Not Taking at Unknown time   Allergies  Allergen Reactions  . Celebrex [Celecoxib]     "panic attack"  . Other     Skin bruises very easily and peels back  . Topamax [Topiramate]     MEMORY  . Entex Lq [Phenylephrine-Guaifenesin] Rash  . Fiorinal [Butalbital-Aspirin-Caffeine] Rash    Social History   Tobacco Use  . Smoking status: Never Smoker  . Smokeless tobacco: Never Used  Substance Use Topics  . Alcohol use: No    Family History  Problem Relation Age of Onset  . Heart disease Mother   . Hyperlipidemia Mother   . Thyroid disease Mother   . Asthma Sister   . Cancer Maternal Grandmother        ovarian     Review of Systems  Constitutional: Negative.   HENT: Negative.   Eyes: Negative.   Respiratory: Negative.   Cardiovascular: Negative.   Gastrointestinal: Positive for heartburn.  Genitourinary: Negative.   Musculoskeletal: Positive for back pain and joint pain.  Skin: Negative.   Neurological: Negative.   Endo/Heme/Allergies: Negative.   Psychiatric/Behavioral: The patient is nervous/anxious.     Objective:  Physical Exam  Constitutional: She is oriented to person, place, and time. She appears  well-developed.  HENT:  Head: Normocephalic.  Eyes: Pupils are equal, round, and reactive to light.  Neck: Neck supple. No JVD present. No tracheal deviation present. No thyromegaly present.  Cardiovascular: Normal rate, regular rhythm and intact distal pulses.  Respiratory: Effort normal and breath sounds normal. No respiratory distress. She has no wheezes.  GI: Soft. There is no tenderness. There is no guarding.  Musculoskeletal:       Left knee: She exhibits decreased range of motion, swelling and bony tenderness. She exhibits no ecchymosis, no deformity, no  laceration and no erythema. Tenderness found.  Lymphadenopathy:    She has no cervical adenopathy.  Neurological: She is alert and oriented to person, place, and time. A sensory deficit (occassional left LE numbness, from her back) is present.  Skin: Skin is warm and dry.  Psychiatric: She has a normal mood and affect.      Labs:  Estimated body mass index is 24.49 kg/m as calculated from the following:   Height as of 06/21/18: 5\' 1"  (1.549 m).   Weight as of 06/21/18: 58.8 kg.   Imaging Review Plain radiographs demonstrate severe degenerative joint disease of the left knee(s). The overall alignment is valgus. The bone quality appears to be good for age and reported activity level.   Preoperative templating of the joint replacement has been completed, documented, and submitted to the Operating Room personnel in order to optimize intra-operative equipment management.    Patient's anticipated LOS is less than 2 midnights, meeting these requirements: - Lives within 1 hour of care - Has a competent adult at home to recover with post-op recover - NO history of  - Chronic pain requiring opiods  - Diabetes  - Coronary Artery Disease  - Heart failure  - Heart attack  - Stroke  - DVT/VTE  - Cardiac arrhythmia  - Respiratory Failure/COPD  - Renal failure  - Anemia  - Advanced Liver disease     Assessment/Plan:  End  stage arthritis, left knee   The patient history, physical examination, clinical judgment of the provider and imaging studies are consistent with end stage degenerative joint disease of the left knee(s) and total knee arthroplasty is deemed medically necessary. The treatment options including medical management, injection therapy arthroscopy and arthroplasty were discussed at length. The risks and benefits of total knee arthroplasty were presented and reviewed. The risks due to aseptic loosening, infection, stiffness, patella tracking problems, thromboembolic complications and other imponderables were discussed. The patient acknowledged the explanation, agreed to proceed with the plan and consent was signed. Patient is being admitted for inpatient treatment for surgery, pain control, PT, OT, prophylactic antibiotics, VTE prophylaxis, progressive ambulation and ADL's and discharge planning. The patient is planning to be discharged home.      [place] P[place] Nykeem Citro   PA-C  07/08/2018, 2:18 PM

## 2018-07-10 NOTE — Patient Instructions (Signed)
Monique Thomas  07/10/2018   Your procedure is scheduled on: 07-17-18   Report to [facility] Main  Entrance    Report to admitting at 1:15PM    Call this number if you have problems the morning of surgery [phone]     Remember: Do not eat food After Midnight. YOU MAY HAVE CLEAR LIQUIDS FROM MIDNIGHT UNTIL 9:45AM. NOTHING BY MOUTH AFTER 9:45AM!BRUSH YOUR TEETH MORNING OF SURGERY AND RINSE YOUR MOUTH OUT, NO CHEWING GUM CANDY OR MINTS.      CLEAR LIQUID DIET   Foods Allowed                                                                     Foods Excluded  Coffee and tea, regular and decaf                             liquids that you cannot  Plain Jell-O in any flavor                                             see through such as: Fruit ices (not with fruit pulp)                                     milk, soups, orange juice  Iced Popsicles                                    All solid food Carbonated beverages, regular and diet                                    Cranberry, grape and apple juices Sports drinks like Gatorade Lightly seasoned clear broth or consume(fat free) Sugar, honey syrup  Sample Menu Breakfast                                Lunch                                     Supper Cranberry juice                    Beef broth                            Chicken broth Jell-O                                     Grape juice  Apple juice Coffee or tea                        Jell-O                                      Popsicle                                                Coffee or tea                        Coffee or tea  _____________________________________________________________________       Take these medicines the morning of surgery with A SIP OF WATER: synthroid, xanax if needed                                You may not have any metal on your body including hair pins and              piercings  Do  not wear jewelry, make-up, lotions, powders or perfumes, deodorant             Do not wear nail polish.  Do not shave  48 hours prior to surgery.          Do not bring valuables to the hospital. Monique Thomas.  Contacts, dentures or bridgework may not be worn into surgery.  Leave suitcase in the car. After surgery it may be brought to your room.                   Please read over the following fact sheets you were given: _____________________________________________________________________             [facility] - Preparing for Surgery Before surgery, you can play an important role.  Because skin is not sterile, your skin needs to be as free of germs as possible.  You can reduce the number of germs on your skin by washing with CHG (chlorahexidine gluconate) soap before surgery.  CHG is an antiseptic cleaner which kills germs and bonds with the skin to continue killing germs even after washing. Please DO NOT use if you have an allergy to CHG or antibacterial soaps.  If your skin becomes reddened/irritated stop using the CHG and inform your nurse when you arrive at Short Stay. Do not shave (including legs and underarms) for at least 48 hours prior to the first CHG shower.  You may shave your face/neck. Please follow these instructions carefully:  1.  Shower with CHG Soap the night before surgery and the  morning of Surgery.  2.  If you choose to wash your hair, wash your hair first as usual with your  normal  shampoo.  3.  After you shampoo, rinse your hair and body thoroughly to remove the  shampoo.                           4.  Use CHG as you would any other liquid soap.  You can apply  chg directly  to the skin and wash                       Gently with a scrungie or clean washcloth.  5.  Apply the CHG Soap to your body ONLY FROM THE NECK DOWN.   Do not use on face/ open                           Wound or open sores. Avoid contact with eyes,  ears mouth and genitals (private parts).                       Wash face,  Genitals (private parts) with your normal soap.             6.  Wash thoroughly, paying special attention to the area where your surgery  will be performed.  7.  Thoroughly rinse your body with warm water from the neck down.  8.  DO NOT shower/wash with your normal soap after using and rinsing off  the CHG Soap.                9.  Pat yourself dry with a clean towel.            10.  Wear clean pajamas.            11.  Place clean sheets on your bed the night of your first shower and do not  sleep with pets. Day of Surgery : Do not apply any lotions/deodorants the morning of surgery.  Please wear clean clothes to the hospital/surgery center.  FAILURE TO FOLLOW THESE INSTRUCTIONS MAY RESULT IN THE CANCELLATION OF YOUR SURGERY PATIENT SIGNATURE_________________________________  NURSE SIGNATURE__________________________________  ________________________________________________________________________   Monique Thomas  An incentive spirometer is a tool that can help keep your lungs clear and active. This tool measures how well you are filling your lungs with each breath. Taking long deep breaths may help reverse or decrease the chance of developing breathing (pulmonary) problems (especially infection) following:  A long period of time when you are unable to move or be active. BEFORE THE PROCEDURE   If the spirometer includes an indicator to show your best effort, your nurse or respiratory therapist will set it to a desired goal.  If possible, sit up straight or lean slightly forward. Try not to slouch.  Hold the incentive spirometer in an upright position. INSTRUCTIONS FOR USE  1. Sit on the edge of your bed if possible, or sit up as far as you can in bed or on a chair. 2. Hold the incentive spirometer in an upright position. 3. Breathe out normally. 4. Place the mouthpiece in your mouth and seal your lips  tightly around it. 5. Breathe in slowly and as deeply as possible, raising the piston or the ball toward the top of the column. 6. Hold your breath for 3-5 seconds or for as long as possible. Allow the piston or ball to fall to the bottom of the column. 7. Remove the mouthpiece from your mouth and breathe out normally. 8. Rest for a few seconds and repeat Steps 1 through 7 at least 10 times every 1-2 hours when you are awake. Take your time and take a few normal breaths between deep breaths. 9. The spirometer may include an indicator to show your best effort. Use the indicator as a goal to work toward during each  repetition. 10. After each set of 10 deep breaths, practice coughing to be sure your lungs are clear. If you have an incision (the cut made at the time of surgery), support your incision when coughing by placing a pillow or rolled up towels firmly against it. Once you are able to get out of bed, walk around indoors and cough well. You may stop using the incentive spirometer when instructed by your caregiver.  RISKS AND COMPLICATIONS  Take your time so you do not get dizzy or light-headed.  If you are in pain, you may need to take or ask for pain medication before doing incentive spirometry. It is harder to take a deep breath if you are having pain. AFTER USE  Rest and breathe slowly and easily.  It can be helpful to keep track of a log of your progress. Your caregiver can provide you with a simple table to help with this. If you are using the spirometer at home, follow these instructions: [place] IF:   You are having difficultly using the spirometer.  You have trouble using the spirometer as often as instructed.  Your pain medication is not giving enough relief while using the spirometer.  You develop fever of 100.5 F (38.1 C) or higher. SEEK IMMEDIATE MEDICAL CARE IF:   You cough up bloody sputum that had not been present before.  You develop fever of 102 F  (38.9 C) or greater.  You develop worsening pain at or near the incision site. MAKE SURE YOU:   Understand these instructions.  Will watch your condition.  Will get help right away if you are not doing well or get worse. Document Released: 01/16/2007 Document Revised: 11/28/2011 Document Reviewed: 03/19/2007 Exit[employer] Patient Information 2014 Exit[employer], [place].   ________________________________________________________________________  WHAT IS A BLOOD TRANSFUSION? Blood Transfusion Information  A transfusion is the replacement of blood or some of its parts. Blood is made up of multiple cells which provide different functions.  Red blood cells carry oxygen and are used for blood loss replacement.  White blood cells fight against infection.  Platelets control bleeding.  Plasma helps clot blood.  Other blood products are available for specialized needs, such as hemophilia or other clotting disorders. BEFORE THE TRANSFUSION  Who gives blood for transfusions?   Healthy volunteers who are fully evaluated to make sure their blood is safe. This is blood bank blood. Transfusion therapy is the safest it has ever been in the practice of medicine. Before blood is taken from a donor, a complete history is taken to make sure that person has no history of diseases nor engages in risky social behavior (examples are intravenous drug use or sexual activity with multiple partners). The donor's travel history is screened to minimize risk of transmitting infections, such as malaria. The donated blood is tested for signs of infectious diseases, such as HIV and hepatitis. The blood is then tested to be sure it is compatible with you in order to minimize the chance of a transfusion reaction. If you or a relative donates blood, this is often done in anticipation of surgery and is not appropriate for emergency situations. It takes many days to process the donated blood. RISKS AND COMPLICATIONS Although  transfusion therapy is very safe and saves many lives, the main dangers of transfusion include:   Getting an infectious disease.  Developing a transfusion reaction. This is an allergic reaction to something in the blood you were given. Every precaution is taken to prevent this.  The decision to have a blood transfusion has been considered carefully by your caregiver before blood is given. Blood is not given unless the benefits outweigh the risks. AFTER THE TRANSFUSION  Right after receiving a blood transfusion, you will usually feel much better and more energetic. This is especially true if your red blood cells have gotten low (anemic). The transfusion raises the level of the red blood cells which carry oxygen, and this usually causes an energy increase.  The nurse administering the transfusion will monitor you carefully for complications. HOME CARE INSTRUCTIONS  No special instructions are needed after a transfusion. You may find your energy is better. Speak with your caregiver about any limitations on activity for underlying diseases you may have. SEEK MEDICAL CARE IF:   Your condition is not improving after your transfusion.  You develop redness or irritation at the intravenous (IV) site. SEEK IMMEDIATE MEDICAL CARE IF:  Any of the following symptoms occur over the next 12 hours:  Shaking chills.  You have a temperature by mouth above 102 F (38.9 C), not controlled by medicine.  Chest, back, or muscle pain.  People around you feel you are not acting correctly or are confused.  Shortness of breath or difficulty breathing.  Dizziness and fainting.  You get a rash or develop hives.  You have a decrease in urine output.  Your urine turns a dark color or changes to pink, red, or brown. Any of the following symptoms occur over the next 10 days:  You have a temperature by mouth above 102 F (38.9 C), not controlled by medicine.  Shortness of breath.  Weakness after normal  activity.  The white part of the eye turns yellow (jaundice).  You have a decrease in the amount of urine or are urinating less often.  Your urine turns a dark color or changes to pink, red, or brown. Document Released: 09/02/2000 Document Revised: 11/28/2011 Document Reviewed: 04/21/2008 [facility] Patient Information 2014 [place], [place].  _______________________________________________________________________

## 2018-07-10 NOTE — Progress Notes (Signed)
LOV/medical clearance Vicie Mutters, PA-C 06-21-18 on chart   EKG 05-09-18 on cahrt /epic   Urine culture, UA 06-21-18 epic

## 2018-07-11 ENCOUNTER — Encounter (HOSPITAL_COMMUNITY): Payer: Self-pay

## 2018-07-11 ENCOUNTER — Other Ambulatory Visit: Payer: Self-pay

## 2018-07-11 ENCOUNTER — Encounter (HOSPITAL_COMMUNITY)
Admission: RE | Admit: 2018-07-11 | Discharge: 2018-07-11 | Disposition: A | Discharge status: Home / Self Care | Payer: Medicare Other | Source: Ambulatory Visit | Attending: Orthopedic Surgery | Admitting: Orthopedic Surgery

## 2018-07-11 DIAGNOSIS — M25562 Pain in left knee: Secondary | ICD-10-CM | POA: Insufficient documentation

## 2018-07-11 DIAGNOSIS — Z01812 Encounter for preprocedural laboratory examination: Secondary | ICD-10-CM | POA: Insufficient documentation

## 2018-07-11 DIAGNOSIS — M1712 Unilateral primary osteoarthritis, left knee: Secondary | ICD-10-CM | POA: Diagnosis not present

## 2018-07-11 HISTORY — DX: Malignant (primary) neoplasm, unspecified: C80.1

## 2018-07-11 LAB — CBC
HCT: 37 % (ref 36.0–46.0)
HEMOGLOBIN: 12.1 g/dL (ref 12.0–15.0)
MCH: 29.7 pg (ref 26.0–34.0)
MCHC: 32.7 g/dL (ref 30.0–36.0)
MCV: 90.7 fL (ref 80.0–100.0)
Platelets: 345 10*3/uL (ref 150–400)
RBC: 4.08 MIL/uL (ref 3.87–5.11)
RDW: 13.1 % (ref 11.5–15.5)
WBC: 4.8 10*3/uL (ref 4.0–10.5)
nRBC: 0 % (ref 0.0–0.2)

## 2018-07-11 LAB — SURGICAL PCR SCREEN
MRSA, PCR: NEGATIVE
STAPHYLOCOCCUS AUREUS: NEGATIVE

## 2018-07-11 LAB — ABO/RH: ABO/RH(D): B POS

## 2018-07-12 ENCOUNTER — Telehealth: Payer: Self-pay | Admitting: Physician Assistant

## 2018-07-12 DIAGNOSIS — F419 Anxiety disorder, unspecified: Secondary | ICD-10-CM

## 2018-07-12 MED ORDER — ALPRAZOLAM 1 MG PO TABS: ORAL_TABLET | ORAL | 0 refills | Status: DC

## 2018-07-12 NOTE — Telephone Encounter (Signed)
-----   Message from Elenor Quinones, [place] sent at 07/12/2018 12:24 PM EDT ----- Regarding: refill Per pt/[place] note:   Refill on XANAX Please & Thank You!   FYI: pharmacy: [facility]

## 2018-07-12 NOTE — Progress Notes (Signed)
Patient called and made aware of time change for surgery on 07/17/2018.   Patient made aware to arrive at 10230am on 10/29 in Admitting and may have clear liquids until 0630am then npo.

## 2018-07-17 ENCOUNTER — Other Ambulatory Visit: Payer: Self-pay

## 2018-07-17 ENCOUNTER — Observation Stay (HOSPITAL_COMMUNITY)
Admission: RE | Admit: 2018-07-17 | Discharge: 2018-07-18 | Disposition: A | Discharge status: Home Health | Payer: Medicare Other | Source: Other Acute Inpatient Hospital | Attending: Orthopedic Surgery | Admitting: Orthopedic Surgery

## 2018-07-17 ENCOUNTER — Ambulatory Visit (HOSPITAL_COMMUNITY): Payer: Medicare Other | Admitting: Anesthesiology

## 2018-07-17 ENCOUNTER — Encounter (HOSPITAL_COMMUNITY): Payer: Self-pay | Admitting: General Practice

## 2018-07-17 ENCOUNTER — Encounter (HOSPITAL_COMMUNITY)
Admission: RE | Disposition: A | Discharge status: Home Health | Payer: Self-pay | Source: Other Acute Inpatient Hospital | Attending: Orthopedic Surgery

## 2018-07-17 DIAGNOSIS — M25762 Osteophyte, left knee: Secondary | ICD-10-CM | POA: Insufficient documentation

## 2018-07-17 DIAGNOSIS — F419 Anxiety disorder, unspecified: Secondary | ICD-10-CM | POA: Diagnosis not present

## 2018-07-17 DIAGNOSIS — E039 Hypothyroidism, unspecified: Secondary | ICD-10-CM | POA: Diagnosis not present

## 2018-07-17 DIAGNOSIS — Z79899 Other long term (current) drug therapy: Secondary | ICD-10-CM | POA: Insufficient documentation

## 2018-07-17 DIAGNOSIS — Z79890 Hormone replacement therapy (postmenopausal): Secondary | ICD-10-CM | POA: Diagnosis not present

## 2018-07-17 DIAGNOSIS — Z85828 Personal history of other malignant neoplasm of skin: Secondary | ICD-10-CM | POA: Diagnosis not present

## 2018-07-17 DIAGNOSIS — Z96659 Presence of unspecified artificial knee joint: Secondary | ICD-10-CM

## 2018-07-17 DIAGNOSIS — E559 Vitamin D deficiency, unspecified: Secondary | ICD-10-CM | POA: Insufficient documentation

## 2018-07-17 DIAGNOSIS — M1712 Unilateral primary osteoarthritis, left knee: Principal | ICD-10-CM | POA: Insufficient documentation

## 2018-07-17 HISTORY — PX: TOTAL KNEE ARTHROPLASTY: SHX125

## 2018-07-17 LAB — TYPE AND SCREEN
ABO/RH(D): B POS
ANTIBODY SCREEN: NEGATIVE

## 2018-07-17 SURGERY — ARTHROPLASTY, KNEE, TOTAL
Anesthesia: Spinal | Site: Knee | Laterality: Left

## 2018-07-17 MED ORDER — MENTHOL 3 MG MT LOZG: 1.0000 | LOZENGE | OROMUCOSAL | Status: DC | PRN

## 2018-07-17 MED ORDER — ALPRAZOLAM 0.5 MG PO TABS
0.5000 mg | ORAL_TABLET | Freq: Three times a day (TID) | ORAL | Status: DC | PRN
  Administered 2018-07-17: 1 mg via ORAL
  Filled 2018-07-17: qty 2

## 2018-07-17 MED ORDER — PROPOFOL 10 MG/ML IV BOLUS
INTRAVENOUS | Status: AC
  Filled 2018-07-17: qty 40

## 2018-07-17 MED ORDER — STERILE WATER FOR IRRIGATION IR SOLN
Status: DC | PRN
  Administered 2018-07-17: 2000 mL

## 2018-07-17 MED ORDER — SODIUM CHLORIDE 0.9 % IV SOLN
INTRAVENOUS | Status: DC | PRN
  Administered 2018-07-17: 50 ug/min via INTRAVENOUS

## 2018-07-17 MED ORDER — DEXAMETHASONE SODIUM PHOSPHATE 10 MG/ML IJ SOLN
INTRAMUSCULAR | Status: DC | PRN
  Administered 2018-07-17: 10 mg via INTRAVENOUS

## 2018-07-17 MED ORDER — TRANEXAMIC ACID-NACL 1000-0.7 MG/100ML-% IV SOLN
1000.0000 mg | INTRAVENOUS | Status: AC
  Administered 2018-07-17: 1000 mg via INTRAVENOUS
  Filled 2018-07-17: qty 100

## 2018-07-17 MED ORDER — ONDANSETRON HCL 4 MG/2ML IJ SOLN
INTRAMUSCULAR | Status: AC
  Filled 2018-07-17: qty 2

## 2018-07-17 MED ORDER — KETOROLAC TROMETHAMINE 30 MG/ML IJ SOLN
INTRAMUSCULAR | Status: AC
  Filled 2018-07-17: qty 1

## 2018-07-17 MED ORDER — METHOCARBAMOL 500 MG PO TABS
500.0000 mg | ORAL_TABLET | Freq: Four times a day (QID) | ORAL | Status: DC | PRN
  Administered 2018-07-17 – 2018-07-18 (×2): 500 mg via ORAL
  Filled 2018-07-17 (×2): qty 1

## 2018-07-17 MED ORDER — PROMETHAZINE HCL 25 MG/ML IJ SOLN: 6.2500 mg | INTRAMUSCULAR | Status: DC | PRN

## 2018-07-17 MED ORDER — METHOCARBAMOL 500 MG IVPB - SIMPLE MED
500.0000 mg | Freq: Four times a day (QID) | INTRAVENOUS | Status: DC | PRN
  Filled 2018-07-17: qty 50

## 2018-07-17 MED ORDER — METOCLOPRAMIDE HCL 5 MG/ML IJ SOLN: 5.0000 mg | Freq: Three times a day (TID) | INTRAMUSCULAR | Status: DC | PRN

## 2018-07-17 MED ORDER — DIPHENHYDRAMINE HCL 12.5 MG/5ML PO ELIX: 12.5000 mg | ORAL_SOLUTION | ORAL | Status: DC | PRN

## 2018-07-17 MED ORDER — HYDROCODONE-ACETAMINOPHEN 7.5-325 MG PO TABS: 1.0000 | ORAL_TABLET | ORAL | Status: DC | PRN

## 2018-07-17 MED ORDER — LEVOTHYROXINE SODIUM 75 MCG PO TABS
75.0000 ug | ORAL_TABLET | Freq: Every day | ORAL | Status: DC
  Administered 2018-07-18: 75 ug via ORAL
  Filled 2018-07-17: qty 1

## 2018-07-17 MED ORDER — FERROUS SULFATE 325 (65 FE) MG PO TABS
325.0000 mg | ORAL_TABLET | Freq: Two times a day (BID) | ORAL | Status: DC
  Administered 2018-07-18: 325 mg via ORAL
  Filled 2018-07-17: qty 1

## 2018-07-17 MED ORDER — POLYETHYLENE GLYCOL 3350 17 G PO PACK: 17.0000 g | PACK | Freq: Two times a day (BID) | ORAL | 0 refills | Status: DC

## 2018-07-17 MED ORDER — HYDROMORPHONE HCL 1 MG/ML IJ SOLN
0.5000 mg | INTRAMUSCULAR | Status: DC | PRN
  Administered 2018-07-17: 1 mg via INTRAVENOUS
  Administered 2018-07-17: 0.5 mg via INTRAVENOUS
  Filled 2018-07-17 (×2): qty 1

## 2018-07-17 MED ORDER — BUPIVACAINE IN DEXTROSE 0.75-8.25 % IT SOLN
INTRATHECAL | Status: DC | PRN
  Administered 2018-07-17: 1.6 mL via INTRATHECAL

## 2018-07-17 MED ORDER — ONDANSETRON HCL 4 MG/2ML IJ SOLN: 4.0000 mg | Freq: Four times a day (QID) | INTRAMUSCULAR | Status: DC | PRN

## 2018-07-17 MED ORDER — PHENYLEPHRINE HCL 10 MG/ML IJ SOLN
INTRAMUSCULAR | Status: AC
  Filled 2018-07-17: qty 1

## 2018-07-17 MED ORDER — FERROUS SULFATE 325 (65 FE) MG PO TABS: 325.0000 mg | ORAL_TABLET | Freq: Three times a day (TID) | ORAL | 3 refills | Status: DC

## 2018-07-17 MED ORDER — ONDANSETRON HCL 4 MG/2ML IJ SOLN
INTRAMUSCULAR | Status: DC | PRN
  Administered 2018-07-17: 4 mg via INTRAVENOUS

## 2018-07-17 MED ORDER — EPHEDRINE 5 MG/ML INJ
INTRAVENOUS | Status: AC
  Filled 2018-07-17: qty 10

## 2018-07-17 MED ORDER — BUPIVACAINE-EPINEPHRINE (PF) 0.25% -1:200000 IJ SOLN
INTRAMUSCULAR | Status: DC | PRN
  Administered 2018-07-17: 30 mL via PERINEURAL

## 2018-07-17 MED ORDER — DOCUSATE SODIUM 100 MG PO CAPS: 100.0000 mg | ORAL_CAPSULE | Freq: Two times a day (BID) | ORAL | 0 refills | Status: DC

## 2018-07-17 MED ORDER — PNEUMOCOCCAL VAC POLYVALENT 25 MCG/0.5ML IJ INJ
0.5000 mL | INJECTION | INTRAMUSCULAR | Status: DC
  Filled 2018-07-17: qty 0.5

## 2018-07-17 MED ORDER — SODIUM CHLORIDE 0.9 % IV SOLN
INTRAVENOUS | Status: DC
  Administered 2018-07-17: 17:00:00 via INTRAVENOUS

## 2018-07-17 MED ORDER — SODIUM CHLORIDE 0.9 % IJ SOLN
INTRAMUSCULAR | Status: DC | PRN
  Administered 2018-07-17: 29 mL via INTRAVENOUS

## 2018-07-17 MED ORDER — POLYETHYLENE GLYCOL 3350 17 G PO PACK
17.0000 g | PACK | Freq: Two times a day (BID) | ORAL | Status: DC
  Administered 2018-07-17 – 2018-07-18 (×2): 17 g via ORAL
  Filled 2018-07-17 (×2): qty 1

## 2018-07-17 MED ORDER — CEFAZOLIN SODIUM-DEXTROSE 2-4 GM/100ML-% IV SOLN
2.0000 g | Freq: Four times a day (QID) | INTRAVENOUS | Status: AC
  Administered 2018-07-17 (×2): 2 g via INTRAVENOUS
  Filled 2018-07-17 (×2): qty 100

## 2018-07-17 MED ORDER — LACTATED RINGERS IV SOLN
Freq: Once | INTRAVENOUS | Status: AC
  Administered 2018-07-17: 11:00:00 via INTRAVENOUS

## 2018-07-17 MED ORDER — ASPIRIN 81 MG PO CHEW
81.0000 mg | CHEWABLE_TABLET | Freq: Two times a day (BID) | ORAL | Status: DC
  Administered 2018-07-17 – 2018-07-18 (×2): 81 mg via ORAL
  Filled 2018-07-17 (×2): qty 1

## 2018-07-17 MED ORDER — LIDOCAINE 2% (20 MG/ML) 5 ML SYRINGE
INTRAMUSCULAR | Status: DC | PRN
  Administered 2018-07-17: 100 mg via INTRAVENOUS

## 2018-07-17 MED ORDER — DOCUSATE SODIUM 100 MG PO CAPS
100.0000 mg | ORAL_CAPSULE | Freq: Two times a day (BID) | ORAL | Status: DC
  Administered 2018-07-17 – 2018-07-18 (×2): 100 mg via ORAL
  Filled 2018-07-17 (×2): qty 1

## 2018-07-17 MED ORDER — DEXAMETHASONE SODIUM PHOSPHATE 10 MG/ML IJ SOLN
INTRAMUSCULAR | Status: AC
  Filled 2018-07-17: qty 1

## 2018-07-17 MED ORDER — ALUM & MAG HYDROXIDE-SIMETH 200-200-20 MG/5ML PO SUSP: 15.0000 mL | ORAL | Status: DC | PRN

## 2018-07-17 MED ORDER — SODIUM CHLORIDE 0.9 % IJ SOLN
INTRAMUSCULAR | Status: AC
  Filled 2018-07-17: qty 50

## 2018-07-17 MED ORDER — LACTATED RINGERS IV SOLN
INTRAVENOUS | Status: DC | PRN
  Administered 2018-07-17 (×2): via INTRAVENOUS

## 2018-07-17 MED ORDER — CHLORHEXIDINE GLUCONATE 4 % EX LIQD: 60.0000 mL | Freq: Once | CUTANEOUS | Status: DC

## 2018-07-17 MED ORDER — EPHEDRINE SULFATE-NACL 50-0.9 MG/10ML-% IV SOSY
PREFILLED_SYRINGE | INTRAVENOUS | Status: DC | PRN
  Administered 2018-07-17: 10 mg via INTRAVENOUS
  Administered 2018-07-17 (×2): 5 mg via INTRAVENOUS

## 2018-07-17 MED ORDER — BISACODYL 10 MG RE SUPP: 10.0000 mg | Freq: Every day | RECTAL | Status: DC | PRN

## 2018-07-17 MED ORDER — HYDROCODONE-ACETAMINOPHEN 5-325 MG PO TABS
1.0000 | ORAL_TABLET | ORAL | Status: DC | PRN
  Administered 2018-07-17: 1 via ORAL
  Administered 2018-07-18 (×3): 2 via ORAL
  Filled 2018-07-17: qty 1
  Filled 2018-07-17 (×3): qty 2

## 2018-07-17 MED ORDER — CEFAZOLIN SODIUM-DEXTROSE 2-4 GM/100ML-% IV SOLN
2.0000 g | INTRAVENOUS | Status: AC
  Administered 2018-07-17: 2 g via INTRAVENOUS
  Filled 2018-07-17: qty 100

## 2018-07-17 MED ORDER — SODIUM CHLORIDE 0.9 % IR SOLN
Status: DC | PRN
  Administered 2018-07-17: 1000 mL

## 2018-07-17 MED ORDER — PROPOFOL 10 MG/ML IV BOLUS
INTRAVENOUS | Status: DC | PRN
  Administered 2018-07-17: 30 mg via INTRAVENOUS

## 2018-07-17 MED ORDER — FENTANYL CITRATE (PF) 100 MCG/2ML IJ SOLN
50.0000 ug | INTRAMUSCULAR | Status: DC
  Administered 2018-07-17: 50 ug via INTRAVENOUS
  Filled 2018-07-17: qty 2

## 2018-07-17 MED ORDER — METOCLOPRAMIDE HCL 5 MG PO TABS: 5.0000 mg | ORAL_TABLET | Freq: Three times a day (TID) | ORAL | Status: DC | PRN

## 2018-07-17 MED ORDER — CLONIDINE HCL (ANALGESIA) 100 MCG/ML EP SOLN
EPIDURAL | Status: DC | PRN
  Administered 2018-07-17: 50 ug

## 2018-07-17 MED ORDER — PHENOL 1.4 % MT LIQD
1.0000 | OROMUCOSAL | Status: DC | PRN
  Filled 2018-07-17: qty 177

## 2018-07-17 MED ORDER — KETOROLAC TROMETHAMINE 30 MG/ML IJ SOLN
INTRAMUSCULAR | Status: DC | PRN
  Administered 2018-07-17: 30 mg via INTRAVENOUS

## 2018-07-17 MED ORDER — BUPIVACAINE HCL (PF) 0.25 % IJ SOLN
INTRAMUSCULAR | Status: AC
  Filled 2018-07-17: qty 30

## 2018-07-17 MED ORDER — DEXAMETHASONE SODIUM PHOSPHATE 10 MG/ML IJ SOLN: 10.0000 mg | Freq: Once | INTRAMUSCULAR | Status: DC

## 2018-07-17 MED ORDER — PROPOFOL 500 MG/50ML IV EMUL
INTRAVENOUS | Status: DC | PRN
  Administered 2018-07-17: 75 ug/kg/min via INTRAVENOUS

## 2018-07-17 MED ORDER — TRANEXAMIC ACID-NACL 1000-0.7 MG/100ML-% IV SOLN
1000.0000 mg | Freq: Once | INTRAVENOUS | Status: AC
  Administered 2018-07-17: 1000 mg via INTRAVENOUS
  Filled 2018-07-17: qty 100

## 2018-07-17 MED ORDER — ASPIRIN 81 MG PO CHEW: 81.0000 mg | CHEWABLE_TABLET | Freq: Two times a day (BID) | ORAL | 0 refills | Status: AC

## 2018-07-17 MED ORDER — DEXAMETHASONE SODIUM PHOSPHATE 10 MG/ML IJ SOLN
10.0000 mg | Freq: Once | INTRAMUSCULAR | Status: AC
  Administered 2018-07-18: 10 mg via INTRAVENOUS
  Filled 2018-07-17: qty 1

## 2018-07-17 MED ORDER — LIDOCAINE 2% (20 MG/ML) 5 ML SYRINGE
INTRAMUSCULAR | Status: AC
  Filled 2018-07-17: qty 5

## 2018-07-17 MED ORDER — MIDAZOLAM HCL 2 MG/2ML IJ SOLN
1.0000 mg | INTRAMUSCULAR | Status: DC
  Administered 2018-07-17 (×2): 1 mg via INTRAVENOUS
  Filled 2018-07-17: qty 2

## 2018-07-17 MED ORDER — MAGNESIUM CITRATE PO SOLN: 1.0000 | Freq: Once | ORAL | Status: DC | PRN

## 2018-07-17 MED ORDER — HYDROCODONE-ACETAMINOPHEN 7.5-325 MG PO TABS: 1.0000 | ORAL_TABLET | ORAL | 0 refills | Status: DC | PRN

## 2018-07-17 MED ORDER — 0.9 % SODIUM CHLORIDE (POUR BTL) OPTIME
TOPICAL | Status: DC | PRN
  Administered 2018-07-17: 1000 mL

## 2018-07-17 MED ORDER — ROPIVACAINE HCL 7.5 MG/ML IJ SOLN
INTRAMUSCULAR | Status: DC | PRN
  Administered 2018-07-17: 20 mL via PERINEURAL

## 2018-07-17 MED ORDER — HYDROMORPHONE HCL 1 MG/ML IJ SOLN: 0.2500 mg | INTRAMUSCULAR | Status: DC | PRN

## 2018-07-17 MED ORDER — ACETAMINOPHEN 325 MG PO TABS: 325.0000 mg | ORAL_TABLET | Freq: Four times a day (QID) | ORAL | Status: DC | PRN

## 2018-07-17 MED ORDER — ONDANSETRON HCL 4 MG PO TABS: 4.0000 mg | ORAL_TABLET | Freq: Four times a day (QID) | ORAL | Status: DC | PRN

## 2018-07-17 SURGICAL SUPPLY — 56 items
ATTUNE MED ANAT PAT 29 KNEE (Knees) ×2 IMPLANT
ATTUNE MED ANAT PAT 29MM KNEE (Knees) ×1 IMPLANT
BAG ZIPLOCK 12X15 (MISCELLANEOUS) IMPLANT
BANDAGE ACE 6X5 VEL STRL LF (GAUZE/BANDAGES/DRESSINGS) ×3 IMPLANT
BASE TIBIAL ROT PLAT SZ 2 KNEE (Miscellaneous) ×1 IMPLANT
BLADE SAW SGTL 11.0X1.19X90.0M (BLADE) IMPLANT
BLADE SAW SGTL 13.0X1.19X90.0M (BLADE) ×3 IMPLANT
BOWL SMART MIX CTS (DISPOSABLE) ×3 IMPLANT
CEMENT HV SMART SET (Cement) ×3 IMPLANT
COMP FEM CMT ATTUNE SZ2 LT (Femur) ×3 IMPLANT
COMPONENT FEM CMT ATTUN SZ2 LT (Femur) ×1 IMPLANT
COVER SURGICAL LIGHT HANDLE (MISCELLANEOUS) ×3 IMPLANT
COVER WAND RF STERILE (DRAPES) ×3 IMPLANT
CUFF TOURN SGL QUICK 34 (TOURNIQUET CUFF) ×2
CUFF TRNQT CYL 34X4X40X1 (TOURNIQUET CUFF) ×1 IMPLANT
DECANTER SPIKE VIAL GLASS SM (MISCELLANEOUS) ×6 IMPLANT
DERMABOND ADVANCED (GAUZE/BANDAGES/DRESSINGS) ×2
DERMABOND ADVANCED .7 DNX12 (GAUZE/BANDAGES/DRESSINGS) ×1 IMPLANT
DRAPE U-SHAPE 47X51 STRL (DRAPES) ×3 IMPLANT
DRESSING AQUACEL AG SP 3.5X10 (GAUZE/BANDAGES/DRESSINGS) ×1 IMPLANT
DRSG AQUACEL AG ADV 3.5X10 (GAUZE/BANDAGES/DRESSINGS) ×3 IMPLANT
DRSG AQUACEL AG SP 3.5X10 (GAUZE/BANDAGES/DRESSINGS) ×3
DURAPREP 26ML APPLICATOR (WOUND CARE) ×6 IMPLANT
ELECT REM PT RETURN 15FT ADLT (MISCELLANEOUS) ×3 IMPLANT
GLOVE BIOGEL M 7.0 STRL (GLOVE) IMPLANT
GLOVE BIOGEL PI IND STRL 7.5 (GLOVE) ×1 IMPLANT
GLOVE BIOGEL PI IND STRL 8.5 (GLOVE) ×1 IMPLANT
GLOVE BIOGEL PI INDICATOR 7.5 (GLOVE) ×2
GLOVE BIOGEL PI INDICATOR 8.5 (GLOVE) ×2
GLOVE ECLIPSE 8.0 STRL XLNG CF (GLOVE) ×3 IMPLANT
GLOVE ORTHO TXT STRL SZ7.5 (GLOVE) ×6 IMPLANT
GOWN STRL REUS W/TWL 2XL LVL3 (GOWN DISPOSABLE) ×3 IMPLANT
GOWN STRL REUS W/TWL LRG LVL3 (GOWN DISPOSABLE) ×3 IMPLANT
HANDPIECE INTERPULSE COAX TIP (DISPOSABLE) ×2
HOLDER FOLEY CATH W/STRAP (MISCELLANEOUS) IMPLANT
INSERT TIBIAL RP ATTUNE SZ2X8 (Insert) ×3 IMPLANT
MANIFOLD NEPTUNE II (INSTRUMENTS) ×3 IMPLANT
NDL SAFETY ECLIPSE 18X1.5 (NEEDLE) IMPLANT
NEEDLE HYPO 18GX1.5 SHARP (NEEDLE)
PACK TOTAL KNEE CUSTOM (KITS) ×3 IMPLANT
PIN FIX SIGMA HP QUICK REL (PIN) ×3 IMPLANT
PIN THREADED HEADED SIGMA (PIN) ×3 IMPLANT
POSITIONER SURGICAL ARM (MISCELLANEOUS) ×3 IMPLANT
SET HNDPC FAN SPRY TIP SCT (DISPOSABLE) ×1 IMPLANT
SET PAD KNEE POSITIONER (MISCELLANEOUS) ×3 IMPLANT
SUT MNCRL AB 4-0 PS2 18 (SUTURE) ×3 IMPLANT
SUT STRATAFIX PDS+ 0 24IN (SUTURE) ×3 IMPLANT
SUT VIC AB 1 CT1 36 (SUTURE) ×3 IMPLANT
SUT VIC AB 2-0 CT1 27 (SUTURE) ×6
SUT VIC AB 2-0 CT1 TAPERPNT 27 (SUTURE) ×3 IMPLANT
SYRINGE 3CC LL L/F (MISCELLANEOUS) ×3 IMPLANT
TIBIAL BASE ROT PLAT SZ 2 KNEE (Miscellaneous) ×3 IMPLANT
TRAY FOLEY MTR SLVR 16FR STAT (SET/KITS/TRAYS/PACK) ×3 IMPLANT
WATER STERILE IRR 1000ML POUR (IV SOLUTION) ×3 IMPLANT
WRAP KNEE MAXI GEL POST OP (GAUZE/BANDAGES/DRESSINGS) ×3 IMPLANT
YANKAUER SUCT BULB TIP 10FT TU (MISCELLANEOUS) ×3 IMPLANT

## 2018-07-17 NOTE — Anesthesia Preprocedure Evaluation (Signed)
Anesthesia Evaluation  Patient identified by MRN, date of birth, ID band Patient awake    Reviewed: Allergy & Precautions, NPO status , Patient's Chart, lab work & pertinent test results  Airway Mallampati: II  TM Distance: >3 FB Neck ROM: Full    Dental no notable dental hx.    Pulmonary neg pulmonary ROS,    Pulmonary exam normal breath sounds clear to auscultation       Cardiovascular negative cardio ROS Normal cardiovascular exam Rhythm:Regular Rate:Normal     Neuro/Psych Anxiety negative neurological ROS     GI/Hepatic Neg liver ROS, GERD  ,  Endo/Other  Hypothyroidism   Renal/GU negative Renal ROS  negative genitourinary   Musculoskeletal negative musculoskeletal ROS (+)   Abdominal   Peds negative pediatric ROS (+)  Hematology negative hematology ROS (+)   Anesthesia Other Findings   Reproductive/Obstetrics negative OB ROS                             Anesthesia Physical Anesthesia Plan  ASA: II  Anesthesia Plan: Spinal   Post-op Pain Management:    Induction: Intravenous  PONV Risk Score and Plan: 2 and Ondansetron and Dexamethasone  Airway Management Planned: Simple Face Mask  Additional Equipment:   Intra-op Plan:   Post-operative Plan:   Informed Consent: I have reviewed the patients History and Physical, chart, labs and discussed the procedure including the risks, benefits and alternatives for the proposed anesthesia with the patient or authorized representative who has indicated his/her understanding and acceptance.   Dental advisory given  Plan Discussed with: CRNA and Surgeon  Anesthesia Plan Comments:         Anesthesia Quick Evaluation

## 2018-07-17 NOTE — Anesthesia Procedure Notes (Addendum)
Spinal  Patient location during procedure: OR Start time: 07/17/2018 12:25 PM End time: 07/17/2018 12:30 PM Staffing Anesthesiologist: Myrtie Soman, MD Performed: anesthesiologist  Preanesthetic Checklist Completed: patient identified, site marked, surgical consent, pre-op evaluation, timeout performed, IV checked, risks and benefits discussed and monitors and equipment checked Spinal Block Patient position: sitting Prep: ChloraPrep Patient monitoring: heart rate, continuous pulse ox and blood pressure Location: L3-4 Injection technique: single-shot Needle Needle type: Sprotte  Needle gauge: 24 G Needle length: 9 cm Additional Notes Expiration date of kit checked and confirmed. Patient tolerated procedure well, without complications.

## 2018-07-17 NOTE — Progress Notes (Signed)
Assisted Dr. Myrtie Soman with left adductor canal block. Side rails up, monitors on throughout procedure. See vital signs in flow sheet. Tolerated Procedure well.

## 2018-07-17 NOTE — Anesthesia Procedure Notes (Signed)
Anesthesia Procedure Image    

## 2018-07-17 NOTE — Transfer of Care (Signed)
Immediate Anesthesia Transfer of Care Note  Patient: Monique Thomas  Procedure(s) Performed: LEFT TOTAL KNEE ARTHROPLASTY (Left Knee)  Patient Location: PACU  Anesthesia Type:Spinal  Level of Consciousness: awake  Airway & Oxygen Therapy: Patient Spontanous Breathing and Patient connected to nasal cannula oxygen  Post-op Assessment: Report given to RN and Post -op Vital signs reviewed and stable  Post vital signs: Reviewed and stable  Last Vitals:  Vitals Value Taken Time  BP 103/57 07/17/2018  2:06 PM  Temp    Pulse 75 07/17/2018  2:07 PM  Resp 14 07/17/2018  2:07 PM  SpO2 100 % 07/17/2018  2:07 PM  Vitals shown include unvalidated device data.  Last Pain:  Vitals:   07/17/18 1029  TempSrc:   PainSc: 8          Complications: No apparent anesthesia complications

## 2018-07-17 NOTE — Anesthesia Procedure Notes (Signed)
Date/Time: 07/17/2018 12:26 PM Performed by: Sharlette Dense, CRNA Oxygen Delivery Method: Simple face mask

## 2018-07-17 NOTE — Anesthesia Procedure Notes (Signed)
Anesthesia Regional Block: Adductor canal block   Pre-Anesthetic Checklist: ,, timeout performed, Correct Patient, Correct Site, Correct Laterality, Correct Procedure, Correct Position, site marked, Risks and benefits discussed,  Surgical consent,  Pre-op evaluation,  At surgeon's request and post-op pain management  Laterality: Left  Prep: chloraprep       Needles:  Injection technique: Single-shot  Needle Type: Echogenic Needle     Needle Length: 9cm      Additional Needles:   Procedures:,,,, ultrasound used (permanent image in chart),,,,  Narrative:  Start time: 07/17/2018 11:31 AM End time: 07/17/2018 11:38 AM Injection made incrementally with aspirations every 5 mL.  Performed by: Personally  Anesthesiologist: Myrtie Soman, MD  Additional Notes: Patient tolerated the procedure well without complications

## 2018-07-17 NOTE — Op Note (Signed)
NAME:  Monique Thomas                      MEDICAL RECORD NO.:  [id number]                             FACILITY:  [facility]      PHYSICIAN:  Pietro Cassis. Alvan Dame, M.D.  DATE OF BIRTH:  1951/07/03      DATE OF PROCEDURE:  07/17/2018                                     OPERATIVE REPORT         PREOPERATIVE DIAGNOSIS:  Left knee osteoarthritis.      POSTOPERATIVE DIAGNOSIS:  Left knee osteoarthritis.      FINDINGS:  The patient was noted to have complete loss of cartilage and   bone-on-bone arthritis with associated osteophytes in the lateral and patellofemoral compartments of   the knee with a significant synovitis and associated effusion.  The patient had failed months of conservative treatment including medications, injection therapy, activity modification.     PROCEDURE:  Left total knee replacement.      COMPONENTS USED:  DePuy Attune rotating platform posterior stabilized knee   system, a size 2 femur, 2 tibia, size 8 mm PS AOX insert, and 29 anatomic patellar   button.      SURGEON:  Pietro Cassis. Alvan Dame, M.D.      ASSISTANT:  Danae Orleans, PA-C.      ANESTHESIA:  Regional and Spinal.      SPECIMENS:  None.      COMPLICATION:  None.      DRAINS:  None.  EBL: <100cc      TOURNIQUET TIME:  51min @ 232mmHg     The patient was stable to the recovery room.      INDICATION FOR PROCEDURE:  Monique Thomas is a 67 y.o. female patient of   mine.  The patient had been seen, evaluated, and treated for months conservatively in the   office with medication, activity modification, and injections.  The patient had   radiographic changes of bone-on-bone arthritis with endplate sclerosis and osteophytes noted.  Based on the radiographic changes and failed conservative measures, the patient   decided to proceed with definitive treatment, total knee replacement.  Risks of infection, DVT, component failure, need for revision surgery, neurovascular injury were reviewed in the office setting.  The  postop course was reviewed stressing the efforts to maximize post-operative satisfaction and function.  Consent was obtained for benefit of pain   relief.      PROCEDURE IN DETAIL:  The patient was brought to the operative theater.   Once adequate anesthesia, preoperative antibiotics, 2 gm of Ancef,1 gm of Tranexamic Acid, and 10 mg of Decadron administered, the patient was positioned supine with a left thigh tourniquet placed.  The  left lower extremity was prepped and draped in sterile fashion.  A time-   out was performed identifying the patient, planned procedure, and the appropriate extremity.      The left lower extremity was placed in the [facility] leg holder.  The leg was   exsanguinated, tourniquet elevated to 250 mmHg.  A midline incision was   made followed by median parapatellar arthrotomy.  Following initial   exposure, attention was first directed to  the patella.  Precut   measurement was noted to be 20 mm.  I resected down to 13 mm and used a   29 anatomic patellar button to restore patellar height as well as cover the cut surface.      The lug holes were drilled and a metal shim was placed to protect the   patella from retractors and saw blade during the procedure.      At this point, attention was now directed to the femur.  The femoral   canal was opened with a drill, irrigated to try to prevent fat emboli.  An   intramedullary rod was passed at 3 degrees valgus, 9 mm of bone was   resected off the distal femur.  Following this resection, the tibia was   subluxated anteriorly.  Using the extramedullary guide, 4 mm of bone was resected off   the proximal lateral tibia.  We confirmed the gap would be   stable medially and laterally with a size 6 spacer block as well as confirmed that the tibial cut was perpendicular in the coronal plane, checking with an alignment rod.      Once this was done, I sized the femur to be a size 2 in the anterior-   posterior dimension, chose a  standard component based on medial and   lateral dimension.  The size 2 rotation block was then pinned in   position anterior referenced using the C-clamp to set rotation.  The   anterior, posterior, and  chamfer cuts were made without difficulty nor   notching making certain that I was along the anterior cortex to help   with flexion gap stability.      The final box cut was made off the lateral aspect of distal femur.      At this point, the tibia was sized to be a size 2.  The size 2 tray was   then pinned in position through the medial third of the tubercle,   drilled, and keel punched.  Trial reduction was now carried with a 2 femur,  2 tibia, a size 6 up to the 8 mm PS insert, and the 29 anatomic patella botton.  The knee was brought to full extension with good flexion stability with the patella   tracking through the trochlea without application of pressure.  Given   all these findings the trial components removed.  Final components were   opened and cement was mixed.  The knee was irrigated with normal saline solution and pulse lavage.  The synovial lining was   then injected with 30 cc of 0.25% Marcaine with epinephrine, 1 cc of Toradol and 30 cc of NS for a total of 61 cc.     Final implants were then cemented onto cleaned and dried cut surfaces of bone with the knee brought to extension with a size 8 mm PS trial insert.      Once the cement had fully cured, excess cement was removed   throughout the knee.  I confirmed that I was satisfied with the range of   motion and stability, and the final size 8 mm PS AOX insert was chosen.  It was   placed into the knee.      The tourniquet had been let down at 25 minutes.  No significant   hemostasis was required.  The extensor mechanism was then reapproximated using #1 Vicryl and #1 Stratafix sutures with the knee   in flexion.  The  remaining wound was closed with 2-0 Vicryl and running 4-0 Monocryl.   The knee was cleaned, dried,  dressed sterilely using Dermabond and   Aquacel dressing.  The patient was then   brought to recovery room in stable condition, tolerating the procedure   well.   Please note that Physician Assistant, Danae Orleans, PA-C was present for the entirety of the case, and was utilized for pre-operative positioning, peri-operative retractor management, general facilitation of the procedure and for primary wound closure at the end of the case.              Pietro Cassis Alvan Dame, M.D.    07/17/2018 1:45 PM

## 2018-07-17 NOTE — Discharge Instructions (Signed)

## 2018-07-17 NOTE — Anesthesia Postprocedure Evaluation (Signed)
Anesthesia Post Note  Patient: Tannis Burstein Player  Procedure(s) Performed: LEFT TOTAL KNEE ARTHROPLASTY (Left Knee)     Patient location during evaluation: PACU Anesthesia Type: Spinal Level of consciousness: oriented and awake and alert Pain management: pain level controlled Vital Signs Assessment: post-procedure vital signs reviewed and stable Respiratory status: spontaneous breathing, respiratory function stable and patient connected to nasal cannula oxygen Cardiovascular status: blood pressure returned to baseline and stable Postop Assessment: no headache, no backache and no apparent nausea or vomiting Anesthetic complications: no    Last Vitals:  Vitals:   07/17/18 1545 07/17/18 1600  BP: 109/67 (!) 111/58  Pulse: 67 66  Resp: 17 13  Temp:    SpO2: 100% 100%    Last Pain:  Vitals:   07/17/18 1600  TempSrc:   PainSc: 0-No pain    LLE Motor Response: Purposeful movement (07/17/18 1600) LLE Sensation: Numbness (07/17/18 1600) RLE Motor Response: Purposeful movement (07/17/18 1600) RLE Sensation: Tingling (07/17/18 1600) L Sensory Level: S3-Medial thigh (07/17/18 1600) R Sensory Level: S3-Medial thigh (07/17/18 1600)  Rhyan Wolters S

## 2018-07-17 NOTE — Interval H&P Note (Signed)
History and Physical Interval Note:  07/17/2018 11:04 AM  Monique Thomas  has presented today for surgery, with the diagnosis of Left knee osteoarthritis  The various methods of treatment have been discussed with the patient and family. After consideration of risks, benefits and other options for treatment, the patient has consented to  Procedure(s) with comments: LEFT TOTAL KNEE ARTHROPLASTY (Left) - 70 mins as a surgical intervention .  The patient's history has been reviewed, patient examined, no change in status, stable for surgery.  I have reviewed the patient's chart and labs.  Questions were answered to the patient's satisfaction.     Mauri Pole

## 2018-07-18 DIAGNOSIS — M1712 Unilateral primary osteoarthritis, left knee: Secondary | ICD-10-CM | POA: Diagnosis not present

## 2018-07-18 LAB — BASIC METABOLIC PANEL
ANION GAP: 4 — AB (ref 5–15)
BUN: 12 mg/dL (ref 8–23)
CALCIUM: 8.1 mg/dL — AB (ref 8.9–10.3)
CO2: 25 mmol/L (ref 22–32)
Chloride: 108 mmol/L (ref 98–111)
Creatinine, Ser: 0.56 mg/dL (ref 0.44–1.00)
GFR calc Af Amer: >60 mL/min (ref >60)
GLUCOSE: 124 mg/dL — AB (ref 70–99)
Potassium: 3.9 mmol/L (ref 3.5–5.1)
Sodium: 137 mmol/L (ref 135–145)

## 2018-07-18 LAB — CBC
HCT: 28.3 % — ABNORMAL LOW (ref 36.0–46.0)
Hemoglobin: 9.3 g/dL — ABNORMAL LOW (ref 12.0–15.0)
MCH: 30.1 pg (ref 26.0–34.0)
MCHC: 32.9 g/dL (ref 30.0–36.0)
MCV: 91.6 fL (ref 80.0–100.0)
NRBC: 0 % (ref 0.0–0.2)
PLATELETS: 304 10*3/uL (ref 150–400)
RBC: 3.09 MIL/uL — ABNORMAL LOW (ref 3.87–5.11)
RDW: 13.1 % (ref 11.5–15.5)
WBC: 8.8 10*3/uL (ref 4.0–10.5)

## 2018-07-18 NOTE — Progress Notes (Signed)
Physical Therapy Treatment Patient Details Name: Monique Thomas Player MRN: [id number] DOB: June 07, 1951 Today's Date: 07/18/2018    History of Present Illness L TKA, recent XLIF L1-2    PT Comments    Patient  Continues to progress. Ready for DC. RN aware.   Follow Up Recommendations  Home health PT     Equipment Recommendations  None recommended by PT    Recommendations for Other Services       Precautions / Restrictions Precautions Precautions: Knee;Fall;Back Restrictions Weight Bearing Restrictions: No    Mobility  Bed Mobility Overal bed mobility: Independent                Transfers Overall transfer level: Needs assistance Equipment used: 4-wheeled walker Transfers: Sit to/from Stand Sit to Stand: Supervision         General transfer comment: patient found standing at bedside without RW as urgent to go to BR and noone had come to assist.  Ambulation/Gait Ambulation/Gait assistance: Supervision Gait Distance (Feet): 200 Feet Assistive device: Rolling walker (2 wheeled) Gait Pattern/deviations: Step-through pattern;Antalgic;Decreased stance time - left     General Gait Details: cues for sequence   Stairs Stairs: Yes Stairs assistance: Min guard Stair Management: Two rails;Step to pattern;Forwards Number of Stairs: 4 General stair comments: cues for sequence   Wheelchair Mobility    Modified Rankin (Stroke Patients Only)       Balance Overall balance assessment: No apparent balance deficits (not formally assessed)                                          Cognition Arousal/Alertness: Awake/alert Behavior During Therapy: WFL for tasks assessed/performed Overall Cognitive Status: Within Functional Limits for tasks assessed                                        Exercises     General Comments        Pertinent Vitals/Pain Pain Assessment: 0-10 Pain Score: 3  Pain Location: left knee Pain  Descriptors / Indicators: Sore Pain Intervention(s): Monitored during session;Premedicated before session    Home Living Family/patient expects to be discharged to:: Private residence Living Arrangements: Spouse/significant other Available Help at Discharge: Family Type of Home: House Home Access: Stairs to enter Entrance Stairs-Rails: Left Home Layout: One level Home Equipment: Environmental consultant - 4 wheels;Cane - single point      Prior Function Level of Independence: Independent          PT Goals (current goals can now be found in the care plan section) Acute Rehab PT Goals Patient Stated Goal: go home PT Goal Formulation: With patient Time For Goal Achievement: 07/20/18 Potential to Achieve Goals: Good Progress towards PT goals: Progressing toward goals    Frequency    7X/week      PT Plan Current plan remains appropriate    Co-evaluation              AM-PAC PT "6 Clicks" Daily Activity  Outcome Measure  Difficulty turning over in bed (including adjusting bedclothes, sheets and blankets)?: None Difficulty moving from lying on back to sitting on the side of the bed? : None Difficulty sitting down on and standing up from a chair with arms (e.g., wheelchair, bedside commode, etc,.)?: A Little Help needed moving to and  from a bed to chair (including a wheelchair)?: A Little Help needed walking in hospital room?: A Little Help needed climbing 3-5 steps with a railing? : A Little 6 Click Score: 20    End of Session   Activity Tolerance: Patient tolerated treatment well Patient left: in chair;with call bell/phone within reach;with family/visitor present Nurse Communication: Mobility status PT Visit Diagnosis: Unsteadiness on feet (R26.81)     Time: 1040-1110 PT Time Calculation (min) (ACUTE ONLY): 30 min  Charges:  $Gait Training: 8-22 mins $ $Self Care/Home Management: [place] Pager  [phone] Office [phone]    Claretha Cooper 07/18/2018, 11:26 AM

## 2018-07-18 NOTE — Progress Notes (Signed)
     Subjective: 1 Day Post-Op Procedure(s) (LRB): LEFT TOTAL KNEE ARTHROPLASTY (Left)   Patient reports pain as mild, pain controlled. No events throughout the night. Looking forward to progressing / improving.  Discussed no ride to PT and will arrange HHPT for 2 weeks.  Ready to be discharged home.    Patient's anticipated LOS is less than 2 midnights, meeting these requirements: - Lives within 1 hour of care - Has a competent adult at home to recover with post-op recover - NO history of  - Diabetes  - Coronary Artery Disease  - Heart failure  - Heart attack  - Stroke  - DVT/VTE  - Cardiac arrhythmia  - Respiratory Failure/COPD  - Renal failure  - Advanced Liver disease    Objective:   VITALS:   Vitals:   07/18/18 0144 07/18/18 0555  BP: (!) 98/51 (!) 101/48  Pulse: 81 73  Resp: 16 17  Temp: 98.2 F (36.8 C) 97.9 F (36.6 C)  SpO2: 100% 100%    Dorsiflexion/Plantar flexion intact Incision: dressing C/D/I No cellulitis present Compartment soft  LABS Recent Labs    07/18/18 0521  HGB 9.3*  HCT 28.3*  WBC 8.8  PLT 304    Recent Labs    07/18/18 0521  NA 137  K 3.9  BUN 12  CREATININE 0.56  GLUCOSE 124*     Assessment/Plan: 1 Day Post-Op Procedure(s) (LRB): LEFT TOTAL KNEE ARTHROPLASTY (Left) Foley cath d/c'ed Advance diet Up with therapy D/C IV fluids Discharge home with home health Follow [facility] 2 weeks at [facility] ([place] Follow up with OLIN,Gustaf Mccarter D in 2 weeks.  Contact information:  [facility]) [address], [address] [place]. Idil Maslanka   PAC  07/18/2018, 8:06 AM

## 2018-07-18 NOTE — Evaluation (Signed)
Physical Therapy Evaluation Patient Details Name: Monique Thomas MRN: [id number] DOB: 02-Mar-1951 Today's Date: 07/18/2018   History of Present Illness  L TKA, recent XLIF L1-2  Clinical Impression  The patient is progressing well. Plans DC after next PT visit. Pt admitted with above diagnosis. Pt currently with functional limitations due to the deficits listed below (see PT Problem List).  Pt will benefit from skilled PT to increase their independence and safety with mobility to allow discharge to the venue listed below.       Follow Up Recommendations Home health PT    Equipment Recommendations  None recommended by PT    Recommendations for Other Services       Precautions / Restrictions Precautions Precautions: Knee;Fall Restrictions Weight Bearing Restrictions: No      Mobility  Bed Mobility Overal bed mobility: Independent                Transfers Overall transfer level: Needs assistance Equipment used: 4-wheeled walker Transfers: Sit to/from Stand Sit to Stand: Supervision         General transfer comment: cues for hand placement, tolerates left knee flexion.  Ambulation/Gait Ambulation/Gait assistance: Min guard Gait Distance (Feet): 100 Feet Assistive device: 4-wheeled walker Gait Pattern/deviations: Step-through pattern     General Gait Details: cues for sequence  Stairs            Wheelchair Mobility    Modified Rankin (Stroke Patients Only)       Balance Overall balance assessment: No apparent balance deficits (not formally assessed)                                           Pertinent Vitals/Pain Pain Assessment: 0-10 Pain Score: 4  Pain Location: left knee Pain Descriptors / Indicators: Sore Pain Intervention(s): Monitored during session;Premedicated before session;Ice applied;Limited activity within patient's tolerance    Home Living Family/patient expects to be discharged to:: Private  residence Living Arrangements: Spouse/significant other Available Help at Discharge: Family Type of Home: House Home Access: Stairs to enter Entrance Stairs-Rails: Left Entrance Stairs-Number of Steps: 3 Home Layout: One level Home Equipment: [place] - 4 wheels;Cane - single point      Prior Function Level of Independence: Independent               Hand Dominance        Extremity/Trunk Assessment   Upper Extremity Assessment Upper Extremity Assessment: Overall WFL for tasks assessed    Lower Extremity Assessment Lower Extremity Assessment: LLE deficits/detail LLE Deficits / Details: flexion of knee 5-100degr, + SLR       Communication   Communication: No difficulties  Cognition Arousal/Alertness: Awake/alert Behavior During Therapy: WFL for tasks assessed/performed Overall Cognitive Status: Within Functional Limits for tasks assessed                                        General Comments      Exercises Total Joint Exercises Ankle Circles/Pumps: AROM;Both;10 reps Quad Sets: 10 reps;Both;AROM Short Arc Quad: AROM;Left;10 reps Heel Slides: AROM;Left;10 reps Hip ABduction/ADduction: AROM;Left;10 reps Straight Leg Raises: AROM;Left;10 reps Long Arc Quad: AROM;Left;10 reps Knee Flexion: AROM;Left;10 reps   Assessment/Plan    PT Assessment Patient needs continued PT services  PT Problem List Decreased strength;Decreased range of motion;Decreased  activity tolerance;Decreased mobility;Decreased knowledge of precautions;Decreased safety awareness;Decreased knowledge of use of DME;Pain       PT Treatment Interventions DME instruction;Therapeutic exercise;Gait training;Stair training;Functional mobility training;Therapeutic activities    PT Goals (Current goals can be found in the Care Plan section)  Acute Rehab PT Goals Patient Stated Goal: go home PT Goal Formulation: With patient Time For Goal Achievement: 07/20/18 Potential to Achieve  Goals: Good    Frequency 7X/week   Barriers to discharge        Co-evaluation               AM-PAC PT "6 Clicks" Daily Activity  Outcome Measure Difficulty turning over in bed (including adjusting bedclothes, sheets and blankets)?: None Difficulty moving from lying on back to sitting on the side of the bed? : None Difficulty sitting down on and standing up from a chair with arms (e.g., wheelchair, bedside commode, etc,.)?: A Little Help needed moving to and from a bed to chair (including a wheelchair)?: A Little Help needed walking in hospital room?: A Little Help needed climbing 3-5 steps with a railing? : A Little 6 Click Score: 20    End of Session   Activity Tolerance: Patient tolerated treatment well Patient left: in bed;with call bell/phone within reach;with family/visitor present Nurse Communication: Mobility status PT Visit Diagnosis: Unsteadiness on feet (R26.81)    Time: 2248-2500 PT Time Calculation (min) (ACUTE ONLY): 57 min   Charges:   PT Evaluation $PT Eval Low Complexity: 1 Low PT Treatments $Gait Training: 8-22 mins $Therapeutic Exercise: 8-22 mins $Self Care/Home Management: [place] Pager [phone] Office [phone]   Claretha Cooper 07/18/2018, 11:23 AM

## 2018-07-18 NOTE — Care Management Note (Signed)
Case Management Note  Patient Details  Name: Monique Thomas MRN: [id number] Date of Birth: 1951-05-24  Subjective/Objective:      Discharge planning, spoke with patient and spouse at bedside. Have chosen [facility] for [facility] PT, evaluate and treat.   Action/Plan: Contacted [facility] for referral. They have accepted  Has a RW and 3n1. [phone]               Expected Discharge Date:  07/18/18               Expected Discharge Plan:  [place]  In-House Referral:  NA  Discharge planning Services  CM Consult  Post Acute Care Choice:  Home Health Choice offered to:  Patient, Spouse  DME Arranged:  N/A DME Agency:  NA  HH Arranged:  PT [place] Agency:  [facility] (formerly [employer])  Status of Service:  Completed, signed off  If discussed at [employer], dates discussed:    Additional Comments:  Guadalupe Maple, RN 07/18/2018, 11:41 AM

## 2018-07-18 NOTE — Plan of Care (Signed)
  Problem: Education: Goal: Knowledge of General Education information will improve Description: Including pain rating scale, medication(s)/side effects and non-pharmacologic comfort measures Outcome: Progressing   Problem: Health Behavior/Discharge Planning: Goal: Ability to manage health-related needs will improve Outcome: Progressing   Problem: Clinical Measurements: Goal: Ability to maintain clinical measurements within normal limits will improve Outcome: Progressing Goal: Will remain free from infection Outcome: Progressing Goal: Diagnostic test results will improve Outcome: Progressing Goal: Respiratory complications will improve Outcome: Progressing Goal: Cardiovascular complication will be avoided Outcome: Progressing   Problem: Activity: Goal: Risk for activity intolerance will decrease Outcome: Progressing   Problem: Nutrition: Goal: Adequate nutrition will be maintained Outcome: Progressing   Problem: Coping: Goal: Level of anxiety will decrease Outcome: Progressing   Problem: Elimination: Goal: Will not experience complications related to bowel motility Outcome: Progressing Goal: Will not experience complications related to urinary retention Outcome: Progressing   Problem: Pain Managment: Goal: General experience of comfort will improve Outcome: Progressing   Problem: Safety: Goal: Ability to remain free from injury will improve Outcome: Progressing   Problem: Skin Integrity: Goal: Risk for impaired skin integrity will decrease Outcome: Progressing   Problem: Education: Goal: Knowledge of the prescribed therapeutic regimen will improve Outcome: Progressing Goal: Individualized Educational Video(s) Outcome: Progressing   Problem: Activity: Goal: Ability to avoid complications of mobility impairment will improve Outcome: Progressing Goal: Range of joint motion will improve Outcome: Progressing   Problem: Pain Management: Goal: Pain level will  decrease with appropriate interventions Outcome: Progressing   Problem: Skin Integrity: Goal: Will show signs of wound healing Outcome: Progressing   

## 2018-07-18 NOTE — Addendum Note (Signed)
Addendum  created 07/18/18 0716 by Lollie Sails, CRNA   Charge Capture section accepted

## 2018-07-19 ENCOUNTER — Encounter (HOSPITAL_COMMUNITY): Payer: Self-pay | Admitting: Orthopedic Surgery

## 2018-07-24 NOTE — Discharge Summary (Signed)
Physician Discharge Summary  Patient ID: Monique Thomas MRN: [id number] DOB/AGE: [birth year] 67 y.o.  Admit date: 07/17/2018 Discharge date: 07/18/2018   Procedures:  Procedure(s) (LRB): LEFT TOTAL KNEE ARTHROPLASTY (Left)  Attending Physician:  Dr. Paralee Cancel   Admission Diagnoses:   Left knee primary OA / pain  Discharge Diagnoses:  Active Problems:   S/P total knee replacement  Past Medical History:  Diagnosis Date  . Anemia   . Anxiety   . Arthritis   . Back pain, chronic   . Cancer ([place])  2 weeks ago    skin cancer  left anterior wrist   and right side of  face     . Depression   . GERD (gastroesophageal reflux disease)   . Hyperlipidemia   . Hypothyroidism   . Kidney stone    2015  . Thyroid disease   . Umbilical hernia   . Unspecified vitamin D deficiency     HPI:    Monique Thomas, 67 y.o. female, has a history of pain and functional disability in the left knee due to arthritis and has failed non-surgical conservative treatments for greater than 12 weeks to include NSAID's and/or analgesics, corticosteriod injections, viscosupplementation injections and activity modification.  Onset of symptoms was gradual, starting ~1 years ago with gradually worsening course since that time. The patient noted prior procedures on the knee to include  arthroscopy and menisectomy on the left knee(s).  Patient currently rates pain in the left knee(s) at 8 out of 10 with activity. Patient has night pain, worsening of pain with activity and weight bearing, pain that interferes with activities of daily living, pain with passive range of motion, crepitus and joint swelling.  Patient has evidence of periarticular osteophytes and joint space narrowing by imaging studies.  There is no active infection.  Risks, benefits and expectations were discussed with the patient.  Risks including but not limited to the risk of anesthesia, blood clots, nerve damage, blood vessel damage, failure of the  prosthesis, infection and up to and including death.  Patient understand the risks, benefits and expectations and wishes to proceed with surgery.   PCP: Monique Pinto, MD   Discharged Condition: good  Hospital Course:  Patient underwent the above stated procedure on 07/17/2018. Patient tolerated the procedure well and brought to the recovery room in good condition and subsequently to the floor.  POD #1 BP: 101/48 ; Pulse: 73 ; Temp: 97.9 F (36.6 C) ; Resp: 17 Patient reports pain as mild, pain controlled. No events throughout the night. Looking forward to progressing / improving.  Discussed no ride to PT and will arrange HHPT for 2 weeks.  Ready to be discharged home. Dorsiflexion/plantar flexion intact, incision: dressing C/D/I, no cellulitis present and compartment soft.   LABS  Basename    HGB     9.3  HCT     28.3    Discharge Exam: General appearance: alert, cooperative and no distress Extremities: Homans sign is negative, no sign of DVT, no edema, redness or tenderness in the calves or thighs and no ulcers, gangrene or trophic changes  Disposition:  Home with follow up in 2 weeks   Follow-up Information    Paralee Cancel, MD. Schedule an appointment as soon as possible for a visit in 2 weeks.   Specialty:  Orthopedic Surgery Contact information: [address] [place] [address] [phone]        [facility].   Specialty:  Home Health Services Why:  physical therapy Contact information: [address] [place] [address] [phone]           Discharge Instructions    Call MD / Call 911   Complete by:  As directed    If you experience chest pain or shortness of breath, CALL 911 and be transported to the hospital emergency room.  If you develope a fever above 101 F, pus (white drainage) or increased drainage or redness at the wound, or calf pain, call your surgeon's office.   Change dressing   Complete by:  As  directed    Maintain surgical dressing until follow up in the clinic. If the edges start to pull up, may reinforce with tape. If the dressing is no longer working, may remove and cover with gauze and tape, but must keep the area dry and clean.  Call with any questions or concerns.   Constipation Prevention   Complete by:  As directed    Drink plenty of fluids.  Prune juice may be helpful.  You may use a stool softener, such as Colace (over the counter) 100 mg twice a day.  Use MiraLax (over the counter) for constipation as needed.   Diet - low sodium heart healthy   Complete by:  As directed    Discharge instructions   Complete by:  As directed    Maintain surgical dressing until follow up in the clinic. If the edges start to pull up, may reinforce with tape. If the dressing is no longer working, may remove and cover with gauze and tape, but must keep the area dry and clean.  Follow [facility] in 2 [facility]. Call with any questions or concerns.   Increase activity slowly as tolerated   Complete by:  As directed    Weight bearing as tolerated with assist device (walker, cane, etc) as directed, use it as long as suggested by your surgeon or therapist, typically at least 4-6 weeks.   TED hose   Complete by:  As directed    Use stockings (TED hose) for 2 weeks on both leg(s).  You may remove them at night for sleeping.      Allergies as of 07/18/2018      Reactions   Celebrex [celecoxib]    "panic attack"   Other    Skin bruises very easily and peels back   Topamax [topiramate]    MEMORY   Entex Lq [phenylephrine-guaifenesin] Rash   Fiorinal [butalbital-aspirin-caffeine] Rash      Medication List    STOP taking these medications   estradiol 1 MG tablet Commonly known as:  ESTRACE   fluconazole 150 MG tablet Commonly known as:  DIFLUCAN   MAGNESIUM PO   naproxen sodium 220 MG tablet Commonly known as:  ALEVE   sulfamethoxazole-trimethoprim 800-160 MG  tablet Commonly known as:  BACTRIM DS,SEPTRA DS     TAKE these medications   ALPRAZolam 1 MG tablet Commonly known as:  XANAX Take 1/2 to 1 tablet 2 to 3 x / day ONLY if needed for Anxiety Attack & please try to limit to 5 days /week to avoid addiction   aspirin 81 MG chewable tablet Chew 1 tablet (81 mg total) by mouth 2 (two) times daily. Take for 4 weeks, then resume regular dose.   B-12 PO Take 1 tablet by mouth daily.   cyclobenzaprine 5 MG tablet Commonly known as:  FLEXERIL Take 1 tablet (5 mg  total) by mouth 3 (three) times daily as needed for muscle spasms. What changed:  when to take this   docusate sodium 100 MG capsule Commonly known as:  COLACE Take 1 capsule (100 mg total) by mouth 2 (two) times daily.   ferrous sulfate 325 (65 FE) MG tablet Take 1 tablet (325 mg total) by mouth 3 (three) times daily with meals.   HYDROcodone-acetaminophen 7.5-325 MG tablet Commonly known as:  NORCO Take 1-2 tablets by mouth every 4 (four) hours as needed for moderate pain.   HYDROcodone-acetaminophen 7.5-325 MG tablet Commonly known as:  NORCO Take 1-2 tablets by mouth every 4 (four) hours as needed for moderate pain.   polyethylene glycol packet Commonly known as:  MIRALAX / GLYCOLAX Take 17 g by mouth 2 (two) times daily.   SYNTHROID 75 MCG tablet Generic drug:  levothyroxine Take 75 mcg by mouth daily before breakfast.   VITAMIN D3 PO Take 1 tablet by mouth daily.            Discharge Care Instructions  (From admission, onward)         Start     Ordered   07/18/18 0000  Change dressing    Comments:  Maintain surgical dressing until follow up in the clinic. If the edges start to pull up, may reinforce with tape. If the dressing is no longer working, may remove and cover with gauze and tape, but must keep the area dry and clean.  Call with any questions or concerns.   07/18/18 6761           Signed: [place]. Mittie Knittel   PA-C  07/24/2018, 9:41 AM

## 2018-08-21 LAB — HM MAMMOGRAPHY

## 2018-08-27 ENCOUNTER — Encounter: Payer: Self-pay | Admitting: *Deleted

## 2018-11-21 ENCOUNTER — Ambulatory Visit: Payer: Medicare Other | Admitting: Internal Medicine

## 2018-11-21 ENCOUNTER — Encounter: Payer: Self-pay | Admitting: Internal Medicine

## 2018-11-21 VITALS — BP 118/62 | HR 76 | Temp 97.1°F | Resp 16 | Ht 61.0 in | Wt 132.2 lb

## 2018-11-21 DIAGNOSIS — G2581 Restless legs syndrome: Secondary | ICD-10-CM

## 2018-11-21 DIAGNOSIS — R0989 Other specified symptoms and signs involving the circulatory and respiratory systems: Secondary | ICD-10-CM | POA: Diagnosis not present

## 2018-11-21 DIAGNOSIS — E559 Vitamin D deficiency, unspecified: Secondary | ICD-10-CM

## 2018-11-21 DIAGNOSIS — R7303 Prediabetes: Secondary | ICD-10-CM

## 2018-11-21 DIAGNOSIS — E782 Mixed hyperlipidemia: Secondary | ICD-10-CM

## 2018-11-21 DIAGNOSIS — E039 Hypothyroidism, unspecified: Secondary | ICD-10-CM

## 2018-11-21 DIAGNOSIS — F419 Anxiety disorder, unspecified: Secondary | ICD-10-CM

## 2018-11-21 DIAGNOSIS — Z79899 Other long term (current) drug therapy: Secondary | ICD-10-CM

## 2018-11-21 DIAGNOSIS — R7309 Other abnormal glucose: Secondary | ICD-10-CM

## 2018-11-21 MED ORDER — ROPINIROLE HCL 1 MG PO TABS: ORAL_TABLET | ORAL | 0 refills | Status: DC

## 2018-11-21 MED ORDER — ALPRAZOLAM 1 MG PO TABS: ORAL_TABLET | ORAL | 0 refills | Status: DC

## 2018-11-21 NOTE — Patient Instructions (Signed)

## 2018-11-21 NOTE — Progress Notes (Signed)
This very nice 67 y.o. MWF presents for 3 month follow up with HTN, HLD, Pre-Diabetes and Vitamin D Deficiency.  In Oct 2019, she underwent Lt TKR Dr Alvan Dame. Patient has ongoing c/o RLSx's.      Patient has been followed expectantly circa 2008 for labile HTN & BP has been controlled and today's BP is at goal - 118/62. Patient has had no complaints of any cardiac type chest pain, palpitations, dyspnea / orthopnea / PND, dizziness, claudication, or dependent edema.     Hyperlipidemia is controlled with diet & meds. Patient denies myalgias or other med SE's. Last Lipids were not at goal: Lab Results  Component Value Date   CHOL 220 (H) 05/09/2018   HDL 89 05/09/2018   LDLCALC 113 (H) 05/09/2018   TRIG 84 05/09/2018   CHOLHDL 2.5 05/09/2018      Also, the patient is monitored expectantly for glucose Intolerance and has had no symptoms of reactive hypoglycemia, diabetic polys, paresthesias or visual blurring.  Last A1c was Normal & at goal: Lab Results  Component Value Date   HGBA1C 5.2 11/07/2017      Patient has been on Thyroid Replacement since the 1980's.     Further, the patient also has history of Vitamin D Deficiency  ("25" / 2008) and she does not supplement as repeatedly recommended.  Last vitamin D was still very low:  Lab Results  Component Value Date   VD25OH 37 05/09/2018   Current Outpatient Medications on File Prior to Visit  Medication Sig  . Cholecalciferol (VITAMIN D3 PO) Take 2,000 Units by mouth daily.   . Cyanocobalamin (B-12 PO) Take 1 tablet by mouth daily.  . cyclobenzaprine (FLEXERIL) 5 MG tablet Take 1 tablet (5 mg total) by mouth 3 (three) times daily as needed for muscle spasms. (Patient taking differently: Take 5 mg by mouth at bedtime. )  . estradiol (ESTRACE) 1 MG tablet Take 1 mg by mouth daily.  Marland Kitchen SYNTHROID 75 MCG tablet Take 75 mcg by mouth daily before breakfast.    No current facility-administered medications on file prior to visit.    Allergies    Allergen Reactions  . Celebrex [Celecoxib]     "panic attack"  . Other     Skin bruises very easily and peels back  . Topamax [Topiramate]     MEMORY  . Entex Lq [Phenylephrine-Guaifenesin] Rash  . Fiorinal [Butalbital-Aspirin-Caffeine] Rash   PMHx:   Past Medical History:  Diagnosis Date  . Anemia   . Anxiety   . Arthritis   . Back pain, chronic   . Cancer ([place])  2 weeks ago    skin cancer  left anterior wrist   and right side of  face     . Depression   . GERD (gastroesophageal reflux disease)   . Hyperlipidemia   . Hypothyroidism   . Kidney stone    2015  . Thyroid disease   . Umbilical hernia   . Unspecified vitamin D deficiency    Immunization History  Administered Date(s) Administered  . Influenza Split 06/29/2015  . Influenza Whole 05/21/2013  . Influenza, High Dose Seasonal PF 05/30/2016, 06/13/2017, 06/18/2018  . PPD Test 09/23/2013, 10/01/2014, 02/26/2016  . Pneumococcal Conjugate-13 05/03/2017  . Pneumococcal Polysaccharide-23 09/23/2013  . Td 09/19/2005  . Tdap 10/07/2015  . Zoster 03/27/2014   Past Surgical History:  Procedure Laterality Date  . ABDOMINAL HYSTERECTOMY    . ANTERIOR LAT LUMBAR FUSION N/A 04/07/2017  Procedure: LUMBAR ONE-TWO ANTERIOR LATERAL LUMBAR FUSION WITH LATERAL PLATE;  Surgeon: Eustace Moore, MD;  Location: [facility];  Service: Neurosurgery;  Laterality: N/A;  Right side approach  . APPENDECTOMY    . CESAREAN SECTION    . HERNIA REPAIR    . LAPAROSCOPIC LYSIS INTESTINAL ADHESIONS    . MAXIMUM ACCESS (MAS)POSTERIOR LUMBAR INTERBODY FUSION (PLIF) 1 LEVEL N/A 04/16/2015   Procedure: masPLIF  - L2-L3 ;  Surgeon: Eustace Moore, MD;  Location: [facility];  Service: Neurosurgery;  Laterality: N/A;  masPLIF  - L2-L3   . SPINE SURGERY     lumbar  . TONSILLECTOMY AND ADENOIDECTOMY    . TOTAL KNEE ARTHROPLASTY Left 07/17/2018   Procedure: LEFT TOTAL KNEE ARTHROPLASTY;  Surgeon: Paralee Cancel, MD;  Location: [facility];  Service:  Orthopedics;  Laterality: Left;  70 mins   FHx:    Reviewed / unchanged  SHx:    Reviewed / unchanged   Systems Review:  Constitutional: Denies fever, chills, wt changes, headaches, insomnia, fatigue, night sweats, change in appetite. Eyes: Denies redness, blurred vision, diplopia, discharge, itchy, watery eyes.  ENT: Denies discharge, congestion, post nasal drip, epistaxis, sore throat, earache, hearing loss, dental pain, tinnitus, vertigo, sinus pain, snoring.  CV: Denies chest pain, palpitations, irregular heartbeat, syncope, dyspnea, diaphoresis, orthopnea, PND, claudication or edema. Respiratory: denies cough, dyspnea, DOE, pleurisy, hoarseness, laryngitis, wheezing.  Gastrointestinal: Denies dysphagia, odynophagia, heartburn, reflux, water brash, abdominal pain or cramps, nausea, vomiting, bloating, diarrhea, constipation, hematemesis, melena, hematochezia  or hemorrhoids. Genitourinary: Denies dysuria, frequency, urgency, nocturia, hesitancy, discharge, hematuria or flank pain. Musculoskeletal: Denies arthralgias, myalgias, stiffness, jt. swelling, pain, limping or strain/sprain.  Skin: Denies pruritus, rash, hives, warts, acne, eczema or change in skin lesion(s). Neuro: No weakness, tremor, incoordination, spasms, paresthesia or pain. Psychiatric: Denies confusion, memory loss or sensory loss. Endo: Denies change in weight, skin or hair change.  Heme/Lymph: No excessive bleeding, bruising or enlarged lymph nodes.  Physical Exam  BP 118/62   Pulse 76   Temp (!) 97.1 F (36.2 C)   Resp 16   Ht 5\' 1"  (1.549 m)   Wt 132 lb 3.2 oz (60 kg)   BMI 24.98 kg/m   Appears  well nourished, well groomed  and in no distress.  Eyes: PERRLA, EOMs, conjunctiva no swelling or erythema. Sinuses: No frontal/maxillary tenderness ENT/Mouth: EAC's clear, TM's nl w/o erythema, bulging. Nares clear w/o erythema, swelling, exudates. Oropharynx clear without erythema or exudates. Oral hygiene is  good. Tongue normal, non obstructing. Hearing intact.  Neck: Supple. Thyroid not palpable. Car 2+/2+ without bruits, nodes or JVD. Chest: Respirations nl with BS clear & equal w/o rales, rhonchi, wheezing or stridor.  Cor: Heart sounds normal w/ regular rate and rhythm without sig. murmurs, gallops, clicks or rubs. Peripheral pulses normal and equal  without edema.  Abdomen: Soft & bowel sounds normal. Non-tender w/o guarding, rebound, hernias, masses or organomegaly.  Lymphatics: Unremarkable.  Musculoskeletal: Full ROM all peripheral extremities, joint stability, 5/5 strength and normal gait.  Skin: Warm, dry without exposed rashes, lesions or ecchymosis apparent.  Neuro: Cranial nerves intact, reflexes equal bilaterally. Sensory-motor testing grossly intact. Tendon reflexes grossly intact.  Pysch: Alert & oriented x 3.  Insight and judgement nl & appropriate. No ideations.  Assessment and Plan:  1. Labile hypertension  - Continue medication, monitor blood pressure at home.  - Continue DASH diet.  Reminder to go to the ER if any CP,  SOB, nausea, dizziness,  severe HA, changes vision/speech.  - CBC with Differential/Platelet - COMPLETE METABOLIC PANEL WITH GFR - Magnesium  2. Hyperlipidemia, mixed  - Continue diet/meds, exercise,& lifestyle modifications.  - Continue monitor periodic cholesterol/liver & renal functions   - Lipid panel - TSH  3. Abnormal glucose  - Continue diet, exercise  - Lifestyle modifications.  - Monitor appropriate labs.  - Hemoglobin A1c - Insulin, random  4. Vitamin D deficiency  - Continue supplementation.  - VITAMIN D 25 Hydroxyl  5. Hypothyroidism  - TSH  6. Prediabetes  - Hemoglobin A1c - Insulin, random  7. Medication management  - CBC with Differential/Platelet - COMPLETE METABOLIC PANEL WITH GFR - Magnesium - Lipid panel - TSH - Hemoglobin A1c - Insulin, random - VITAMIN D 25 Hydroxyl       Discussed  regular exercise,  BP monitoring, weight control to achieve/maintain BMI less than 25 and discussed med and SE's. Recommended labs to assess and monitor clinical status with further disposition pending results of labs. Over 30 minutes of exam, counseling, chart review was performed.

## 2018-11-22 LAB — COMPLETE METABOLIC PANEL WITH GFR
AG Ratio: 1.8 (calc) (ref 1.0–2.5)
ALT: 9 U/L (ref 6–29)
AST: 15 U/L (ref 10–35)
Albumin: 4.5 g/dL (ref 3.6–5.1)
Alkaline phosphatase (APISO): 55 U/L (ref 37–153)
BUN: 14 mg/dL (ref 7–25)
CO2: 27 mmol/L (ref 20–32)
Calcium: 9.6 mg/dL (ref 8.6–10.4)
Chloride: 107 mmol/L (ref 98–110)
Creat: 0.76 mg/dL (ref 0.50–0.99)
GFR, Est African American: 94 mL/min/{1.73_m2} (ref >=60)
GFR, Est Non African American: 81 mL/min/{1.73_m2} (ref >=60)
Globulin: 2.5 g/dL (calc) (ref 1.9–3.7)
Glucose, Bld: 67 mg/dL (ref 65–99)
Potassium: 4 mmol/L (ref 3.5–5.3)
Sodium: 142 mmol/L (ref 135–146)
Total Bilirubin: 0.4 mg/dL (ref 0.2–1.2)
Total Protein: 7 g/dL (ref 6.1–8.1)

## 2018-11-22 LAB — CBC WITH DIFFERENTIAL/PLATELET
Absolute Monocytes: 405 cells/uL (ref 200–950)
Basophils Absolute: 40 cells/uL (ref 0–200)
Basophils Relative: 0.9 %
Eosinophils Absolute: 141 cells/uL (ref 15–500)
Eosinophils Relative: 3.2 %
HCT: 37 % (ref 35.0–45.0)
Hemoglobin: 12.4 g/dL (ref 11.7–15.5)
Lymphs Abs: 2020 cells/uL (ref 850–3900)
MCH: 29 pg (ref 27.0–33.0)
MCHC: 33.5 g/dL (ref 32.0–36.0)
MCV: 86.7 fL (ref 80.0–100.0)
MONOS PCT: 9.2 %
MPV: 8.4 fL (ref 7.5–12.5)
NEUTROS ABS: 1795 {cells}/uL (ref 1500–7800)
Neutrophils Relative %: 40.8 %
Platelets: 367 10*3/uL (ref 140–400)
RBC: 4.27 10*6/uL (ref 3.80–5.10)
RDW: 13.4 % (ref 11.0–15.0)
Total Lymphocyte: 45.9 %
WBC: 4.4 10*3/uL (ref 3.8–10.8)

## 2018-11-22 LAB — LIPID PANEL
Cholesterol: 193 mg/dL (ref <200)
HDL: 86 mg/dL (ref >=50)
LDL Cholesterol (Calc): 90 mg/dL (calc)
Non-HDL Cholesterol (Calc): 107 mg/dL (calc) (ref <130)
TRIGLYCERIDES: 83 mg/dL (ref <150)
Total CHOL/HDL Ratio: 2.2 (calc) (ref <5.0)

## 2018-11-22 LAB — HEMOGLOBIN A1C
Hgb A1c MFr Bld: 5.5 % of total Hgb (ref <5.7)
Mean Plasma Glucose: 111 (calc)
eAG (mmol/L): 6.2 (calc)

## 2018-11-22 LAB — INSULIN, RANDOM: INSULIN: 2.1 u[IU]/mL

## 2018-11-22 LAB — TSH: TSH: 2.09 mIU/L (ref 0.40–4.50)

## 2018-11-22 LAB — MAGNESIUM: Magnesium: 1.9 mg/dL (ref 1.5–2.5)

## 2018-11-22 LAB — VITAMIN D 25 HYDROXY (VIT D DEFICIENCY, FRACTURES): Vit D, 25-Hydroxy: 34 ng/mL (ref 30–100)

## 2018-11-23 ENCOUNTER — Other Ambulatory Visit: Payer: Self-pay | Admitting: Internal Medicine

## 2018-11-23 MED ORDER — PREDNISONE 20 MG PO TABS: ORAL_TABLET | ORAL | 0 refills | Status: DC

## 2018-11-23 MED ORDER — AZITHROMYCIN 250 MG PO TABS: ORAL_TABLET | ORAL | 1 refills | Status: DC

## 2018-12-19 ENCOUNTER — Other Ambulatory Visit: Payer: Self-pay | Admitting: *Deleted

## 2018-12-19 DIAGNOSIS — G2581 Restless legs syndrome: Secondary | ICD-10-CM

## 2018-12-19 MED ORDER — ROPINIROLE HCL 1 MG PO TABS: ORAL_TABLET | ORAL | 1 refills | Status: DC

## 2018-12-26 ENCOUNTER — Other Ambulatory Visit: Payer: Self-pay

## 2018-12-26 ENCOUNTER — Ambulatory Visit: Payer: Medicare Other | Admitting: Adult Health

## 2018-12-26 ENCOUNTER — Encounter: Payer: Self-pay | Admitting: Adult Health

## 2018-12-26 VITALS — Temp 96.7°F | Wt 130.0 lb

## 2018-12-26 DIAGNOSIS — J0111 Acute recurrent frontal sinusitis: Secondary | ICD-10-CM | POA: Diagnosis not present

## 2018-12-26 MED ORDER — AMOXICILLIN-POT CLAVULANATE 875-125 MG PO TABS: 1.0000 | ORAL_TABLET | Freq: Two times a day (BID) | ORAL | 0 refills | Status: AC

## 2018-12-26 MED ORDER — PREDNISONE 20 MG PO TABS: ORAL_TABLET | ORAL | 0 refills | Status: DC

## 2018-12-26 NOTE — Progress Notes (Signed)
Virtual Visit via Telephone Note  I connected with Monique Thomas on 12/26/18 at 12:30 PM EDT by telephone and verified that I am speaking with the correct person using two identifiers.   I discussed the limitations, risks, security and privacy concerns of performing an evaluation and management service by telephone and the availability of in person appointments. I also discussed with the patient that there may be a patient responsible charge related to this service. The patient expressed understanding and agreed to proceed.   History of Present Illness:  Temp (!) 96.7 F (35.9 C)   Wt 130 lb (59 kg)   BMI 24.56 kg/m   68 y.o. female with hx of chronic pain, hypothyroidism, allergic rhinitis reports persistent upper respiratory symptoms; began 1 month ago with runny nose, headaches, progressed with nasal congestion, sinus pressure (has seen resolved) now having pressure in bilateral ears (denies sharp pain, changes in hearing). She reports history of similar annually about this time.   She has been taking aleve which does improved the headache significantly though doesn't fully resolve. Claritin 10 mg daily that has been somewhat helpful, but intermittent. Has been on zyrtec in the past. Has tried flonase without significant improvement. She is not taking sudafed or other decongestant.   She was prescribed zpak and prednisone taper in early March which she reports didn't help. She has been prescribed amoxicillin/augmentin in the past which has been more helpful and she reports she tolerates without SE.   She denies known sick contacts or recent travel. She denies fever/chills, vision changes, dizziness, cough, sore throat, N/V/D, dyspnea, wheezing, fatigue/malaise.     Observations/Objective:  General Appearance:Well sounding, in no apparent distress.  ENT/Mouth: No hoarseness, No cough for duration of visit.  Respiratory: completing full sentences without distress, without audible  wheeze Neuro: Awake and oriented X 3,  Psych:  Insight and Judgment appropriate.    Assessment and Plan:  Monique Thomas was seen today for acute visit.  Diagnoses and all orders for this visit:  Acute recurrent frontal sinusitis Discussed the importance of avoiding unnecessary antibiotic therapy - try changing allergy pill and suggested OTC medications and evaluate response prior to initiating abx Nasal saline spray and sudafed or other decongestant recommended for congestion. rotate allergy pill back to zyrtec, oral steroids offered Follow up as needed if not improving -     predniSONE (DELTASONE) 20 MG tablet; 1 tab 3 x day for 3 days, then 1 tab 2 x day for 3 days, then 1 tab 1 x day for 5 days -     amoxicillin-clavulanate (AUGMENTIN) 875-125 MG tablet; Take 1 tablet by mouth 2 (two) times daily for 10 days.   Follow Up Instructions:    I discussed the assessment and treatment plan with the patient. The patient was provided an opportunity to ask questions and all were answered. The patient agreed with the plan and demonstrated an understanding of the instructions.   The patient was advised to call back or seek an in-person evaluation if the symptoms worsen or if the condition fails to improve as anticipated.  I provided 15 minutes of non-face-to-face time during this encounter.   Izora Ribas, NP

## 2019-02-18 ENCOUNTER — Ambulatory Visit: Payer: Medicare Other | Admitting: Adult Health

## 2019-02-18 ENCOUNTER — Encounter: Payer: Self-pay | Admitting: Adult Health

## 2019-02-18 ENCOUNTER — Other Ambulatory Visit: Payer: Self-pay

## 2019-02-18 VITALS — BP 128/68 | HR 66 | Temp 97.5°F | Wt 134.0 lb

## 2019-02-18 DIAGNOSIS — E538 Deficiency of other specified B group vitamins: Secondary | ICD-10-CM

## 2019-02-18 DIAGNOSIS — G2581 Restless legs syndrome: Secondary | ICD-10-CM | POA: Diagnosis not present

## 2019-02-18 DIAGNOSIS — R5381 Other malaise: Secondary | ICD-10-CM | POA: Diagnosis not present

## 2019-02-18 DIAGNOSIS — R5383 Other fatigue: Secondary | ICD-10-CM

## 2019-02-18 DIAGNOSIS — D649 Anemia, unspecified: Secondary | ICD-10-CM

## 2019-02-18 NOTE — Progress Notes (Signed)
Assessment and Plan:  Monique Thomas was seen today for leg problem.  Diagnoses and all orders for this visit:  Restless legs Check labs, increase activity, calf stretches dicussed and demonstrated Try gabapentin 1/2 tab (150 mg) due to excess sedation Some concern for possible concurrent poor circulation/claudication (bil calf aching with exertion), decreased pulses, cool extremities - discussed referral for ABI which she declines at this time due to covid 19; if no improvement in a few months will follow up - she will start bASA daily  -     CBC with Differential/Platelet -     Iron,Total/Total Iron Binding Cap -     BASIC METABOLIC PANEL WITH GFR -     Magnesium -     Vitamin B12  Further disposition pending results of labs. Discussed med's effects and SE's.   Over 30 minutes of exam, counseling, chart review, and critical decision making was performed.   Future Appointments  Date Time Provider [place]  02/27/2019 10:00 AM Liane Comber, NP [facility] None  05/31/2019 10:30 AM Vicie Mutters, PA-C [facility] None    ------------------------------------------------------------------------------------------------------------------   HPI BP 128/68   Pulse 66   Temp (!) 97.5 F (36.4 C)   Wt 134 lb (60.8 kg)   SpO2 98%   BMI 25.32 kg/m   68 y.o.female with hx of restless legs, previously well controlled off of medications, then had left knee surgery on 07/17/2018 by Dr. Alvan Dame and has had persistent "jumpy" and "spasms" of bilateral extremities persisting since then. She cannot move in bed at all without severe cramping and spasms. Mild weak sensation during the day without spasms. She also reports strong ache in bilateral calves after walking approx 0.25 mile distance ongoing for several months.   She has been prescribed gabapentin 300 mg tabs by neurology, helpful but feels excessively sedated next AM.   She is on an iron supplement 65 mg every other day, has been taking  for a month or so without improvement in sx. She was on Requip 1-3 mg and had significant grogginess and stopped. She dislikes taking medication for this condition. She also has some persistent back pain and has been getting injections via ortho; reports back pain has also significantly limited exercise and stretching, did see PT but without good resolution of this problem.    No smoking hx, Cholesterol has been well controlled. She is not on ASA.   She has been on iron supplement 65 mg ever other day x 1 month due to fatigue/malaise. She has been taking aleve back/body 1-2 tabs at bedtime. She admits exercise is very limited.   Lab Results  Component Value Date   WBC 4.4 11/21/2018   HGB 12.4 11/21/2018   HCT 37.0 11/21/2018   MCV 86.7 11/21/2018   PLT 367 11/21/2018   Lab Results  Component Value Date   IRON 54 05/09/2018   TIBC 322 05/09/2018   FERRITIN 23 11/07/2017   Has hx of B12 def and has been on supplement:  Lab Results  Component Value Date   VITAMINB12 >2,000 (H) 05/03/2017       Past Medical History:  Diagnosis Date  . Anemia   . Anxiety   . Arthritis   . Back pain, chronic   . Cancer ([place])  2 weeks ago    skin cancer  left anterior wrist   and right side of  face     . Depression   . GERD (gastroesophageal reflux disease)   . Hyperlipidemia   .  Hypothyroidism   . Kidney stone    2015  . Thyroid disease   . Umbilical hernia   . Unspecified vitamin D deficiency      Allergies  Allergen Reactions  . Celebrex [Celecoxib]     "panic attack"  . Other     Skin bruises very easily and peels back  . Topamax [Topiramate]     MEMORY  . Entex Lq [Phenylephrine-Guaifenesin] Rash  . Fiorinal [Butalbital-Aspirin-Caffeine] Rash    Current Outpatient Medications on File Prior to Visit  Medication Sig  . ALPRAZolam (XANAX) 1 MG tablet Take 1/2 to 1 tablet  At Bedtime & please try to limit to 5 days /week to avoid addiction  . Cholecalciferol (VITAMIN D3 PO)  Take 5,000 Units by mouth 2 (two) times a day.   . Cyanocobalamin (B-12 PO) Take 1,000 mcg by mouth daily.   Marland Kitchen estradiol (ESTRACE) 1 MG tablet Take 1 mg by mouth daily.  . Ferrous Sulfate (IRON) 325 (65 Fe) MG TABS Take 65 mg by mouth every other day.  . Gabapentin, Once-Daily, 300 MG TABS Take 1 tablet by mouth 2 (two) times daily as needed.  . Magnesium 250 MG TABS Take 500 mg by mouth 2 (two) times a day.   Marland Kitchen SYNTHROID 75 MCG tablet Take 75 mcg by mouth daily before breakfast.    No current facility-administered medications on file prior to visit.     ROS: all negative except above.   Physical Exam:  BP 128/68   Pulse 66   Temp (!) 97.5 F (36.4 C)   Wt 134 lb (60.8 kg)   SpO2 98%   BMI 25.32 kg/m   General Appearance: Well nourished, in no apparent distress. Eyes: conjunctiva no swelling or erythema ENT/Mouth: Hearing normal.  Neck: Supple, thyroid normal.  Respiratory: Respiratory effort normal, BS equal bilaterally without rales, rhonchi, wheezing or stridor.  Cardio: RRR with no MRGs. Unable to locate tibilal pulses, pedal pulses + bilaterally, cool feet  Abdomen: Soft, + BS.  Non tender, no guarding, rebound, hernias, masses. Lymphatics: Non tender without lymphadenopathy.  Musculoskeletal: Full ROM, 5/5 strength, normal steady gait.  Skin: Warm, dry without rashes, lesions, ecchymosis.  Neuro: Normal muscle tone, no cerebellar symptoms. Sensation intact.  Psych: Awake and oriented X 3, normal affect, Insight and Judgment appropriate.     Izora Ribas, NP 1:55 PM [facility]

## 2019-02-18 NOTE — Patient Instructions (Addendum)
Get on daily baby aspirin (81 mg)    You can try some of the at home treatments for Restless legs and we will check some labs on you looking for deficiencies that could contribute to it however we will have you start on :  Try gabapentin 1/2 tab (150 mg) - we can prescribe you lower (100 mg) if needed  Gabapentin you start out at 100-300mg  and an go to 600mg . You generally want to take this 2-3 hours before the symptoms occur.  Common adverse effects include somnolence and dizziness; these are generally mild to moderate and remit with time.  This medication is generic.   Restless Legs Syndrome Restless legs syndrome is a movement disorder. It may also be called a sensorimotor disorder.  CAUSES  No one knows what specifically causes restless legs syndrome, but it tends to run in families. It is also more common in people with low iron, in pregnancy, in people who need dialysis, and those with nerve damage (neuropathy).Some medications may make restless legs syndrome worse.Those medications include drugs to treat high blood pressure, some heart conditions, nausea, colds, allergies, and depression. SYMPTOMS Symptoms include uncomfortable sensations in the legs. These leg sensations are worse during periods of inactivity or rest. They are also worse while sitting or lying down. Individuals that have the disorder describe sensations in the legs that feel like:  Pulling.  Drawing.  Crawling.  Worming.  Boring.  Tingling.  Pins and needles.  Prickling.  Pain. The sensations are usually accompanied by an overwhelming urge to move the legs. Sudden muscle jerks may also occur. Movement provides temporary relief from the discomfort. In rare cases, the arms may also be affected. Symptoms may interfere with going to sleep (sleep onset insomnia). Restless legs syndrome may also be related to periodic limb movement disorder (PLMD). PLMD is another more common motor disorder. It also causes  interrupted sleep. The symptoms from PLMD usually occur most often when you are awake. TREATMENT  Treatment for restless legs syndrome is symptomatic. This means that the symptoms are treated.   Massage and cold compresses may provide temporary relief.  Walk, stretch, or take a cold or hot bath.  Get regular exercise and a good night's sleep.  Avoid caffeine, alcohol, nicotine, and medications that can make it worse.  Do activities that provide mental stimulation like discussions, needlework, and video games. These may be helpful if you are not able to walk or stretch. Some medications are effective in relieving the symptoms. However, many of these medications have side effects. Ask your caregiver about medications that may help your symptoms. Correcting iron deficiency may improve symptoms for some patients. Document Released: 08/26/2002 Document Revised: 01/20/2014 Document Reviewed: 12/02/2010 [facility] Patient Information 2015 [place], [place]. This information is not intended to replace advice given to you by your health care provider. Make sure you discuss any questions you have with your health care provider.     Intermittent Claudication Intermittent claudication is pain in one or both legs that occurs when walking or exercising and goes away when resting. Intermittent claudication is a symptom of peripheral arterial disease (PAD). This condition is commonly treated with rest, medicine, and healthy lifestyle changes. If medical management does not improve symptoms, surgery can be done to restore blood flow (revascularization) to the affected leg. What are the causes?  This condition is caused by buildup of fatty material (plaque) within the major arteries in the body (atherosclerosis). Plaque makes arteries stiff and narrow, which prevents  enough blood from reaching the leg muscles. Pain occurs when you walk or exercise because your muscles need (but cannot get) more blood when you are  moving and exercising. What increases the risk? The following factors may make you more likely to develop this condition:  A family history of atherosclerosis.  A personal history of stroke or heart disease.  Older age.  Being inactive (sedentary lifestyle).  Being overweight.  Smoking cigarettes.  Having another health condition such as: ? Diabetes. ? High blood pressure. ? High cholesterol. What are the signs or symptoms? Symptoms of this condition may first develop in the lower leg, and then they may spread to the thigh, hip, buttock, or the back of the lower leg (calf) over time. Symptoms may include:  Aches or pains.  Cramps.  A feeling of tightness, weakness, or heaviness.  A wound on the lower leg or foot that heals poorly or does not heal. How is this diagnosed? This condition may be diagnosed based on:  Your symptoms.  Your medical history.  Tests, such as: ? Blood tests. ? Arterial duplex ultrasound. This test uses images of blood vessels and surrounding organs to evaluate blood flow within arteries. ? Angiogram. In this procedure, dye is injected into arteries and then X-rays are taken. ? Magnetic resonance angiogram (MRA). In this procedure, strong magnets and radio waves are used instead of X-rays to create images of blood vessels and blood flow. ? CT angiogram (CTA). In this procedure, a large X-ray machine called a CT scanner takes detailed pictures of blood vessels that have been injected with dye. ? Ankle-brachial index (ABI) test. This procedure measures blood pressure in the leg during exercise and at rest. ? Exercise test. For this test, you will walk on a treadmill while tests are done (such as the ABI test) to evaluate how this condition affects your ability to walk or exercise. How is this treated? Treatment for this condition may involve treatment for the underlying cause, such as treatment for high blood pressure, high cholesterol, or diabetes.  Treatment may include:  Lifestyle changes such as: ? Starting a supervised or home-based exercise program. ? Losing weight. ? Quitting smoking.  Medicines to help restore blood flow through your legs.  Blood vessel surgery (angioplasty) to restore blood flow around the blocked vessel. This is also known as endovascular therapy (EVT). This is only done if your intermittent claudication is caused by severe peripheral artery disease, a condition in which blood flow is severely or totally restricted by the narrowing of the arteries. Follow these instructions at home: Lifestyle   Maintain a healthy weight.  Eat a diet that is low in saturated fats and calories. Consider working with a diet and nutrition specialist (dietitian) to help you make healthy food choices.  Do not use any products that contain nicotine or tobacco, such as cigarettes and e-cigarettes. If you need help quitting, ask your health care provider.  If your health care provider recommended an exercise program for you, follow it as directed. Your exercise program may involve: ? Walking 3 or more times a week. ? Walking until you have certain symptoms of intermittent claudication. ? Resting until symptoms go away. ? Gradually increasing your walking time to about 50 minutes a day. General instructions  Work with your health care provider to manage any other health conditions you may have, including diabetes, high blood pressure, or high cholesterol.  Take over-the-counter and prescription medicines only as told by your health care  provider.  Keep all follow-up visits as told by your health care provider. This is important. Contact a health care provider if:  Your pain does not go away with rest.  You have sores on your legs that do not heal or have a bad smell or pus coming from them.  Your condition gets worse or does not get better with treatment. Get help right away if:  You have chest pain.  You have  difficulty breathing.  You develop arm weakness.  You have trouble speaking.  Your face begins to droop.  Your foot or leg is cold or it changes color.  Your foot or leg becomes numb. These symptoms may represent a serious problem that is an emergency. Do not wait to see if the symptoms will go away. Get medical help right away. Call your local emergency services (911 in the [place] Do not drive yourself to the hospital.  Summary  Intermittent claudication is pain in one or both legs that occurs when walking or exercising and goes away when resting.  This condition is caused by buildup of fatty material (plaque) within the major arteries in the body (atherosclerosis). Plaque makes arteries stiff and narrow, which prevents enough blood from reaching the leg muscles.  Intermittent claudication can be treated with medicine and lifestyle changes. If medical treatment fails, surgery can be done to help return blood flow to the affected area.  Make sure you work with your health care provider to manage any other health conditions you may have, including diabetes, high blood pressure, or high cholesterol. This information is not intended to replace advice given to you by your health care provider. Make sure you discuss any questions you have with your health care provider. Document Released: 07/08/2004 Document Revised: 10/06/2016 Document Reviewed: 10/06/2016 Elsevier Interactive Patient Education  2019 [employer].

## 2019-02-19 ENCOUNTER — Ambulatory Visit: Payer: Self-pay | Admitting: Adult Health

## 2019-02-19 LAB — CBC WITH DIFFERENTIAL/PLATELET
Absolute Monocytes: 410 cells/uL (ref 200–950)
Basophils Absolute: 41 cells/uL (ref 0–200)
Basophils Relative: 0.9 %
Eosinophils Absolute: 90 cells/uL (ref 15–500)
Eosinophils Relative: 2 %
HCT: 37.2 % (ref 35.0–45.0)
Hemoglobin: 12.5 g/dL (ref 11.7–15.5)
Lymphs Abs: 1454 cells/uL (ref 850–3900)
MCH: 29.9 pg (ref 27.0–33.0)
MCHC: 33.6 g/dL (ref 32.0–36.0)
MCV: 89 fL (ref 80.0–100.0)
MPV: 8.9 fL (ref 7.5–12.5)
Monocytes Relative: 9.1 %
Neutro Abs: 2507 cells/uL (ref 1500–7800)
Neutrophils Relative %: 55.7 %
Platelets: 358 10*3/uL (ref 140–400)
RBC: 4.18 10*6/uL (ref 3.80–5.10)
RDW: 13.1 % (ref 11.0–15.0)
Total Lymphocyte: 32.3 %
WBC: 4.5 10*3/uL (ref 3.8–10.8)

## 2019-02-19 LAB — BASIC METABOLIC PANEL WITH GFR
BUN: 17 mg/dL (ref 7–25)
CO2: 28 mmol/L (ref 20–32)
Calcium: 9.2 mg/dL (ref 8.6–10.4)
Chloride: 108 mmol/L (ref 98–110)
Creat: 0.69 mg/dL (ref 0.50–0.99)
GFR, Est African American: 104 mL/min/{1.73_m2} (ref >=60)
GFR, Est Non African American: 89 mL/min/{1.73_m2} (ref >=60)
Glucose, Bld: 95 mg/dL (ref 65–99)
Potassium: 4.3 mmol/L (ref 3.5–5.3)
Sodium: 142 mmol/L (ref 135–146)

## 2019-02-19 LAB — MAGNESIUM: Magnesium: 1.9 mg/dL (ref 1.5–2.5)

## 2019-02-19 LAB — IRON, TOTAL/TOTAL IRON BINDING CAP
%SAT: 17 % (calc) (ref 16–45)
Iron: 63 ug/dL (ref 45–160)
TIBC: 379 mcg/dL (calc) (ref 250–450)

## 2019-02-19 LAB — VITAMIN B12: Vitamin B-12: >2000 pg/mL — ABNORMAL HIGH (ref 200–1100)

## 2019-02-20 ENCOUNTER — Ambulatory Visit: Payer: Medicare Other | Admitting: Adult Health

## 2019-02-26 DIAGNOSIS — M8580 Other specified disorders of bone density and structure, unspecified site: Secondary | ICD-10-CM | POA: Insufficient documentation

## 2019-02-26 NOTE — Progress Notes (Signed)
Virtual Visit via Telephone Note  I connected with Monique Thomas on 02/27/19 at 10:00 AM EDT by telephone and verified that I am speaking with the correct person using two identifiers.  Location: Patient: home Provider: [place] office   I discussed the limitations, risks, security and privacy concerns of performing an evaluation and management service by telephone and the availability of in person appointments. I also discussed with the patient that there may be a patient responsible charge related to this service. The patient expressed understanding and agreed to proceed.    I discussed the assessment and treatment plan with the patient. The patient was provided an opportunity to ask questions and all were answered. The patient agreed with the plan and demonstrated an understanding of the instructions.   The patient was advised to call back or seek an in-person evaluation if the symptoms worsen or if the condition fails to improve as anticipated.  I provided 22 minutes of non-face-to-face time during this encounter.   Izora Ribas, NP     MEDICARE ANNUAL WELLNESS VISIT AND FOLLOW UP  Assessment:   Diagnoses and all orders for this visit:   Encounter for Medicare annual wellness exam  Gastroesophageal reflux disease with esophagitis Well managed on current medications Discussed diet, avoiding triggers and other lifestyle changes  Hypothyroidism, unspecified type continue medications the same pending lab results reminded to take on an empty stomach 30-42mins before food.  check TSH level  Vitamin D deficiency Continue supplementation Check vitamin D level  S/P lumbar spinal fusion Continue follow up   Other abnormal glucose Recent A1Cs at goal Discussed diet/exercise, weight management  Defer A1C to CPE; check CMP at routine OV  Medication management Defer, just had checked  Mixed hyperlipidemia At goal by lifestyle modification Continue low cholesterol diet  and exercise.  Defer lipid panel.   Chronic pain syndrome Continue follow up  Chronic back pain, unspecified back location, unspecified back pain laterality Continue follow up  Anxiety Well managed by current regimen; reminded to avoid daily use of benzo to limit tolerance/addiction Stress management techniques discussed, increase water, good sleep hygiene discussed, increase exercise, and increase veggies.   Osteopenia - get dexa 2021, patient pref to defer this year, continue Vit D and Ca, reviewed weight bearing exercises  Restless legs Excess sedation with low dose gabapentin, requip, declines meds at this time; taking xanax at night to assist with sleep, discussed possibly switching to valium or klonopin, declines at this time Continue with lifestyle, walking, stretching - labs normal at recent check    Over 40 minutes of exam, counseling, chart review and critical decision making was performed Future Appointments  Date Time Provider [place] [facility]  05/31/2019 10:30 AM Vicie Mutters, PA-C [facility] None     Plan:   During the course of the visit the patient was educated and counseled about appropriate screening and preventive services including:    Pneumococcal vaccine   Prevnar 13  Influenza vaccine  Td vaccine  Screening electrocardiogram  Bone densitometry screening  Colorectal cancer screening  Diabetes screening  Glaucoma screening  Nutrition counseling   Advanced directives: requested   Subjective:  Monique Thomas is a 68 y.o. female who presents for Medicare Annual Wellness Visit and 3 month follow up.  She is followed by [facility]. She is s/p hysterectomy, on estrace 1 mg via GYN.   She has ongoing concerns with RLS which are worse following her last knee surgery;  Labs for this  were recently checked; normal CBC, CMP/magnesium, iron, B12. She is taking gabapentin 150 mg for this but still reported excess sedation and preferred to  stop, increased activity with some improvement. She declines further interventions at this time.   She has chronic lower back pain, s/p fusion, getting injections via neurosurgery. Doing PT with ortho for knee, s/p TKA.   she has a diagnosis of anxiety and is currently on xanax 1 mg, reports symptoms are well controlled on current regimen. she currently takes 1 tab at night to help with sleep/restless legs.   BMI is There is no height or weight on file to calculate BMI., she has been working on diet and exercise. Wt Readings from Last 3 Encounters:  02/18/19 134 lb (60.8 kg)  12/26/18 130 lb (59 kg)  11/21/18 132 lb 3.2 oz (60 kg)   Today their BP is   She does workout. She denies chest pain, shortness of breath, dizziness.   She is not on cholesterol medication and denies myalgias. Her cholesterol is at goal. The cholesterol last visit was:   Lab Results  Component Value Date   CHOL 193 11/21/2018   HDL 86 11/21/2018   LDLCALC 90 11/21/2018   TRIG 83 11/21/2018   CHOLHDL 2.2 11/21/2018    She has been working on diet and exercise for glucose management, and denies foot ulcerations, increased appetite, nausea, paresthesia of the feet, polydipsia, polyuria, visual disturbances, vomiting and weight loss. Last A1C in the office was:  Lab Results  Component Value Date   HGBA1C 5.5 11/21/2018   She is on thyroid medication. Her medication was not changed last visit.   Lab Results  Component Value Date   TSH 2.09 11/21/2018   Last GFR: Lab Results  Component Value Date   GFRNONAA 89 02/18/2019   Patient is on Vitamin D supplement  Lab Results  Component Value Date   VD25OH 34 11/21/2018        Medication Review: Current Outpatient Medications on File Prior to Visit  Medication Sig Dispense Refill  . ALPRAZolam (XANAX) 1 MG tablet Take 1/2 to 1 tablet  At Bedtime & please try to limit to 5 days /week to avoid addiction 90 tablet 0  . Cholecalciferol (VITAMIN D3 PO) Take  5,000 Units by mouth 2 (two) times a day.     . Cyanocobalamin (B-12 PO) Take 1,000 mcg by mouth daily.     Marland Kitchen estradiol (ESTRACE) 1 MG tablet Take 1 mg by mouth daily.    . Ferrous Sulfate (IRON) 325 (65 Fe) MG TABS Take 65 mg by mouth every other day.    . Magnesium 250 MG TABS Take 500 mg by mouth 2 (two) times a day.     Marland Kitchen SYNTHROID 75 MCG tablet Take 75 mcg by mouth daily before breakfast.   2   No current facility-administered medications on file prior to visit.     Allergies  Allergen Reactions  . Celebrex [Celecoxib]     "panic attack"  . Other     Skin bruises very easily and peels back  . Topamax [Topiramate]     MEMORY  . Entex Lq [Phenylephrine-Guaifenesin] Rash  . Fiorinal [Butalbital-Aspirin-Caffeine] Rash    Current Problems (verified) Patient Active Problem List   Diagnosis Date Noted  . Osteopenia 02/26/2019  . S/P total knee replacement 07/17/2018  . Menopausal symptoms 05/07/2018  . Restless legs 02/06/2018  . S/P lumbar spinal fusion 04/16/2015  . Chronic pain syndrome 03/27/2015  .  Medication management 01/15/2014  . Other abnormal glucose 01/15/2014  . Back pain, chronic   . Hyperlipidemia   . Hypothyroidism   . GERD (gastroesophageal reflux disease)   . Anxiety   . Vitamin D deficiency     Screening Tests Immunization History  Administered Date(s) Administered  . Influenza Split 06/29/2015  . Influenza Whole 05/21/2013  . Influenza, High Dose Seasonal PF 05/30/2016, 06/13/2017, 06/18/2018  . PPD Test 09/23/2013, 10/01/2014, 02/26/2016  . Pneumococcal Conjugate-13 05/03/2017  . Pneumococcal Polysaccharide-23 09/23/2013  . Td 09/19/2005  . Tdap 10/07/2015  . Zoster 03/27/2014   Preventative care: Last colonoscopy: 2008  Cologuard: 04/2017 neg Last mammogram: 08/2018 [facility] Last pap smear/pelvic exam: 09/2017 GYN normal, [place]  DEXA: 01/2017 L fem -1.2 postpone to next year  Prior vaccinations: TD or Tdap: 2017  Influenza:  2019 Pneumococcal: 2015 Prevnar13: 2018 Shingles/Zostavax: 2015  Names of Other Physician/Practitioners you currently use: 1. [facility] here for primary care 2. Dr. Theodis Sato. [place], eye doctor, last visit 2019 3. Dr. Toy Cookey, dentist, q 6 months, last visit 2019, q63m  Patient Care Team: [name], MD as PCP - General (Internal Medicine) Darlis Loan, MD as Referring Physician (Gastroenterology) Vania Rea, MD as Consulting Physician (Obstetrics and Gynecology) Carolan Clines, MD (Inactive) as Consulting Physician (Urology) Zara Council as Physician Assistant (Orthopedic Surgery) Lavonna Monarch, MD as Consulting Physician (Dermatology) Gatha Mayer, MD as Consulting Physician (Gastroenterology) Webb Laws, [place] as Referring Physician (Optometry)  SURGICAL HISTORY She  has a past surgical history that includes Abdominal hysterectomy; Cesarean section; Hernia repair; Appendectomy; Tonsillectomy and adenoidectomy; Spine surgery; Laparoscopic lysis intestinal adhesions; Maximum access (mas)posterior lumbar interbody fusion (plif) 1 level (N/A, 04/16/2015); Anterior lat lumbar fusion (N/A, 04/07/2017); and Total knee arthroplasty (Left, 07/17/2018). FAMILY HISTORY Her family history includes Asthma in her sister; Cancer in her maternal grandmother; Heart disease in her mother; Hyperlipidemia in her mother; Thyroid disease in her mother. SOCIAL HISTORY She  reports that she has never smoked. She has never used smokeless tobacco. She reports that she does not drink alcohol or use drugs.   MEDICARE WELLNESS OBJECTIVES: Physical activity: Current Exercise Habits: Home exercise routine, Type of exercise: walking, Time (Minutes): 30, Frequency (Times/Week): 7, Weekly Exercise (Minutes/Week): 210, Intensity: Mild, Exercise limited by: orthopedic condition(s) Cardiac risk factors: Cardiac Risk Factors include: advanced age  (>32men, >14 women);hypertension;sedentary lifestyle Depression/mood screen:   Depression screen [facility] 2/9 02/27/2019  Decreased Interest 0  Down, Depressed, Hopeless 0  PHQ - 2 Score 0    ADLs:  In your present state of health, do you have any difficulty performing the following activities: 02/27/2019 07/17/2018  Hearing? N N  Vision? N N  Difficulty concentrating or making decisions? N N  Walking or climbing stairs? N Y  Dressing or bathing? N N  Doing errands, shopping? N N  Some recent data might be hidden     Cognitive Testing  Alert? Yes  Normal Appearance?Yes  Oriented to person? Yes  Place? Yes   Time? Yes  Recall of three objects?  Yes  Can perform simple calculations? Yes  Displays appropriate judgment?Yes  Can read the correct time from a watch face?Yes  EOL planning: Does Patient Have a Medical Advance Directive?: No Would patient like information on creating a medical advance directive?: No - Patient declined  Review of Systems  Constitutional: Negative for malaise/fatigue and weight loss.  HENT: Negative for hearing loss and tinnitus.   Eyes: Negative  for blurred vision and double vision.  Respiratory: Negative for cough, sputum production, shortness of breath and wheezing.   Cardiovascular: Negative for chest pain, palpitations, orthopnea, claudication, leg swelling and PND.  Gastrointestinal: Negative for abdominal pain, blood in stool, constipation, diarrhea, heartburn, melena, nausea and vomiting.  Genitourinary: Negative.   Musculoskeletal: Negative for falls, joint pain and myalgias.  Skin: Negative for rash.  Neurological: Negative for dizziness, tingling, sensory change, weakness and headaches.  Endo/Heme/Allergies: Negative for polydipsia.  Psychiatric/Behavioral: Negative for depression, memory loss, substance abuse and suicidal ideas. The patient has insomnia. The patient is not nervous/anxious.   All other systems reviewed and are  negative.    Objective:     Today's Vitals   02/27/19 0955  PainSc: 8   PainLoc: Back   There is no height or weight on file to calculate BMI.  General : Well sounding patient in no apparent distress HEENT: no hoarseness, no cough for duration of visit Lungs: speaks in complete sentences, no audible wheezing, no apparent distress Neurological: alert, oriented x 3 Psychiatric: pleasant, judgement appropriate   Medicare Attestation I have personally reviewed: The patient's medical and social history Their use of alcohol, tobacco or illicit drugs Their current medications and supplements The patient's functional ability including ADLs,fall risks, home safety risks, cognitive, and hearing and visual impairment Diet and physical activities Evidence for depression or mood disorders  The patient's weight, height, BMI, and visual acuity have been recorded in the chart.  I have made referrals, counseling, and provided education to the patient based on review of the above and I have provided the patient with a written personalized care plan for preventive services.     Izora Ribas, NP   02/27/2019

## 2019-02-27 ENCOUNTER — Ambulatory Visit: Payer: Medicare Other | Admitting: Adult Health

## 2019-02-27 ENCOUNTER — Encounter: Payer: Self-pay | Admitting: Adult Health

## 2019-02-27 ENCOUNTER — Other Ambulatory Visit: Payer: Self-pay

## 2019-02-27 DIAGNOSIS — R6889 Other general symptoms and signs: Secondary | ICD-10-CM

## 2019-02-27 DIAGNOSIS — F419 Anxiety disorder, unspecified: Secondary | ICD-10-CM

## 2019-02-27 DIAGNOSIS — G894 Chronic pain syndrome: Secondary | ICD-10-CM

## 2019-02-27 DIAGNOSIS — Z981 Arthrodesis status: Secondary | ICD-10-CM

## 2019-02-27 DIAGNOSIS — M85852 Other specified disorders of bone density and structure, left thigh: Secondary | ICD-10-CM

## 2019-02-27 DIAGNOSIS — E782 Mixed hyperlipidemia: Secondary | ICD-10-CM

## 2019-02-27 DIAGNOSIS — E039 Hypothyroidism, unspecified: Secondary | ICD-10-CM | POA: Diagnosis not present

## 2019-02-27 DIAGNOSIS — G2581 Restless legs syndrome: Secondary | ICD-10-CM | POA: Diagnosis not present

## 2019-02-27 DIAGNOSIS — Z6824 Body mass index (BMI) 24.0-24.9, adult: Secondary | ICD-10-CM

## 2019-02-27 DIAGNOSIS — Z0000 Encounter for general adult medical examination without abnormal findings: Secondary | ICD-10-CM

## 2019-02-27 DIAGNOSIS — Z0001 Encounter for general adult medical examination with abnormal findings: Secondary | ICD-10-CM

## 2019-02-27 DIAGNOSIS — R7309 Other abnormal glucose: Secondary | ICD-10-CM

## 2019-02-27 DIAGNOSIS — Z79899 Other long term (current) drug therapy: Secondary | ICD-10-CM

## 2019-02-27 DIAGNOSIS — E559 Vitamin D deficiency, unspecified: Secondary | ICD-10-CM

## 2019-02-27 DIAGNOSIS — N951 Menopausal and female climacteric states: Secondary | ICD-10-CM

## 2019-02-27 DIAGNOSIS — K2100 Gastro-esophageal reflux disease with esophagitis, without bleeding: Secondary | ICD-10-CM

## 2019-03-11 ENCOUNTER — Other Ambulatory Visit: Payer: Self-pay

## 2019-03-11 ENCOUNTER — Ambulatory Visit: Payer: Medicare Other | Admitting: Physician Assistant

## 2019-03-11 ENCOUNTER — Encounter: Payer: Self-pay | Admitting: Physician Assistant

## 2019-03-11 VITALS — BP 122/68 | HR 79 | Temp 97.3°F | Wt 133.2 lb

## 2019-03-11 DIAGNOSIS — N3000 Acute cystitis without hematuria: Secondary | ICD-10-CM | POA: Diagnosis not present

## 2019-03-11 DIAGNOSIS — E559 Vitamin D deficiency, unspecified: Secondary | ICD-10-CM | POA: Diagnosis not present

## 2019-03-11 DIAGNOSIS — D649 Anemia, unspecified: Secondary | ICD-10-CM | POA: Diagnosis not present

## 2019-03-11 MED ORDER — NITROFURANTOIN MONOHYD MACRO 100 MG PO CAPS: 100.0000 mg | ORAL_CAPSULE | Freq: Two times a day (BID) | ORAL | 0 refills | Status: DC

## 2019-03-11 NOTE — Progress Notes (Signed)
Subjective:    Patient ID: Monique Thomas, female    DOB: 1950-11-03, 68 y.o.   MRN: [id number]  HPI 68 y.o. WF presents with UTI symptoms x Saturday. Has taken azo. She has history of UTI. She has pelvic pressure, lower back Thomas, urgency, small amounts, some dysuria.  She denies chills, fever, flank Thomas, hematuria,  and urinary incontinence.  She is still complaining of bilateral leg Thomas, right worse than left, mainly at night, still happening. Mercie Eon for this previously. No Thomas with walking. Declined ABI. Has history of lower back Thomas.  S/p lumbar fusion 2018. Normal B12, low normal Magnesium now on it, iron low normal, ferritin not checked.   Blood pressure 122/68, pulse 79, temperature (!) 97.3 F (36.3 C), weight 133 lb 3.2 oz (60.4 kg), SpO2 98 %.  Medications Current Outpatient Medications on File Prior to Visit  Medication Sig  . ALPRAZolam (XANAX) 1 MG tablet Take 1/2 to 1 tablet  At Bedtime & please try to limit to 5 days /week to avoid addiction  . Cholecalciferol (VITAMIN D3 PO) Take 5,000 Units by mouth 2 (two) times a day.   . Cyanocobalamin (B-12 PO) Take 1,000 mcg by mouth daily.   Marland Kitchen estradiol (ESTRACE) 1 MG tablet Take 1 mg by mouth daily.  . Ferrous Sulfate (IRON) 325 (65 Fe) MG TABS Take 65 mg by mouth every other day.  . Magnesium 250 MG TABS Take 500 mg by mouth 2 (two) times a day.   Marland Kitchen SYNTHROID 75 MCG tablet Take 75 mcg by mouth daily before breakfast.    No current facility-administered medications on file prior to visit.     Problem list She has Back Thomas, chronic; Hyperlipidemia; Hypothyroidism; GERD (gastroesophageal reflux disease); Anxiety; Vitamin D deficiency; Medication management; Other abnormal glucose; Chronic Thomas syndrome; S/P lumbar spinal fusion; Restless legs; Menopausal symptoms; S/P total knee replacement; and Osteopenia on their problem list.  Allergies Allergies  Allergen Reactions  . Celebrex [Celecoxib]     "panic attack"  .  Other     Skin bruises very easily and peels back  . Topamax [Topiramate]     MEMORY  . Entex Lq [Phenylephrine-Guaifenesin] Rash  . Fiorinal [Butalbital-Aspirin-Caffeine] Rash    SURGICAL HISTORY She  has a past surgical history that includes Abdominal hysterectomy; Cesarean section; Hernia repair; Appendectomy; Tonsillectomy and adenoidectomy; Spine surgery; Laparoscopic lysis intestinal adhesions; Maximum access (mas)posterior lumbar interbody fusion (plif) 1 level (N/A, 04/16/2015); Anterior lat lumbar fusion (N/A, 04/07/2017); and Total knee arthroplasty (Left, 07/17/2018). FAMILY HISTORY Her family history includes Asthma in her sister; Cancer in her maternal grandmother; Heart disease in her mother; Hyperlipidemia in her mother; Thyroid disease in her mother. SOCIAL HISTORY She  reports that she has never smoked. She has never used smokeless tobacco. She reports that she does not drink alcohol or use drugs.   Review of Systems  Constitutional: Negative.  Negative for chills.  HENT: Negative.   Respiratory: Negative.   Cardiovascular: Negative.   Gastrointestinal: Negative.  Negative for nausea and vomiting.  Genitourinary: Negative.  Negative for decreased urine volume, difficulty urinating, dyspareunia, enuresis, flank Thomas, genital sores, hematuria, menstrual problem, pelvic Thomas, vaginal bleeding and vaginal discharge.  Musculoskeletal: Positive for back Thomas and myalgias (leg Thomas at night).  Skin: Negative.  Negative for rash.  Neurological: Negative.   Hematological: Negative.   Psychiatric/Behavioral: Negative.        Objective:   Physical Exam Constitutional:  Appearance: She is well-developed.  Neck:     Musculoskeletal: Normal range of motion and neck supple.  Cardiovascular:     Rate and Rhythm: Normal rate and regular rhythm.     Pulses: Normal pulses.  Pulmonary:     Effort: Pulmonary effort is normal.     Breath sounds: Normal breath sounds.   Abdominal:     General: Bowel sounds are normal. There is no distension.     Palpations: Abdomen is soft. There is no mass.     Tenderness: There is abdominal tenderness (suprapubic). There is no guarding or rebound.  Musculoskeletal: Normal range of motion.        General: No tenderness.  Skin:    General: Skin is warm and dry.  Neurological:     Mental Status: She is alert and oriented to person, place, and time.           Assessment & Plan:   Acute cystitis without hematuria -     nitrofurantoin, macrocrystal-monohydrate, (MACROBID) 100 MG capsule; Take 1 capsule (100 mg total) by mouth 2 (two) times daily. -     Urinalysis, Routine w reflex microscopic -     Urine Culture  Vitamin D deficiency -     VITAMIN D 25 Hydroxy (Vit-D Deficiency, Fractures)  Anemia, unspecified type -     Iron,Total/Total Iron Binding Cap -     Ferritin  Leg Thomas  Recheck iron WITH ferritin, continue magnesium.  Follow up with ortho Has good pulses bilaterally, Thomas is at night not with walking, no claudication.   We had a prolonged discussion about these complex clinical issues and went over the various important aspects to consider. All questions were answered.

## 2019-03-13 LAB — IRON, TOTAL/TOTAL IRON BINDING CAP
%SAT: 15 % (calc) — ABNORMAL LOW (ref 16–45)
Iron: 50 ug/dL (ref 45–160)
TIBC: 343 mcg/dL (calc) (ref 250–450)

## 2019-03-13 LAB — URINALYSIS, ROUTINE W REFLEX MICROSCOPIC
Bacteria, UA: NONE SEEN /HPF
Bilirubin Urine: NEGATIVE
Glucose, UA: NEGATIVE
Hgb urine dipstick: NEGATIVE
Hyaline Cast: NONE SEEN /LPF
Ketones, ur: NEGATIVE
Nitrite: NEGATIVE
Protein, ur: NEGATIVE
RBC / HPF: NONE SEEN /HPF (ref 0–2)
Specific Gravity, Urine: 1.022 (ref 1.001–1.03)
pH: 6 (ref 5.0–8.0)

## 2019-03-13 LAB — VITAMIN D 25 HYDROXY (VIT D DEFICIENCY, FRACTURES): Vit D, 25-Hydroxy: 59 ng/mL (ref 30–100)

## 2019-03-13 LAB — URINE CULTURE
MICRO NUMBER:: 594370
SPECIMEN QUALITY:: ADEQUATE

## 2019-03-13 LAB — FERRITIN: Ferritin: 14 ng/mL — ABNORMAL LOW (ref 16–288)

## 2019-03-14 ENCOUNTER — Other Ambulatory Visit: Payer: Self-pay | Admitting: Physician Assistant

## 2019-03-14 MED ORDER — SULFAMETHOXAZOLE-TRIMETHOPRIM 800-160 MG PO TABS: 1.0000 | ORAL_TABLET | Freq: Two times a day (BID) | ORAL | 0 refills | Status: DC

## 2019-03-14 NOTE — Progress Notes (Signed)
Sent in bactrim, told patient to stop macrobid, ER precautions discussed, may need CT rue out kidney stone

## 2019-03-16 ENCOUNTER — Other Ambulatory Visit: Payer: Self-pay | Admitting: Physician Assistant

## 2019-04-03 ENCOUNTER — Other Ambulatory Visit: Payer: Self-pay

## 2019-04-03 DIAGNOSIS — Z1211 Encounter for screening for malignant neoplasm of colon: Secondary | ICD-10-CM

## 2019-04-03 DIAGNOSIS — Z1212 Encounter for screening for malignant neoplasm of rectum: Secondary | ICD-10-CM | POA: Diagnosis not present

## 2019-04-09 ENCOUNTER — Other Ambulatory Visit: Payer: Self-pay | Admitting: Anesthesiology

## 2019-04-09 DIAGNOSIS — M5416 Radiculopathy, lumbar region: Secondary | ICD-10-CM

## 2019-04-11 ENCOUNTER — Ambulatory Visit: Payer: Medicare Other | Admitting: Adult Health

## 2019-05-07 ENCOUNTER — Ambulatory Visit
Admission: RE | Admit: 2019-05-07 | Discharge: 2019-05-07 | Disposition: A | Discharge status: Home / Self Care | Payer: Medicare Other | Source: Ambulatory Visit | Attending: Anesthesiology | Admitting: Anesthesiology

## 2019-05-07 ENCOUNTER — Other Ambulatory Visit: Payer: Self-pay

## 2019-05-07 DIAGNOSIS — M5416 Radiculopathy, lumbar region: Secondary | ICD-10-CM

## 2019-05-07 MED ORDER — GADOBENATE DIMEGLUMINE 529 MG/ML IV SOLN
12.0000 mL | Freq: Once | INTRAVENOUS | Status: AC | PRN
  Administered 2019-05-07: 12 mL via INTRAVENOUS

## 2019-05-15 ENCOUNTER — Encounter: Payer: Self-pay | Admitting: Physician Assistant

## 2019-05-31 ENCOUNTER — Encounter: Payer: Self-pay | Admitting: Physician Assistant
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
## 2019-06-24 ENCOUNTER — Encounter: Payer: Self-pay | Admitting: Physician Assistant

## 2019-06-24 ENCOUNTER — Ambulatory Visit (INDEPENDENT_AMBULATORY_CARE_PROVIDER_SITE_OTHER): Payer: Medicare Other | Admitting: Physician Assistant

## 2019-06-24 ENCOUNTER — Other Ambulatory Visit: Payer: Self-pay

## 2019-06-24 VITALS — BP 122/66 | HR 81 | Temp 98.1°F | Ht 60.0 in | Wt 134.0 lb

## 2019-06-24 DIAGNOSIS — R7309 Other abnormal glucose: Secondary | ICD-10-CM

## 2019-06-24 DIAGNOSIS — Z23 Encounter for immunization: Secondary | ICD-10-CM

## 2019-06-24 DIAGNOSIS — Z0000 Encounter for general adult medical examination without abnormal findings: Secondary | ICD-10-CM | POA: Diagnosis not present

## 2019-06-24 DIAGNOSIS — Z1389 Encounter for screening for other disorder: Secondary | ICD-10-CM

## 2019-06-24 DIAGNOSIS — F419 Anxiety disorder, unspecified: Secondary | ICD-10-CM

## 2019-06-24 DIAGNOSIS — Z136 Encounter for screening for cardiovascular disorders: Secondary | ICD-10-CM

## 2019-06-24 DIAGNOSIS — K2100 Gastro-esophageal reflux disease with esophagitis, without bleeding: Secondary | ICD-10-CM

## 2019-06-24 DIAGNOSIS — Z79899 Other long term (current) drug therapy: Secondary | ICD-10-CM

## 2019-06-24 DIAGNOSIS — G894 Chronic pain syndrome: Secondary | ICD-10-CM

## 2019-06-24 DIAGNOSIS — E611 Iron deficiency: Secondary | ICD-10-CM

## 2019-06-24 DIAGNOSIS — E538 Deficiency of other specified B group vitamins: Secondary | ICD-10-CM

## 2019-06-24 DIAGNOSIS — E559 Vitamin D deficiency, unspecified: Secondary | ICD-10-CM

## 2019-06-24 DIAGNOSIS — G8929 Other chronic pain: Secondary | ICD-10-CM

## 2019-06-24 DIAGNOSIS — I10 Essential (primary) hypertension: Secondary | ICD-10-CM

## 2019-06-24 DIAGNOSIS — E782 Mixed hyperlipidemia: Secondary | ICD-10-CM

## 2019-06-24 DIAGNOSIS — M85852 Other specified disorders of bone density and structure, left thigh: Secondary | ICD-10-CM

## 2019-06-24 DIAGNOSIS — Z0001 Encounter for general adult medical examination with abnormal findings: Secondary | ICD-10-CM

## 2019-06-24 DIAGNOSIS — G2581 Restless legs syndrome: Secondary | ICD-10-CM

## 2019-06-24 DIAGNOSIS — M549 Dorsalgia, unspecified: Secondary | ICD-10-CM

## 2019-06-24 DIAGNOSIS — E039 Hypothyroidism, unspecified: Secondary | ICD-10-CM

## 2019-06-24 MED ORDER — ALPRAZOLAM 1 MG PO TABS: ORAL_TABLET | ORAL | 0 refills | Status: DC

## 2019-06-24 NOTE — Patient Instructions (Signed)
   Ask insurance and pharmacy about shingrix - new vaccine   Can go to https://www.cdc.gov/vaccines/vpd/shingles/public/shingrix/index.html for more information  Shingrix Vaccination  Two vaccines are licensed and recommended to prevent shingles in the [place] Zoster vaccine live (ZVL, Zostavax) has been in use since 2006. Recombinant zoster vaccine (RZV, Shingrix), has been in use since 2017 and is recommended by ACIP as the preferred shingles vaccine.  What Everyone Should Know about Shingles Vaccine (Shingrix) One of the Recommended Vaccines by Disease Shingles vaccination is the only way to protect against shingles and postherpetic neuralgia (PHN), the most common complication from shingles. CDC recommends that healthy adults 50 years and older get two doses of the shingles vaccine called Shingrix (recombinant zoster vaccine), separated by 2 to 6 months, to prevent shingles and the complications from the disease. Your doctor or pharmacist can give you Shingrix as a shot in your upper arm. Shingrix provides strong protection against shingles and PHN. Two doses of Shingrix is more than 90% effective at preventing shingles and PHN. Protection stays above 85% for at least the first four years after you get vaccinated. Shingrix is the preferred vaccine, over Zostavax (zoster vaccine live), a shingles vaccine in use since 2006. Zostavax may still be used to prevent shingles in healthy adults 60 years and older. For example, you could use Zostavax if a person is allergic to Shingrix, prefers Zostavax, or requests immediate vaccination and Shingrix is unavailable. Who Should Get Shingrix? Healthy adults 50 years and older should get two doses of Shingrix, separated by 2 to 6 months. You should get Shingrix even if in the past you . had shingles  . received Zostavax  . are not sure if you had chickenpox There is no maximum age for getting Shingrix. If you had shingles in the past, you can get  Shingrix to help prevent future occurrences of the disease. There is no specific length of time that you need to wait after having shingles before you can receive Shingrix, but generally you should make sure the shingles rash has gone away before getting vaccinated. You can get Shingrix whether or not you remember having had chickenpox in the past. Studies show that more than 99% of Americans 40 years and older have had chickenpox, even if they don't remember having the disease. Chickenpox and shingles are related because they are caused by the same virus (varicella zoster virus). After a person recovers from chickenpox, the virus stays dormant (inactive) in the body. It can reactivate years later and cause shingles. If you had Zostavax in the recent past, you should wait at least eight weeks before getting Shingrix. Talk to your healthcare provider to determine the best time to get Shingrix. Shingrix is available in doctor's offices and pharmacies. To find doctor's offices or pharmacies near you that offer the vaccine, visit HealthMap Vaccine FinderExternal. If you have questions about Shingrix, talk with your healthcare provider. Vaccine for Those 50 Years and Older  Shingrix reduces the risk of shingles and PHN by more than 90% in people 50 and older. CDC recommends the vaccine for healthy adults 50 and older.  Who Should Not Get Shingrix? You should not get Shingrix if you: . have ever had a severe allergic reaction to any component of the vaccine or after a dose of Shingrix  . tested negative for immunity to varicella zoster virus. If you test negative, you should get chickenpox vaccine.  . currently have shingles  . currently are pregnant or breastfeeding.   Women who are pregnant or breastfeeding should wait to get Shingrix.  . receive specific antiviral drugs (acyclovir, famciclovir, or valacyclovir) 24 hours before vaccination (avoid use of these antiviral drugs for 14 days after vaccination)-  zoster vaccine live only If you have a minor acute (starts suddenly) illness, such as a cold, you may get Shingrix. But if you have a moderate or severe acute illness, you should usually wait until you recover before getting the vaccine. This includes anyone with a temperature of 101.3F or higher. The side effects of the Shingrix are temporary, and usually last 2 to 3 days. While you may experience pain for a few days after getting Shingrix, the pain will be less severe than having shingles and the complications from the disease. How Well Does Shingrix Work? Two doses of Shingrix provides strong protection against shingles and postherpetic neuralgia (PHN), the most common complication of shingles. . In adults 50 to 69 years old who got two doses, Shingrix was 97% effective in preventing shingles; among adults 70 years and older, Shingrix was 91% effective.  . In adults 50 to 69 years old who got two doses, Shingrix was 91% effective in preventing PHN; among adults 70 years and older, Shingrix was 89% effective. Shingrix protection remained high (more than 85%) in people 70 years and older throughout the four years following vaccination. Since your risk of shingles and PHN increases as you get older, it is important to have strong protection against shingles in your older years. Top of Page  What Are the Possible Side Effects of Shingrix? Studies show that Shingrix is safe. The vaccine helps your body create a strong defense against shingles. As a result, you are likely to have temporary side effects from getting the shots. The side effects may affect your ability to do normal daily activities for 2 to 3 days. Most people got a sore arm with mild or moderate pain after getting Shingrix, and some also had redness and swelling where they got the shot. Some people felt tired, had muscle pain, a headache, shivering, fever, stomach pain, or nausea. About 1 out of 6 people who got Shingrix experienced side  effects that prevented them from doing regular activities. Symptoms went away on their own in about 2 to 3 days. Side effects were more common in younger people. You might have a reaction to the first or second dose of Shingrix, or both doses. If you experience side effects, you may choose to take over-the-counter pain medicine such as ibuprofen or acetaminophen. If you experience side effects from Shingrix, you should report them to the Vaccine Adverse Event Reporting System (VAERS). Your doctor might file this report, or you can do it yourself through the VAERS websiteExternal, or by calling 1-800-822-7967. If you have any questions about side effects from Shingrix, talk with your doctor. The shingles vaccine does not contain thimerosal (a preservative containing mercury). Top of Page  When Should I See a Doctor Because of the Side Effects I Experience From Shingrix? In clinical trials, Shingrix was not associated with serious adverse events. In fact, serious side effects from vaccines are extremely rare. For example, for every 1 million doses of a vaccine given, only one or two people may have a severe allergic reaction. Signs of an allergic reaction happen within minutes or hours after vaccination and include hives, swelling of the face and throat, difficulty breathing, a fast heartbeat, dizziness, or weakness. If you experience these or any other life-threatening symptoms, see a   doctor right away. Shingrix causes a strong response in your immune system, so it may produce short-term side effects more intense than you are used to from other vaccines. These side effects can be uncomfortable, but they are expected and usually go away on their own in 2 or 3 days. Top of Page  How Can I Pay For Shingrix? There are several ways shingles vaccine may be paid for: Medicare . Medicare Part D plans cover the shingles vaccine, but there may be a cost to you depending on your plan. There may be a copay for the  vaccine, or you may need to pay in full then get reimbursed for a certain amount.  . Medicare Part B does not cover the shingles vaccine. Medicaid . Medicaid may or may not cover the vaccine. Contact your insurer to find out. Private health insurance . Many private health insurance plans will cover the vaccine, but there may be a cost to you depending on your plan. Contact your insurer to find out. Vaccine assistance programs . Some pharmaceutical companies provide vaccines to eligible adults who cannot afford them. You may want to check with the vaccine manufacturer, [employer], about Shingrix. If you do not currently have health insurance, learn more about affordable health coverage optionsExternal. To find doctor's offices or pharmacies near you that offer the vaccine, visit HealthMap Vaccine FinderExternal.  

## 2019-06-25 LAB — COMPLETE METABOLIC PANEL WITH GFR
AG Ratio: 1.8 (calc) (ref 1.0–2.5)
ALT: 10 U/L (ref 6–29)
AST: 14 U/L (ref 10–35)
Albumin: 4.2 g/dL (ref 3.6–5.1)
Alkaline phosphatase (APISO): 48 U/L (ref 37–153)
BUN: 16 mg/dL (ref 7–25)
CO2: 26 mmol/L (ref 20–32)
Calcium: 9.5 mg/dL (ref 8.6–10.4)
Chloride: 106 mmol/L (ref 98–110)
Creat: 0.68 mg/dL (ref 0.50–0.99)
GFR, Est African American: 104 mL/min/{1.73_m2} (ref >=60)
GFR, Est Non African American: 90 mL/min/{1.73_m2} (ref >=60)
Globulin: 2.3 g/dL (calc) (ref 1.9–3.7)
Glucose, Bld: 88 mg/dL (ref 65–99)
Potassium: 3.8 mmol/L (ref 3.5–5.3)
Sodium: 141 mmol/L (ref 135–146)
Total Bilirubin: 0.5 mg/dL (ref 0.2–1.2)
Total Protein: 6.5 g/dL (ref 6.1–8.1)

## 2019-06-25 LAB — URINALYSIS, ROUTINE W REFLEX MICROSCOPIC
Bilirubin Urine: NEGATIVE
Glucose, UA: NEGATIVE
Hgb urine dipstick: NEGATIVE
Leukocytes,Ua: NEGATIVE
Nitrite: NEGATIVE
Protein, ur: NEGATIVE
Specific Gravity, Urine: 1.02 (ref 1.001–1.03)
pH: 5.5 (ref 5.0–8.0)

## 2019-06-25 LAB — CBC WITH DIFFERENTIAL/PLATELET
Absolute Monocytes: 347 cells/uL (ref 200–950)
Basophils Absolute: 50 cells/uL (ref 0–200)
Basophils Relative: 0.8 %
Eosinophils Absolute: 81 cells/uL (ref 15–500)
Eosinophils Relative: 1.3 %
HCT: 37.4 % (ref 35.0–45.0)
Hemoglobin: 12.7 g/dL (ref 11.7–15.5)
Lymphs Abs: 1525 cells/uL (ref 850–3900)
MCH: 30.9 pg (ref 27.0–33.0)
MCHC: 34 g/dL (ref 32.0–36.0)
MCV: 91 fL (ref 80.0–100.0)
MPV: 9 fL (ref 7.5–12.5)
Monocytes Relative: 5.6 %
Neutro Abs: 4197 cells/uL (ref 1500–7800)
Neutrophils Relative %: 67.7 %
Platelets: 338 10*3/uL (ref 140–400)
RBC: 4.11 10*6/uL (ref 3.80–5.10)
RDW: 13 % (ref 11.0–15.0)
Total Lymphocyte: 24.6 %
WBC: 6.2 10*3/uL (ref 3.8–10.8)

## 2019-06-25 LAB — LIPID PANEL
Cholesterol: 174 mg/dL (ref <200)
HDL: 79 mg/dL (ref >=50)
LDL Cholesterol (Calc): 78 mg/dL (calc)
Non-HDL Cholesterol (Calc): 95 mg/dL (calc) (ref <130)
Total CHOL/HDL Ratio: 2.2 (calc) (ref <5.0)
Triglycerides: 86 mg/dL (ref <150)

## 2019-06-25 LAB — IRON, TOTAL/TOTAL IRON BINDING CAP
%SAT: 47 % (calc) — ABNORMAL HIGH (ref 16–45)
Iron: 139 ug/dL (ref 45–160)
TIBC: 297 mcg/dL (calc) (ref 250–450)

## 2019-06-25 LAB — MAGNESIUM: Magnesium: 1.8 mg/dL (ref 1.5–2.5)

## 2019-06-25 LAB — TSH: TSH: 0.42 mIU/L (ref 0.40–4.50)

## 2019-06-25 LAB — VITAMIN D 25 HYDROXY (VIT D DEFICIENCY, FRACTURES): Vit D, 25-Hydroxy: 41 ng/mL (ref 30–100)

## 2019-06-25 LAB — MICROALBUMIN / CREATININE URINE RATIO
Creatinine, Urine: 112 mg/dL (ref 20–275)
Microalb Creat Ratio: 3 mcg/mg creat (ref <30)
Microalb, Ur: 0.3 mg/dL

## 2019-07-16 ENCOUNTER — Telehealth: Payer: Self-pay | Admitting: Nurse Practitioner

## 2019-07-16 ENCOUNTER — Other Ambulatory Visit: Payer: Self-pay | Admitting: Neurological Surgery

## 2019-07-16 DIAGNOSIS — M79605 Pain in left leg: Secondary | ICD-10-CM

## 2019-07-16 NOTE — Telephone Encounter (Signed)
Phone call to patient to verify medication list and allergies for myelogram procedure. Pt aware she will not need to hold any medications for this procedure. Pre and post procedure instructions reviewed with pt. 

## 2019-07-22 ENCOUNTER — Ambulatory Visit
Admission: RE | Admit: 2019-07-22 | Discharge: 2019-07-22 | Disposition: A | Discharge status: Home / Self Care | Payer: Medicare Other | Source: Ambulatory Visit | Attending: Neurological Surgery | Admitting: Neurological Surgery

## 2019-07-22 ENCOUNTER — Other Ambulatory Visit: Payer: Self-pay

## 2019-07-22 VITALS — BP 129/49 | HR 53

## 2019-07-22 DIAGNOSIS — G8929 Other chronic pain: Secondary | ICD-10-CM

## 2019-07-22 DIAGNOSIS — M79605 Pain in left leg: Secondary | ICD-10-CM

## 2019-07-22 MED ORDER — IOPAMIDOL (ISOVUE-M 200) INJECTION 41%
20.0000 mL | Freq: Once | INTRAMUSCULAR | Status: AC
  Administered 2019-07-22: 12:00:00 20 mL via INTRATHECAL

## 2019-07-22 MED ORDER — DIAZEPAM 5 MG PO TABS: 5.0000 mg | ORAL_TABLET | Freq: Once | ORAL | Status: DC

## 2019-07-22 NOTE — Discharge Instructions (Signed)

## 2019-08-29 LAB — HM MAMMOGRAPHY

## 2019-09-02 ENCOUNTER — Encounter: Payer: Self-pay | Admitting: *Deleted

## 2019-09-23 DIAGNOSIS — M238X2 Other internal derangements of left knee: Secondary | ICD-10-CM | POA: Diagnosis not present

## 2019-09-23 DIAGNOSIS — Z96652 Presence of left artificial knee joint: Secondary | ICD-10-CM | POA: Diagnosis not present

## 2019-09-23 DIAGNOSIS — M25562 Pain in left knee: Secondary | ICD-10-CM | POA: Diagnosis not present

## 2019-09-25 ENCOUNTER — Other Ambulatory Visit: Payer: Self-pay | Admitting: Adult Health

## 2019-09-25 ENCOUNTER — Encounter: Payer: Self-pay | Admitting: Adult Health

## 2019-09-25 ENCOUNTER — Other Ambulatory Visit: Payer: Medicare Other

## 2019-09-25 ENCOUNTER — Other Ambulatory Visit: Payer: Self-pay

## 2019-09-25 ENCOUNTER — Ambulatory Visit (INDEPENDENT_AMBULATORY_CARE_PROVIDER_SITE_OTHER): Payer: Medicare PPO | Admitting: Adult Health

## 2019-09-25 ENCOUNTER — Encounter: Payer: Self-pay | Admitting: Internal Medicine

## 2019-09-25 ENCOUNTER — Ambulatory Visit
Admission: RE | Admit: 2019-09-25 | Discharge: 2019-09-25 | Disposition: A | Discharge status: Home / Self Care | Payer: Medicare PPO | Source: Ambulatory Visit | Attending: Adult Health | Admitting: Adult Health

## 2019-09-25 VITALS — BP 124/78 | HR 83 | Temp 97.9°F | Wt 134.0 lb

## 2019-09-25 DIAGNOSIS — R1084 Generalized abdominal pain: Secondary | ICD-10-CM

## 2019-09-25 DIAGNOSIS — I700 Atherosclerosis of aorta: Secondary | ICD-10-CM | POA: Insufficient documentation

## 2019-09-25 DIAGNOSIS — R10817 Generalized abdominal tenderness: Secondary | ICD-10-CM

## 2019-09-25 DIAGNOSIS — K5730 Diverticulosis of large intestine without perforation or abscess without bleeding: Secondary | ICD-10-CM | POA: Diagnosis not present

## 2019-09-25 DIAGNOSIS — R110 Nausea: Secondary | ICD-10-CM | POA: Diagnosis not present

## 2019-09-25 DIAGNOSIS — K5790 Diverticulosis of intestine, part unspecified, without perforation or abscess without bleeding: Secondary | ICD-10-CM

## 2019-09-25 HISTORY — DX: Diverticulosis of intestine, part unspecified, without perforation or abscess without bleeding: K57.90

## 2019-09-25 MED ORDER — OMEPRAZOLE 20 MG PO CPDR: DELAYED_RELEASE_CAPSULE | ORAL | 0 refills | Status: DC

## 2019-09-25 MED ORDER — ONDANSETRON HCL 4 MG PO TABS: 4.0000 mg | ORAL_TABLET | Freq: Three times a day (TID) | ORAL | 0 refills | Status: DC | PRN

## 2019-09-25 MED ORDER — SUCRALFATE 1 G PO TABS: 1.0000 g | ORAL_TABLET | Freq: Four times a day (QID) | ORAL | 1 refills | Status: DC

## 2019-09-25 NOTE — Progress Notes (Signed)
Assessment and Plan:  Monique Thomas was seen today for abdominal pain.  Diagnoses and all orders for this visit:  Generalized abdominal pain Generalized abdominal tenderness without rebound tenderness Nausea Non-specific/localized tenderness without rebound; ? Mildly worse upper abdominal tenderness Check upper abdominal organ labs as per orders below No other specific sx; hx appendectomy, hysterectomy, non-obstructive renal calculi Check CT to r/o abcess, acute cholecystitis, kidney stone/complication (dribbling urine atypical); differential includes ulcer or gastritis though with severity of pain and tenderness will proceed with STAT CT  Advised fluids only, no food until CT results NO ETOH, STOP NSAIDS Abx if indicated by labs or imaging;  Refer to ED if suggestive of surgical emergency or if any worsening of sx Add PPI/carafate if workup unremarkable for possible gastritis  -     CBC with Diff -     COMPLETE METABOLIC PANEL WITH GFR -     Amylase -     Lipase -     Urinalysis w microscopic + reflex cultur -     CT Abdomen Pelvis Wo Contrast; Future  Further disposition pending results of labs. Discussed med's effects and SE's.   Over 30 minutes of exam, counseling, chart review, and critical decision making was performed.   Future Appointments  Date Time Provider [place]  03/04/2020 10:00 AM Liane Comber, NP [facility] None  06/24/2020  3:00 PM Vicie Mutters, PA-C [facility] None    ------------------------------------------------------------------------------------------------------------------   HPI BP 124/78   Pulse 83   Temp 97.9 F (36.6 C)   Wt 134 lb (60.8 kg)   SpO2 98%   BMI 26.17 kg/m   69 y.o.female hx of GERD, renal calculi, chronic pain syndrome (lumbar pain - follows with pain management getting injections, on gabapentin) represents for evaluation of abdominal pain.   She reports gradual onset over yesterday with nausea, last night she felt  nauseous, sick to her stomach but without emesis; started having abdominal pain later in the evening, got in bed and braced her abdomen with a pillow, was 10/10 "like strong period pain" and cramping, primarily periumbillical. She had difficulty sleeping, finally felt asleep around 5 am after she got tired enough. She continues to experience nausea, also report intense back pain (midline), denies sense of pain radiating through to back or around flank. Pain is constant and non-radiating, mildly improved now to 7/10. Does endorse belching, gassiness bloating.   She reports sx started mid day yesterday, not after any atypical meal, had steak biscuit for brunch, had tomato soup and grilled cheese last night. Husband at home without sx.  Denies fever, does report some chills last evening with severe pain.   Bracing with pillow seems to help; hasn't tried any medications; fetal position is most comfortable. She reports having regular bowel movements, 2 today and yesterday, normal BMs.   She does use aleve 2 tabs PRN; last was 2 days ago, uses occasionally only. Hasn't increased use recently. Denies ETOH use. Does drink coffee, had 1 cup this AM. Otherwise hasn't eaten today.   She reports having some dribbling of urine before making it to bathroom, atypical, over the last week. Denies dysuria, urgency, frequency, normal output, somewhat dark yellow, denies odor, hematuria, sediment.   Did have severe vaginal yeast infection last month, resolved after 4 tabs of diflucan. Had normal exam by GYN last month. Denies current vaginal sx.   She reports GERD hx is remote, not recently on medication for over 10 years.   Doesn't feel like kidney stone,  last remote.  She has had appendectomy.  Reports this pain reminds her of appendix pain or very severe menstrual cramps. She has had abdominal hysterectomy.  Lab Results  Component Value Date   GFRNONAA 90 06/24/2019   Last colonoscopy 2008, hx of polyps, had  cologuard which was neg 04/2017. Last abd imaging 08/2014 showed non-obstructive R renal calculi.   Past Medical History:  Diagnosis Date  . Anemia   . Anxiety   . Arthritis   . Back pain, chronic   . Cancer ([place])  2 weeks ago    skin cancer  left anterior wrist   and right side of  face     . Depression   . GERD (gastroesophageal reflux disease)   . Hyperlipidemia   . Hypothyroidism   . Kidney stone    2015  . Thyroid disease   . Umbilical hernia   . Unspecified vitamin D deficiency      Allergies  Allergen Reactions  . Celebrex [Celecoxib] Other (See Comments)    "panic attack"  . Other Other (See Comments)    Skin bruises very easily and peels back  . Topamax [Topiramate] Other (See Comments)    MEMORY  . Entex Lq [Phenylephrine-Guaifenesin] Rash  . Fiorinal [Butalbital-Aspirin-Caffeine] Rash    Current Outpatient Medications on File Prior to Visit  Medication Sig  . ALPRAZolam (XANAX) 1 MG tablet Take 1/2 to 1 tablet  At Bedtime & please try to limit to 5 days /week to avoid addiction  . Cholecalciferol (VITAMIN D3 PO) Take 5,000 Units by mouth 2 (two) times a day.   . Cyanocobalamin (B-12 PO) Take 1,000 mcg by mouth daily.   Marland Kitchen estradiol (ESTRACE) 1 MG tablet Take 1 mg by mouth daily.  . Ferrous Sulfate (IRON) 325 (65 Fe) MG TABS Take 65 mg by mouth every other day.  . gabapentin (NEURONTIN) 300 MG capsule Take 300 mg by mouth 3 (three) times daily.  . Magnesium 250 MG TABS Take 500 mg by mouth 2 (two) times a day.   Marland Kitchen SYNTHROID 75 MCG tablet Take 75 mcg by mouth daily before breakfast.    No current facility-administered medications on file prior to visit.    ROS: all negative except above.   Physical Exam:  BP 124/78   Pulse 83   Temp 97.9 F (36.6 C)   Wt 134 lb (60.8 kg)   SpO2 98%   BMI 26.17 kg/m   General Appearance: Well nourished, in no acute distress other than appears in pain, uncomfortable.  Eyes: PERRLA, conjunctiva no swelling or  erythema ENT/Mouth: Mask in place; denies oral sx; exam deferred in light of pandemic. Hearing normal.  Neck: Supple Respiratory: Respiratory effort normal, BS equal bilaterally without rales, rhonchi, wheezing or stridor.  Cardio: RRR with no MRGs. Brisk peripheral pulses without edema.  Abdomen: Soft, quiet infrequent BS. Generalized tenderness to light palpation, without rebound. Worse upper abd, epigastric, LUQ; with some guarding; no repound, no identified hernias or palpable masses.  Lymphatics: Non tender without lymphadenopathy.  Musculoskeletal: No obvious deformity, normal gait.  Skin: Warm, dry without rashes, lesions, ecchymosis.  Neuro:  Normal muscle tone, no cerebellar symptoms.  Psych: Awake and oriented X 3, mildly tearful affect, Insight and Judgment appropriate.     Izora Ribas, NP 11:18 AM [facility]

## 2019-09-25 NOTE — Patient Instructions (Addendum)
Please stick to broths, gatorade, etc fluids until we have abdominal imaging results back - I will call you to report  Avoid caffeine (coffee, dark sodas), NSAIDs (ibuprofen, aleve, etc), alcohol   If any sudden changes please contact the office  If workup is negative will add medications for gastritis  Abdominal Pain, Adult Pain in the abdomen (abdominal pain) can be caused by many things. Often, abdominal pain is not serious and it gets better with no treatment or by being treated at home. However, sometimes abdominal pain is serious. Your health care provider will ask questions about your medical history and do a physical exam to try to determine the cause of your abdominal pain. Follow these instructions at home:  Medicines  Take over-the-counter and prescription medicines only as told by your health care provider.  Do not take a laxative unless told by your health care provider. General instructions  Watch your condition for any changes.  Drink enough fluid to keep your urine pale yellow.  Keep all follow-up visits as told by your health care provider. This is important. Contact a health care provider if:  Your abdominal pain changes or gets worse.  You are not hungry or you lose weight without trying.  You are constipated or have diarrhea for more than 2-3 days.  You have pain when you urinate or have a bowel movement.  Your abdominal pain wakes you up at night.  Your pain gets worse with meals, after eating, or with certain foods.  You are vomiting and cannot keep anything down.  You have a fever.  You have blood in your urine. Get help right away if:  Your pain does not go away as soon as your health care provider told you to expect.  You cannot stop vomiting.  Your pain is only in areas of the abdomen, such as the right side or the left lower portion of the abdomen. Pain on the right side could be caused by appendicitis.  You have bloody or black stools,  or stools that look like tar.  You have severe pain, cramping, or bloating in your abdomen.  You have signs of dehydration, such as: ? Dark urine, very little urine, or no urine. ? Cracked lips. ? Dry mouth. ? Sunken eyes. ? Sleepiness. ? Weakness.  You have trouble breathing or chest pain. Summary  Often, abdominal pain is not serious and it gets better with no treatment or by being treated at home. However, sometimes abdominal pain is serious.  Watch your condition for any changes.  Take over-the-counter and prescription medicines only as told by your health care provider.  Contact a health care provider if your abdominal pain changes or gets worse.  Get help right away if you have severe pain, cramping, or bloating in your abdomen. This information is not intended to replace advice given to you by your health care provider. Make sure you discuss any questions you have with your health care provider. Document Revised: 01/14/2019 Document Reviewed: 01/14/2019 Elsevier Patient Education  [place].

## 2019-09-26 ENCOUNTER — Telehealth: Payer: Self-pay | Admitting: Adult Health

## 2019-09-26 ENCOUNTER — Other Ambulatory Visit: Payer: Self-pay | Admitting: Adult Health

## 2019-09-26 DIAGNOSIS — Z96652 Presence of left artificial knee joint: Secondary | ICD-10-CM | POA: Diagnosis not present

## 2019-09-26 DIAGNOSIS — M25562 Pain in left knee: Secondary | ICD-10-CM | POA: Diagnosis not present

## 2019-09-26 MED ORDER — PROMETHAZINE HCL 25 MG PO TABS: 12.5000 mg | ORAL_TABLET | Freq: Four times a day (QID) | ORAL | 0 refills | Status: DC | PRN

## 2019-09-27 LAB — CBC WITH DIFFERENTIAL/PLATELET
Absolute Monocytes: 442 cells/uL (ref 200–950)
Basophils Absolute: 38 cells/uL (ref 0–200)
Basophils Relative: 0.8 %
Eosinophils Absolute: 91 cells/uL (ref 15–500)
Eosinophils Relative: 1.9 %
HCT: 38.2 % (ref 35.0–45.0)
Hemoglobin: 13 g/dL (ref 11.7–15.5)
Lymphs Abs: 1598 cells/uL (ref 850–3900)
MCH: 31 pg (ref 27.0–33.0)
MCHC: 34 g/dL (ref 32.0–36.0)
MCV: 91.2 fL (ref 80.0–100.0)
MPV: 8.9 fL (ref 7.5–12.5)
Monocytes Relative: 9.2 %
Neutro Abs: 2630 cells/uL (ref 1500–7800)
Neutrophils Relative %: 54.8 %
Platelets: 347 10*3/uL (ref 140–400)
RBC: 4.19 10*6/uL (ref 3.80–5.10)
RDW: 11.8 % (ref 11.0–15.0)
Total Lymphocyte: 33.3 %
WBC: 4.8 10*3/uL (ref 3.8–10.8)

## 2019-09-27 LAB — LIPASE: Lipase: 18 U/L (ref 7–60)

## 2019-09-27 LAB — URINALYSIS W MICROSCOPIC + REFLEX CULTURE
Bacteria, UA: NONE SEEN /HPF
Bilirubin Urine: NEGATIVE
Glucose, UA: NEGATIVE
Hgb urine dipstick: NEGATIVE
Hyaline Cast: NONE SEEN /LPF
Nitrites, Initial: NEGATIVE
Protein, ur: NEGATIVE
RBC / HPF: NONE SEEN /HPF (ref 0–2)
Specific Gravity, Urine: 1.016 (ref 1.001–1.03)
WBC, UA: NONE SEEN /HPF (ref 0–5)
pH: 7 (ref 5.0–8.0)

## 2019-09-27 LAB — URINE CULTURE
MICRO NUMBER:: 10014433
SPECIMEN QUALITY:: ADEQUATE

## 2019-09-27 LAB — CULTURE INDICATED

## 2019-09-27 LAB — AMYLASE: Amylase: 28 U/L (ref 21–101)

## 2019-09-27 LAB — COMPLETE METABOLIC PANEL WITH GFR
AG Ratio: 2 (calc) (ref 1.0–2.5)
ALT: 12 U/L (ref 6–29)
AST: 16 U/L (ref 10–35)
Albumin: 4.3 g/dL (ref 3.6–5.1)
Alkaline phosphatase (APISO): 45 U/L (ref 37–153)
BUN: 11 mg/dL (ref 7–25)
CO2: 24 mmol/L (ref 20–32)
Calcium: 9.4 mg/dL (ref 8.6–10.4)
Chloride: 107 mmol/L (ref 98–110)
Creat: 0.62 mg/dL (ref 0.50–0.99)
GFR, Est African American: 107 mL/min/{1.73_m2} (ref >=60)
GFR, Est Non African American: 93 mL/min/{1.73_m2} (ref >=60)
Globulin: 2.1 g/dL (calc) (ref 1.9–3.7)
Glucose, Bld: 91 mg/dL (ref 65–99)
Potassium: 4.4 mmol/L (ref 3.5–5.3)
Sodium: 142 mmol/L (ref 135–146)
Total Bilirubin: 0.5 mg/dL (ref 0.2–1.2)
Total Protein: 6.4 g/dL (ref 6.1–8.1)

## 2019-09-27 NOTE — Telephone Encounter (Signed)
Called to resolve medication issue; zofran discontinued. Phenergan sent in.

## 2019-10-02 DIAGNOSIS — M25562 Pain in left knee: Secondary | ICD-10-CM | POA: Diagnosis not present

## 2019-10-02 DIAGNOSIS — Z96652 Presence of left artificial knee joint: Secondary | ICD-10-CM | POA: Diagnosis not present

## 2019-10-04 DIAGNOSIS — Z981 Arthrodesis status: Secondary | ICD-10-CM | POA: Diagnosis not present

## 2019-10-04 DIAGNOSIS — M5416 Radiculopathy, lumbar region: Secondary | ICD-10-CM | POA: Diagnosis not present

## 2019-10-07 DIAGNOSIS — M25562 Pain in left knee: Secondary | ICD-10-CM | POA: Diagnosis not present

## 2019-10-07 DIAGNOSIS — Z96652 Presence of left artificial knee joint: Secondary | ICD-10-CM | POA: Diagnosis not present

## 2019-10-09 DIAGNOSIS — M25562 Pain in left knee: Secondary | ICD-10-CM | POA: Diagnosis not present

## 2019-10-09 DIAGNOSIS — Z96652 Presence of left artificial knee joint: Secondary | ICD-10-CM | POA: Diagnosis not present

## 2019-10-14 DIAGNOSIS — Z96652 Presence of left artificial knee joint: Secondary | ICD-10-CM | POA: Diagnosis not present

## 2019-10-14 DIAGNOSIS — M25562 Pain in left knee: Secondary | ICD-10-CM | POA: Diagnosis not present

## 2019-10-17 DIAGNOSIS — M25562 Pain in left knee: Secondary | ICD-10-CM | POA: Diagnosis not present

## 2019-10-17 DIAGNOSIS — Z96652 Presence of left artificial knee joint: Secondary | ICD-10-CM | POA: Diagnosis not present

## 2019-10-22 ENCOUNTER — Other Ambulatory Visit: Payer: Self-pay | Admitting: Adult Health

## 2019-10-22 DIAGNOSIS — M25562 Pain in left knee: Secondary | ICD-10-CM | POA: Diagnosis not present

## 2019-10-22 DIAGNOSIS — Z96652 Presence of left artificial knee joint: Secondary | ICD-10-CM | POA: Diagnosis not present

## 2019-10-24 DIAGNOSIS — Z96652 Presence of left artificial knee joint: Secondary | ICD-10-CM | POA: Diagnosis not present

## 2019-10-24 DIAGNOSIS — M25562 Pain in left knee: Secondary | ICD-10-CM | POA: Diagnosis not present

## 2019-10-26 IMAGING — MR MRI LUMBAR SPINE WITHOUT AND WITH CONTRAST
4 of 7 series · 24 of 48 positions shown · IV contrast (multihance)
Comparison: MRI 12/23/2016

CLINICAL DATA: Low back pain with left-sided radiculopathy

EXAM:
MRI LUMBAR SPINE WITHOUT AND WITH CONTRAST
TECHNIQUE: Multiplanar and multiecho pulse sequences of the lumbar spine were
obtained without and with intravenous contrast.
CONTRAST:  12mL MULTIHANCE GADOBENATE DIMEGLUMINE 529 MG/ML IV SOLN

[Series 2: T1 · sagittal · 4.0mm · 0.55mm/px · 5 of 16 slices shown (1 of 2)]
[im 1/16]
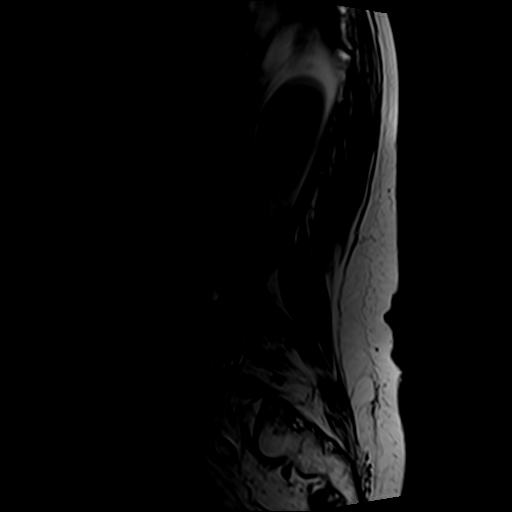
[im 4/16]
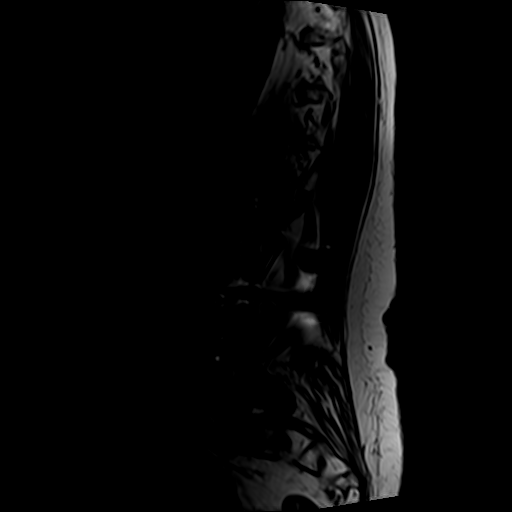
[im 8/16]
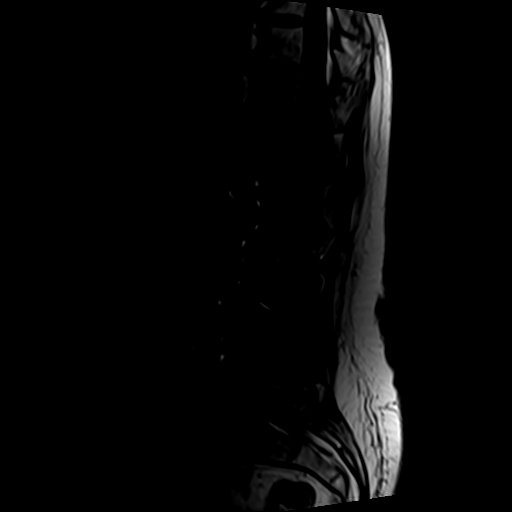
[im 12/16]
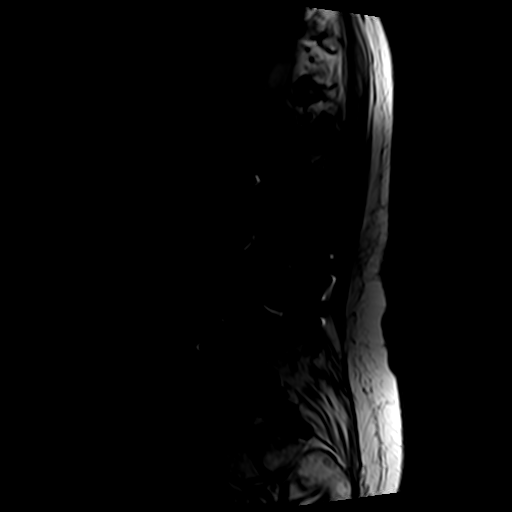
[im 16/16]
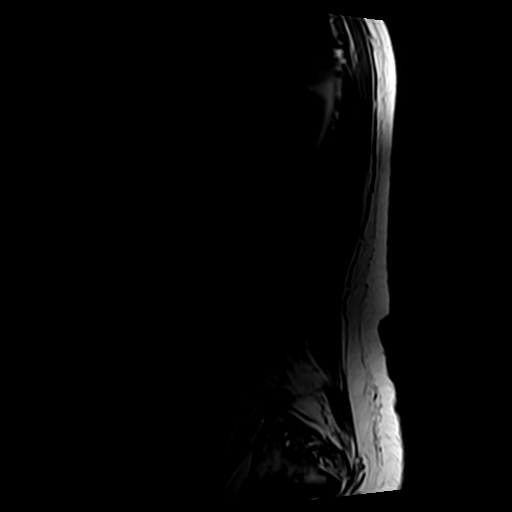

[Series 4: T2 · axial · 4.0mm · 0.70mm/px · z∈[-43,+182]mm · 8 of 38 slices shown (1 of 2)]
[im 1/38]
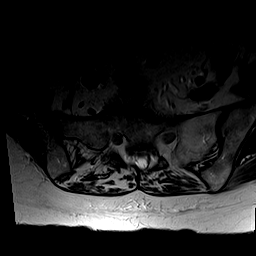
[im 5/38]
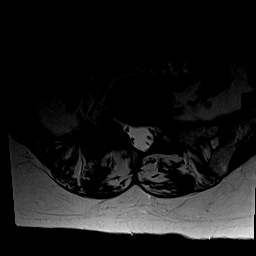
[im 13/38]
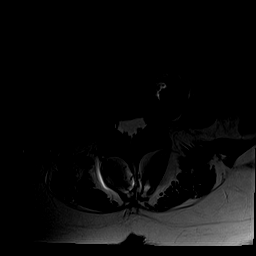
[im 17/38]
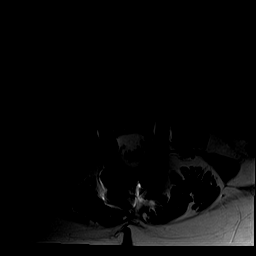
[im 21/38]
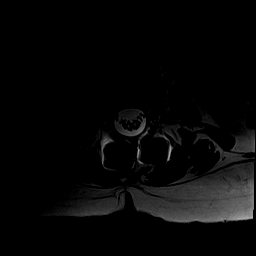
[im 25/38]
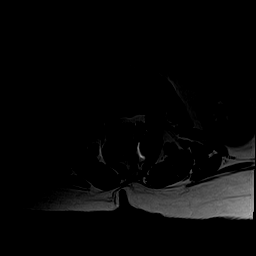
[im 33/38]
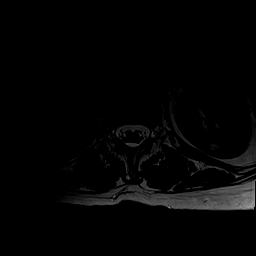
[im 38/38]
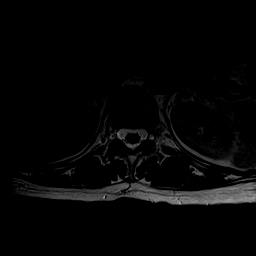

[Series 5: T1 · axial · 4.0mm · 0.35mm/px · z∈[-43,+156]mm · 7 of 38 slices shown (2 of 2)]
[im 1/38]
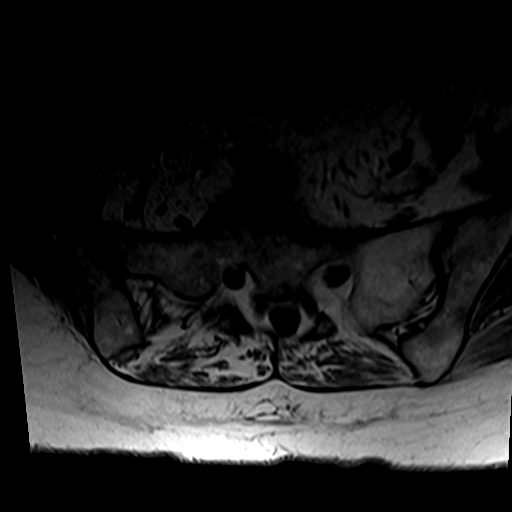
[im 5/38]
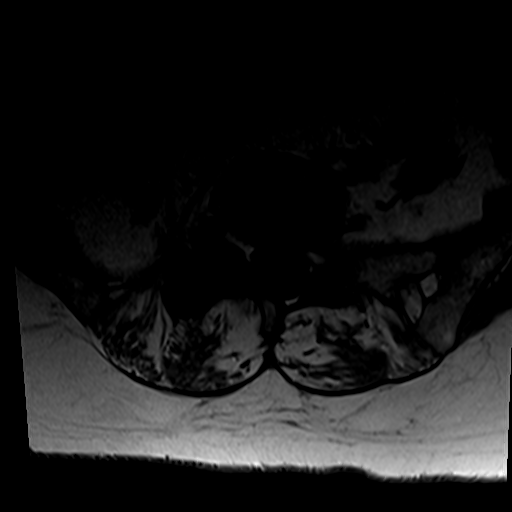
[im 13/38]
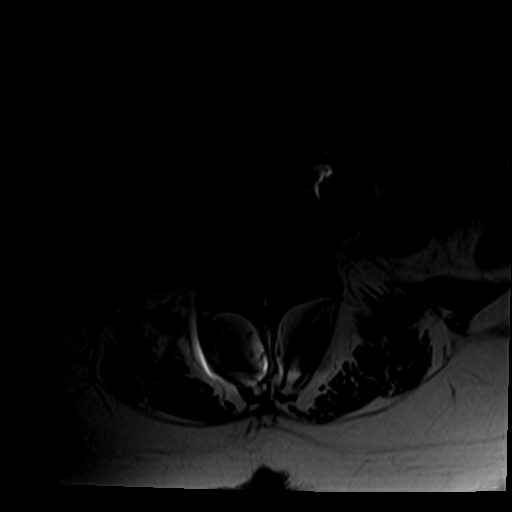
[im 17/38]
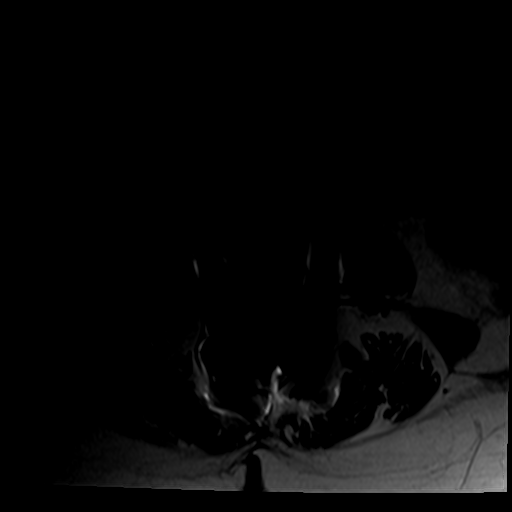
[im 21/38]
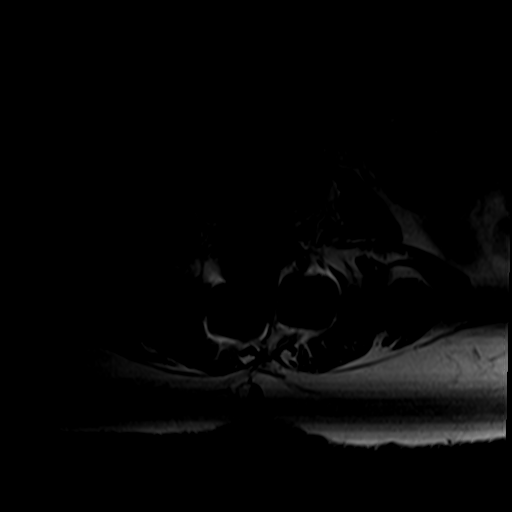
[im 25/38]
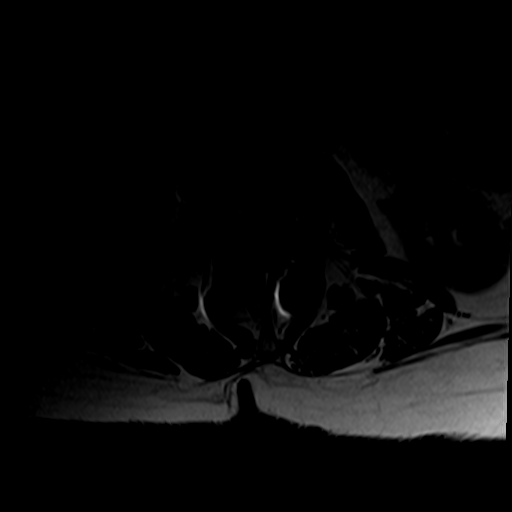
[im 33/38]
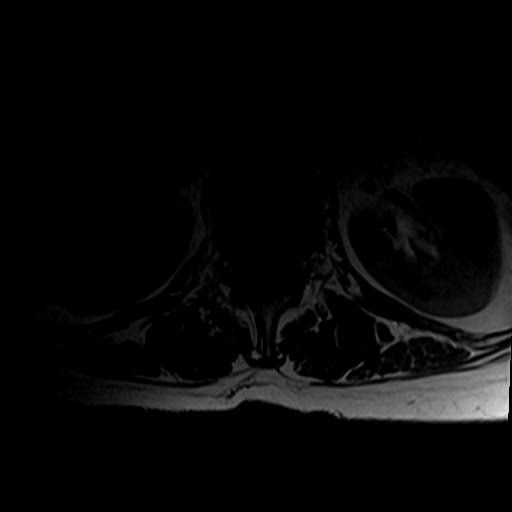

[Series 6: T2 · sagittal · 4.0mm · 0.55mm/px · 4 of 16 slices shown (2 of 2)]
[im 1/16]
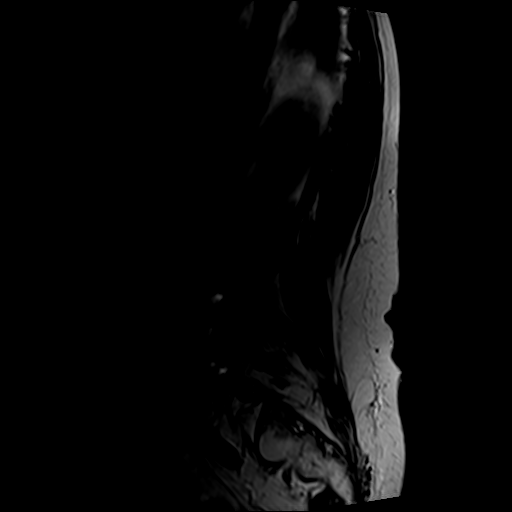
[im 6/16]
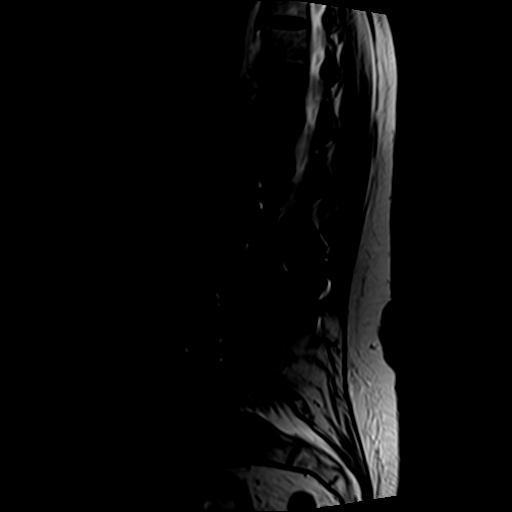
[im 11/16]
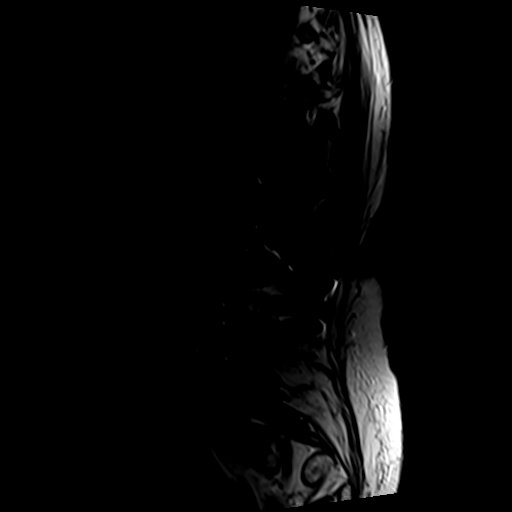
[im 16/16]
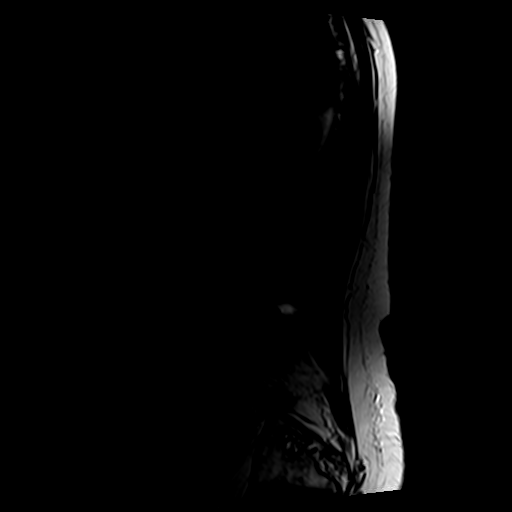

[24 of 48 positions shown; findings below may reference images not displayed]

FINDINGS: Segmentation:  Standard.

Alignment: Trace retrolisthesis L4 on L5. Improved alignment of the
L1-L2 level compared to prior.

Vertebrae: Interval XLIF of L1-L2. Prior L3-L4 XLIF and L2-3 PLIF.
Susceptibility artifact within these levels slightly degrades
evaluation of the adjacent structures. No evidence of fracture,
discitis, or bone lesion.

Conus medullaris and cauda equina: Conus extends to the L1 level.
Conus and cauda equina appear normal.

Paraspinal and other soft tissues: There are a few tiny cortically
based T2 hyperintensities within the bilateral kidneys, likely
cysts.

Disc levels:

T12-L1: Interval progression of disc height loss with mild diffuse
disc bulge and early bilateral facet arthrosis. No foraminal or
canal stenosis.

L1-L2: Interval XLIF. No significant residual disc material.
Bilateral facet arthrosis and ligamentum flavum buckling. Minimal
residual canal stenosis, improved from prior. No foraminal stenosis.

L2-L3: Prior PLIF.  No residual foraminal or canal stenosis.

L3-L4: Prior XLIF.  No residual foraminal or canal stenosis.

L4-L5: Progressive disc space narrowing and endplate spurring. Mild
diffuse disc bulge. Bilateral facet arthrosis and ligamentum flavum
buckling. No significant canal stenosis. Mild bilateral foraminal
stenosis and narrowing of the bilateral subarticular recesses,
progressed from prior

L5-S1: No significant disc bulge. Severe right and moderate left
facet arthropathy resulting in mild bilateral foraminal stenosis,
right greater than left. No significant canal stenosis. Findings are
similar to prior.

Following the administration of gadolinium contrast. There is no
enhancing perineural scarring/fibrosis.
IMPRESSION: 1. Interval L1-L2 XLIF with interval improvement in alignment and
canal stenosis at this level. Minimal residual canal stenosis. No
foraminal stenosis.
2. Progressive disc degeneration of L4-5 with progressive narrowing
of the bilateral neural foramina and bilateral subarticular
recesses.
3. Mild progressive disc height loss of T12-L1 without significant
stenosis.

## 2019-11-01 ENCOUNTER — Telehealth: Payer: Self-pay

## 2019-11-01 ENCOUNTER — Other Ambulatory Visit: Payer: Self-pay | Admitting: Adult Health

## 2019-11-01 MED ORDER — HYOSCYAMINE SULFATE SL 0.125 MG SL SUBL: 0.1250 mg | SUBLINGUAL_TABLET | SUBLINGUAL | 2 refills | Status: DC | PRN

## 2019-11-01 NOTE — Telephone Encounter (Signed)
Patient states that since Tuesday she is having a couple of bouts of Diarrhea in the morning. Does subside as she gets her day going. She has been diagnosed from her gynecologist of having a spastic colon. Also, states that she sold her house of which she had for 50 years. Would like to know if there is anything that you recommend she could do?

## 2019-11-04 DIAGNOSIS — M5416 Radiculopathy, lumbar region: Secondary | ICD-10-CM | POA: Diagnosis not present

## 2019-11-06 DIAGNOSIS — Z96652 Presence of left artificial knee joint: Secondary | ICD-10-CM | POA: Diagnosis not present

## 2019-11-06 DIAGNOSIS — M25562 Pain in left knee: Secondary | ICD-10-CM | POA: Diagnosis not present

## 2019-11-08 NOTE — Telephone Encounter (Signed)
Started taking the Levsin last Friday, still having the diarrhea with her FIRST bowel movements only. Seems to be feeling a bit better. Please advise.

## 2019-11-11 NOTE — Telephone Encounter (Signed)
Feels that the Levsin seems to be working. Informed her to try Immodium at night to see if that makes a difference.

## 2019-11-29 DIAGNOSIS — Z981 Arthrodesis status: Secondary | ICD-10-CM | POA: Diagnosis not present

## 2019-11-29 DIAGNOSIS — M47816 Spondylosis without myelopathy or radiculopathy, lumbar region: Secondary | ICD-10-CM | POA: Diagnosis not present

## 2019-12-11 NOTE — Progress Notes (Signed)
Assessment and Plan:  Monique Thomas was seen today for abdominal pain.  Diagnoses and all orders for this visit:  Epigastric tenderness Bloating/belching Gassiness Episodic loose stools  Persistent symptoms as per HPI ongoing following acute abdominal pain episode on 09/25/2019 (unremarkable workup incluging CBC, CMP, lipase/amylase, UA, Ct abd/pelvis without contrast at that time)  Hx of GERD, known small hiatal hernia carafate didn't help but unsure if ever did PPI prescribed originally -  Possible gastrtitis, silent GERD, h. Pylori; will also r/o celiacs Possible small bowel bacteria overgrowth Check H. Pylori and start PPI Add probiotic -  If etiology remains unclear recommended GI evaluation; she is due for colon cancer screening in August of this year She is very resistant to returning to GI at this time, consider presumptive abx treatment for small bowel bacteria overgrowth if unexplained and not improving with PPI/probiotic  -     CBC with Differential/Platelet -     COMPLETE METABOLIC PANEL WITH GFR -     Celiac Disease Comprehensive Panel with Reflexes -     H. pylori breath test -     Magnesium -     omeprazole (PRILOSEC) 20 MG capsule; Take 1 capsule Daily for Indigestion & Heartburn  Hypothyroidism, unspecified type -     TSH  Further disposition pending results of labs. Discussed med's effects and SE's.   Over 30 minutes of exam, counseling, chart review, and critical decision making was performed.   Future Appointments  Date Time Provider [place]  03/04/2020 10:00 AM Liane Comber, NP [facility] None  06/24/2020  3:00 PM Vicie Mutters, PA-C [facility] None    ------------------------------------------------------------------------------------------------------------------   HPI BP 112/74   Pulse 82   Temp 97.7 F (36.5 C)   Wt 128 lb 3.2 oz (58.2 kg)   SpO2 99%   BMI 25.04 kg/m   68 y.o.female hx of GERD, renal calculi, chronic pain syndrome  (lumbar pain - follows with pain management getting injections, on gabapentin) represents for evaluation of 3 months of persistent stomach rumbling/gurling after meals, increase belching/burning after meals, and daily AM diarrhea (1 episode 30 min after waking up, never after this, loose stool, not watery, orange/brown color, no atypical odor). She does endorse increased gassiness.   Notably she was seen in 09/25/2019 for upper abdominal pain, had workup including CBC, CMP, amylase, lipase, UA/culture which was unremarkable. CT abdomen/pelvis w/o contrast was also pursued which showed small hatal hernia, non-inflamed diverticulosis of the distal colon, 2 non-obstructing stones in R kidney, no acute changes. She reports current symptoms are persistent since that time though no further pain.   She does have hx of remote acid reflux, had been off of medication for over 10 years, denies recent reflux, choking, burning, hoarseness, cough. Has been taking carafate 4 times daily without improvement since last visit. She was prescribed omeprazole but doesn't believe she ever actually took this, doesn't recall. Hasn't taken in the last month. Denies ETOH or NSAID use.   She reports upper abdominal symptoms start 5-10 after starting to eat, cannot correlate to any particular type of foods, has been trying to eat bland foods, mashed potatoes, macaroni, etc but has also had similar sx with hamburger, fruits/veggies, etc.   There are no known alleviating factors. Has been taking 2 tabs imodium at night to try to relieve AM loose stools without notable improvement. She was prescribed levsin SL to try, has been taking in AM without notable improvement in sx.   BMI is Body mass  index is 25.04 kg/m., she reports good appetite, hasn't meant to lose weight. Does report eating slightly less, 3/4th of what she would normally eat due to feeling bad after eating.  Wt Readings from Last 3 Encounters:  12/12/19 128 lb 3.2 oz  (58.2 kg)  09/25/19 134 lb (60.8 kg)  06/24/19 134 lb (60.8 kg)       Past Medical History:  Diagnosis Date  . Anemia   . Anxiety   . Arthritis   . Back pain, chronic   . Cancer ([place])  2 weeks ago    skin cancer  left anterior wrist   and right side of  face     . Depression   . Diverticulosis 09/25/2019  . GERD (gastroesophageal reflux disease)   . Hyperlipidemia   . Hypothyroidism   . Kidney stone    2015  . Thyroid disease   . Umbilical hernia   . Unspecified vitamin D deficiency      Allergies  Allergen Reactions  . Celebrex [Celecoxib] Other (See Comments)    "panic attack"  . Other Other (See Comments)    Skin bruises very easily and peels back  . Topamax [Topiramate] Other (See Comments)    MEMORY  . Entex Lq [Phenylephrine-Guaifenesin] Rash  . Fiorinal [Butalbital-Aspirin-Caffeine] Rash    Current Outpatient Medications on File Prior to Visit  Medication Sig  . ALPRAZolam (XANAX) 1 MG tablet Take 1/2 to 1 tablet  At Bedtime & please try to limit to 5 days /week to avoid addiction  . Cholecalciferol (VITAMIN D3 PO) Take 5,000 Units by mouth 2 (two) times a day.   . Cyanocobalamin (B-12 PO) Take 1,000 mcg by mouth daily.   . cyclobenzaprine (FLEXERIL) 5 MG tablet Take 5 mg by mouth at bedtime.  . Cyclobenzaprine HCl (CYCLOBENZAPRINE 20 TD) Place onto the skin.  Marland Kitchen estradiol (ESTRACE) 1 MG tablet Take 1 mg by mouth daily.  Marland Kitchen Hyoscyamine Sulfate SL (LEVSIN/SL) 0.125 MG SUBL Place 0.125 mg under the tongue every 4 (four) hours as needed. For cramping and diarrhea.  . Loperamide HCl (IMODIUM PO) Take by mouth.  . Magnesium 250 MG TABS Take 500 mg by mouth 2 (two) times a day.   Marland Kitchen SYNTHROID 75 MCG tablet Take 75 mcg by mouth daily before breakfast.   . Ferrous Sulfate (IRON) 325 (65 Fe) MG TABS Take 65 mg by mouth every other day.  . gabapentin (NEURONTIN) 300 MG capsule Take 300 mg by mouth 3 (three) times daily.  Marland Kitchen omeprazole (PRILOSEC) 20 MG capsule Take 1  capsule Daily for Indigestion & Heartburn (Patient not taking: Reported on 12/12/2019)   No current facility-administered medications on file prior to visit.    ROS: Review of Systems  Constitutional: Positive for weight loss. Negative for chills, diaphoresis, fever and malaise/fatigue.  HENT: Negative.   Eyes: Negative.   Respiratory: Negative.   Cardiovascular: Negative.   Gastrointestinal: Positive for diarrhea (single episode in AM daily ). Negative for abdominal pain, blood in stool, constipation, heartburn, melena, nausea and vomiting.  Genitourinary: Negative.   Musculoskeletal: Positive for back pain (chronic, getting injections). Negative for joint pain and myalgias.  Skin: Negative for rash.  Neurological: Negative.  Negative for dizziness, tingling, sensory change and headaches.  Endo/Heme/Allergies: Negative for polydipsia.  Psychiatric/Behavioral: Negative for depression and substance abuse. The patient is not nervous/anxious and does not have insomnia.      Physical Exam:  BP 112/74   Pulse 82   Temp  97.7 F (36.5 C)   Wt 128 lb 3.2 oz (58.2 kg)   SpO2 99%   BMI 25.04 kg/m   General Appearance: Well nourished, well dressed, in no acute distress  Eyes: PERRLA, conjunctiva no swelling or erythema ENT/Mouth:  MMM, no lesions. Hearing normal.  Neck: Supple Respiratory: Respiratory effort normal, BS equal bilaterally without rales, rhonchi, wheezing or stridor.  Cardio: RRR with no MRGs. Brisk peripheral pulses without edema.  Abdomen: Active BS x 4 quadrants, soft, non-distended. Mild tenderness to epigastric area without rebound. No guarding; no repound, no identified hernias or palpable masses.  Lymphatics: Non tender without lymphadenopathy.  Musculoskeletal: No obvious deformity, normal gait.  Skin: Warm, dry without rashes, lesions, ecchymosis.  Neuro:  Normal muscle tone, no cerebellar symptoms.  Psych: Awake and oriented X 3, normal affect, Insight and  Judgment appropriate.     Izora Ribas, NP 12:07 PM [facility]

## 2019-12-12 ENCOUNTER — Other Ambulatory Visit: Payer: Self-pay

## 2019-12-12 ENCOUNTER — Encounter: Payer: Self-pay | Admitting: Adult Health

## 2019-12-12 ENCOUNTER — Ambulatory Visit: Payer: Medicare PPO | Admitting: Adult Health

## 2019-12-12 ENCOUNTER — Other Ambulatory Visit: Payer: Self-pay | Admitting: Adult Health

## 2019-12-12 VITALS — BP 112/74 | HR 82 | Temp 97.7°F | Wt 128.2 lb

## 2019-12-12 DIAGNOSIS — R195 Other fecal abnormalities: Secondary | ICD-10-CM | POA: Diagnosis not present

## 2019-12-12 DIAGNOSIS — R10816 Epigastric abdominal tenderness: Secondary | ICD-10-CM | POA: Diagnosis not present

## 2019-12-12 DIAGNOSIS — F419 Anxiety disorder, unspecified: Secondary | ICD-10-CM

## 2019-12-12 DIAGNOSIS — Z79899 Other long term (current) drug therapy: Secondary | ICD-10-CM | POA: Diagnosis not present

## 2019-12-12 DIAGNOSIS — R140 Abdominal distension (gaseous): Secondary | ICD-10-CM | POA: Diagnosis not present

## 2019-12-12 DIAGNOSIS — K2100 Gastro-esophageal reflux disease with esophagitis, without bleeding: Secondary | ICD-10-CM | POA: Diagnosis not present

## 2019-12-12 DIAGNOSIS — R142 Eructation: Secondary | ICD-10-CM | POA: Diagnosis not present

## 2019-12-12 DIAGNOSIS — E039 Hypothyroidism, unspecified: Secondary | ICD-10-CM

## 2019-12-12 MED ORDER — OMEPRAZOLE 20 MG PO CPDR: DELAYED_RELEASE_CAPSULE | ORAL | 1 refills | Status: DC

## 2019-12-12 MED ORDER — ALPRAZOLAM 1 MG PO TABS: ORAL_TABLET | ORAL | 0 refills | Status: DC

## 2019-12-12 NOTE — Patient Instructions (Addendum)
Start taking omeprazole - 20 mg daily 30-60 min before bedtime or breakfast  Add probiotic  Standard Process brand GI Stability   Or "Align" probiotic       Abdominal Bloating When you have abdominal bloating, your abdomen may feel full, tight, or painful. It may also look bigger than normal or swollen (distended). Common causes of abdominal bloating include:  Swallowing air.  Constipation.  Problems digesting food.  Eating too much.  Irritable bowel syndrome. This is a condition that affects the large intestine.  Lactose intolerance. This is an inability to digest lactose, a natural sugar in dairy products.  Celiac disease. This is a condition that affects the ability to digest gluten, a protein found in some grains.  Gastroparesis. This is a condition that slows down the movement of food in the stomach and small intestine. It is more common in people with diabetes mellitus.  Gastroesophageal reflux disease (GERD). This is a digestive condition that makes stomach acid flow back into the esophagus.  Urinary retention. This means that the body is holding onto urine, and the bladder cannot be emptied all the way. Follow these instructions at home: Eating and drinking  Avoid eating too much.  Try not to swallow air while talking or eating.  Avoid eating while lying down.  Avoid these foods and drinks: ? Foods that cause gas, such as broccoli, cabbage, cauliflower, and baked beans. ? Carbonated drinks. ? Hard candy. ? Chewing gum. Medicines  Take over-the-counter and prescription medicines only as told by your health care provider.  Take probiotic medicines. These medicines contain live bacteria or yeasts that can help digestion.  Take coated peppermint oil capsules. Activity  Try to exercise regularly. Exercise may help to relieve bloating that is caused by gas and relieve constipation. General instructions  Keep all follow-up visits as told by your health  care provider. This is important. Contact a health care provider if:  You have nausea and vomiting.  You have diarrhea.  You have abdominal pain.  You have unusual weight loss or weight gain.  You have severe pain, and medicines do not help. Get help right away if:  You have severe chest pain.  You have trouble breathing.  You have shortness of breath.  You have trouble urinating.  You have darker urine than normal.  You have blood in your stools or have dark, tarry stools. Summary  Abdominal bloating means that the abdomen is swollen.  Common causes of abdominal bloating are swallowing air, constipation, and problems digesting food.  Avoid eating too much and avoid swallowing air.  Avoid foods that cause gas, carbonated drinks, hard candy, and chewing gum. This information is not intended to replace advice given to you by your health care provider. Make sure you discuss any questions you have with your health care provider. Document Revised: 12/24/2018 Document Reviewed: 10/07/2016 Elsevier Patient Education  2020 [place] stands for fermentable oligo-, di-, mono-saccharides and polyols (1). These are the scientific terms used to classify groups of carbs that are notorious for triggering digestive symptoms like bloating, gas and stomach pain.   FODMAPs are found in a wide range of foods in varying amounts. Some foods contain just one type, while others contain several.  The main dietary sources of the four groups of FODMAPs include:  Oligosaccharides: Wheat, rye, legumes and various fruits and vegetables, such as garlic and onions.  Disaccharides: Milk, yogurt and soft cheese.  Lactose is the main carb.  Monosaccharides: Various fruit including figs and mangoes, and sweeteners such as honey and agave nectar. Fructose is the main carb.  Polyols: Certain fruits and vegetables including blackberries and lychee, as well as some low-calorie  sweeteners like those in sugar-free gum.   Keep a food diary. This will help you identify foods that cause symptoms. Write down: ? What you eat and when. ? What symptoms you have. ? When symptoms occur in relation to your meals.  Avoid foods that cause symptoms. Talk with your dietitian about other ways to get the same nutrients that are in these foods.  Eat your meals slowly, in a relaxed setting.  Aim to eat 5-6 small meals per day. Do not skip meals.  Drink enough fluids to keep your urine clear or pale yellow.  Ask your health care provider if you should take an over-the-counter probiotic during flare-ups to help restore healthy gut bacteria.  If you have cramping or diarrhea, try making your meals low in fat and high in carbohydrates. Examples of carbohydrates are pasta, rice, whole grain breads and cereals, fruits, and vegetables.  If dairy products cause your symptoms to flare up, try eating less of them. You might be able to handle yogurt better than other dairy products because it contains bacteria that help with digestion.

## 2019-12-13 ENCOUNTER — Other Ambulatory Visit: Payer: Self-pay | Admitting: Adult Health

## 2019-12-13 ENCOUNTER — Other Ambulatory Visit: Payer: Self-pay

## 2019-12-13 LAB — CBC WITH DIFFERENTIAL/PLATELET
Absolute Monocytes: 418 cells/uL (ref 200–950)
Basophils Absolute: 52 cells/uL (ref 0–200)
Basophils Relative: 1.1 %
Eosinophils Absolute: 141 cells/uL (ref 15–500)
Eosinophils Relative: 3 %
HCT: 40.5 % (ref 35.0–45.0)
Hemoglobin: 13.6 g/dL (ref 11.7–15.5)
Lymphs Abs: 1716 cells/uL (ref 850–3900)
MCH: 30.5 pg (ref 27.0–33.0)
MCHC: 33.6 g/dL (ref 32.0–36.0)
MCV: 90.8 fL (ref 80.0–100.0)
MPV: 8.4 fL (ref 7.5–12.5)
Monocytes Relative: 8.9 %
Neutro Abs: 2374 cells/uL (ref 1500–7800)
Neutrophils Relative %: 50.5 %
Platelets: 372 10*3/uL (ref 140–400)
RBC: 4.46 10*6/uL (ref 3.80–5.10)
RDW: 12.2 % (ref 11.0–15.0)
Total Lymphocyte: 36.5 %
WBC: 4.7 10*3/uL (ref 3.8–10.8)

## 2019-12-13 LAB — COMPLETE METABOLIC PANEL WITH GFR
AG Ratio: 2 (calc) (ref 1.0–2.5)
ALT: 16 U/L (ref 6–29)
AST: 23 U/L (ref 10–35)
Albumin: 4.6 g/dL (ref 3.6–5.1)
Alkaline phosphatase (APISO): 56 U/L (ref 37–153)
BUN: 13 mg/dL (ref 7–25)
CO2: 25 mmol/L (ref 20–32)
Calcium: 9.6 mg/dL (ref 8.6–10.4)
Chloride: 106 mmol/L (ref 98–110)
Creat: 0.7 mg/dL (ref 0.50–0.99)
GFR, Est African American: 103 mL/min/{1.73_m2} (ref >=60)
GFR, Est Non African American: 89 mL/min/{1.73_m2} (ref >=60)
Globulin: 2.3 g/dL (calc) (ref 1.9–3.7)
Glucose, Bld: 85 mg/dL (ref 65–99)
Potassium: 4.4 mmol/L (ref 3.5–5.3)
Sodium: 137 mmol/L (ref 135–146)
Total Bilirubin: 0.5 mg/dL (ref 0.2–1.2)
Total Protein: 6.9 g/dL (ref 6.1–8.1)

## 2019-12-13 LAB — H. PYLORI BREATH TEST: H. pylori Breath Test: NOT DETECTED

## 2019-12-13 LAB — TSH: TSH: 0.24 mIU/L — ABNORMAL LOW (ref 0.40–4.50)

## 2019-12-13 LAB — CELIAC DISEASE COMPREHENSIVE PANEL WITH REFLEXES
(tTG) Ab, IgA: 1 U/mL
Immunoglobulin A: 215 mg/dL (ref 70–320)

## 2019-12-13 LAB — MAGNESIUM: Magnesium: 2.1 mg/dL (ref 1.5–2.5)

## 2019-12-13 MED ORDER — SYNTHROID 75 MCG PO TABS: ORAL_TABLET | ORAL | 2 refills | Status: DC

## 2019-12-13 MED ORDER — LEVOTHYROXINE SODIUM 88 MCG PO TABS: ORAL_TABLET | ORAL | 11 refills | Status: DC

## 2019-12-26 ENCOUNTER — Ambulatory Visit: Payer: Medicare PPO | Admitting: Adult Health

## 2019-12-26 NOTE — Progress Notes (Deleted)
Assessment and Plan:  Monique Thomas was seen today for abdominal bloating.  Diagnoses and all orders for this visit:   Epigastric tenderness Bloating/belching Gassiness Episodic loose stools  Persistent symptoms as per HPI ongoing following acute abdominal pain episode on 09/25/2019 (unremarkable workup incluging CBC, CMP, lipase/amylase, UA, Ct abd/pelvis without contrast at that time), has since had negative Celiac, h. Pylori, TSH.   Hx of GERD, known small hiatal hernia, no improvement with carafate, PPI/H2i *** *** Possible small bowel bacteria overgrowth - patient resistant to GI evaluation, tried presumptive tx with cipro/flagyl ***  Add probiotic -  If etiology remains unclear recommended GI evaluation; she is due for colon cancer screening in August of this year *** She is very resistant to returning to GI at this time, consider presumptive abx treatment for small bowel bacteria overgrowth if unexplained and not improving with PPI/probiotic  ***  Further disposition pending results of labs. Discussed med's effects and SE's.   Over 30 minutes of exam, counseling, chart review, and critical decision making was performed.   Future Appointments  Date Time Provider [place]  01/01/2020 10:15 AM Liane Comber, NP [facility] None  03/04/2020 10:00 AM Liane Comber, NP [facility] None  06/24/2020  3:00 PM Vicie Mutters, PA-C [facility] None    ------------------------------------------------------------------------------------------------------------------   HPI There were no vitals taken for this visit.  69 y.o.female hx of GERD, renal calculi, chronic pain syndrome (lumbar pain - follows with pain management getting injections, on gabapentin) represents for follow up of 3-4 months of persistent stomach rumbling/gurling after meals, increase belching/burning after meals, and daily AM diarrhea (1 episode 30 min after waking up, never after this, loose stool, not watery,  orange/brown color, no atypical odor). She does endorse increased gassiness. Notably she was seen in 09/25/2019 for upper abdominal pain, had workup including CBC, CMP, amylase, lipase, UA/culture which was unremarkable. CT abdomen/pelvis w/o contrast was also pursued which showed small hatal hernia, non-inflamed diverticulosis of the distal colon, 2 non-obstructing stones in R kidney, no acute changes. She reports current symptoms are persistent since that time though no further pain. H. Pylori breath test and celiac's, TSH were normal on 12/12/2019. Denies ETOH or NSAID use. Hx of GERD, didn't note improvement with carafate, was prescribed omeprazole but hadn't started ***  She reports upper abdominal symptoms start 5-10 after starting to eat, cannot correlate to any particular type of foods, has been trying to eat bland foods, mashed potatoes, macaroni, etc but has also had similar sx with hamburger, fruits/veggies, etc. There are no known alleviating factors. Has been taking 2 tabs imodium at night to try to relieve AM loose stools without notable improvement. She was prescribed levsin SL to try, has been taking in AM without notable improvement in sx.   We discussed possible small intestinal bacteria overgrowth, discussed referral back to GI for   BMI is There is no height or weight on file to calculate BMI., she reports good appetite, hasn't meant to lose weight. Does report eating slightly less, 3/4th of what she would normally eat due to feeling bad after eating.  Wt Readings from Last 3 Encounters:  12/12/19 128 lb 3.2 oz (58.2 kg)  09/25/19 134 lb (60.8 kg)  06/24/19 134 lb (60.8 kg)       Past Medical History:  Diagnosis Date  . Anemia   . Anxiety   . Arthritis   . Back pain, chronic   . Cancer ([place])  2 weeks ago    skin  cancer  left anterior wrist   and right side of  face     . Depression   . Diverticulosis 09/25/2019  . GERD (gastroesophageal reflux disease)   . Hyperlipidemia    . Hypothyroidism   . Kidney stone    2015  . Thyroid disease   . Umbilical hernia   . Unspecified vitamin D deficiency      Allergies  Allergen Reactions  . Celebrex [Celecoxib] Other (See Comments)    "panic attack"  . Other Other (See Comments)    Skin bruises very easily and peels back  . Topamax [Topiramate] Other (See Comments)    MEMORY  . Entex Lq [Phenylephrine-Guaifenesin] Rash  . Fiorinal [Butalbital-Aspirin-Caffeine] Rash    Current Outpatient Medications on File Prior to Visit  Medication Sig  . ALPRAZolam (XANAX) 1 MG tablet Take 1/2 to 1 tablet  At Bedtime & please try to limit to 5 days /week to avoid addiction  . Cholecalciferol (VITAMIN D3 PO) Take 5,000 Units by mouth 2 (two) times a day.   . Cyanocobalamin (B-12 PO) Take 1,000 mcg by mouth daily.   . cyclobenzaprine (FLEXERIL) 5 MG tablet Take 5 mg by mouth at bedtime.  Marland Kitchen estradiol (ESTRACE) 1 MG tablet Take 1 mg by mouth daily.  Marland Kitchen levothyroxine (SYNTHROID) 88 MCG tablet Take 1/2 tablet one day a week and 1 tablet all other days.  . Loperamide HCl (IMODIUM PO) Take by mouth.  . Magnesium 250 MG TABS Take 500 mg by mouth 2 (two) times a day.   Marland Kitchen omeprazole (PRILOSEC) 20 MG capsule Take 1 capsule Daily for Indigestion & Heartburn   No current facility-administered medications on file prior to visit.    ROS: Review of Systems  Constitutional: Positive for weight loss. Negative for chills, diaphoresis, fever and malaise/fatigue.  HENT: Negative.   Eyes: Negative.   Respiratory: Negative.   Cardiovascular: Negative.   Gastrointestinal: Positive for diarrhea (single episode in AM daily ). Negative for abdominal pain, blood in stool, constipation, heartburn, melena, nausea and vomiting.  Genitourinary: Negative.   Musculoskeletal: Positive for back pain (chronic, getting injections). Negative for joint pain and myalgias.  Skin: Negative for rash.  Neurological: Negative.  Negative for dizziness, tingling,  sensory change and headaches.  Endo/Heme/Allergies: Negative for polydipsia.  Psychiatric/Behavioral: Negative for depression and substance abuse. The patient is not nervous/anxious and does not have insomnia.      Physical Exam:  There were no vitals taken for this visit.  General Appearance: Well nourished, well dressed, in no acute distress  Eyes: PERRLA, conjunctiva no swelling or erythema ENT/Mouth:  MMM, no lesions. Hearing normal.  Neck: Supple Respiratory: Respiratory effort normal, BS equal bilaterally without rales, rhonchi, wheezing or stridor.  Cardio: RRR with no MRGs. Brisk peripheral pulses without edema.  Abdomen: Active BS x 4 quadrants, soft, non-distended. Mild tenderness to epigastric area without rebound. No guarding; no repound, no identified hernias or palpable masses.  Lymphatics: Non tender without lymphadenopathy.  Musculoskeletal: No obvious deformity, normal gait.  Skin: Warm, dry without rashes, lesions, ecchymosis.  Neuro:  Normal muscle tone, no cerebellar symptoms.  Psych: Awake and oriented X 3, normal affect, Insight and Judgment appropriate.     Izora Ribas, NP 3:22 PM [facility]

## 2020-01-01 ENCOUNTER — Ambulatory Visit: Payer: Medicare PPO | Admitting: Adult Health

## 2020-01-01 ENCOUNTER — Ambulatory Visit: Payer: Medicare PPO

## 2020-01-08 NOTE — Progress Notes (Signed)
MEDICARE ANNUAL WELLNESS VISIT AND FOLLOW UP  Assessment:   Diagnoses and all orders for this visit:   Encounter for Medicare annual wellness exam  Gastroesophageal reflux disease with esophagitis Denies sx at this time off of meds Discussed diet, avoiding triggers and other lifestyle changes  Hypothyroidism, unspecified type continue medications the same pending lab results reminded to take on an empty stomach 30-19mins before food.  check TSH level  Vitamin D deficiency Continue supplementation Check vitamin D level  S/P lumbar spinal fusion Continue follow up   Other abnormal glucose Recent A1Cs at goal Discussed diet/exercise, weight management  Defer A1C to CPE; check CMP at routine OV  Medication management Defer, just had checked  Mixed hyperlipidemia At goal by lifestyle modification Continue low cholesterol diet and exercise.  Defer lipid panel.   Chronic pain syndrome Continue follow up  Chronic back pain, unspecified back location, unspecified back pain laterality Continue follow up  Anxiety Well managed by current regimen; reminded to avoid daily use of benzo to limit tolerance/addiction Stress management techniques discussed, increase water, good sleep hygiene discussed, increase exercise, and increase veggies.   Osteopenia - get dexa 2021, patient pref to defer this year, continue Vit D and Ca, reviewed weight bearing exercises  Restless legs Excess sedation with low dose gabapentin, requip, declines meds at this time; taking xanax at night to assist with sleep, discussed possibly switching to valium or klonopin, declines at this time Continue with lifestyle, walking, stretching - labs normal at recent check   Bloating/diarrhea Resolved;    Over 40 minutes of exam, counseling, chart review and critical decision making was performed Future Appointments  Date Time Provider [place]  03/04/2020 10:00 AM Liane Comber, NP  [facility] None  06/24/2020  3:00 PM Vicie Mutters, PA-C [facility] None     Plan:   During the course of the visit the patient was educated and counseled about appropriate screening and preventive services including:    Pneumococcal vaccine   Prevnar 13  Influenza vaccine  Td vaccine  Screening electrocardiogram  Bone densitometry screening  Colorectal cancer screening  Diabetes screening  Glaucoma screening  Nutrition counseling   Advanced directives: requested   Subjective:  Monique Thomas is a 69 y.o. female who presents for Medicare Annual Wellness Visit and 1 month follow up on abdominal bloating and thyroid medication management.  3 months of persistent belching and gassiness, bloating, AM diarrhea was initiated on omeprazole last visit and synthroid was adjusted. She reports stress at home improved, sold house and now off of PPI and symptoms are resolved.    She is followed by [facility]. She is s/p hysterectomy, on estrace 1 mg via GYN.   She has chronic lower back pain, s/p fusion, getting injections via neurosurgery. Doing PT with ortho for knee, s/p TKA. She has ongoing concerns with RLS which are worse following her last knee surgery, improved with increased activity, PRN muscle relaxer, and declined further interventions.   she has a diagnosis of anxiety and is currently on xanax 1 mg, reports symptoms are well controlled on current regimen. she currently takes 1 tab at night to help with sleep/restless legs.   BMI is Body mass index is 25.78 kg/m., she has been working on diet and exercise. Wt Readings from Last 3 Encounters:  01/09/20 132 lb (59.9 kg)  12/12/19 128 lb 3.2 oz (58.2 kg)  09/25/19 134 lb (60.8 kg)   Today their BP is BP: 124/76  She does workout. She denies chest pain, shortness of breath, dizziness.   She is not on cholesterol medication and denies myalgias. Her cholesterol is at goal. The cholesterol last visit was:   Lab  Results  Component Value Date   CHOL 174 06/24/2019   HDL 79 06/24/2019   LDLCALC 78 06/24/2019   TRIG 86 06/24/2019   CHOLHDL 2.2 06/24/2019    She has been working on diet and exercise for glucose management, and denies foot ulcerations, increased appetite, nausea, paresthesia of the feet, polydipsia, polyuria, visual disturbances, vomiting and weight loss. Last A1C in the office was:  Lab Results  Component Value Date   HGBA1C 5.5 11/21/2018   She is on thyroid medication. Her medication was changed last visit, reduced from 88 mcg daily to 1/2 tab twice a week and whole tab other days.  Lab Results  Component Value Date   TSH 0.24 (L) 12/12/2019   Last GFR: Lab Results  Component Value Date   GFRNONAA 89 12/12/2019   Patient is on Vitamin D supplement  Lab Results  Component Value Date   VD25OH 41 06/24/2019        Medication Review: Current Outpatient Medications on File Prior to Visit  Medication Sig Dispense Refill  . ALPRAZolam (XANAX) 1 MG tablet Take 1/2 to 1 tablet  At Bedtime & please try to limit to 5 days /week to avoid addiction 90 tablet 0  . Cholecalciferol (VITAMIN D3 PO) Take 5,000 Units by mouth 2 (two) times a day.     . Cyanocobalamin (B-12 PO) Take 1,000 mcg by mouth daily.     . cyclobenzaprine (FLEXERIL) 5 MG tablet Take 5 mg by mouth at bedtime.    Marland Kitchen estradiol (ESTRACE) 1 MG tablet Take 1 mg by mouth daily.    Marland Kitchen levothyroxine (SYNTHROID) 88 MCG tablet Take 1/2 tablet one day a week and 1 tablet all other days. 30 tablet 11  . Loperamide HCl (IMODIUM PO) Take by mouth.    . Magnesium 250 MG TABS Take 500 mg by mouth 2 (two) times a day.     Marland Kitchen omeprazole (PRILOSEC) 20 MG capsule Take 1 capsule Daily for Indigestion & Heartburn 90 capsule 1   No current facility-administered medications on file prior to visit.    Allergies  Allergen Reactions  . Celebrex [Celecoxib] Other (See Comments)    "panic attack"  . Other Other (See Comments)    Skin  bruises very easily and peels back  . Topamax [Topiramate] Other (See Comments)    MEMORY  . Entex Lq [Phenylephrine-Guaifenesin] Rash  . Fiorinal [Butalbital-Aspirin-Caffeine] Rash    Current Problems (verified) Patient Active Problem List   Diagnosis Date Noted  . Aortic atherosclerosis ([place]) 09/25/2019  . Diverticulosis 09/25/2019  . Osteopenia 02/26/2019  . Menopausal symptoms 05/07/2018  . Restless legs 02/06/2018  . Chronic pain syndrome 03/27/2015  . Medication management 01/15/2014  . Other abnormal glucose 01/15/2014  . Back pain, chronic   . Hyperlipidemia   . Hypothyroidism   . GERD (gastroesophageal reflux disease)   . Anxiety   . Vitamin D deficiency     Screening Tests Immunization History  Administered Date(s) Administered  . Influenza Split 06/29/2015  . Influenza Whole 05/21/2013  . Influenza, High Dose Seasonal PF 05/30/2016, 06/13/2017, 06/18/2018, 06/24/2019  . Influenza,inj,quad, With Preservative 06/19/2017  . Influenza-Unspecified 06/09/2014, 06/20/2015  . PPD Test 09/23/2013, 10/01/2014, 02/26/2016  . Pneumococcal Conjugate-13 05/03/2017  . Pneumococcal Polysaccharide-23 09/23/2013  . Pneumococcal-Unspecified  06/19/2017  . Td 09/19/2005  . Tdap 10/07/2015  . Zoster 03/27/2014   Preventative care: Last colonoscopy: 2008  Cologuard: 04/2017 neg due 04/2021 Last mammogram: 08/2019 Last pap smear/pelvic exam: 09/2017 GYN normal, [place]  DEXA: 01/2017 L fem -1.2 postpone to next year, ordered to schedule with next mammogram   Prior vaccinations: TD or Tdap: 2017  Influenza: 06/2019 Pneumococcal: 2015 Prevnar13: 2018 Shingles/Zostavax: 2015, will check with insurance about shingrix coverage  Covid 19: declines   Names of Other Physician/Practitioners you currently use: 1. [facility] here for primary care 2. Dr. Einar Gip, eye doctor, last visit 2021, s/p cataracts 3. Dr. Toy Cookey, dentist, q6 months,  last visit 2021, q20m  Patient Care Team: [name], MD as PCP - General (Internal Medicine) Darlis Loan, MD as Referring Physician (Gastroenterology) Vania Rea, MD as Consulting Physician (Obstetrics and Gynecology) Carolan Clines, MD (Inactive) as Consulting Physician (Urology) Zara Council as Physician Assistant (Orthopedic Surgery) Lavonna Monarch, MD as Consulting Physician (Dermatology) Gatha Mayer, MD as Consulting Physician (Gastroenterology) Webb Laws, [place] as Referring Physician (Optometry)  SURGICAL HISTORY She  has a past surgical history that includes Abdominal hysterectomy; Cesarean section; Hernia repair; Appendectomy; Tonsillectomy and adenoidectomy; Spine surgery; Laparoscopic lysis intestinal adhesions; Maximum access (mas)posterior lumbar interbody fusion (plif) 1 level (N/A, 04/16/2015); Anterior lat lumbar fusion (N/A, 04/07/2017); and Total knee arthroplasty (Left, 07/17/2018). FAMILY HISTORY Her family history includes Asthma in her sister; Cancer in her maternal grandmother; Heart disease in her mother; Hyperlipidemia in her mother; Thyroid disease in her mother. SOCIAL HISTORY She  reports that she has never smoked. She has never used smokeless tobacco. She reports that she does not drink alcohol or use drugs.   MEDICARE WELLNESS OBJECTIVES: Physical activity:   Cardiac risk factors:   Depression/mood screen:   Depression screen [facility] 2/9 02/27/2019  Decreased Interest 0  Down, Depressed, Hopeless 0  PHQ - 2 Score 0    ADLs:  In your present state of health, do you have any difficulty performing the following activities: 02/27/2019  Hearing? N  Vision? N  Difficulty concentrating or making decisions? N  Walking or climbing stairs? N  Dressing or bathing? N  Doing errands, shopping? N  Some recent data might be hidden     Cognitive Testing  Alert? Yes  Normal Appearance?Yes  Oriented to person? Yes  Place? Yes   Time?  Yes  Recall of three objects?  Yes  Can perform simple calculations? Yes  Displays appropriate judgment?Yes  Can read the correct time from a watch face?Yes  EOL planning: Does Patient Have a Medical Advance Directive?: Yes, No Does patient want to make changes to medical advance directive?: No - Patient declined  Review of Systems  Constitutional: Negative for malaise/fatigue and weight loss.  HENT: Negative for hearing loss and tinnitus.   Eyes: Negative for blurred vision and double vision.  Respiratory: Negative for cough, sputum production, shortness of breath and wheezing.   Cardiovascular: Negative for chest pain, palpitations, orthopnea, claudication, leg swelling and PND.  Gastrointestinal: Negative for abdominal pain, blood in stool, constipation, diarrhea, heartburn, melena, nausea and vomiting.  Genitourinary: Negative.   Musculoskeletal: Negative for falls, joint pain and myalgias.  Skin: Negative for rash.  Neurological: Negative for dizziness, tingling, sensory change, weakness and headaches.  Endo/Heme/Allergies: Negative for polydipsia.  Psychiatric/Behavioral: Negative for depression, memory loss, substance abuse and suicidal ideas. The patient has insomnia. The patient is not nervous/anxious.   All  other systems reviewed and are negative.    Objective:     Today's Vitals   01/09/20 0936  BP: 124/76  Pulse: 77  Temp: (!) 97.3 F (36.3 C)  SpO2: 99%  Weight: 132 lb (59.9 kg)  PainSc: 8   PainLoc: Knee   Body mass index is 25.78 kg/m.  General appearance: alert, no distress, WD/WN, female HEENT: normocephalic, sclerae anicteric, TMs pearly, nares patent, no discharge or erythema, pharynx normal Oral cavity: MMM, no lesions Neck: supple, no lymphadenopathy, no thyromegaly, no masses Heart: RRR, normal S1, S2, no murmurs Lungs: CTA bilaterally, no wheezes, rhonchi, or rales Abdomen: +bs, soft, non tender, non distended, no masses, no hepatomegaly, no  splenomegaly Musculoskeletal: nontender, no swelling, no obvious deformity Extremities: no edema, no cyanosis, no clubbing Pulses: 2+ symmetric, upper and lower extremities, normal cap refill Neurological: alert, oriented x 3, CN2-12 intact, strength normal upper extremities and lower extremities, sensation normal throughout, DTRs 2+ throughout, no cerebellar signs, gait normal Psychiatric: normal affect, behavior normal, pleasant   Medicare Attestation I have personally reviewed: The patient's medical and social history Their use of alcohol, tobacco or illicit drugs Their current medications and supplements The patient's functional ability including ADLs,fall risks, home safety risks, cognitive, and hearing and visual impairment Diet and physical activities Evidence for depression or mood disorders  The patient's weight, height, BMI, and visual acuity have been recorded in the chart.  I have made referrals, counseling, and provided education to the patient based on review of the above and I have provided the patient with a written personalized care plan for preventive services.     Izora Ribas, NP   01/09/2020

## 2020-01-09 ENCOUNTER — Ambulatory Visit: Payer: Medicare PPO | Admitting: Adult Health

## 2020-01-09 ENCOUNTER — Encounter: Payer: Self-pay | Admitting: Adult Health

## 2020-01-09 ENCOUNTER — Other Ambulatory Visit: Payer: Self-pay

## 2020-01-09 VITALS — BP 124/76 | HR 77 | Temp 97.3°F | Wt 132.0 lb

## 2020-01-09 DIAGNOSIS — Z0001 Encounter for general adult medical examination with abnormal findings: Secondary | ICD-10-CM | POA: Diagnosis not present

## 2020-01-09 DIAGNOSIS — G2581 Restless legs syndrome: Secondary | ICD-10-CM

## 2020-01-09 DIAGNOSIS — M5442 Lumbago with sciatica, left side: Secondary | ICD-10-CM

## 2020-01-09 DIAGNOSIS — K5790 Diverticulosis of intestine, part unspecified, without perforation or abscess without bleeding: Secondary | ICD-10-CM | POA: Diagnosis not present

## 2020-01-09 DIAGNOSIS — N951 Menopausal and female climacteric states: Secondary | ICD-10-CM | POA: Diagnosis not present

## 2020-01-09 DIAGNOSIS — E782 Mixed hyperlipidemia: Secondary | ICD-10-CM

## 2020-01-09 DIAGNOSIS — G8929 Other chronic pain: Secondary | ICD-10-CM

## 2020-01-09 DIAGNOSIS — R6889 Other general symptoms and signs: Secondary | ICD-10-CM

## 2020-01-09 DIAGNOSIS — K2100 Gastro-esophageal reflux disease with esophagitis, without bleeding: Secondary | ICD-10-CM | POA: Diagnosis not present

## 2020-01-09 DIAGNOSIS — I700 Atherosclerosis of aorta: Secondary | ICD-10-CM

## 2020-01-09 DIAGNOSIS — Z79899 Other long term (current) drug therapy: Secondary | ICD-10-CM | POA: Diagnosis not present

## 2020-01-09 DIAGNOSIS — E559 Vitamin D deficiency, unspecified: Secondary | ICD-10-CM | POA: Diagnosis not present

## 2020-01-09 DIAGNOSIS — E039 Hypothyroidism, unspecified: Secondary | ICD-10-CM

## 2020-01-09 DIAGNOSIS — F419 Anxiety disorder, unspecified: Secondary | ICD-10-CM

## 2020-01-09 DIAGNOSIS — R7309 Other abnormal glucose: Secondary | ICD-10-CM | POA: Diagnosis not present

## 2020-01-09 DIAGNOSIS — G894 Chronic pain syndrome: Secondary | ICD-10-CM

## 2020-01-09 DIAGNOSIS — M85852 Other specified disorders of bone density and structure, left thigh: Secondary | ICD-10-CM | POA: Diagnosis not present

## 2020-01-09 DIAGNOSIS — Z0000 Encounter for general adult medical examination without abnormal findings: Secondary | ICD-10-CM

## 2020-01-09 NOTE — Patient Instructions (Addendum)
Monique Thomas , Thank you for taking time to come for your Medicare Wellness Visit. I appreciate your ongoing commitment to your health goals. Please review the following plan we discussed and let me know if I can assist you in the future.   These are the goals we discussed: Goals    . Exercise 150 min/wk Moderate Activity       This is a list of the screening recommended for you and due dates:  Health Maintenance  Topic Date Due  . COVID-19 Vaccine (1) Never done  . Flu Shot  04/19/2020  . Cologuard (Stool DNA test)  05/12/2020  . Mammogram  08/28/2020  . Tetanus Vaccine  10/06/2025  . DEXA scan (bone density measurement)  Completed  .  Hepatitis C: One time screening is recommended by Center for Disease Control  (CDC) for  adults born from [birth year] through 1965.   Completed  . Pneumonia vaccines  Discontinued    Can get shingrix vaccine at [facility] or [facility] - check with insurance about covered    Schedule bone density exam with next mammogram - order is in system   [facility]  7 a.m.-6:30 p.m., Monday 7 a.m.-5 p.m., Tuesday-Friday Schedule an appointment by calling [phone].      Zoster Vaccine, Recombinant injection What is this medicine? ZOSTER VACCINE (ZOS ter vak SEEN) is used to prevent shingles in adults 69 years old and over. This vaccine is not used to treat shingles or nerve pain from shingles. This medicine may be used for other purposes; ask your health care provider or pharmacist if you have questions. COMMON BRAND NAME(S): [facility] What should I tell my health care provider before I take this medicine? They need to know if you have any of these conditions:  blood disorders or disease  cancer like leukemia or lymphoma  immune system problems or therapy  an unusual or allergic reaction to vaccines, other medications, foods, dyes, or preservatives  pregnant or trying to get pregnant  breast-feeding How should I use this  medicine? This vaccine is for injection in a muscle. It is given by a health care professional. Talk to your pediatrician regarding the use of this medicine in children. This medicine is not approved for use in children. Overdosage: If you think you have taken too much of this medicine contact a poison control center or emergency room at once. NOTE: This medicine is only for you. Do not share this medicine with others. What if I miss a dose? Keep appointments for follow-up (booster) doses as directed. It is important not to miss your dose. Call your doctor or health care professional if you are unable to keep an appointment. What may interact with this medicine?  medicines that suppress your immune system  medicines to treat cancer  steroid medicines like prednisone or cortisone This list may not describe all possible interactions. Give your health care provider a list of all the medicines, herbs, non-prescription drugs, or dietary supplements you use. Also tell them if you smoke, drink alcohol, or use illegal drugs. Some items may interact with your medicine. What should I watch for while using this medicine? Visit your doctor for regular check ups. This vaccine, like all vaccines, may not fully protect everyone. What side effects may I notice from receiving this medicine? Side effects that you should report to your doctor or health care professional as soon as possible:  allergic reactions like skin rash, itching or hives,  swelling of the face, lips, or tongue  breathing problems Side effects that usually do not require medical attention (report these to your doctor or health care professional if they continue or are bothersome):  chills  headache  fever  nausea, vomiting  redness, warmth, pain, swelling or itching at site where injected  tiredness This list may not describe all possible side effects. Call your doctor for medical advice about side effects. You may report side  effects to FDA at 1-800-FDA-1088. Where should I keep my medicine? This vaccine is only given in a clinic, pharmacy, doctor's office, or other health care setting and will not be stored at home. NOTE: This sheet is a summary. It may not cover all possible information. If you have questions about this medicine, talk to your doctor, pharmacist, or health care provider.  2020 Elsevier/Gold Standard (2017-04-17 13:20:30)

## 2020-01-10 LAB — TSH: TSH: 0.97 mIU/L (ref 0.40–4.50)

## 2020-01-30 DIAGNOSIS — M5416 Radiculopathy, lumbar region: Secondary | ICD-10-CM | POA: Diagnosis not present

## 2020-02-27 DIAGNOSIS — M5416 Radiculopathy, lumbar region: Secondary | ICD-10-CM | POA: Diagnosis not present

## 2020-02-27 DIAGNOSIS — Z981 Arthrodesis status: Secondary | ICD-10-CM | POA: Diagnosis not present

## 2020-02-27 DIAGNOSIS — N760 Acute vaginitis: Secondary | ICD-10-CM | POA: Diagnosis not present

## 2020-02-27 DIAGNOSIS — R32 Unspecified urinary incontinence: Secondary | ICD-10-CM | POA: Diagnosis not present

## 2020-02-27 DIAGNOSIS — K644 Residual hemorrhoidal skin tags: Secondary | ICD-10-CM | POA: Diagnosis not present

## 2020-02-27 DIAGNOSIS — F419 Anxiety disorder, unspecified: Secondary | ICD-10-CM | POA: Diagnosis not present

## 2020-03-04 ENCOUNTER — Ambulatory Visit: Payer: Medicare Other | Admitting: Adult Health

## 2020-04-04 NOTE — Progress Notes (Signed)
History of Present Illness:       This very nice 69 y.o.  MWF  presents for 3 month follow up with HTN, HLD, Hypothyroidism and Vitamin D Deficiency. She has hx/o GERD and has been off of her Omeprazole x 3 months w/o exacerbation of dyspeptic sx's.       In Oct 2019, Patient had Left TKR and has had "swelling" about the left knee & has been rechecked several times for same by Dr Alvan Dame. She c/o "twitching" of the muscles in both of her legs. She has prior dx of RLS. She's also been very emotional dealing with the death of her 52 yo  son on 2023/02/05 from a "Drug deal gone bad". Today she's also c/o dysuria.       Patient has been on Thyroid Replacement since the 1980's and last 2 TSH values have been normal.           Patient is followed expectantly since 2008 for Labile HTN & BP has been controlled at home. Today's BP is at goal - 120/68. Patient has had no complaints of any cardiac type chest pain, palpitations, dyspnea / orthopnea / PND, dizziness, claudication, or dependent edema.      Patient's Lipids are controlled with diet. Last Lipids were at goal:  Lab Results  Component Value Date   CHOL 174 06/24/2019   HDL 79 06/24/2019   LDLCALC 78 06/24/2019   TRIG 86 06/24/2019   CHOLHDL 2.2 06/24/2019    Also, the patient has been monitored expectantly for glucose intolerance and has had no symptoms of reactive hypoglycemia, diabetic polys, paresthesias or visual blurring.  Last A1c was Normal & at goal:  Lab Results  Component Value Date   HGBA1C 5.5 11/21/2018           Further, the patient also has history of Vitamin D Deficiency ("25" / 2008)  and supplements vitamin D without any suspected side-effects. Last vitamin D was low (goal 70-100):  Lab Results  Component Value Date   VD25OH 41 06/24/2019    Current Outpatient Medications on File Prior to Visit  Medication Sig  . ALPRAZolam (XANAX) 1 MG tablet Take 1/2 to 1 tablet  At Bedtime & please try to limit to 5 days  /week to avoid addiction  . Cholecalciferol (VITAMIN D3 PO) Take 5,000 Units by mouth 2 (two) times a day.   . Cyanocobalamin (B-12 PO) Take 1,000 mcg by mouth daily.   . cyclobenzaprine (FLEXERIL) 5 MG tablet Take 5 mg by mouth at bedtime.  Marland Kitchen estradiol (ESTRACE) 1 MG tablet Take 1 mg by mouth daily.  Marland Kitchen levothyroxine (SYNTHROID) 88 MCG tablet Take 1/2 tablet one day a week and 1 tablet all other days.  . Loperamide HCl (IMODIUM PO) Take by mouth.  . Magnesium 500 MG TABS Take 500 mg by mouth 4 (four) times daily.   No current facility-administered medications on file prior to visit.    Allergies  Allergen Reactions  . Celebrex [Celecoxib] Other (See Comments)    "panic attack"  . Other Other (See Comments)    Skin bruises very easily and peels back  . Topamax [Topiramate] Other (See Comments)    MEMORY  . Entex Lq [Phenylephrine-Guaifenesin] Rash  . Fiorinal [Butalbital-Aspirin-Caffeine] Rash    PMHx:   Past Medical History:  Diagnosis Date  . Anemia   . Anxiety   . Arthritis   . Back pain, chronic   .  Cancer ([place])  2 weeks ago    skin cancer  left anterior wrist   and right side of  face     . Depression   . Diverticulosis 09/25/2019  . GERD (gastroesophageal reflux disease)   . Hyperlipidemia   . Hypothyroidism   . Kidney stone    2015  . Thyroid disease   . Umbilical hernia   . Unspecified vitamin D deficiency     Immunization History  Administered Date(s) Administered  . Influenza Split 06/29/2015  . Influenza Whole 05/21/2013  . Influenza, High Dose Seasonal PF 05/30/2016, 06/13/2017, 06/18/2018, 06/24/2019  . Influenza,inj,quad, With Preservative 06/19/2017  . Influenza-Unspecified 06/09/2014, 06/20/2015  . PPD Test 09/23/2013, 10/01/2014, 02/26/2016  . Pneumococcal Conjugate-13 05/03/2017  . Pneumococcal Polysaccharide-23 09/23/2013  . Pneumococcal-Unspecified 06/19/2017  . Td 09/19/2005  . Tdap 10/07/2015  . Zoster 03/27/2014    Past Surgical  History:  Procedure Laterality Date  . ABDOMINAL HYSTERECTOMY    . ANTERIOR LAT LUMBAR FUSION N/A 04/07/2017   Procedure: LUMBAR ONE-TWO ANTERIOR LATERAL LUMBAR FUSION WITH LATERAL PLATE;  Surgeon: Eustace Moore, MD;  Location: [facility];  Service: Neurosurgery;  Laterality: N/A;  Right side approach  . APPENDECTOMY    . CESAREAN SECTION    . HERNIA REPAIR    . LAPAROSCOPIC LYSIS INTESTINAL ADHESIONS    . MAXIMUM ACCESS (MAS)POSTERIOR LUMBAR INTERBODY FUSION (PLIF) 1 LEVEL N/A 04/16/2015   Procedure: masPLIF  - L2-L3 ;  Surgeon: Eustace Moore, MD;  Location: [facility];  Service: Neurosurgery;  Laterality: N/A;  masPLIF  - L2-L3   . SPINE SURGERY     lumbar  . TONSILLECTOMY AND ADENOIDECTOMY    . TOTAL KNEE ARTHROPLASTY Left 07/17/2018   Procedure: LEFT TOTAL KNEE ARTHROPLASTY;  Surgeon: Paralee Cancel, MD;  Location: [facility];  Service: Orthopedics;  Laterality: Left;  70 mins    FHx:    Reviewed / unchanged  SHx:    Reviewed / unchanged   Systems Review:  Constitutional: Denies fever, chills, wt changes, headaches, insomnia, fatigue, night sweats, change in appetite. Eyes: Denies redness, blurred vision, diplopia, discharge, itchy, watery eyes.  ENT: Denies discharge, congestion, post nasal drip, epistaxis, sore throat, earache, hearing loss, dental pain, tinnitus, vertigo, sinus pain, snoring.  CV: Denies chest pain, palpitations, irregular heartbeat, syncope, dyspnea, diaphoresis, orthopnea, PND, claudication or edema. Respiratory: denies cough, dyspnea, DOE, pleurisy, hoarseness, laryngitis, wheezing.  Gastrointestinal: Denies dysphagia, odynophagia, heartburn, reflux, water brash, abdominal pain or cramps, nausea, vomiting, bloating, diarrhea, constipation, hematemesis, melena, hematochezia  or hemorrhoids. Genitourinary: Denies dysuria, frequency, urgency, nocturia, hesitancy, discharge, hematuria or flank pain. Musculoskeletal: Denies arthralgias, myalgias, stiffness, jt. swelling,  pain, limping or strain/sprain.  Skin: Denies pruritus, rash, hives, warts, acne, eczema or change in skin lesion(s). Neuro: No weakness, tremor, incoordination, spasms, paresthesia or pain. Psychiatric: Denies confusion, memory loss or sensory loss. Endo: Denies change in weight, skin or hair change.  Heme/Lymph: No excessive bleeding, bruising or enlarged lymph nodes.  Physical Exam  BP 120/68   Pulse 80   Temp 97.9 F (36.6 C)   Resp 16   Ht 5\' 1"  (1.549 m)   Wt 133 lb 6.4 oz (60.5 kg)   BMI 25.21 kg/m   Appears  well nourished, well groomed  and in no distress.  Eyes: PERRLA, EOMs, conjunctiva no swelling or erythema. Sinuses: No frontal/maxillary tenderness ENT/Mouth: EAC's clear, TM's nl w/o erythema, bulging. Nares clear w/o erythema, swelling, exudates. Oropharynx clear without erythema or  exudates. Oral hygiene is good. Tongue normal, non obstructing. Hearing intact.  Neck: Supple. Thyroid not palpable. Car 2+/2+ without bruits, nodes or JVD. Chest: Respirations nl with BS clear & equal w/o rales, rhonchi, wheezing or stridor.  Cor: Heart sounds normal w/ regular rate and rhythm without sig. murmurs, gallops, clicks or rubs. Peripheral pulses normal and equal  without edema.  Abdomen: Soft & bowel sounds normal. Non-tender w/o guarding, rebound, hernias, masses or organomegaly.  Lymphatics: Unremarkable.  Musculoskeletal: Full ROM all peripheral extremities, joint stability, 5/5 strength and normal gait.  Skin: Warm, dry without exposed rashes, lesions or ecchymosis apparent.  Neuro: Cranial nerves intact, reflexes equal bilaterally. Sensory-motor testing grossly intact. Tendon reflexes grossly intact.  Pysch: Alert & oriented x 3.  Insight and judgement nl & appropriate. No ideations.  Assessment and Plan:  1. Labile hypertension  - Continue medication, monitor blood pressure at home.  - Continue DASH diet.  Reminder to go to the ER if any CP,  SOB, nausea,  dizziness, severe HA, changes vision/speech.  - CBC with Differential/Platelet - COMPLETE METABOLIC PANEL WITH GFR - Magnesium - TSH  2. Hyperlipidemia, mixed  - Continue diet/meds, exercise,& lifestyle modifications.  - Continue monitor periodic cholesterol/liver & renal functions   - Lipid panel - TSH  3. Abnormal glucose  - Continue diet, exercise  - Lifestyle modifications.  - Monitor appropriate labs  - Hemoglobin A1c - Insulin, random  4. Vitamin D deficiency  - Continue supplementation.  - VITAMIN D 25 Hydroxy  5. Hypothyroidism, unspecified type  - TSH  6. Dysuria  - Urinalysis, Routine w reflex microscopic - Urine Culture  7. Iron deficiency  - Iron,Total/Total Iron Binding Cap  8. RLS (restless legs syndrome)  - Iron,Total/Total Iron Binding Cap  9. Medication management  - CBC with Differential/Platelet - COMPLETE METABOLIC PANEL WITH GFR - Magnesium - Lipid panel - TSH - Hemoglobin A1c - Insulin, random - VITAMIN D 25 Hydroxy       Discussed  regular exercise, BP monitoring, weight control to achieve/maintain BMI less than 25 and discussed med and SE's. Recommended labs to assess and monitor clinical status with further disposition pending results of labs.  I discussed the assessment and treatment plan with the patient. Patient declined offer to try SSRI like Lexapro for her anxiety & situational reactive Depression . Says she uses her xanax very sparingly. Has Gabapentin to retry for sleep.  The patient was provided an opportunity to ask questions and all were answered. The patient agreed with the plan and demonstrated an understanding of the instructions.  I provided over 30 minutes of exam, counseling, chart review and  complex critical decision making.   Kirtland Bouchard, MD

## 2020-04-04 NOTE — Patient Instructions (Signed)

## 2020-04-06 ENCOUNTER — Other Ambulatory Visit: Payer: Self-pay

## 2020-04-06 ENCOUNTER — Ambulatory Visit: Payer: Medicare PPO | Admitting: Internal Medicine

## 2020-04-06 ENCOUNTER — Encounter: Payer: Self-pay | Admitting: Internal Medicine

## 2020-04-06 VITALS — BP 120/68 | HR 80 | Temp 97.9°F | Resp 16 | Ht 61.0 in | Wt 133.4 lb

## 2020-04-06 DIAGNOSIS — Z79899 Other long term (current) drug therapy: Secondary | ICD-10-CM | POA: Diagnosis not present

## 2020-04-06 DIAGNOSIS — G2581 Restless legs syndrome: Secondary | ICD-10-CM

## 2020-04-06 DIAGNOSIS — R7309 Other abnormal glucose: Secondary | ICD-10-CM

## 2020-04-06 DIAGNOSIS — R0989 Other specified symptoms and signs involving the circulatory and respiratory systems: Secondary | ICD-10-CM | POA: Diagnosis not present

## 2020-04-06 DIAGNOSIS — E782 Mixed hyperlipidemia: Secondary | ICD-10-CM

## 2020-04-06 DIAGNOSIS — R300 Dysuria: Secondary | ICD-10-CM | POA: Diagnosis not present

## 2020-04-06 DIAGNOSIS — E039 Hypothyroidism, unspecified: Secondary | ICD-10-CM

## 2020-04-06 DIAGNOSIS — E611 Iron deficiency: Secondary | ICD-10-CM

## 2020-04-06 DIAGNOSIS — E559 Vitamin D deficiency, unspecified: Secondary | ICD-10-CM | POA: Diagnosis not present

## 2020-04-07 ENCOUNTER — Telehealth: Payer: Self-pay | Admitting: *Deleted

## 2020-04-07 ENCOUNTER — Ambulatory Visit: Payer: Medicare PPO | Admitting: Internal Medicine

## 2020-04-07 ENCOUNTER — Other Ambulatory Visit: Payer: Self-pay | Admitting: Internal Medicine

## 2020-04-07 DIAGNOSIS — M79662 Pain in left lower leg: Secondary | ICD-10-CM

## 2020-04-07 DIAGNOSIS — N3281 Overactive bladder: Secondary | ICD-10-CM

## 2020-04-07 DIAGNOSIS — M79661 Pain in right lower leg: Secondary | ICD-10-CM

## 2020-04-07 MED ORDER — OXYBUTYNIN CHLORIDE 5 MG PO TABS: ORAL_TABLET | ORAL | 0 refills | Status: DC

## 2020-04-07 MED ORDER — GABAPENTIN 100 MG PO CAPS: ORAL_CAPSULE | ORAL | 0 refills | Status: DC

## 2020-04-07 NOTE — Telephone Encounter (Signed)
Called patient regarding lab results. She states she tried Gabapentin 300 mg capsule last PM for her cramps and got some relief, but she was very dizzy when she got up. She also reported some urine incontinence at times. Dr Melford Aase will send in a lesser mg of Gabapentin and an RX for OAB.

## 2020-04-08 ENCOUNTER — Other Ambulatory Visit: Payer: Self-pay | Admitting: Internal Medicine

## 2020-04-08 LAB — CBC WITH DIFFERENTIAL/PLATELET
Absolute Monocytes: 336 cells/uL (ref 200–950)
Basophils Absolute: 49 cells/uL (ref 0–200)
Basophils Relative: 1.2 %
Eosinophils Absolute: 230 cells/uL (ref 15–500)
Eosinophils Relative: 5.6 %
HCT: 35.5 % (ref 35.0–45.0)
Hemoglobin: 12.2 g/dL (ref 11.7–15.5)
Lymphs Abs: 1558 cells/uL (ref 850–3900)
MCH: 30.6 pg (ref 27.0–33.0)
MCHC: 34.4 g/dL (ref 32.0–36.0)
MCV: 89 fL (ref 80.0–100.0)
MPV: 8.9 fL (ref 7.5–12.5)
Monocytes Relative: 8.2 %
Neutro Abs: 1927 cells/uL (ref 1500–7800)
Neutrophils Relative %: 47 %
Platelets: 336 10*3/uL (ref 140–400)
RBC: 3.99 10*6/uL (ref 3.80–5.10)
RDW: 12.6 % (ref 11.0–15.0)
Total Lymphocyte: 38 %
WBC: 4.1 10*3/uL (ref 3.8–10.8)

## 2020-04-08 LAB — HEMOGLOBIN A1C
Hgb A1c MFr Bld: 5.2 % of total Hgb (ref <5.7)
Mean Plasma Glucose: 103 (calc)
eAG (mmol/L): 5.7 (calc)

## 2020-04-08 LAB — URINALYSIS, ROUTINE W REFLEX MICROSCOPIC
Bilirubin Urine: NEGATIVE
Glucose, UA: NEGATIVE
Hgb urine dipstick: NEGATIVE
Ketones, ur: NEGATIVE
Leukocytes,Ua: NEGATIVE
Nitrite: NEGATIVE
Protein, ur: NEGATIVE
Specific Gravity, Urine: 1.01 (ref 1.001–1.03)
pH: 7.5 (ref 5.0–8.0)

## 2020-04-08 LAB — COMPLETE METABOLIC PANEL WITH GFR
AG Ratio: 2 (calc) (ref 1.0–2.5)
ALT: 12 U/L (ref 6–29)
AST: 16 U/L (ref 10–35)
Albumin: 4.2 g/dL (ref 3.6–5.1)
Alkaline phosphatase (APISO): 58 U/L (ref 37–153)
BUN: 11 mg/dL (ref 7–25)
CO2: 26 mmol/L (ref 20–32)
Calcium: 9.1 mg/dL (ref 8.6–10.4)
Chloride: 109 mmol/L (ref 98–110)
Creat: 0.57 mg/dL (ref 0.50–0.99)
GFR, Est African American: 110 mL/min/{1.73_m2} (ref >=60)
GFR, Est Non African American: 95 mL/min/{1.73_m2} (ref >=60)
Globulin: 2.1 g/dL (calc) (ref 1.9–3.7)
Glucose, Bld: 105 mg/dL — ABNORMAL HIGH (ref 65–99)
Potassium: 4.2 mmol/L (ref 3.5–5.3)
Sodium: 140 mmol/L (ref 135–146)
Total Bilirubin: 0.3 mg/dL (ref 0.2–1.2)
Total Protein: 6.3 g/dL (ref 6.1–8.1)

## 2020-04-08 LAB — LIPID PANEL
Cholesterol: 177 mg/dL (ref <200)
HDL: 65 mg/dL (ref >=50)
LDL Cholesterol (Calc): 88 mg/dL (calc)
Non-HDL Cholesterol (Calc): 112 mg/dL (calc) (ref <130)
Total CHOL/HDL Ratio: 2.7 (calc) (ref <5.0)
Triglycerides: 141 mg/dL (ref <150)

## 2020-04-08 LAB — INSULIN, RANDOM: Insulin: 8.4 u[IU]/mL

## 2020-04-08 LAB — VITAMIN D 25 HYDROXY (VIT D DEFICIENCY, FRACTURES): Vit D, 25-Hydroxy: 36 ng/mL (ref 30–100)

## 2020-04-08 LAB — URINE CULTURE
MICRO NUMBER:: 10722105
SPECIMEN QUALITY:: ADEQUATE

## 2020-04-08 LAB — TSH: TSH: 0.55 mIU/L (ref 0.40–4.50)

## 2020-04-08 LAB — IRON, TOTAL/TOTAL IRON BINDING CAP
%SAT: 23 % (calc) (ref 16–45)
Iron: 64 ug/dL (ref 45–160)
TIBC: 283 mcg/dL (calc) (ref 250–450)

## 2020-04-08 LAB — MAGNESIUM: Magnesium: 1.7 mg/dL (ref 1.5–2.5)

## 2020-04-08 MED ORDER — CEPHALEXIN 500 MG PO CAPS: ORAL_CAPSULE | ORAL | 0 refills | Status: DC

## 2020-04-13 ENCOUNTER — Ambulatory Visit: Payer: Medicare PPO | Admitting: Adult Health

## 2020-05-04 DIAGNOSIS — M5416 Radiculopathy, lumbar region: Secondary | ICD-10-CM | POA: Diagnosis not present

## 2020-05-05 ENCOUNTER — Other Ambulatory Visit: Payer: Self-pay | Admitting: Internal Medicine

## 2020-05-05 DIAGNOSIS — M79662 Pain in left lower leg: Secondary | ICD-10-CM

## 2020-05-05 DIAGNOSIS — M79661 Pain in right lower leg: Secondary | ICD-10-CM

## 2020-05-11 ENCOUNTER — Other Ambulatory Visit: Payer: Self-pay

## 2020-05-11 ENCOUNTER — Ambulatory Visit: Payer: Medicare PPO | Admitting: *Deleted

## 2020-05-11 VITALS — BP 122/66 | HR 72 | Temp 97.0°F | Resp 16 | Ht 61.0 in | Wt 135.0 lb

## 2020-05-11 DIAGNOSIS — R300 Dysuria: Secondary | ICD-10-CM | POA: Diagnosis not present

## 2020-05-11 NOTE — Progress Notes (Signed)
Patient is here for  NV to recheck her urinalysis and a urine culture. She has finished her Keflex, but states she never completely stopped having symptoms. She states she still has burning, frequency and urgency and is incontinent at times.

## 2020-05-12 LAB — URINE CULTURE
MICRO NUMBER:: 10859975
SPECIMEN QUALITY:: ADEQUATE

## 2020-05-12 LAB — URINALYSIS, ROUTINE W REFLEX MICROSCOPIC
Bilirubin Urine: NEGATIVE
Glucose, UA: NEGATIVE
Hgb urine dipstick: NEGATIVE
Ketones, ur: NEGATIVE
Nitrite: NEGATIVE
Protein, ur: NEGATIVE
RBC / HPF: NONE SEEN /HPF (ref 0–2)
Specific Gravity, Urine: 1.015 (ref 1.001–1.03)
Squamous Epithelial / HPF: NONE SEEN /HPF (ref <=5)
pH: 7.5 (ref 5.0–8.0)

## 2020-05-12 NOTE — Progress Notes (Signed)
===================================================  -    U/C returned shows No Infection ===================================================

## 2020-05-13 ENCOUNTER — Other Ambulatory Visit: Payer: Self-pay | Admitting: Internal Medicine

## 2020-05-13 MED ORDER — FLUCONAZOLE 150 MG PO TABS: ORAL_TABLET | ORAL | 3 refills | Status: DC

## 2020-05-14 DIAGNOSIS — N3946 Mixed incontinence: Secondary | ICD-10-CM | POA: Diagnosis not present

## 2020-05-28 DIAGNOSIS — H26493 Other secondary cataract, bilateral: Secondary | ICD-10-CM | POA: Diagnosis not present

## 2020-05-28 DIAGNOSIS — H40013 Open angle with borderline findings, low risk, bilateral: Secondary | ICD-10-CM | POA: Diagnosis not present

## 2020-05-28 DIAGNOSIS — H18413 Arcus senilis, bilateral: Secondary | ICD-10-CM | POA: Diagnosis not present

## 2020-05-28 DIAGNOSIS — H26491 Other secondary cataract, right eye: Secondary | ICD-10-CM | POA: Diagnosis not present

## 2020-05-28 DIAGNOSIS — H5212 Myopia, left eye: Secondary | ICD-10-CM | POA: Diagnosis not present

## 2020-05-28 DIAGNOSIS — Z961 Presence of intraocular lens: Secondary | ICD-10-CM | POA: Diagnosis not present

## 2020-05-28 DIAGNOSIS — H26492 Other secondary cataract, left eye: Secondary | ICD-10-CM | POA: Diagnosis not present

## 2020-06-02 DIAGNOSIS — Z981 Arthrodesis status: Secondary | ICD-10-CM | POA: Diagnosis not present

## 2020-06-02 DIAGNOSIS — M5416 Radiculopathy, lumbar region: Secondary | ICD-10-CM | POA: Diagnosis not present

## 2020-06-03 ENCOUNTER — Other Ambulatory Visit: Payer: Self-pay | Admitting: Internal Medicine

## 2020-06-03 DIAGNOSIS — M79662 Pain in left lower leg: Secondary | ICD-10-CM

## 2020-06-16 DIAGNOSIS — H26491 Other secondary cataract, right eye: Secondary | ICD-10-CM | POA: Diagnosis not present

## 2020-06-16 DIAGNOSIS — H5201 Hypermetropia, right eye: Secondary | ICD-10-CM | POA: Diagnosis not present

## 2020-06-16 DIAGNOSIS — H5212 Myopia, left eye: Secondary | ICD-10-CM | POA: Diagnosis not present

## 2020-06-16 DIAGNOSIS — H52221 Regular astigmatism, right eye: Secondary | ICD-10-CM | POA: Diagnosis not present

## 2020-06-16 DIAGNOSIS — H524 Presbyopia: Secondary | ICD-10-CM | POA: Diagnosis not present

## 2020-06-16 DIAGNOSIS — Z961 Presence of intraocular lens: Secondary | ICD-10-CM | POA: Diagnosis not present

## 2020-06-24 ENCOUNTER — Encounter: Payer: Medicare Other | Admitting: Physician Assistant

## 2020-07-01 ENCOUNTER — Telehealth: Payer: Self-pay

## 2020-07-01 NOTE — Telephone Encounter (Signed)
Left message on voice mail  to call back

## 2020-07-01 NOTE — Telephone Encounter (Signed)
Patient is scheduled to be seen tomorrow.

## 2020-07-01 NOTE — Progress Notes (Signed)
Assessment and Plan:  Monique Thomas was seen today for rash.  Diagnoses and all orders for this visit:    Labile hypertension Controlled today  Hyperlipidemia, mixed Continue medications: Discussed dietary and exercise modifications Low fat diet  Aortic atherosclerosis ([facility]) CT 09/25/19  Needs flu shot -     Flu vaccine HIGH DOSE PF Received today  Rash Hives of unknown origin -     predniSONE (STERAPRED UNI-PAK 21 TAB) 10 MG (21) TBPK tablet; Take by mouth daily. Follow directions on Taper pack Discussed topical benadryl cream Discussed mild soap, no Dial Continue to monitor  Anxiety Doing well at this time -     ALPRAZolam (XANAX) 1 MG tablet; Take 1/2 to 1 tablet  At Bedtime & please try to limit to 5 days /week to avoid addiction    Further disposition pending results of labs. Discussed med's effects and SE's.   Over 20 minutes of face to face interview, exam, counseling, chart review, and critical decision making was performed.   Future Appointments  Date Time Provider [place]  07/15/2020  2:00 PM Dominga Mcduffie, Danton Sewer, NP [facility] None    ------------------------------------------------------------------------------------------------------------------   HPI 69 y.o.female presents for evaluation of rash that started two days ago.  She was out in the yard/garden weeding.  That night she noted under her breast and on her stomach and between her legs.  She reports it is very itchy and she has red areas to the affected sites, slightly raised. She has tried benadryl 50mg  at first that helped.  Then every 4-6 hours she was taking 25mg  since.  She stopped taking the gabapentin as she was concerned about the medication. She has benadryl cream, cortisone and triamcinolone topical.  Report the benadryl cream worked the best. Distribution is front abdomen, under bilateral breasts, flanks, entire surface of buttocks and gluteal folds bilateral.  Past Medical History:   Diagnosis Date  . Anemia   . Anxiety   . Arthritis   . Back pain, chronic   . Cancer ([place])  2 weeks ago    skin cancer  left anterior wrist   and right side of  face     . Depression   . Diverticulosis 09/25/2019  . GERD (gastroesophageal reflux disease)   . Hyperlipidemia   . Hypothyroidism   . Kidney stone    2015  . Thyroid disease   . Umbilical hernia   . Unspecified vitamin D deficiency      Allergies  Allergen Reactions  . Celebrex [Celecoxib] Other (See Comments)    "panic attack"  . Other Other (See Comments)    Skin bruises very easily and peels back  . Topamax [Topiramate] Other (See Comments)    MEMORY  . Entex Lq [Phenylephrine-Guaifenesin] Rash  . Fiorinal [Butalbital-Aspirin-Caffeine] Rash    Current Outpatient Medications on File Prior to Visit  Medication Sig  . Cholecalciferol (VITAMIN D3 PO) Take 5,000 Units by mouth 2 (two) times a day.   . Cyanocobalamin (B-12 PO) Take 1,000 mcg by mouth daily.   . cyclobenzaprine (FLEXERIL) 5 MG tablet Take 5 mg by mouth at bedtime.  Marland Kitchen estradiol (ESTRACE) 1 MG tablet Take 1 mg by mouth daily.  Marland Kitchen gabapentin (NEURONTIN) 100 MG capsule Take    1 to 3 capsules      3 x /day      as needed for Pain  . levothyroxine (SYNTHROID) 88 MCG tablet Take 1/2 tablet one day a week and 1 tablet all other days.  Marland Kitchen  Loperamide HCl (IMODIUM PO) Take by mouth.  . Magnesium 500 MG TABS Take 500 mg by mouth 4 (four) times daily.  Marland Kitchen oxybutynin (DITROPAN) 5 MG tablet Take 1 tablet 3 x /day for Bladder control   No current facility-administered medications on file prior to visit.    ROS: all negative except above.   Physical Exam:  BP 124/68   Pulse 63   Temp (!) 97.3 F (36.3 C)   Wt 135 lb (61.2 kg)   SpO2 99%   BMI 25.51 kg/m   General Appearance: Well nourished, in no apparent distress. Eyes: PERRLA, EOMs, conjunctiva no swelling or erythema Sinuses: No Frontal/maxillary tenderness ENT/Mouth: Ext aud canals clear, TMs  without erythema, bulging. No erythema, swelling, or exudate on post pharynx.  Tonsils not swollen or erythematous. Hearing normal.  Neck: Supple, thyroid normal.  Respiratory: Respiratory effort normal, BS equal bilaterally without rales, rhonchi, wheezing or stridor.  Cardio: RRR with no MRGs. Brisk peripheral pulses without edema.  Abdomen: Soft, + BS.  Non tender, no guarding, rebound, hernias, masses. Lymphatics: Non tender without lymphadenopathy.  Musculoskeletal: Full ROM, 5/5 strength, normal gait.  Skin: Warm, dry lesions, ecchymosis. Hives to abdomen, under bilateral breasts, bilateral buttocks extending down gluteal folds. Neuro: Cranial nerves intact. Normal muscle tone, no cerebellar symptoms. Sensation intact.  Psych: Awake and oriented X 3, normal affect, Insight and Judgment appropriate.     [name], NP 9:24 AM [facility]

## 2020-07-01 NOTE — Telephone Encounter (Signed)
Patient states that she was working in the yard yesterday and has a rash or bites in her vaginal area. Please advise.

## 2020-07-02 ENCOUNTER — Encounter: Payer: Self-pay | Admitting: Adult Health Nurse Practitioner

## 2020-07-02 ENCOUNTER — Ambulatory Visit: Payer: Medicare PPO | Admitting: Adult Health Nurse Practitioner

## 2020-07-02 ENCOUNTER — Other Ambulatory Visit: Payer: Self-pay

## 2020-07-02 VITALS — BP 124/68 | HR 63 | Temp 97.3°F | Wt 135.0 lb

## 2020-07-02 DIAGNOSIS — Z23 Encounter for immunization: Secondary | ICD-10-CM

## 2020-07-02 DIAGNOSIS — R21 Rash and other nonspecific skin eruption: Secondary | ICD-10-CM | POA: Diagnosis not present

## 2020-07-02 DIAGNOSIS — R0989 Other specified symptoms and signs involving the circulatory and respiratory systems: Secondary | ICD-10-CM

## 2020-07-02 DIAGNOSIS — I700 Atherosclerosis of aorta: Secondary | ICD-10-CM

## 2020-07-02 DIAGNOSIS — F419 Anxiety disorder, unspecified: Secondary | ICD-10-CM

## 2020-07-02 DIAGNOSIS — L509 Urticaria, unspecified: Secondary | ICD-10-CM

## 2020-07-02 DIAGNOSIS — E782 Mixed hyperlipidemia: Secondary | ICD-10-CM

## 2020-07-02 MED ORDER — PREDNISONE 10 MG (21) PO TBPK: ORAL_TABLET | Freq: Every day | ORAL | 0 refills | Status: DC

## 2020-07-02 MED ORDER — ALPRAZOLAM 1 MG PO TABS: ORAL_TABLET | ORAL | 0 refills | Status: DC

## 2020-07-02 NOTE — Patient Instructions (Signed)
   We have sent in a prednisone taper, be sure to take this with food.  Directs will be on the taper pack.   Use mild soap when you shower, like Dove.   You may take benadryl for the itching 50mg .  Do not dive or operate heavy machinery while taking.  You can also take Zyrtec (Citirizine) for the itching, rotate with benadryl and use the one that works the best for you.  You may continue your gabapentin.  Contact office with ay new or worsening symptoms.

## 2020-07-04 ENCOUNTER — Other Ambulatory Visit: Payer: Self-pay | Admitting: Internal Medicine

## 2020-07-04 DIAGNOSIS — N3281 Overactive bladder: Secondary | ICD-10-CM

## 2020-07-07 DIAGNOSIS — R3915 Urgency of urination: Secondary | ICD-10-CM | POA: Diagnosis not present

## 2020-07-07 DIAGNOSIS — N393 Stress incontinence (female) (male): Secondary | ICD-10-CM | POA: Diagnosis not present

## 2020-07-07 DIAGNOSIS — N3941 Urge incontinence: Secondary | ICD-10-CM | POA: Diagnosis not present

## 2020-07-15 ENCOUNTER — Encounter: Payer: Self-pay | Admitting: Adult Health Nurse Practitioner

## 2020-07-15 ENCOUNTER — Ambulatory Visit (INDEPENDENT_AMBULATORY_CARE_PROVIDER_SITE_OTHER): Payer: Medicare PPO | Admitting: Adult Health Nurse Practitioner

## 2020-07-15 ENCOUNTER — Other Ambulatory Visit: Payer: Self-pay

## 2020-07-15 VITALS — BP 122/86 | HR 68 | Temp 97.3°F | Ht 61.0 in | Wt 133.0 lb

## 2020-07-15 DIAGNOSIS — G2581 Restless legs syndrome: Secondary | ICD-10-CM

## 2020-07-15 DIAGNOSIS — E782 Mixed hyperlipidemia: Secondary | ICD-10-CM | POA: Diagnosis not present

## 2020-07-15 DIAGNOSIS — Z1329 Encounter for screening for other suspected endocrine disorder: Secondary | ICD-10-CM

## 2020-07-15 DIAGNOSIS — I700 Atherosclerosis of aorta: Secondary | ICD-10-CM

## 2020-07-15 DIAGNOSIS — Z79899 Other long term (current) drug therapy: Secondary | ICD-10-CM

## 2020-07-15 DIAGNOSIS — R7309 Other abnormal glucose: Secondary | ICD-10-CM

## 2020-07-15 DIAGNOSIS — E559 Vitamin D deficiency, unspecified: Secondary | ICD-10-CM

## 2020-07-15 DIAGNOSIS — R0989 Other specified symptoms and signs involving the circulatory and respiratory systems: Secondary | ICD-10-CM | POA: Diagnosis not present

## 2020-07-15 DIAGNOSIS — N3281 Overactive bladder: Secondary | ICD-10-CM

## 2020-07-15 DIAGNOSIS — Z6825 Body mass index (BMI) 25.0-25.9, adult: Secondary | ICD-10-CM

## 2020-07-15 DIAGNOSIS — Z981 Arthrodesis status: Secondary | ICD-10-CM

## 2020-07-15 DIAGNOSIS — L509 Urticaria, unspecified: Secondary | ICD-10-CM

## 2020-07-15 DIAGNOSIS — E538 Deficiency of other specified B group vitamins: Secondary | ICD-10-CM

## 2020-07-15 DIAGNOSIS — Z0000 Encounter for general adult medical examination without abnormal findings: Secondary | ICD-10-CM

## 2020-07-15 DIAGNOSIS — K2100 Gastro-esophageal reflux disease with esophagitis, without bleeding: Secondary | ICD-10-CM

## 2020-07-15 DIAGNOSIS — F419 Anxiety disorder, unspecified: Secondary | ICD-10-CM

## 2020-07-15 DIAGNOSIS — Z1389 Encounter for screening for other disorder: Secondary | ICD-10-CM | POA: Diagnosis not present

## 2020-07-15 DIAGNOSIS — G894 Chronic pain syndrome: Secondary | ICD-10-CM

## 2020-07-15 DIAGNOSIS — M85852 Other specified disorders of bone density and structure, left thigh: Secondary | ICD-10-CM

## 2020-07-15 DIAGNOSIS — Z136 Encounter for screening for cardiovascular disorders: Secondary | ICD-10-CM

## 2020-07-15 DIAGNOSIS — Z23 Encounter for immunization: Secondary | ICD-10-CM

## 2020-07-15 NOTE — Progress Notes (Signed)
COMPLETE PHYSICAL  Assessment:      Diagnoses and all orders for this visit:  Labile hypertension Controlled with llifestlye Monitor blood pressure at home; call if consistently over 130/80 Continue DASH diet.   Reminder to go to the ER if any CP, SOB, nausea, dizziness, severe HA, changes vision/speech, left arm numbness and tingling and jaw pain.  Hyperlipidemia, mixed Continue current medications: Monitor blood pressure at home; call if consistently over 130/80 Continue DASH diet.   Reminder to go to the ER if any CP, SOB, nausea, dizziness, severe HA, changes vision/speech, left arm numbness and tingling and jaw pain.  Aortic atherosclerosis ([place]) CT 09/25/19 Control blood pressure, lipids and glucose Disscused lifestyle modifications, diet & exercise Continue to monitor  Gastroesophageal reflux disease with esophagitis without hemorrhage Doing well at this time Continue:  Diet discussed Monitor for triggers Avoid food with high acid content Avoid excessive cafeine Increase water intake  Hives of unknown origin Improved from last OV  Vitamin D deficiency Continue supplementation to maintain goal of 70-100 Taking Vitamin D 5,000 IU BID   S/P lumbar fusion Chronic pain syndrome Continue follow up  Abnormal glucose Discussed dietary and exercise modifications  Anxiety Doing well on current regiment Continue alprazolam 1/2 to 1 tablet at bedtime Discussed stress management techniques   Discussed good sleep hygiene Discussed increasing physical activity and exercise Increase water intake  Osteopenia of left hip Dexa UTD Ca & Vit D Weight bearing exercises  OAB (overactive bladder) No longer taking oxybutynin Managing at this time  Restless legs Continue current treatment: Gabapentin 100mg , nightly  B12 deficiency Taking 1,050mcg daily Check level today  BMI 25.0-25.9 Discussed dietary and exercise modifications  Medication  management Continued   Further disposition pending results if labs check today. Discussed med's effects and SE's.   Over 40 minutes of face to face interview, exam, counseling, chart review, and critical decision making was performed.    Future Appointments  Date Time Provider [place]  01/28/2021  9:30 AM [name], MD [facility] None  07/15/2021  2:00 PM [name], NP [facility] None      Subjective: HPI  Monique Thomas is a 69 y.o. female who presents for complete physical and HLD, GERD, anxiety, chronic pain syndrome-lumbar, vitamin D & B12 deficiency, abnormal glucose and weight.  Saw the Urologist 1-2 months ago and then one week ago.  She was on Myrbetriq  And oxybutnin. She reports that she had tingling in her feet, pins and needles. She reports that it is intermittent and felt like it was from the Mybetriq and the elevated blood pressure she stopped this.  She also stopped the oxybutinin.  She is going to return for further evaluation.  She had a bladder sling in 2004. She has had physical therapy and is not interested in pessary at this time.   She is taking gabapentin 100mg  twice a day for chroinic pain and RLS at night.  3 months of persistent belching and gassiness, bloating, AM diarrhea was initiated on omeprazole last visit and synthroid was adjusted. She reports stress at home improved, sold house and now off of PPI and symptoms are resolved.   She is followed by [facility]. She is s/p hysterectomy, on estrace 1 mg via GYN.   She has chronic lower back pain, s/p fusion, getting injections via neurosurgery. Doing PT with ortho for knee, s/p TKA. She has ongoing concerns with RLS which are worse following her last knee surgery, improved  with increased activity, PRN muscle relaxer, and declined further interventions.   she has a diagnosis of anxiety and is currently on xanax 1 mg, reports symptoms are well controlled on current regimen. she  currently takes 1 tab at night to help with sleep/restless legs.   BMI is Body mass index is 25.13 kg/m., she has been working on diet and exercise. Wt Readings from Last 3 Encounters:  07/15/20 133 lb (60.3 kg)  07/02/20 135 lb (61.2 kg)  05/11/20 135 lb (61.2 kg)   Today their BP is BP: 122/86 She does workout. She denies chest pain, shortness of breath, dizziness.   She is not on cholesterol medication and denies myalgias. Her cholesterol is at goal. The cholesterol last visit was:   Lab Results  Component Value Date   CHOL 208 (H) 07/15/2020   HDL 78 07/15/2020   LDLCALC 107 (H) 07/15/2020   TRIG 123 07/15/2020   CHOLHDL 2.7 07/15/2020    She has been working on diet and exercise for glucose management, and denies foot ulcerations, increased appetite, nausea, paresthesia of the feet, polydipsia, polyuria, visual disturbances, vomiting and weight loss. Last A1C in the office was:  Lab Results  Component Value Date   HGBA1C 5.4 07/15/2020   She is on thyroid medication. Her medication was changed last visit, reduced from 88 mcg daily to 1/2 tab twice a week and whole tab other days.  Lab Results  Component Value Date   TSH 0.76 07/15/2020   Last GFR: Lab Results  Component Value Date   GFRNONAA 92 07/15/2020   Patient is on Vitamin D supplement  Lab Results  Component Value Date   VD25OH 71 07/15/2020        Medication Review: Current Outpatient Medications on File Prior to Visit  Medication Sig Dispense Refill   ALPRAZolam (XANAX) 1 MG tablet Take 1/2 to 1 tablet  At Bedtime & please try to limit to 5 days /week to avoid addiction 90 tablet 0   Cholecalciferol (VITAMIN D3 PO) Take 5,000 Units by mouth 2 (two) times a day.      Cyanocobalamin (B-12 PO) Take 1,000 mcg by mouth daily.      cyclobenzaprine (FLEXERIL) 5 MG tablet Take 5 mg by mouth at bedtime.     estradiol (ESTRACE) 1 MG tablet Take 1 mg by mouth daily.     gabapentin (NEURONTIN) 100 MG capsule  Take    1 to 3 capsules      3 x /day      as needed for Pain 270 capsule 0   levothyroxine (SYNTHROID) 88 MCG tablet Take 1/2 tablet one day a week and 1 tablet all other days. 30 tablet 11   Loperamide HCl (IMODIUM PO) Take by mouth.     Magnesium 500 MG TABS Take 500 mg by mouth 4 (four) times daily.     oxybutynin (DITROPAN) 5 MG tablet Take     1 tablet    3 x /day      for Bladder 270 tablet 0   predniSONE (STERAPRED UNI-PAK 21 TAB) 10 MG (21) TBPK tablet Take by mouth daily. Follow directions on Taper pack 21 tablet 0   No current facility-administered medications on file prior to visit.    Allergies  Allergen Reactions   Celebrex [Celecoxib] Other (See Comments)    "panic attack"   Other Other (See Comments)    Skin bruises very easily and peels back   Topamax [Topiramate] Other (See Comments)  MEMORY   Entex Lq [Phenylephrine-Guaifenesin] Rash   Fiorinal [Butalbital-Aspirin-Caffeine] Rash    Current Problems (verified) Patient Active Problem List   Diagnosis Date Noted   Aortic atherosclerosis ([place]) 09/25/2019   Diverticulosis 09/25/2019   Osteopenia 02/26/2019   Menopausal symptoms 05/07/2018   Restless legs 02/06/2018   Chronic pain syndrome 03/27/2015   Medication management 01/15/2014   Other abnormal glucose 01/15/2014   Back pain, chronic    Hyperlipidemia    Hypothyroidism    GERD (gastroesophageal reflux disease)    Anxiety    Vitamin D deficiency     Screening Tests Immunization History  Administered Date(s) Administered   Influenza Split 06/29/2015   Influenza Whole 05/21/2013   Influenza, High Dose Seasonal PF 05/30/2016, 06/13/2017, 06/18/2018, 06/24/2019, 07/02/2020   Influenza,inj,quad, With Preservative 06/19/2017   Influenza-Unspecified 06/09/2014, 06/20/2015   PPD Test 09/23/2013, 10/01/2014, 02/26/2016   Pneumococcal Conjugate-13 05/03/2017   Pneumococcal Polysaccharide-23 09/23/2013    Pneumococcal-Unspecified 06/19/2017   Td 09/19/2005   Tdap 10/07/2015   Zoster 03/27/2014   Preventative care: Last colonoscopy: 2008  Cologuard: 04/2017 neg due 04/2021 Last mammogram: 08/2019 Last pap smear/pelvic exam: 09/2017 GYN normal, [place]  DEXA: 01/2017 L fem -1.2 postpone to next year, ordered to schedule with next mammogram, discussed with patient.  Prior vaccinations: TD or Tdap: 2017  Influenza: 10/14//2021 Pneumococcal: 2015 Prevnar13: 2018 Shingles/Zostavax: 2015, will check with insurance about shingrix coverage  Covid 19: declines, discussed with patient  Names of Other Physician/Practitioners you currently use: 1. [facility] here for primary care 2. Dr. Einar Gip, eye doctor, last visit 2021, s/p cataracts 3. Dr. Toy Cookey, dentist, Q6 months, last visit 2021, Q73m  Patient Care Team: [name], MD as PCP - General (Internal Medicine) Darlis Loan, MD as Referring Physician (Gastroenterology) Vania Rea, MD as Consulting Physician (Obstetrics and Gynecology) Carolan Clines, MD (Inactive) as Consulting Physician (Urology) Zara Council as Physician Assistant (Orthopedic Surgery) Lavonna Monarch, MD as Consulting Physician (Dermatology) Gatha Mayer, MD as Consulting Physician (Gastroenterology) Webb Laws, [place] as Referring Physician (Optometry)  SURGICAL HISTORY She  has a past surgical history that includes Abdominal hysterectomy; Cesarean section; Hernia repair; Appendectomy; Tonsillectomy and adenoidectomy; Spine surgery; Laparoscopic lysis intestinal adhesions; Maximum access (mas)posterior lumbar interbody fusion (plif) 1 level (N/A, 04/16/2015); Anterior lat lumbar fusion (N/A, 04/07/2017); and Total knee arthroplasty (Left, 07/17/2018). FAMILY HISTORY Her family history includes Asthma in her sister; Cancer in her maternal grandmother; Heart disease in her mother; Hyperlipidemia in her  mother; Thyroid disease in her mother. SOCIAL HISTORY She  reports that she has never smoked. She has never used smokeless tobacco. She reports that she does not drink alcohol and does not use drugs.    Review of Systems  Constitutional: Negative for malaise/fatigue and weight loss.  HENT: Negative for hearing loss and tinnitus.   Eyes: Negative for blurred vision and double vision.  Respiratory: Negative for cough, sputum production, shortness of breath and wheezing.   Cardiovascular: Negative for chest pain, palpitations, orthopnea, claudication, leg swelling and PND.  Gastrointestinal: Negative for abdominal pain, blood in stool, constipation, diarrhea, heartburn, melena, nausea and vomiting.  Genitourinary: Negative.   Musculoskeletal: Negative for falls, joint pain and myalgias.  Skin: Negative for rash.  Neurological: Negative for dizziness, tingling, sensory change, weakness and headaches.  Endo/Heme/Allergies: Negative for polydipsia.  Psychiatric/Behavioral: Negative for depression, memory loss, substance abuse and suicidal ideas. The patient has insomnia. The patient is not nervous/anxious.   All other  systems reviewed and are negative.    Objective:     Today's Vitals   07/15/20 1401  BP: 122/86  Pulse: 68  Temp: (!) 97.3 F (36.3 C)  SpO2: 98%  Weight: 133 lb (60.3 kg)  Height: 5\' 1"  (1.549 m)   Body mass index is 25.13 kg/m.  General appearance: alert, no distress, WD/WN, female HEENT: normocephalic, sclerae anicteric, TMs pearly, nares patent, no discharge or erythema, pharynx normal Oral cavity: MMM, no lesions Neck: supple, no lymphadenopathy, no thyromegaly, no masses Heart: RRR, normal S1, S2, no murmurs Lungs: CTA bilaterally, no wheezes, rhonchi, or rales Abdomen: +bs, soft, non tender, non distended, no masses, no hepatomegaly, no splenomegaly Musculoskeletal: nontender, no swelling, no obvious deformity Extremities: no edema, no cyanosis, no  clubbing Pulses: 2+ symmetric, upper and lower extremities, normal cap refill Neurological: alert, oriented x 3, CN2-12 intact, strength normal upper extremities and lower extremities, sensation normal throughout, DTRs 2+ throughout, no cerebellar signs, gait normal Psychiatric: normal affect, behavior normal, pleasant     [name], Laqueta Jean, DNP [facility] 07/15/2020  3:35 PM

## 2020-07-17 LAB — COMPLETE METABOLIC PANEL WITH GFR
AG Ratio: 1.7 (calc) (ref 1.0–2.5)
ALT: 17 U/L (ref 6–29)
AST: 23 U/L (ref 10–35)
Albumin: 4.2 g/dL (ref 3.6–5.1)
Alkaline phosphatase (APISO): 53 U/L (ref 37–153)
BUN: 13 mg/dL (ref 7–25)
CO2: 27 mmol/L (ref 20–32)
Calcium: 9.4 mg/dL (ref 8.6–10.4)
Chloride: 104 mmol/L (ref 98–110)
Creat: 0.62 mg/dL (ref 0.50–0.99)
GFR, Est African American: 107 mL/min/{1.73_m2} (ref >=60)
GFR, Est Non African American: 92 mL/min/{1.73_m2} (ref >=60)
Globulin: 2.5 g/dL (calc) (ref 1.9–3.7)
Glucose, Bld: 87 mg/dL (ref 65–99)
Potassium: 3.9 mmol/L (ref 3.5–5.3)
Sodium: 139 mmol/L (ref 135–146)
Total Bilirubin: 0.5 mg/dL (ref 0.2–1.2)
Total Protein: 6.7 g/dL (ref 6.1–8.1)

## 2020-07-17 LAB — HEMOGLOBIN A1C
Hgb A1c MFr Bld: 5.4 % of total Hgb (ref <5.7)
Mean Plasma Glucose: 108 (calc)
eAG (mmol/L): 6 (calc)

## 2020-07-17 LAB — URINALYSIS W MICROSCOPIC + REFLEX CULTURE
Bacteria, UA: NONE SEEN /HPF
Bilirubin Urine: NEGATIVE
Glucose, UA: NEGATIVE
Hgb urine dipstick: NEGATIVE
Hyaline Cast: NONE SEEN /LPF
Ketones, ur: NEGATIVE
Leukocyte Esterase: NEGATIVE
Nitrites, Initial: NEGATIVE
Protein, ur: NEGATIVE
RBC / HPF: NONE SEEN /HPF (ref 0–2)
Specific Gravity, Urine: 1.013 (ref 1.001–1.03)
Squamous Epithelial / HPF: NONE SEEN /HPF (ref <=5)
WBC, UA: NONE SEEN /HPF (ref 0–5)
pH: 7 (ref 5.0–8.0)

## 2020-07-17 LAB — CBC WITH DIFFERENTIAL/PLATELET
Absolute Monocytes: 389 cells/uL (ref 200–950)
Basophils Absolute: 32 cells/uL (ref 0–200)
Basophils Relative: 0.6 %
Eosinophils Absolute: 103 cells/uL (ref 15–500)
Eosinophils Relative: 1.9 %
HCT: 37 % (ref 35.0–45.0)
Hemoglobin: 12.5 g/dL (ref 11.7–15.5)
Lymphs Abs: 1836 cells/uL (ref 850–3900)
MCH: 30.5 pg (ref 27.0–33.0)
MCHC: 33.8 g/dL (ref 32.0–36.0)
MCV: 90.2 fL (ref 80.0–100.0)
MPV: 8.6 fL (ref 7.5–12.5)
Monocytes Relative: 7.2 %
Neutro Abs: 3040 cells/uL (ref 1500–7800)
Neutrophils Relative %: 56.3 %
Platelets: 329 10*3/uL (ref 140–400)
RBC: 4.1 10*6/uL (ref 3.80–5.10)
RDW: 12.8 % (ref 11.0–15.0)
Total Lymphocyte: 34 %
WBC: 5.4 10*3/uL (ref 3.8–10.8)

## 2020-07-17 LAB — TEST AUTHORIZATION

## 2020-07-17 LAB — LIPID PANEL
Cholesterol: 208 mg/dL — ABNORMAL HIGH (ref <200)
HDL: 78 mg/dL (ref >=50)
LDL Cholesterol (Calc): 107 mg/dL (calc) — ABNORMAL HIGH
Non-HDL Cholesterol (Calc): 130 mg/dL (calc) — ABNORMAL HIGH (ref <130)
Total CHOL/HDL Ratio: 2.7 (calc) (ref <5.0)
Triglycerides: 123 mg/dL (ref <150)

## 2020-07-17 LAB — MAGNESIUM: Magnesium: 2 mg/dL (ref 1.5–2.5)

## 2020-07-17 LAB — NO CULTURE INDICATED

## 2020-07-17 LAB — MICROALBUMIN / CREATININE URINE RATIO
Creatinine, Urine: 83 mg/dL (ref 20–275)
Microalb Creat Ratio: 5 mcg/mg creat (ref <30)
Microalb, Ur: 0.4 mg/dL

## 2020-07-17 LAB — VITAMIN B12: Vitamin B-12: >2000 pg/mL — ABNORMAL HIGH (ref 200–1100)

## 2020-07-17 LAB — TSH: TSH: 0.76 mIU/L (ref 0.40–4.50)

## 2020-07-17 LAB — VITAMIN D 25 HYDROXY (VIT D DEFICIENCY, FRACTURES): Vit D, 25-Hydroxy: 71 ng/mL (ref 30–100)

## 2020-07-20 NOTE — Progress Notes (Signed)
Patient is aware of lab results and instructions. She states that her insurance will approve 90 days of alprazolam as long as you give them the "okay." Patient states they are supposed to be faxing [name] something to have the 90 day approved. She says that she has been having tingling in her feet for approximately a month. She says that she will contact the office if it doesn't resolve. -e welch

## 2020-08-06 DIAGNOSIS — M5416 Radiculopathy, lumbar region: Secondary | ICD-10-CM | POA: Diagnosis not present

## 2020-08-27 DIAGNOSIS — R351 Nocturia: Secondary | ICD-10-CM | POA: Diagnosis not present

## 2020-08-27 DIAGNOSIS — R350 Frequency of micturition: Secondary | ICD-10-CM | POA: Diagnosis not present

## 2020-08-27 DIAGNOSIS — N3946 Mixed incontinence: Secondary | ICD-10-CM | POA: Diagnosis not present

## 2020-09-03 DIAGNOSIS — M5416 Radiculopathy, lumbar region: Secondary | ICD-10-CM | POA: Diagnosis not present

## 2020-09-03 DIAGNOSIS — Z981 Arthrodesis status: Secondary | ICD-10-CM | POA: Diagnosis not present

## 2020-09-03 DIAGNOSIS — Z1231 Encounter for screening mammogram for malignant neoplasm of breast: Secondary | ICD-10-CM | POA: Diagnosis not present

## 2020-09-03 LAB — HM MAMMOGRAPHY

## 2020-09-15 ENCOUNTER — Other Ambulatory Visit: Payer: Self-pay

## 2020-09-15 ENCOUNTER — Encounter: Payer: Self-pay | Admitting: Adult Health Nurse Practitioner

## 2020-09-15 ENCOUNTER — Ambulatory Visit: Payer: Medicare PPO | Admitting: Adult Health Nurse Practitioner

## 2020-09-15 VITALS — BP 118/72 | HR 74 | Temp 97.6°F | Ht 61.0 in | Wt 135.4 lb

## 2020-09-15 DIAGNOSIS — J0140 Acute pansinusitis, unspecified: Secondary | ICD-10-CM | POA: Diagnosis not present

## 2020-09-15 DIAGNOSIS — H66001 Acute suppurative otitis media without spontaneous rupture of ear drum, right ear: Secondary | ICD-10-CM | POA: Diagnosis not present

## 2020-09-15 MED ORDER — DEXAMETHASONE 4 MG PO TABS: ORAL_TABLET | ORAL | 1 refills | Status: DC

## 2020-09-15 MED ORDER — AMOXICILLIN-POT CLAVULANATE 875-125 MG PO TABS: 1.0000 | ORAL_TABLET | Freq: Two times a day (BID) | ORAL | 0 refills | Status: DC

## 2020-09-15 NOTE — Progress Notes (Signed)
Assessment and Plan:  Monique Thomas was seen today for acute visit.  Diagnoses and all orders for this visit:  Non-recurrent acute suppurative otitis media of right ear without spontaneous rupture of tympanic membrane -     amoxicillin-clavulanate (AUGMENTIN) 875-125 MG tablet; Take 1 tablet by mouth 2 (two) times daily. 10 days  Acute non-recurrent pansinusitis -     dexamethasone (DECADRON) 4 MG tablet; Take 1 tab 3 x day - 3 days, then 2 x day - 3 days, then 1 tab daily Continue nasal spray and zyrtec nghtly Increase water intake     Further disposition pending results of labs. Discussed med's effects and SE's.   Over 30 minutes of face to face interview,  exam, counseling, chart review, and critical decision making was performed.   Future Appointments  Date Time Provider Department Center  01/28/2021  9:30 AM Lucky Cowboy, MD [facility] None  07/15/2021  2:00 PM Elder Negus, NP [facility] None    ------------------------------------------------------------------------------------------------------------------   HPI 69 y.o.female presents for evaluation of runny nose that started before christmas.  Then she started to have popping and cracking in her ears wand then pressure behind her ears and behind her eyes. She reports her nose feels like it is going to run but it does not.   Denies any coughing, sore throat, nausea vomiting, diarrhea.    Past Medical History:  Diagnosis Date  . Anemia   . Anxiety   . Arthritis   . Back pain, chronic   . Cancer (HCC)  2 weeks ago    skin cancer  left anterior wrist   and right side of  face     . Depression   . Diverticulosis 09/25/2019  . GERD (gastroesophageal reflux disease)   . Hyperlipidemia   . Hypothyroidism   . Kidney stone    2015  . Thyroid disease   . Umbilical hernia   . Unspecified vitamin D deficiency      Allergies  Allergen Reactions  . Celebrex [Celecoxib] Other (See Comments)    "panic attack"  . Other  Other (See Comments)    Skin bruises very easily and peels back  . Topamax [Topiramate] Other (See Comments)    MEMORY  . Entex Lq [Phenylephrine-Guaifenesin] Rash  . Fiorinal [Butalbital-Aspirin-Caffeine] Rash    Current Outpatient Medications on File Prior to Visit  Medication Sig  . ALPRAZolam (XANAX) 1 MG tablet Take 1/2 to 1 tablet  At Bedtime & please try to limit to 5 days /week to avoid addiction  . Cholecalciferol (VITAMIN D3 PO) Take 5,000 Units by mouth 2 (two) times a day.   . Cyanocobalamin (B-12 PO) Take 1,000 mcg by mouth daily.   . cyclobenzaprine (FLEXERIL) 5 MG tablet Take 5 mg by mouth at bedtime.  Marland Kitchen estradiol (ESTRACE) 1 MG tablet Take 1 mg by mouth daily.  Marland Kitchen levothyroxine (SYNTHROID) 88 MCG tablet Take 1/2 tablet one day a week and 1 tablet all other days.  . Loperamide HCl (IMODIUM PO) Take by mouth.  . Magnesium 500 MG TABS Take 500 mg by mouth 4 (four) times daily.  Marland Kitchen oxybutynin (DITROPAN) 5 MG tablet Take     1 tablet    3 x /day      for Bladder   No current facility-administered medications on file prior to visit.    ROS: all negative except above.   Physical Exam:  BP 118/72   Pulse 74   Temp 97.6 F (36.4 C)   Ht  5\' 1"  (1.549 m)   Wt 135 lb 6.4 oz (61.4 kg)   SpO2 97%   BMI 25.58 kg/m   General Appearance: Well nourished, in no apparent distress. Eyes: PERRLA, EOMs, conjunctiva no swelling or erythema Sinuses: Frontal/maxillary tenderness ENT/Mouth: Ext aud canals clear, TMs exudate noted behind R TM with bulging. No erythema, swelling, or exudate on post pharynx.  Tonsils not swollen or erythematous. Hearing normal.  Neck: Supple, thyroid normal.  Respiratory: Respiratory effort normal, BS equal bilaterally without rales, rhonchi, wheezing or stridor.  Cardio: RRR with no MRGs. Brisk peripheral pulses without edema.  Abdomen: Soft, + BS.  Non tender, no guarding, rebound, hernias, masses. Lymphatics: Non tender without lymphadenopathy.   Musculoskeletal: Full ROM, 5/5 strength, normal gait.  Skin: Warm, dry without rashes, lesions, ecchymosis.  Neuro: Cranial nerves intact. Normal muscle tone, no cerebellar symptoms. Sensation intact.  Psych: Awake and oriented X 3, normal affect, Insight and Judgment appropriate.     , NP 3:41 PM [facility]

## 2020-09-29 ENCOUNTER — Encounter: Payer: Self-pay | Admitting: *Deleted

## 2020-10-01 DIAGNOSIS — R300 Dysuria: Secondary | ICD-10-CM | POA: Diagnosis not present

## 2020-10-01 DIAGNOSIS — N3946 Mixed incontinence: Secondary | ICD-10-CM | POA: Diagnosis not present

## 2020-10-13 DIAGNOSIS — R350 Frequency of micturition: Secondary | ICD-10-CM | POA: Diagnosis not present

## 2020-10-13 DIAGNOSIS — R3915 Urgency of urination: Secondary | ICD-10-CM | POA: Diagnosis not present

## 2020-10-14 ENCOUNTER — Other Ambulatory Visit: Payer: Self-pay | Admitting: Internal Medicine

## 2020-10-14 MED ORDER — PREDNISONE 10 MG PO TABS: ORAL_TABLET | ORAL | 0 refills | Status: DC

## 2020-10-14 MED ORDER — PSEUDOEPHEDRINE HCL ER 120 MG PO TB12
ORAL_TABLET | ORAL | 0 refills | Status: DC
Start: 2020-10-14 — End: 2021-01-27

## 2020-10-28 DIAGNOSIS — R351 Nocturia: Secondary | ICD-10-CM | POA: Diagnosis not present

## 2020-10-28 DIAGNOSIS — N3946 Mixed incontinence: Secondary | ICD-10-CM | POA: Diagnosis not present

## 2020-11-09 DIAGNOSIS — M5416 Radiculopathy, lumbar region: Secondary | ICD-10-CM | POA: Diagnosis not present

## 2020-12-03 DIAGNOSIS — Z981 Arthrodesis status: Secondary | ICD-10-CM | POA: Diagnosis not present

## 2020-12-03 DIAGNOSIS — M5416 Radiculopathy, lumbar region: Secondary | ICD-10-CM | POA: Diagnosis not present

## 2020-12-22 DIAGNOSIS — M5416 Radiculopathy, lumbar region: Secondary | ICD-10-CM | POA: Diagnosis not present

## 2020-12-23 ENCOUNTER — Other Ambulatory Visit: Payer: Self-pay | Admitting: Neurological Surgery

## 2020-12-23 DIAGNOSIS — M5416 Radiculopathy, lumbar region: Secondary | ICD-10-CM

## 2020-12-24 DIAGNOSIS — N3946 Mixed incontinence: Secondary | ICD-10-CM | POA: Diagnosis not present

## 2020-12-24 DIAGNOSIS — R351 Nocturia: Secondary | ICD-10-CM | POA: Diagnosis not present

## 2021-01-11 ENCOUNTER — Ambulatory Visit
Admission: RE | Admit: 2021-01-11 | Discharge: 2021-01-11 | Disposition: A | Discharge status: Home / Self Care | Payer: Medicare PPO | Source: Ambulatory Visit | Attending: Neurological Surgery | Admitting: Neurological Surgery

## 2021-01-11 DIAGNOSIS — M5416 Radiculopathy, lumbar region: Secondary | ICD-10-CM

## 2021-01-11 DIAGNOSIS — M545 Low back pain, unspecified: Secondary | ICD-10-CM | POA: Diagnosis not present

## 2021-01-11 DIAGNOSIS — M48061 Spinal stenosis, lumbar region without neurogenic claudication: Secondary | ICD-10-CM | POA: Diagnosis not present

## 2021-01-12 ENCOUNTER — Other Ambulatory Visit: Payer: Medicare PPO

## 2021-01-14 DIAGNOSIS — Z981 Arthrodesis status: Secondary | ICD-10-CM | POA: Diagnosis not present

## 2021-01-14 DIAGNOSIS — M5416 Radiculopathy, lumbar region: Secondary | ICD-10-CM | POA: Diagnosis not present

## 2021-01-18 ENCOUNTER — Encounter: Payer: Self-pay | Admitting: Dermatology

## 2021-01-18 ENCOUNTER — Other Ambulatory Visit: Payer: Self-pay

## 2021-01-18 ENCOUNTER — Ambulatory Visit: Payer: Medicare PPO | Admitting: Dermatology

## 2021-01-18 DIAGNOSIS — D485 Neoplasm of uncertain behavior of skin: Secondary | ICD-10-CM

## 2021-01-18 DIAGNOSIS — Z1283 Encounter for screening for malignant neoplasm of skin: Secondary | ICD-10-CM

## 2021-01-18 DIAGNOSIS — D0439 Carcinoma in situ of skin of other parts of face: Secondary | ICD-10-CM | POA: Diagnosis not present

## 2021-01-18 DIAGNOSIS — L821 Other seborrheic keratosis: Secondary | ICD-10-CM

## 2021-01-18 DIAGNOSIS — C44619 Basal cell carcinoma of skin of left upper limb, including shoulder: Secondary | ICD-10-CM

## 2021-01-18 DIAGNOSIS — L570 Actinic keratosis: Secondary | ICD-10-CM | POA: Diagnosis not present

## 2021-01-18 NOTE — Patient Instructions (Signed)

## 2021-01-21 ENCOUNTER — Telehealth: Payer: Self-pay | Admitting: *Deleted

## 2021-01-21 NOTE — Telephone Encounter (Signed)
Pathology to patient-surgery appointment scheduled.  

## 2021-01-21 NOTE — Telephone Encounter (Signed)
-----   Message from Stuart Tafeen, MD sent at 01/21/2021  7:18 AM EDT ----- Schedule surgery with Dr. T 

## 2021-01-27 NOTE — Patient Instructions (Signed)

## 2021-01-27 NOTE — Progress Notes (Signed)
Future Appointments  Date Time Provider [place]  01/28/2021  9:30 AM [name], MD [facility] None  03/25/2021  2:00 PM Lavonna Monarch, MD [facility] [place]  07/15/2021  2:00 PM [name], NP [facility] None    History of Present Illness:       This very nice 70 y.o. MWF presents for 3 month follow up with HTN, HLD, Pre-Diabetes, GERD, RLS and Vitamin D Deficiency. Aortic Atherosclerosis was found on CT scan on 09/25/2019.         Patient relates that she's having urinary incontinence and has seen a local urologist, but desires a 2sd opinion specifically requesting a female provider.         Patient has been on Thyroid Replacement since the 1980's       Patient has been followed expectantly since 2008 for labile HTN & BP has been controlled at home. Today's BP is at goal - 124/72. Patient has had no complaints of any cardiac type chest pain, palpitations, dyspnea / orthopnea / PND, dizziness, claudication, or dependent edema.       Hyperlipidemia is not controlled with diet & meds. Patient denies myalgias or other med SE's. Last Lipids were not at goal:  Lab Results  Component Value Date   CHOL 208 (H) 07/15/2020   HDL 78 07/15/2020   LDLCALC 107 (H) 07/15/2020   TRIG 123 07/15/2020   CHOLHDL 2.7 07/15/2020     Also, the patient has been monitored expectantly for abnormal glucose and has had no symptoms of reactive hypoglycemia, diabetic polys, paresthesias or visual blurring.  Last A1c was normal & at goal:  Component Value Date   HGBA1C 5.4 07/15/2020         Further, the patient also has history of Vitamin D Deficiency and supplements vitamin D without any suspected side-effects. Last vitamin D was at goal:  Lab Results  Component Value Date   VD25OH 71 07/15/2020   Current Outpatient Medications on File Prior to Visit  Medication Sig  . ALPRAZolam 1 MG tablet Take 1/2 to 1 tablet  At Bedtime & please try to limit to 5 days /week to avoid  addiction  . amoxicillin-clavulanate  875-125 MG tablet Take 1 tablet  2 times daily. 10 days  . Cholecalciferol (VITAMIN D3 PO) Take 5,000 Units 2  times a day.   . B-121,000 mcg Take  daily.   . cyclobenzaprine  5 MG tablet Take at bedtime.  Marland Kitchen estradiol 1 MG tablet Take daily.  Marland Kitchen levothyroxine  88 MCG tablet Take 1/2 tablet one day a week and 1 tablet all other days.  . Loperamide HCl (IMODIUM PO) Take s directed  . Magnesium 500 MG TABS Take  4  times daily.  Marland Kitchen oxybutynin (DITROPAN) 5 MG tablet Take     1 tablet    3 x /day      for Bladder    Allergies  Allergen Reactions  . Celebrex [Celecoxib] Other (See Comments)    "panic attack"  . Other Other (See Comments)    Skin bruises very easily and peels back  . Topamax [Topiramate] Other (See Comments)    MEMORY  . Entex Lq [Phenylephrine-Guaifenesin] Rash  . Fiorinal [Butalbital-Aspirin-Caffeine] Rash     PMHx:   Past Medical History:  Diagnosis Date  . Anemia   . Anxiety   . Arthritis   . Back pain, chronic   . Cancer ([place])  2 weeks ago  skin cancer  left anterior wrist   and right side of  face     . Depression   . Diverticulosis 09/25/2019  . GERD (gastroesophageal reflux disease)   . Hyperlipidemia   . Hypothyroidism   . Kidney stone    2015  . Thyroid disease   . Umbilical hernia   . Unspecified vitamin D deficiency      Immunization History  Administered Date(s) Administered  . Influenza Split 06/29/2015  . Influenza Whole 05/21/2013  . Influenza, High Dose  05/30/2016, 06/13/2017, 06/18/2018, 06/24/2019, 07/02/2020  . Influenza,inj,quad 06/19/2017  . Influenza-Unspecified 06/09/2014, 06/20/2015  . PPD Test 09/23/2013, 10/01/2014, 02/26/2016  . Pneumococcal Conjugate-13 05/03/2017  . Pneumococcal Polysaccharide-23 09/23/2013  . Pneumococcal-23 06/19/2017  . Td 09/19/2005  . Tdap 10/07/2015  . Zoster 03/27/2014  . Zoster Recombinat (Shingrix) 12/03/2020     Past Surgical History:  Procedure  Laterality Date  . ABDOMINAL HYSTERECTOMY    . ANTERIOR LAT LUMBAR FUSION N/A 04/07/2017   Procedure: LUMBAR ONE-TWO ANTERIOR LATERAL LUMBAR FUSION WITH LATERAL PLATE;  Surgeon: Eustace Moore, MD;  Location: [facility];  Service: Neurosurgery;  Laterality: N/A;  Right side approach  . APPENDECTOMY    . CESAREAN SECTION    . HERNIA REPAIR    . LAPAROSCOPIC LYSIS INTESTINAL ADHESIONS    . MAXIMUM ACCESS (MAS)POSTERIOR LUMBAR INTERBODY FUSION (PLIF) 1 LEVEL N/A 04/16/2015   Procedure: masPLIF  - L2-L3 ;  Surgeon: Eustace Moore, MD;  Location: [facility];  Service: Neurosurgery;  Laterality: N/A;  masPLIF  - L2-L3   . SPINE SURGERY     lumbar  . TONSILLECTOMY AND ADENOIDECTOMY    . TOTAL KNEE ARTHROPLASTY Left 07/17/2018   Procedure: LEFT TOTAL KNEE ARTHROPLASTY;  Surgeon: Paralee Cancel, MD;  Location: [facility];  Service: Orthopedics;  Laterality: Left;  70 mins    FHx:    Reviewed / unchanged  SHx:    Reviewed / unchanged   Systems Review:  Constitutional: Denies fever, chills, wt changes, headaches, insomnia, fatigue, night sweats, change in appetite. Eyes: Denies redness, blurred vision, diplopia, discharge, itchy, watery eyes.  ENT: Denies discharge, congestion, post nasal drip, epistaxis, sore throat, earache, hearing loss, dental pain, tinnitus, vertigo, sinus pain, snoring.  CV: Denies chest pain, palpitations, irregular heartbeat, syncope, dyspnea, diaphoresis, orthopnea, PND, claudication or edema. Respiratory: denies cough, dyspnea, DOE, pleurisy, hoarseness, laryngitis, wheezing.  Gastrointestinal: Denies dysphagia, odynophagia, heartburn, reflux, water brash, abdominal pain or cramps, nausea, vomiting, bloating, diarrhea, constipation, hematemesis, melena, hematochezia  or hemorrhoids. Genitourinary: Denies dysuria, frequency, urgency, nocturia, hesitancy, discharge, hematuria or flank pain. Musculoskeletal: Denies arthralgias, myalgias, stiffness, jt. swelling, pain, limping or  strain/sprain.  Skin: Denies pruritus, rash, hives, warts, acne, eczema or change in skin lesion(s). Neuro: No weakness, tremor, incoordination, spasms, paresthesia or pain. Psychiatric: Denies confusion, memory loss or sensory loss. Endo: Denies change in weight, skin or hair change.  Heme/Lymph: No excessive bleeding, bruising or enlarged lymph nodes.  Physical Exam  BP 124/72   Pulse 75   Temp 97.7 F (36.5 C)   Resp 16   Ht 5\' 1"  (1.549 m)   Wt 135 lb 6.4 oz (61.4 kg)   SpO2 99%   BMI 25.58 kg/m   Appears  well nourished, well groomed  and in no distress.  Eyes: PERRLA, EOMs, conjunctiva no swelling or erythema. Sinuses: No frontal/maxillary tenderness ENT/Mouth: EAC's clear, TM's nl w/o erythema, bulging. Nares clear w/o erythema, swelling, exudates. Oropharynx clear without erythema  or exudates. Oral hygiene is good. Tongue normal, non obstructing. Hearing intact.  Neck: Supple. Thyroid not palpable. Car 2+/2+ without bruits, nodes or JVD. Chest: Respirations nl with BS clear & equal w/o rales, rhonchi, wheezing or stridor.  Cor: Heart sounds normal w/ regular rate and rhythm without sig. murmurs, gallops, clicks or rubs. Peripheral pulses normal and equal  without edema.  Abdomen: Soft & bowel sounds normal. Non-tender w/o guarding, rebound, hernias, masses or organomegaly.  Lymphatics: Unremarkable.  Musculoskeletal: Full ROM all peripheral extremities, joint stability, 5/5 strength and normal gait.  Skin: Warm, dry without exposed rashes, lesions or ecchymosis apparent.  Neuro: Cranial nerves intact, reflexes equal bilaterally. Sensory-motor testing grossly intact. Tendon reflexes grossly intact.  Pysch: Alert & oriented x 3.  Insight and judgement nl & appropriate. No ideations.  Assessment and Plan:  1. Labile hypertension  - Continue medication, monitor blood pressure at home.  - Continue DASH diet.  Reminder to go to the ER if any CP,  SOB, nausea, dizziness,  severe HA, changes vision/speech.  - CBC with Differential/Platelet - COMPLETE METABOLIC PANEL WITH GFR - TSH  2. Hyperlipidemia, mixed  - Continue diet/meds, exercise,& lifestyle modifications.  - Continue monitor periodic cholesterol/liver & renal functions   - Lipid panel - TSH  3. Abnormal glucose  - Continue diet, exercise  - Lifestyle modifications.  - Monitor appropriate labs  - Hemoglobin A1c - Insulin, random  4. Vitamin D deficiency  - Continue supplementation.  - VITAMIN D 25 Hydroxy   5. Hypothyroidism - TSH  6. Aortic atherosclerosis ([place]) by Abd CT Scan 09/25/2019  - Lipid panel  7. Medication management  - CBC with Differential/Platelet - COMPLETE METABOLIC PANEL WITH GFR - Magnesium - Lipid panel - TSH - Hemoglobin A1c - Insulin, random - VITAMIN D 25 Hydroxy          Discussed  regular exercise, BP monitoring, weight control to achieve/maintain BMI less than 25 and discussed med and SE's. Recommended labs to assess and monitor clinical status with further disposition pending results of labs.  I discussed the assessment and treatment plan with the patient. The patient was provided an opportunity to ask questions and all were answered. The patient agreed with the plan and demonstrated an understanding of the instructions.  I provided over 30 minutes of exam, counseling, chart review and  complex critical decision making.         The patient was advised to call back or seek an in-person evaluation if the symptoms worsen or if the condition fails to improve as anticipated.   Kirtland Bouchard, MD

## 2021-01-28 ENCOUNTER — Ambulatory Visit: Payer: Medicare PPO | Admitting: Internal Medicine

## 2021-01-28 ENCOUNTER — Encounter: Payer: Self-pay | Admitting: Internal Medicine

## 2021-01-28 ENCOUNTER — Other Ambulatory Visit: Payer: Self-pay

## 2021-01-28 VITALS — BP 124/72 | HR 75 | Temp 97.7°F | Resp 16 | Ht 61.0 in | Wt 135.4 lb

## 2021-01-28 DIAGNOSIS — N3946 Mixed incontinence: Secondary | ICD-10-CM

## 2021-01-28 DIAGNOSIS — R0989 Other specified symptoms and signs involving the circulatory and respiratory systems: Secondary | ICD-10-CM | POA: Diagnosis not present

## 2021-01-28 DIAGNOSIS — F419 Anxiety disorder, unspecified: Secondary | ICD-10-CM | POA: Diagnosis not present

## 2021-01-28 DIAGNOSIS — E782 Mixed hyperlipidemia: Secondary | ICD-10-CM | POA: Diagnosis not present

## 2021-01-28 DIAGNOSIS — R7309 Other abnormal glucose: Secondary | ICD-10-CM

## 2021-01-28 DIAGNOSIS — Z79899 Other long term (current) drug therapy: Secondary | ICD-10-CM | POA: Diagnosis not present

## 2021-01-28 DIAGNOSIS — E039 Hypothyroidism, unspecified: Secondary | ICD-10-CM

## 2021-01-28 DIAGNOSIS — E559 Vitamin D deficiency, unspecified: Secondary | ICD-10-CM

## 2021-01-28 DIAGNOSIS — I700 Atherosclerosis of aorta: Secondary | ICD-10-CM

## 2021-01-28 DIAGNOSIS — J300 Vasomotor rhinitis: Secondary | ICD-10-CM

## 2021-01-28 MED ORDER — IPRATROPIUM BROMIDE 0.06 % NA SOLN: NASAL | 3 refills | Status: DC

## 2021-01-28 MED ORDER — ALPRAZOLAM 1 MG PO TABS: ORAL_TABLET | ORAL | 0 refills | Status: DC

## 2021-01-29 LAB — COMPLETE METABOLIC PANEL WITH GFR
AG Ratio: 1.8 (calc) (ref 1.0–2.5)
ALT: 21 U/L (ref 6–29)
AST: 28 U/L (ref 10–35)
Albumin: 4.2 g/dL (ref 3.6–5.1)
Alkaline phosphatase (APISO): 52 U/L (ref 37–153)
BUN: 11 mg/dL (ref 7–25)
CO2: 28 mmol/L (ref 20–32)
Calcium: 9.5 mg/dL (ref 8.6–10.4)
Chloride: 105 mmol/L (ref 98–110)
Creat: 0.63 mg/dL (ref 0.60–0.93)
GFR, Est African American: 105 mL/min/{1.73_m2} (ref >=60)
GFR, Est Non African American: 91 mL/min/{1.73_m2} (ref >=60)
Globulin: 2.4 g/dL (calc) (ref 1.9–3.7)
Glucose, Bld: 91 mg/dL (ref 65–99)
Potassium: 4.1 mmol/L (ref 3.5–5.3)
Sodium: 140 mmol/L (ref 135–146)
Total Bilirubin: 0.5 mg/dL (ref 0.2–1.2)
Total Protein: 6.6 g/dL (ref 6.1–8.1)

## 2021-01-29 LAB — LIPID PANEL
Cholesterol: 190 mg/dL (ref <200)
HDL: 68 mg/dL (ref >=50)
LDL Cholesterol (Calc): 104 mg/dL (calc) — ABNORMAL HIGH
Non-HDL Cholesterol (Calc): 122 mg/dL (calc) (ref <130)
Total CHOL/HDL Ratio: 2.8 (calc) (ref <5.0)
Triglycerides: 86 mg/dL (ref <150)

## 2021-01-29 LAB — CBC WITH DIFFERENTIAL/PLATELET
Absolute Monocytes: 419 cells/uL (ref 200–950)
Basophils Absolute: 58 cells/uL (ref 0–200)
Basophils Relative: 1.1 %
Eosinophils Absolute: 191 cells/uL (ref 15–500)
Eosinophils Relative: 3.6 %
HCT: 37.8 % (ref 35.0–45.0)
Hemoglobin: 12.7 g/dL (ref 11.7–15.5)
Lymphs Abs: 2284 cells/uL (ref 850–3900)
MCH: 30.3 pg (ref 27.0–33.0)
MCHC: 33.6 g/dL (ref 32.0–36.0)
MCV: 90.2 fL (ref 80.0–100.0)
MPV: 8.8 fL (ref 7.5–12.5)
Monocytes Relative: 7.9 %
Neutro Abs: 2348 cells/uL (ref 1500–7800)
Neutrophils Relative %: 44.3 %
Platelets: 331 10*3/uL (ref 140–400)
RBC: 4.19 10*6/uL (ref 3.80–5.10)
RDW: 12.2 % (ref 11.0–15.0)
Total Lymphocyte: 43.1 %
WBC: 5.3 10*3/uL (ref 3.8–10.8)

## 2021-01-29 LAB — INSULIN, RANDOM: Insulin: 7.5 u[IU]/mL

## 2021-01-29 LAB — TSH: TSH: 0.26 mIU/L — ABNORMAL LOW (ref 0.40–4.50)

## 2021-01-29 LAB — HEMOGLOBIN A1C
Hgb A1c MFr Bld: 5.3 % of total Hgb (ref <5.7)
Mean Plasma Glucose: 105 mg/dL
eAG (mmol/L): 5.8 mmol/L

## 2021-01-29 LAB — VITAMIN D 25 HYDROXY (VIT D DEFICIENCY, FRACTURES): Vit D, 25-Hydroxy: 78 ng/mL (ref 30–100)

## 2021-01-29 LAB — MAGNESIUM: Magnesium: 1.9 mg/dL (ref 1.5–2.5)

## 2021-01-30 ENCOUNTER — Encounter: Payer: Self-pay | Admitting: Dermatology

## 2021-01-30 NOTE — Progress Notes (Signed)
   Follow-Up Visit   Subjective  Monique Thomas is a 70 y.o. female who presents for the following: Skin Problem (Check places on nose, right cheek and right arm- both places on face itch and bleed).  Several new lesions to be checked Location:  Duration:  Quality:  Associated Signs/Symptoms: Modifying Factors:  Severity:  Timing: Context:   Objective  Well appearing patient in no apparent distress; mood and affect are within normal limits. Objective  Left Forearm - Posterior: 1.3 cm waxy lesion, rule out superficial SCCA     Objective  Right Tip of Nose: 6 mm flat waxy papule, rule out BCC     Objective  Left Forearm - Posterior, Right Buccal Cheek, Right Dorsal Hand (2), Right Malar Cheek: Hornlike 2 to 4 mm pink crusts  Objective  Right Upper Arm - Anterior: Flattopped tan 7 mm textured papule  Objective  Left Upper Back: Waist up skin examination, no atypical pigmented lesions    All skin waist up examined.  Areas beneath undergarments not fully examined.   Assessment & Plan    Neoplasm of uncertain behavior of skin (2) Left Forearm - Posterior  Skin / nail biopsy Type of biopsy: tangential   Informed consent: discussed and consent obtained   Timeout: patient name, date of birth, surgical site, and procedure verified   Procedure prep:  Patient was prepped and draped in usual sterile fashion (Non sterile) Prep type:  Chlorhexidine Anesthesia: the lesion was anesthetized in a standard fashion   Anesthetic:  1% lidocaine w/ epinephrine 1-100,000 local infiltration Instrument used: flexible razor blade   Outcome: patient tolerated procedure well   Post-procedure details: wound care instructions given    Specimen 1 - Surgical pathology Differential Diagnosis: bcc vs scc  Check Margins: No  Right Tip of Nose  Skin / nail biopsy Type of biopsy: tangential   Informed consent: discussed and consent obtained   Timeout: patient name, date of birth,  surgical site, and procedure verified   Procedure prep:  Patient was prepped and draped in usual sterile fashion (Non sterile) Prep type:  Chlorhexidine Anesthesia: the lesion was anesthetized in a standard fashion   Anesthetic:  1% lidocaine w/ epinephrine 1-100,000 local infiltration Instrument used: flexible razor blade   Outcome: patient tolerated procedure well   Post-procedure details: wound care instructions given    Specimen 2 - Surgical pathology Differential Diagnosis: bcc vs scc  Check Margins: No  AK (actinic keratosis) (5) Left Forearm - Posterior; Right Dorsal Hand (2); Right Malar Cheek; Right Buccal Cheek  Destruction of lesion - Left Forearm - Posterior, Right Buccal Cheek, Right Dorsal Hand, Right Malar Cheek Complexity: simple   Destruction method: cryotherapy   Informed consent: discussed and consent obtained   Timeout:  patient name, date of birth, surgical site, and procedure verified Lesion destroyed using liquid nitrogen: Yes   Cryotherapy cycles:  3 Outcome: patient tolerated procedure well with no complications    Seborrheic keratosis Right Upper Arm - Anterior  Leave if stable  Encounter for screening for malignant neoplasm of skin Left Upper Back  Annual skin examination, encouraged to self examine twice annually.      I, Lavonna Monarch, MD, have reviewed all documentation for this visit.  The documentation on 01/30/21 for the exam, diagnosis, procedures, and orders are all accurate and complete.

## 2021-01-30 NOTE — Progress Notes (Signed)
============================================================ ============================================================  -    Total Chol = 190 - Great  ============================================================ ============================================================  -  Thyroid is borderline abnormal - Be sure not taking any supplements with                                        Biotin which can give incorrect thyroid measurements  ============================================================ ============================================================  -  A1c - Normal - Great no Diabetes ! ============================================================ ============================================================  -  Vitamin D = 78 - Excellent  ============================================================ ============================================================  -  All Else - CBC - Kidneys - Electrolytes - Liver  &  Magnesium   - all  Normal / OK ============================================================  ============================================================

## 2021-02-01 NOTE — Progress Notes (Signed)
Lvm to call office

## 2021-02-18 ENCOUNTER — Ambulatory Visit: Payer: Self-pay | Admitting: Urology

## 2021-02-25 DIAGNOSIS — R3915 Urgency of urination: Secondary | ICD-10-CM | POA: Diagnosis not present

## 2021-02-25 DIAGNOSIS — N393 Stress incontinence (female) (male): Secondary | ICD-10-CM | POA: Diagnosis not present

## 2021-02-25 DIAGNOSIS — N3946 Mixed incontinence: Secondary | ICD-10-CM | POA: Diagnosis not present

## 2021-02-25 DIAGNOSIS — N3941 Urge incontinence: Secondary | ICD-10-CM | POA: Diagnosis not present

## 2021-03-15 ENCOUNTER — Ambulatory Visit: Payer: Medicare PPO | Admitting: Nurse Practitioner

## 2021-03-15 ENCOUNTER — Encounter: Payer: Self-pay | Admitting: Nurse Practitioner

## 2021-03-15 ENCOUNTER — Other Ambulatory Visit: Payer: Self-pay

## 2021-03-15 VITALS — BP 110/58 | HR 76 | Temp 97.7°F | Resp 16 | Ht 61.0 in | Wt 135.8 lb

## 2021-03-15 DIAGNOSIS — I10 Essential (primary) hypertension: Secondary | ICD-10-CM

## 2021-03-15 DIAGNOSIS — S70362A Insect bite (nonvenomous), left thigh, initial encounter: Secondary | ICD-10-CM | POA: Diagnosis not present

## 2021-03-15 DIAGNOSIS — W57XXXA Bitten or stung by nonvenomous insect and other nonvenomous arthropods, initial encounter: Secondary | ICD-10-CM

## 2021-03-15 MED ORDER — DOXYCYCLINE HYCLATE 100 MG PO CAPS: ORAL_CAPSULE | ORAL | 0 refills | Status: DC

## 2021-03-15 NOTE — Progress Notes (Signed)
Assessment and Plan  Pt was seen today for evaluation of insect bite which has been present X 2 days.  Diagnoses and all orders for this visit:  Insect bite of left thigh with local reaction, initial encounter -  doxycycline (VIBRAMYCIN) 100 MG capsule; 1 tab PO BID X 10 days   - Monitor area and if redness passes line drawn on skin today she is to call office for reevalutaion.       Further disposition pending results of labs. Discussed med's effects and SE's.   Over 20 minutes of face to face interview, exam, counseling, chart review, and critical decision making was performed.   Future Appointments  Date Time Provider [place]  03/25/2021  2:00 PM Lavonna Monarch, MD [facility]  07/26/2021  9:00 AM Liane Comber, NP [facility] None    ------------------------------------------------------------------------------------------------------------------   HPI Althia was seen today for insect bite L outer thigh which was found 48 hours ago- pt states she noticed it when she sat down on toilet and "got legs but no body"and was afraid it was an imbedded tick.  The area is itchy at site, reddened with no pain, fever or myalgias Past Medical History:  Diagnosis Date   Anemia    Anxiety    Arthritis    Back pain, chronic    Cancer ([facility])  2 weeks ago    skin cancer  left anterior wrist   and right side of  face      Depression    Diverticulosis 09/25/2019   GERD (gastroesophageal reflux disease)    Hyperlipidemia    Hypothyroidism    Kidney stone    2015   Thyroid disease    Umbilical hernia    Unspecified vitamin D deficiency      Allergies  Allergen Reactions   Celebrex [Celecoxib] Other (See Comments)    "panic attack"   Other Other (See Comments)    Skin bruises very easily and peels back   Topamax [Topiramate] Other (See Comments)    MEMORY   Entex Lq [Phenylephrine-Guaifenesin] Rash   Fiorinal [Butalbital-Aspirin-Caffeine] Rash    Current Outpatient  Medications on File Prior to Visit  Medication Sig   ALPRAZolam (XANAX) 1 MG tablet Take 1/2 to 1 tablet  At Bedtime & please try to limit to 5 days /week to avoid addiction   Cholecalciferol (VITAMIN D3 PO) Take 5,000 Units by mouth 2 (two) times a day.    Cyanocobalamin (B-12 PO) Take 1,000 mcg by mouth daily.    cyclobenzaprine (FLEXERIL) 5 MG tablet Take 5 mg by mouth at bedtime.   estradiol (ESTRACE) 1 MG tablet Take 1 mg by mouth daily.   ipratropium (ATROVENT) 0.06 % nasal spray Use 1 to 2 sprays each Nostril 2 to 3 x /day   levothyroxine (SYNTHROID) 88 MCG tablet Take 1/2 tablet one day a week and 1 tablet all other days.   Magnesium 500 MG TABS Take 500 mg by mouth 4 (four) times daily.   Loperamide HCl (IMODIUM PO) Take by mouth. (Patient not taking: Reported on 03/15/2021)   No current facility-administered medications on file prior to visit.    ROS: all negative except above.   Physical Exam:  BP (!) 110/58 (BP Location: Right Arm, Patient Position: Sitting, Cuff Size: Normal)   Pulse 76   Temp 97.7 F (36.5 C)   Resp 16   Ht 5\' 1"  (1.549 m)   Wt 135 lb 12.8 oz (61.6 kg)   SpO2 99%  BMI 25.66 kg/m   General Appearance: Well nourished, in no apparent distress. ENT/Mouth: Ext aud canals clear, TMs without erythema, bulging. No erythema, swelling, or exudate on post pharynx.  Tonsils not swollen or erythematous. Hearing normal.  Neck: Supple, thyroid normal.  Respiratory: Respiratory effort normal, BS equal bilaterally without rales, rhonchi, wheezing or stridor.  Cardio: RRR with no MRGs. Brisk peripheral pulses without edema.  Lymphatics: Non tender without lymphadenopathy.  Musculoskeletal: Full ROM, 5/5 strength, normal gait.  Skin: 1cm red hardened area on outer aspect of left thigh, serous drainage can be expressed , has approx 2 ch erythematous area surrounding, no bulls eye rash noted. Psych: Awake and oriented X 3, normal affect, Insight and Judgment  appropriate.     Magda Bernheim, NP 3:07 PM [facility]

## 2021-03-24 ENCOUNTER — Other Ambulatory Visit: Payer: Self-pay

## 2021-03-24 ENCOUNTER — Ambulatory Visit (INDEPENDENT_AMBULATORY_CARE_PROVIDER_SITE_OTHER): Payer: Medicare PPO | Admitting: Dermatology

## 2021-03-24 ENCOUNTER — Encounter: Payer: Self-pay | Admitting: Dermatology

## 2021-03-24 DIAGNOSIS — D0462 Carcinoma in situ of skin of left upper limb, including shoulder: Secondary | ICD-10-CM

## 2021-03-24 DIAGNOSIS — L570 Actinic keratosis: Secondary | ICD-10-CM | POA: Diagnosis not present

## 2021-03-24 DIAGNOSIS — C44619 Basal cell carcinoma of skin of left upper limb, including shoulder: Secondary | ICD-10-CM

## 2021-03-24 DIAGNOSIS — D485 Neoplasm of uncertain behavior of skin: Secondary | ICD-10-CM

## 2021-03-24 NOTE — Patient Instructions (Signed)

## 2021-03-25 ENCOUNTER — Encounter: Payer: Medicare PPO | Admitting: Dermatology

## 2021-03-26 DIAGNOSIS — N3946 Mixed incontinence: Secondary | ICD-10-CM | POA: Diagnosis not present

## 2021-03-26 DIAGNOSIS — N952 Postmenopausal atrophic vaginitis: Secondary | ICD-10-CM | POA: Diagnosis not present

## 2021-03-26 DIAGNOSIS — R351 Nocturia: Secondary | ICD-10-CM | POA: Diagnosis not present

## 2021-04-01 ENCOUNTER — Telehealth: Payer: Self-pay | Admitting: *Deleted

## 2021-04-01 NOTE — Telephone Encounter (Signed)
Path to patient. Made surgery appointment with Dr.Tafeen.  

## 2021-04-01 NOTE — Telephone Encounter (Signed)
-----   Message from Lavonna Monarch, MD sent at 04/01/2021  6:34 AM EDT ----- Schedule surgery with Dr. Darene Lamer

## 2021-04-08 ENCOUNTER — Encounter: Payer: Self-pay | Admitting: Dermatology

## 2021-04-08 NOTE — Progress Notes (Signed)
   Follow-Up Visit   Subjective  Monique Thomas is a 70 y.o. female who presents for the following: Follow-up (Left forearm & nose- LN2 last office visit).  Recheck Frozen spots on arm and nose, did not completely clear +1 other crust on arm Location:  Duration:  Quality:  Associated Signs/Symptoms: Modifying Factors:  Severity:  Timing: Context:   Objective  Well appearing patient in no apparent distress; mood and affect are within normal limits. right tip of nose Freeze failure waxy 4 mm crust       Left Forearm - Posterior Freeze failure waxy 1 cm crust       Left Upper Arm - Posterior 1 cm focally eroded crust       A focused examination was performed including head, neck, arms.. Relevant physical exam findings are noted in the Assessment and Plan.   Assessment & Plan    Neoplasm of uncertain behavior of skin (3) right tip of nose  Skin / nail biopsy Type of biopsy: tangential   Informed consent: discussed and consent obtained   Timeout: patient name, date of birth, surgical site, and procedure verified   Procedure prep:  Patient was prepped and draped in usual sterile fashion (Non sterile) Prep type:  Chlorhexidine Anesthesia: the lesion was anesthetized in a standard fashion   Anesthetic:  1% lidocaine w/ epinephrine 1-100,000 local infiltration Instrument used: flexible razor blade   Outcome: patient tolerated procedure well   Post-procedure details: wound care instructions given    Specimen 1 - Surgical pathology Differential Diagnosis bcc vs scc  Check Margins: No  Left Forearm - Posterior  Skin / nail biopsy Type of biopsy: tangential   Informed consent: discussed and consent obtained   Timeout: patient name, date of birth, surgical site, and procedure verified   Procedure prep:  Patient was prepped and draped in usual sterile fashion (Non sterile) Prep type:  Chlorhexidine Anesthesia: the lesion was anesthetized in a standard  fashion   Anesthetic:  1% lidocaine w/ epinephrine 1-100,000 local infiltration Instrument used: flexible razor blade   Outcome: patient tolerated procedure well   Post-procedure details: wound care instructions given    Specimen 2 - Surgical pathology Differential Diagnosis: bcc vs scc  Check Margins: No  Left Upper Arm - Posterior  Skin / nail biopsy Type of biopsy: tangential   Informed consent: discussed and consent obtained   Timeout: patient name, date of birth, surgical site, and procedure verified   Procedure prep:  Patient was prepped and draped in usual sterile fashion (Non sterile) Prep type:  Chlorhexidine Anesthesia: the lesion was anesthetized in a standard fashion   Anesthetic:  1% lidocaine w/ epinephrine 1-100,000 local infiltration Instrument used: flexible razor blade   Outcome: patient tolerated procedure well   Post-procedure details: wound care instructions given    Specimen 3 - Surgical pathology Differential Diagnosis: bcc vs scc, atypia  Check Margins: No      I, Lavonna Monarch, MD, have reviewed all documentation for this visit.  The documentation on 04/08/21 for the exam, diagnosis, procedures, and orders are all accurate and complete.

## 2021-04-10 ENCOUNTER — Other Ambulatory Visit: Payer: Self-pay | Admitting: Internal Medicine

## 2021-04-10 DIAGNOSIS — F419 Anxiety disorder, unspecified: Secondary | ICD-10-CM

## 2021-04-20 ENCOUNTER — Other Ambulatory Visit: Payer: Self-pay | Admitting: Internal Medicine

## 2021-04-20 MED ORDER — ROPINIROLE HCL 5 MG PO TABS: ORAL_TABLET | ORAL | 0 refills | Status: DC

## 2021-05-20 ENCOUNTER — Other Ambulatory Visit: Payer: Self-pay

## 2021-05-20 ENCOUNTER — Encounter: Payer: Self-pay | Admitting: Nurse Practitioner

## 2021-05-20 ENCOUNTER — Ambulatory Visit: Payer: Medicare PPO | Admitting: Nurse Practitioner

## 2021-05-20 VITALS — BP 124/64 | HR 81 | Temp 97.9°F | Wt 134.0 lb

## 2021-05-20 DIAGNOSIS — J0100 Acute maxillary sinusitis, unspecified: Secondary | ICD-10-CM | POA: Diagnosis not present

## 2021-05-20 DIAGNOSIS — H6501 Acute serous otitis media, right ear: Secondary | ICD-10-CM

## 2021-05-20 DIAGNOSIS — Z1152 Encounter for screening for COVID-19: Secondary | ICD-10-CM

## 2021-05-20 DIAGNOSIS — G2581 Restless legs syndrome: Secondary | ICD-10-CM | POA: Diagnosis not present

## 2021-05-20 LAB — POC COVID19 BINAXNOW: SARS Coronavirus 2 Ag: NEGATIVE

## 2021-05-20 MED ORDER — AMOXICILLIN 500 MG PO TABS: 500.0000 mg | ORAL_TABLET | Freq: Three times a day (TID) | ORAL | 0 refills | Status: DC

## 2021-05-20 NOTE — Progress Notes (Signed)
Assessment and Plan:  Monique Thomas was seen today for sinus problem.  Diagnoses and all orders for this visit:  Encounter for screening for COVID-19 -     POC COVID-19  Acute maxillary sinusitis, recurrence not specified -     amoxicillin (AMOXIL) 500 MG tablet; Take 1 tablet (500 mg total) by mouth 3 (three) times daily. 10 days  Right acute serous otitis media, recurrence not specified -     amoxicillin (AMOXIL) 500 MG tablet; Take 1 tablet (500 mg total) by mouth 3 (three) times daily. 10 days - Use Mucinex DM q 12 hours as needed  Restless legs   Using 1/2 tab of 5 mg, the 5 mg was too strong and made pt too lethargic.  She is not needing to use Xanax at night and restless leg symptoms have improved    Further disposition pending results of labs. Discussed med's effects and SE's.   Over 20 minutes of exam, counseling, chart review, and critical decision making was performed.   Future Appointments  Date Time Provider [place]  06/24/2021  9:30 AM Lavonna Monarch, MD [facility]  07/27/2021  9:00 AM Liane Comber, NP [facility] None    ------------------------------------------------------------------------------------------------------------------   HPI BP 124/64   Pulse 81   Temp 97.9 F (36.6 C)   Wt 134 lb (60.8 kg)   SpO2 96%   BMI 25.32 kg/m  70 y.o.female presents for Congestion.  Pt started with sinus congestion, ear pain bilaterally and sinus pain bilaterally.  Denies fever, cough, sore throat, no different myalgias than normal.   Pt is using requip but the '5mg'$  was too strong was lethargic for 2 days.  Using 1/2 dose and is working well for restless leg and is not needing to use Xanax for sleep.    Past Medical History:  Diagnosis Date   Anemia    Anxiety    Arthritis    Back pain, chronic    Cancer (HCC)  2 weeks ago    skin cancer  left anterior wrist   and right side of  face      Depression    Diverticulosis 09/25/2019   GERD (gastroesophageal  reflux disease)    Hyperlipidemia    Hypothyroidism    Kidney stone    2015   Thyroid disease    Umbilical hernia    Unspecified vitamin D deficiency      Allergies  Allergen Reactions   Celebrex [Celecoxib] Other (See Comments)    "panic attack"   Other Other (See Comments)    Skin bruises very easily and peels back   Topamax [Topiramate] Other (See Comments)    MEMORY   Entex Lq [Phenylephrine-Guaifenesin] Rash   Fiorinal [Butalbital-Aspirin-Caffeine] Rash    Current Outpatient Medications on File Prior to Visit  Medication Sig   ALPRAZolam (XANAX) 1 MG tablet TAKE 1/2 TO 1 TABLET BY MOUTH EVERY NIGHT AT BEDTIME. LIMIT TO 5 DAYS PER WEEK TO AVOID ADDICTION   Cholecalciferol (VITAMIN D3 PO) Take 5,000 Units by mouth 2 (two) times a day.    Cyanocobalamin (B-12 PO) Take 1,000 mcg by mouth daily.    cyclobenzaprine (FLEXERIL) 5 MG tablet Take 5 mg by mouth at bedtime.   estradiol (ESTRACE) 1 MG tablet Take 1 mg by mouth daily.   ipratropium (ATROVENT) 0.06 % nasal spray Use 1 to 2 sprays each Nostril 2 to 3 x /day   levothyroxine (SYNTHROID) 88 MCG tablet Take 1/2 tablet one day a week and  1 tablet all other days.   Magnesium 500 MG TABS Take 500 mg by mouth 4 (four) times daily. Taking 2 a day   ropinirole (REQUIP) 5 MG tablet Take  1 tablet  1 hour  before  Bedtime for Restless Legs (Patient taking differently: Is taking 1/2 a pill at bedtime)   doxycycline (VIBRAMYCIN) 100 MG capsule 1 tab PO BID X 10 days   Loperamide HCl (IMODIUM PO) Take by mouth.   No current facility-administered medications on file prior to visit.    ROS: all negative except above.   Physical Exam:  BP 124/64   Pulse 81   Temp 97.9 F (36.6 C)   Wt 134 lb (60.8 kg)   SpO2 96%   BMI 25.32 kg/m   General Appearance: Well nourished, in no apparent distress. Eyes: PERRLA, EOMs, conjunctiva no swelling or erythema Sinuses: positive maxillary tenderness bilaterally ENT/Mouth: Ext aud canals  clear, right TM bulging serous discharge noted.  No erythema, swelling, or exudate on post pharynx.  Tonsils not swollen or erythematous. Hearing normal.  Neck: Supple, thyroid normal.  Respiratory: Respiratory effort normal, BS equal bilaterally without rales, rhonchi, wheezing or stridor.  Cardio: RRR with no MRGs. Brisk peripheral pulses without edema.  Abdomen: Soft, + BS.  Non tender, no guarding, rebound, hernias, masses. Lymphatics:positive submandibular adenopathy  Musculoskeletal: Full ROM, 5/5 strength, normal gait.  Skin: Warm, dry without rashes, lesions, ecchymosis.  Neuro: Cranial nerves intact. Normal muscle tone, no cerebellar symptoms. Sensation intact.  Psych: Awake and oriented X 3, normal affect, Insight and Judgment appropriate.     Magda Bernheim, NP 11:23 AM [facility]

## 2021-05-20 NOTE — Patient Instructions (Addendum)
Mucinex DM every 12 hours as needed Amoxicillin '500mg'$  three times a day for 10 days.  Sinusitis, Adult Sinusitis is inflammation of your sinuses. Sinuses are holl ow spaces in the bones around your face. Your sinuses are located: Around your eyes. In the middle of your forehead. Behind your nose. In your cheekbones. Mucus normally drains out of your sinuses. When your nasal tissues become inflamed or swollen, mucus can become trapped or blocked. This allows bacteria, viruses, and fungi to grow, which leads to infection. Most infections of the sinuses are caused by a virus. Sinusitis can develop quickly. It can last for up to 4 weeks (acute) or for more than 12 weeks (chronic). Sinusitis often develops after a cold. What are the causes? This condition is caused by anything that creates swelling in the sinuses or stops mucus from draining. This includes: Allergies. Asthma. Infection from bacteria or viruses. Deformities or blockages in your nose or sinuses. Abnormal growths in the nose (nasal polyps). Pollutants, such as chemicals or irritants in the air. Infection from fungi (rare). What increases the risk? You are more likely to develop this condition if you: Have a weak body defense system (immune system). Do a lot of swimming or diving. Overuse nasal sprays. Smoke. What are the signs or symptoms? The main symptoms of this condition are pain and a feeling of pressure around the affected sinuses. Other symptoms include: Stuffy nose or congestion. Thick drainage from your nose. Swelling and warmth over the affected sinuses. Headache. Upper toothache. A cough that may get worse at night. Extra mucus that collects in the throat or the back of the nose (postnasal drip). Decreased sense of smell and taste. Fatigue. A fever. Sore throat. Bad breath. How is this diagnosed? This condition is diagnosed based on: Your symptoms. Your medical history. A physical exam. Tests to find  out if your condition is acute or chronic. This may include: Checking your nose for nasal polyps. Viewing your sinuses using a device that has a light (endoscope). Testing for allergies or bacteria. Imaging tests, such as an MRI or CT scan. In rare cases, a bone biopsy may be done to rule out more serious types of fungal sinus disease. How is this treated? Treatment for sinusitis depends on the cause and whether your condition is chronic or acute. If caused by a virus, your symptoms should go away on their own within 10 days. You may be given medicines to relieve symptoms. They include: Medicines that shrink swollen nasal passages (topical intranasal decongestants). Medicines that treat allergies (antihistamines). A spray that eases inflammation of the nostrils (topical intranasal corticosteroids). Rinses that help get rid of thick mucus in your nose (nasal saline washes). If caused by bacteria, your health care provider may recommend waiting to see if your symptoms improve. Most bacterial infections will get better without antibiotic medicine. You may be given antibiotics if you have: A severe infection. A weak immune system. If caused by narrow nasal passages or nasal polyps, you may need to have surgery. Follow these instructions at home: Medicines Take, use, or apply over-the-counter and prescription medicines only as told by your health care provider. These may include nasal sprays. If you were prescribed an antibiotic medicine, take it as told by your health care provider. Do not stop taking the antibiotic even if you start to feel better. Hydrate and humidify  Drink enough fluid to keep your urine pale yellow. Staying hydrated will help to thin your mucus. Use a cool mist  humidifier to keep the humidity level in your home above 50%. Inhale steam for 10-15 minutes, 3-4 times a day, or as told by your health care provider. You can do this in the bathroom while a hot shower is  running. Limit your exposure to cool or dry air. Rest Rest as much as possible. Sleep with your head raised (elevated). Make sure you get enough sleep each night. General instructions  Apply a warm, moist washcloth to your face 3-4 times a day or as told by your health care provider. This will help with discomfort. Wash your hands often with soap and water to reduce your exposure to germs. If soap and water are not available, use hand sanitizer. Do not smoke. Avoid being around people who are smoking (secondhand smoke). Keep all follow-up visits as told by your health care provider. This is important. Contact a health care provider if: You have a fever. Your symptoms get worse. Your symptoms do not improve within 10 days. Get help right away if: You have a severe headache. You have persistent vomiting. You have severe pain or swelling around your face or eyes. You have vision problems. You develop confusion. Your neck is stiff. You have trouble breathing. Summary Sinusitis is soreness and inflammation of your sinuses. Sinuses are hollow spaces in the bones around your face. This condition is caused by nasal tissues that become inflamed or swollen. The swelling traps or blocks the flow of mucus. This allows bacteria, viruses, and fungi to grow, which leads to infection. If you were prescribed an antibiotic medicine, take it as told by your health care provider. Do not stop taking the antibiotic even if you start to feel better. Keep all follow-up visits as told by your health care provider. This is important. This information is not intended to replace advice given to you by your health care provider. Make sure you discuss any questions you have with your health care provider. Document Revised: 02/05/2018 Document Reviewed: 02/05/2018 Elsevier Patient Education  2022 [employer].

## 2021-06-01 ENCOUNTER — Telehealth: Payer: Self-pay

## 2021-06-01 ENCOUNTER — Other Ambulatory Visit: Payer: Self-pay | Admitting: Nurse Practitioner

## 2021-06-01 DIAGNOSIS — J0100 Acute maxillary sinusitis, unspecified: Secondary | ICD-10-CM

## 2021-06-01 MED ORDER — PREDNISONE 20 MG PO TABS: ORAL_TABLET | ORAL | 0 refills | Status: DC

## 2021-06-01 NOTE — Telephone Encounter (Signed)
Patient called and said she is still having headaches and pressure. What else can she do? I talked to [place] and she said to try mucinex, sudafed, or dayquil. And ask her if she is on allergy medication.  I called patient back and she said mucinex did not help at all and she is currently taking allergy medication. She has also tried server sinus and headache that is compared to sudafed that did not help. Patient stated Dr. Melford Aase has called her in prednisone in the past.

## 2021-06-01 NOTE — Telephone Encounter (Signed)
Prednisone sent in.

## 2021-06-17 DIAGNOSIS — N3642 Intrinsic sphincter deficiency (ISD): Secondary | ICD-10-CM | POA: Diagnosis not present

## 2021-06-24 ENCOUNTER — Ambulatory Visit (INDEPENDENT_AMBULATORY_CARE_PROVIDER_SITE_OTHER): Payer: Medicare PPO | Admitting: Dermatology

## 2021-06-24 ENCOUNTER — Other Ambulatory Visit: Payer: Self-pay

## 2021-06-24 DIAGNOSIS — C44612 Basal cell carcinoma of skin of right upper limb, including shoulder: Secondary | ICD-10-CM

## 2021-06-24 DIAGNOSIS — D099 Carcinoma in situ, unspecified: Secondary | ICD-10-CM

## 2021-06-24 NOTE — Patient Instructions (Signed)
Biopsy, Surgery (Curettage) & Surgery (Excision) Aftercare Instructions  1. Okay to remove bandage in 24 hours  2. Wash area with soap and water  3. Apply Vaseline to area twice daily until healed (Not Neosporin)  4. Okay to cover with a Band-Aid to decrease the chance of infection or prevent irritation from clothing; also it's okay to uncover lesion at home.  5. Suture instructions: return to our office in 7-10 or 10-14 days for a nurse visit for suture removal. Variable healing with sutures, if pain or itching occurs call our office. It's okay to shower or bathe 24 hours after sutures are given.  6. The following risks may occur after a biopsy, curettage or excision: bleeding, scarring, discoloration, recurrence, infection (redness, yellow drainage, pain or swelling).  7. For questions, concerns and results call our office at [place] before 4pm & Friday before 3pm. Biopsy results will be available in 1 week. Biopsy, Surgery (Curettage) & Surgery (Excision) Aftercare Instructions  1. Okay to remove bandage in 24 hours  2. Wash area with soap and water  3. Apply Vaseline to area twice daily until healed (Not Neosporin)  4. Okay to cover with a Band-Aid to decrease the chance of infection or prevent irritation from clothing; also it's okay to uncover lesion at home.  5. Suture instructions: return to our office in 7-10 or 10-14 days for a nurse visit for suture removal. Variable healing with sutures, if pain or itching occurs call our office. It's okay to shower or bathe 24 hours after sutures are given.  6. The following risks may occur after a biopsy, curettage or excision: bleeding, scarring, discoloration, recurrence, infection (redness, yellow drainage, pain or swelling).  7. For questions, concerns and results call our office at [place] before 4pm & Friday before 3pm. Biopsy results will be available in 1 week.

## 2021-06-29 NOTE — Progress Notes (Signed)
Assessment and Plan: Monique Thomas was seen today for acute visit.  Diagnoses and all orders for this visit:  S/P lumbar fusion  Pain persists in lower back , uses Lidoderm patches and Flexeril  Hypothyroidism, unspecified type -     TSH Currently on Levothyroxine 88 mcg QD, continue dosage until labs return  Neuropathy -     DULoxetine (CYMBALTA) 20 MG capsule; Take 1 capsule (20 mg total) by mouth daily. -     CBC with Differential/Platelet -     COMPLETE METABOLIC PANEL WITH GFR -     Magnesium  If symptoms are not improving with use of Cymbalta will refer to neurology for nerve conduction study  Leg cramps -     Magnesium Continue to take magnesium daily  Hyperlipidemia, mixed -     Lipid panel  Continue low saturated fat diet and exercise  Abnormal glucose -     Hemoglobin A1c  Continue diet and exercise   Flu vaccine need  High dose glu vaccine given    Further disposition pending results of labs. Discussed med's effects and SE's.   Over 30 minutes of exam, counseling, chart review, and critical decision making was performed.   Future Appointments  Date Time Provider [place]  07/27/2021  9:00 AM Liane Comber, NP [facility] None  11/01/2021  9:45 AM Lavonna Monarch, MD [facility]    ------------------------------------------------------------------------------------------------------------------   HPI BP 118/62   Pulse 75   Temp (!) 97.5 F (36.4 C)   Wt 134 lb 9.6 oz (61.1 kg)   SpO2 99%   BMI 25.43 kg/m  70 y.o.female presents for feet pain  Pt is having pins and needles sensation in feet bilaterally, worse with walking and in the afternoon. No change in color of feet. Also gets burning sensation in both feet. Noticed after she had urethral blocking 2 weeks ago.   Had urethral blocking for overactive bladder and stress incontinence. Has noticed minimal relief of urinary urgency.   Pt continues to have back pain - follows with [place]  [facility].  Has hypothyroidism and is currently on levothyroxine 88 mcg daily Lab Results  Component Value Date   TSH 0.26 (L) 01/28/2021    Pt does take a magnesium supplement daily but has been noticing more leg cramps recently  She is on no cholesterol medication currently.  Does try to limit saturated fats.  Lab Results  Component Value Date   CHOL 190 01/28/2021   HDL 68 01/28/2021   LDLCALC 104 (H) 01/28/2021   TRIG 86 01/28/2021   CHOLHDL 2.8 01/28/2021    Past Medical History:  Diagnosis Date   Anemia    Anxiety    Arthritis    Back pain, chronic    Cancer (HCC)  2 weeks ago    skin cancer  left anterior wrist   and right side of  face      Depression    Diverticulosis 09/25/2019   GERD (gastroesophageal reflux disease)    Hyperlipidemia    Hypothyroidism    Kidney stone    2015   Thyroid disease    Umbilical hernia    Unspecified vitamin D deficiency      Allergies  Allergen Reactions   Celebrex [Celecoxib] Other (See Comments)    "panic attack"   Other Other (See Comments)    Skin bruises very easily and peels back   Topamax [Topiramate] Other (See Comments)    MEMORY   Entex Lq [Phenylephrine-Guaifenesin]  Rash   Fiorinal [Butalbital-Aspirin-Caffeine] Rash    Current Outpatient Medications on File Prior to Visit  Medication Sig   ALPRAZolam (XANAX) 1 MG tablet TAKE 1/2 TO 1 TABLET BY MOUTH EVERY NIGHT AT BEDTIME. LIMIT TO 5 DAYS PER WEEK TO AVOID ADDICTION   Cholecalciferol (VITAMIN D3 PO) Take 5,000 Units by mouth 2 (two) times a day.    Cyanocobalamin (B-12 PO) Take 1,000 mcg by mouth daily.    cyclobenzaprine (FLEXERIL) 5 MG tablet Take 5 mg by mouth at bedtime.   estradiol (ESTRACE) 1 MG tablet Take 1 mg by mouth daily.   levothyroxine (SYNTHROID) 88 MCG tablet Take 1/2 tablet one day a week and 1 tablet all other days.   lidocaine (LIDODERM) 5 % lidocaine 5 % topical patch  APP 1 PATCH AA OF SKIN FOR 12 H THEN TK THE PATCH OFF FOR  12 H   Magnesium 500 MG TABS Take 500 mg by mouth 4 (four) times daily. Taking 2 a day   ropinirole (REQUIP) 5 MG tablet Take  1 tablet  1 hour  before  Bedtime for Restless Legs (Patient taking differently: Is taking 1/2 a pill at bedtime)   ipratropium (ATROVENT) 0.06 % nasal spray Use 1 to 2 sprays each Nostril 2 to 3 x /day (Patient not taking: Reported on 06/30/2021)   levothyroxine (SYNTHROID) 88 MCG tablet Synthroid 88 mcg tablet  TAKE 1 TABLET BY MOUTH EVERY DAY (Patient not taking: Reported on 06/30/2021)   predniSONE (DELTASONE) 20 MG tablet 1 tab 3 x day for 3 days, then 1 tab 2 x day for 3 days, then 1 tab 1 x day for 5 days (Patient not taking: Reported on 06/30/2021)   No current facility-administered medications on file prior to visit.    Review of Systems  Constitutional:  Negative for chills and fever.  HENT:  Negative for congestion and sore throat.   Eyes:  Negative for blurred vision and double vision.  Respiratory:  Negative for cough, shortness of breath and wheezing.   Cardiovascular:  Negative for chest pain, palpitations and leg swelling.  Gastrointestinal:  Negative for constipation, diarrhea, heartburn, nausea and vomiting.  Genitourinary:  Positive for frequency and urgency.  Musculoskeletal:  Positive for back pain and joint pain.  Skin:  Negative for rash.  Neurological:  Positive for tingling (numbness and burning of feet). Negative for dizziness.  Endo/Heme/Allergies:  Does not bruise/bleed easily.  Psychiatric/Behavioral:  Negative for depression.     Physical Exam:  BP 118/62   Pulse 75   Temp (!) 97.5 F (36.4 C)   Wt 134 lb 9.6 oz (61.1 kg)   SpO2 99%   BMI 25.43 kg/m   General Appearance: Well nourished, in no apparent distress. Eyes: PERRLA, EOMs, conjunctiva no swelling or erythema Sinuses: No Frontal/maxillary tenderness ENT/Mouth: Ext aud canals clear, TMs without erythema, bulging. No erythema, swelling, or exudate on post pharynx.   Tonsils not swollen or erythematous. Hearing normal.  Neck: Supple, thyroid normal.  Respiratory: Respiratory effort normal, BS equal bilaterally without rales, rhonchi, wheezing or stridor.  Cardio: RRR with no MRGs. Brisk peripheral pulses without edema.  Abdomen: Soft, + BS.  Non tender, no guarding, rebound, hernias, masses. Lymphatics: Non tender without lymphadenopathy.  Musculoskeletal: Full ROM, 5/5 strength, normal gait.  Skin: Warm, dry without rashes, lesions, ecchymosis.  Neuro: Cranial nerves intact. Normal muscle tone, no cerebellar symptoms. Sensation intact. Normal sensation with monofilament and vibration Psych: Awake and oriented X 3, normal  affect, Insight and Judgment appropriate.     Magda Bernheim, NP 2:15 PM [facility]

## 2021-06-30 ENCOUNTER — Ambulatory Visit: Payer: Medicare PPO | Admitting: Nurse Practitioner

## 2021-06-30 ENCOUNTER — Encounter: Payer: Self-pay | Admitting: Nurse Practitioner

## 2021-06-30 ENCOUNTER — Other Ambulatory Visit: Payer: Self-pay

## 2021-06-30 VITALS — BP 118/62 | HR 75 | Temp 97.5°F | Wt 134.6 lb

## 2021-06-30 DIAGNOSIS — R7309 Other abnormal glucose: Secondary | ICD-10-CM

## 2021-06-30 DIAGNOSIS — Z23 Encounter for immunization: Secondary | ICD-10-CM | POA: Diagnosis not present

## 2021-06-30 DIAGNOSIS — G629 Polyneuropathy, unspecified: Secondary | ICD-10-CM

## 2021-06-30 DIAGNOSIS — E782 Mixed hyperlipidemia: Secondary | ICD-10-CM | POA: Diagnosis not present

## 2021-06-30 DIAGNOSIS — R252 Cramp and spasm: Secondary | ICD-10-CM | POA: Diagnosis not present

## 2021-06-30 DIAGNOSIS — Z981 Arthrodesis status: Secondary | ICD-10-CM | POA: Diagnosis not present

## 2021-06-30 DIAGNOSIS — E039 Hypothyroidism, unspecified: Secondary | ICD-10-CM

## 2021-06-30 MED ORDER — DULOXETINE HCL 20 MG PO CPEP: 20.0000 mg | ORAL_CAPSULE | Freq: Every day | ORAL | 2 refills | Status: DC

## 2021-06-30 NOTE — Patient Instructions (Signed)
Duloxetine Delayed-Release Capsules What is this medication? DULOXETINE (doo LOX e teen) treats depression, anxiety, fibromyalgia, and certain types of chronic pain such as nerve, bone, or joint pain. It increases the amount of serotonin and norepinephrine in the brain, hormones that help regulate mood and pain. It belongs to a group of medications called SNRIs. This medicine may be used for other purposes; ask your health care provider or pharmacist if you have questions. COMMON BRAND NAME(S): Cymbalta, Drizalma, Irenka What should I tell my care team before I take this medication? They need to know if you have any of these conditions: Bipolar disorder Glaucoma High blood pressure Kidney disease Liver disease Seizures Suicidal thoughts, plans or attempt; a previous suicide attempt by you or a family member Take medications that treat or prevent blood clots Taken medications called MAOIs like Carbex, Eldepryl, Marplan, Nardil, and Parnate within 14 days Trouble passing urine An unusual reaction to duloxetine, other medications, foods, dyes, or preservatives Pregnant or trying to get pregnant Breast-feeding How should I use this medication? Take this medication by mouth with a glass of water. Follow the directions on the prescription label. Do not crush, cut or chew some capsules of this medication. Some capsules may be opened and sprinkled on applesauce. Check with your care team or pharmacist if you are not sure. You can take this medication with or without food. Take your medication at regular intervals. Do not take your medication more often than directed. Do not stop taking this medication suddenly except upon the advice of your care team. Stopping this medication too quickly may cause serious side effects or your condition may worsen. A special MedGuide will be given to you by the pharmacist with each prescription and refill. Be sure to read this information carefully each time. Talk to  your care team regarding the use of this medication in children. While this medication may be prescribed for children as young as 7 years of age for selected conditions, precautions do apply. Overdosage: If you think you have taken too much of this medicine contact a poison control center or emergency room at once. NOTE: This medicine is only for you. Do not share this medicine with others. What if I miss a dose? If you miss a dose, take it as soon as you can. If it is almost time for your next dose, take only that dose. Do not take double or extra doses. What may interact with this medication? Do not take this medication with any of the following: Desvenlafaxine Levomilnacipran Linezolid MAOIs like Carbex, Eldepryl, Emsam, Marplan, Nardil, and Parnate Methylene blue (injected into a vein) Milnacipran Safinamide Thioridazine Venlafaxine Viloxazine This medication may also interact with the following: Alcohol Amphetamines Aspirin and aspirin-like medications Certain antibiotics like ciprofloxacin and enoxacin Certain medications for blood pressure, heart disease, irregular heart beat Certain medications for depression, anxiety, or psychotic disturbances Certain medications for migraine headache like almotriptan, eletriptan, frovatriptan, naratriptan, rizatriptan, sumatriptan, zolmitriptan Certain medications that treat or prevent blood clots like warfarin, enoxaparin, and dalteparin Cimetidine Fentanyl Lithium NSAIDS, medications for pain and inflammation, like ibuprofen or naproxen Phentermine Procarbazine Rasagiline Sibutramine St. John's wort Theophylline Tramadol Tryptophan This list may not describe all possible interactions. Give your health care provider a list of all the medicines, herbs, non-prescription drugs, or dietary supplements you use. Also tell them if you smoke, drink alcohol, or use illegal drugs. Some items may interact with your medicine. What should I watch  for while using this medication? Tell your   care team if your symptoms do not get better or if they get worse. Visit your care team for regular checks on your progress. Because it may take several weeks to see the full effects of this medication, it is important to continue your treatment as prescribed by your care team. This medication may cause serious skin reactions. They can happen weeks to months after starting the medication. Contact your care team right away if you notice fevers or flu-like symptoms with a rash. The rash may be red or purple and then turn into blisters or peeling of the skin. Or, you might notice a red rash with swelling of the face, lips, or lymph nodes in your neck or under your arms. Watch for new or worsening thoughts of suicide or depression. This includes sudden changes in mood, behaviors, or thoughts. These changes can happen at any time but are more common in the beginning of treatment or after a change in dose. Call your care team right away if you experience these thoughts or worsening depression. Manic episodes may happen in patients with bipolar disorder who take this medication. Watch for changes in feelings or behaviors such as feeling anxious, nervous, agitated, panicky, irritable, hostile, aggressive, impulsive, severely restless, overly excited and hyperactive, or trouble sleeping. These symptoms can happen at any time, but are more common in the beginning of treatment or after a change in dose. Call your care team right away if you notice any of these symptoms. You may get drowsy or dizzy. Do not drive, use machinery, or do anything that needs mental alertness until you know how this medication affects you. Do not stand or sit up quickly, especially if you are an older patient. This reduces the risk of dizzy or fainting spells. Alcohol may interfere with the effect of this medication. Avoid alcoholic drinks. This medication may increase blood sugar. The risk may be  higher in patients who already have diabetes. Ask your care team what you can do to lower your risk of diabetes while taking this medication. This medication can cause an increase in blood pressure. This medication can also cause a sudden drop in your blood pressure, which may make you feel faint and increase the chance of a fall. These effects are most common when you first start the medication or when the dose is increased, or during use of other medications that can cause a sudden drop in blood pressure. Check with your care team for instructions on monitoring your blood pressure while taking this medication. Your mouth may get dry. Chewing sugarless gum or sucking hard candy, and drinking plenty of water, may help. Contact your care team if the problem does not go away or is severe. What side effects may I notice from receiving this medication? Side effects that you should report to your care team as soon as possible: Allergic reactions-skin rash, itching, hives, swelling of the face, lips, tongue, or throat Bleeding-bloody or black, tar-like stools, red or dark brown urine, vomiting blood or brown material that looks like coffee grounds, small, red or purple spots on skin, unusual bleeding or bruising Increase in blood pressure Liver injury-right upper belly pain, loss of appetite, nausea, light-colored stool, dark yellow or brown urine, yellowing skin or eyes, unusual weakness or fatigue Low sodium level-muscle weakness, fatigue, dizziness, headache, confusion Redness, blistering, peeling, or loosening of the skin, including inside the mouth Serotonin syndrome-irritability, confusion, fast or irregular heartbeat, muscle stiffness, twitching muscles, sweating, high fever, seizures, chills, vomiting, diarrhea  Sudden eye pain or change in vision such as blurry vision, seeing halos around lights, vision loss Thoughts of suicide or self-harm, worsening mood, feelings of depression Trouble passing  urine Side effects that usually do not require medical attention (report to your care team if they continue or are bothersome): Change in sex drive or performance Constipation Diarrhea Dizziness Dry mouth Excessive sweating Loss of appetite Nausea Vomiting This list may not describe all possible side effects. Call your doctor for medical advice about side effects. You may report side effects to FDA at 1-800-FDA-1088. Where should I keep my medication? Keep out of the reach of children and pets. Store at room temperature between 15 and 30 degrees C (59 to 86 degrees F). Get rid of any unused medication after the expiration date. To get rid of medications that are no longer needed or have expired: Take the medication to a medication take-back program. Check with your pharmacy or law enforcement to find a location. If you cannot return the medication, check the label or package insert to see if the medication should be thrown out in the garbage or flushed down the toilet. If you are not sure, ask your care team. If it is safe to put it in the trash, take the medication out of the container. Mix the medication with cat litter, dirt, coffee grounds, or other unwanted substance. Seal the mixture in a bag or container. Put it in the trash. NOTE: This sheet is a summary. It may not cover all possible information. If you have questions about this medicine, talk to your doctor, pharmacist, or health care provider.  2022 [employer] (2020-09-14 12:32:25)

## 2021-07-01 ENCOUNTER — Other Ambulatory Visit: Payer: Self-pay | Admitting: Nurse Practitioner

## 2021-07-01 ENCOUNTER — Telehealth: Payer: Self-pay

## 2021-07-01 DIAGNOSIS — B001 Herpesviral vesicular dermatitis: Secondary | ICD-10-CM

## 2021-07-01 LAB — CBC WITH DIFFERENTIAL/PLATELET
Absolute Monocytes: 281 cells/uL (ref 200–950)
Basophils Absolute: 52 cells/uL (ref 0–200)
Basophils Relative: 1 %
Eosinophils Absolute: 130 cells/uL (ref 15–500)
Eosinophils Relative: 2.5 %
HCT: 38 % (ref 35.0–45.0)
Hemoglobin: 12.8 g/dL (ref 11.7–15.5)
Lymphs Abs: 1778 cells/uL (ref 850–3900)
MCH: 30.3 pg (ref 27.0–33.0)
MCHC: 33.7 g/dL (ref 32.0–36.0)
MCV: 89.8 fL (ref 80.0–100.0)
MPV: 8.6 fL (ref 7.5–12.5)
Monocytes Relative: 5.4 %
Neutro Abs: 2959 cells/uL (ref 1500–7800)
Neutrophils Relative %: 56.9 %
Platelets: 325 10*3/uL (ref 140–400)
RBC: 4.23 10*6/uL (ref 3.80–5.10)
RDW: 12.3 % (ref 11.0–15.0)
Total Lymphocyte: 34.2 %
WBC: 5.2 10*3/uL (ref 3.8–10.8)

## 2021-07-01 LAB — COMPLETE METABOLIC PANEL WITH GFR
AG Ratio: 1.7 (calc) (ref 1.0–2.5)
ALT: 20 U/L (ref 6–29)
AST: 26 U/L (ref 10–35)
Albumin: 4.3 g/dL (ref 3.6–5.1)
Alkaline phosphatase (APISO): 57 U/L (ref 37–153)
BUN: 14 mg/dL (ref 7–25)
CO2: 26 mmol/L (ref 20–32)
Calcium: 9.4 mg/dL (ref 8.6–10.4)
Chloride: 103 mmol/L (ref 98–110)
Creat: 0.74 mg/dL (ref 0.60–1.00)
Globulin: 2.6 g/dL (calc) (ref 1.9–3.7)
Glucose, Bld: 158 mg/dL — ABNORMAL HIGH (ref 65–99)
Potassium: 4 mmol/L (ref 3.5–5.3)
Sodium: 139 mmol/L (ref 135–146)
Total Bilirubin: 0.4 mg/dL (ref 0.2–1.2)
Total Protein: 6.9 g/dL (ref 6.1–8.1)
eGFR: 87 mL/min/{1.73_m2} (ref >=60)

## 2021-07-01 LAB — LIPID PANEL
Cholesterol: 196 mg/dL (ref <200)
HDL: 77 mg/dL (ref >=50)
LDL Cholesterol (Calc): 89 mg/dL (calc)
Non-HDL Cholesterol (Calc): 119 mg/dL (calc) (ref <130)
Total CHOL/HDL Ratio: 2.5 (calc) (ref <5.0)
Triglycerides: 201 mg/dL — ABNORMAL HIGH (ref <150)

## 2021-07-01 LAB — TSH: TSH: 0.51 mIU/L (ref 0.40–4.50)

## 2021-07-01 LAB — MAGNESIUM: Magnesium: 2 mg/dL (ref 1.5–2.5)

## 2021-07-01 LAB — HEMOGLOBIN A1C
Hgb A1c MFr Bld: 5.3 % of total Hgb (ref <5.7)
Mean Plasma Glucose: 105 mg/dL
eAG (mmol/L): 5.8 mmol/L

## 2021-07-01 MED ORDER — VALACYCLOVIR HCL 500 MG PO TABS: 500.0000 mg | ORAL_TABLET | Freq: Two times a day (BID) | ORAL | 0 refills | Status: AC

## 2021-07-01 NOTE — Telephone Encounter (Signed)
Patient says she has a fever blister coming on. Can you call in something for it?

## 2021-07-01 NOTE — Telephone Encounter (Signed)
Valtrex 1 twice a day for 5 days was sent to [facility]

## 2021-07-05 DIAGNOSIS — N393 Stress incontinence (female) (male): Secondary | ICD-10-CM | POA: Diagnosis not present

## 2021-07-05 DIAGNOSIS — N952 Postmenopausal atrophic vaginitis: Secondary | ICD-10-CM | POA: Diagnosis not present

## 2021-07-05 DIAGNOSIS — N3946 Mixed incontinence: Secondary | ICD-10-CM | POA: Diagnosis not present

## 2021-07-11 ENCOUNTER — Encounter: Payer: Self-pay | Admitting: Dermatology

## 2021-07-11 NOTE — Progress Notes (Signed)
   Follow-Up Visit   Subjective  Monique Thomas is a 70 y.o. female who presents for the following: Procedure (Here for treatment left forearm BCC and left upper arm CIS. ).  2 biopsy-proven nonmelanoma skin cancers Location:  Duration:  Quality:  Associated Signs/Symptoms: Modifying Factors:  Severity:  Timing: Context:   Objective  Well appearing patient in no apparent distress; mood and affect are within normal limits. Left Forearm - Posterior Lesion identified by Dr.Rudolph Daoust and nurse in room.    Left Upper Arm - Posterior Lesion identified by Dr.Emryn Flanery and nurse in room.      Areas examined including the head, neck, and arms.   Assessment & Plan    Basal cell carcinoma (BCC) of skin of right upper extremity including shoulder Left Forearm - Posterior  Destruction of lesion Complexity: simple   Destruction method: electrodesiccation and curettage   Informed consent: discussed and consent obtained   Timeout:  patient name, date of birth, surgical site, and procedure verified Anesthesia: the lesion was anesthetized in a standard fashion   Anesthetic:  1% lidocaine w/ epinephrine 1-100,000 local infiltration Curettage performed in three different directions: Yes   Curettage cycles:  3 Lesion length (cm):  1.7 Lesion width (cm):  1.7 Margin per side (cm):  0 Final wound size (cm):  1.7 Hemostasis achieved with:  ferric subsulfate Outcome: patient tolerated procedure well with no complications   Additional details:  Wound innoculated with 5 fluorouracil solution.  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 - Posterior  Destruction of lesion Complexity: simple   Destruction method: electrodesiccation and curettage   Informed consent: discussed and consent obtained   Timeout:  patient name, date of birth, surgical site, and procedure verified Anesthesia: the lesion was anesthetized in a standard fashion   Anesthetic:  1% lidocaine w/ epinephrine 1-100,000  local infiltration Curettage performed in three different directions: Yes   Electrodesiccation performed over the curetted area: Yes   Curettage cycles:  3 Lesion length (cm):  1.5 Lesion width (cm):  1.5 Margin per side (cm):  0 Final wound size (cm):  1.5 Hemostasis achieved with:  ferric subsulfate Outcome: patient tolerated procedure well with no complications   Additional details:  Wound innoculated with 5 fluorouracil solution.      I, Lavonna Monarch, MD, have reviewed all documentation for this visit.  The documentation on 07/11/21 for the exam, diagnosis, procedures, and orders are all accurate and complete.

## 2021-07-15 ENCOUNTER — Encounter: Payer: Medicare PPO | Admitting: Adult Health Nurse Practitioner

## 2021-07-18 ENCOUNTER — Other Ambulatory Visit: Payer: Self-pay | Admitting: Internal Medicine

## 2021-07-23 NOTE — Progress Notes (Signed)
AWV and follow up  Assessment:      Annual Medicare Wellness Visit Due annually  Health maintenance reviewed Clarify second shingrix date with pharmacy   Aortic atherosclerosis ([place]) CT 09/25/19 Control blood pressure, lipids and glucose Disscused lifestyle modifications, diet & exercise Continue to monitor  Labile hypertension Controlled with llifestlye Monitor blood pressure at home; call if consistently over 130/80 Continue DASH diet.   Reminder to go to the ER if any CP, SOB, nausea, dizziness, severe HA, changes vision/speech, left arm numbness and tingling and jaw pain.  Hyperlipidemia, mixed Not currently on agent; discussed risks with atherosclerosis; she is receptive to starting rosuvastatin low dose low frequency for LDL goal <70 Monitor blood pressure at home; call if consistently over 130/80 Continue DASH diet.   Reminder to go to the ER if any CP, SOB, nausea, dizziness, severe HA, changes vision/speech, left arm numbness and tingling and jaw pain.  Gastroesophageal reflux disease with esophagitis without hemorrhage Well managed on current medications  Discussed diet, avoiding triggers and other lifestyle changes  Vitamin D deficiency Continue supplementation  Check level annually and PRN dose change  S/P lumbar fusion Chronic pain syndrome She is no longer following ortho/neurosurgery Declined pain med referral  Continue topical/oral NSAIDs, poorly tolerated cymbalta Patient preference to retry gabapentin - sent in   Abnormal glucose Discussed dietary and exercise modifications - check A1C annually  Hypothyroidism continue medications the same pending lab results reminded to take on an empty stomach 30-23mins before food.  check TSH level  Anxiety Well managed by current regimen; continue medications  Stress management techniques discussed, increase water, good sleep hygiene discussed, increase exercise, and increase veggies.   Osteopenia of  left hip - get dexa with next mammogram - order faxed to [facility] to schedule  continue Vit D and high Ca diet, weight bearing exercises encouraged at least every other day   Restless legs Continue requip, continue walking, check iron, adding low dose gabapentin PRN   B12 deficiency Taking 1,040mcg daily recommended to reduce by half Defer level today   BMI 25.0-25.9 Discussed dietary and exercise modifications  Medication management Continued  Colon cancer screening - patient declines a colonoscopy even though the risks and benefits were discussed at length. Colon cancer is 3rd most diagnosed cancer and 2nd leading cause of death in both men and women 65 years of age and older. Patient understands the risk of cancer and death with declining the test however they are willing to do cologuard screening instead. They understand that this is not as sensitive or specific as a colonoscopy and they are still recommended to get a colonoscopy. The cologuard will be sent out to their house.    Orders Placed This Encounter  Procedures   DG Bone Density   CBC with Differential/Platelet   COMPLETE METABOLIC PANEL WITH GFR   Magnesium   TSH   Iron, TIBC and Ferritin Panel   Cologuard     Further disposition pending results if labs check today. Discussed med's effects and SE's.   Over 40 minutes of face to face interview, exam, counseling, chart review, and critical decision making was performed.    Future Appointments  Date Time Provider [place]  08/30/2021  1:20 PM Jaquita Folds, MD [facility]  11/01/2021  9:45 AM Lavonna Monarch, MD [facility]      Subjective: HPI  Monique Thomas is a 70 y.o. female who presents for AWV and follow up. She has Back pain,  chronic; Hyperlipidemia; Hypothyroidism; GERD (gastroesophageal reflux disease); Anxiety; Vitamin D deficiency; Medication management; Other abnormal glucose; Chronic pain syndrome; Restless legs; Menopausal symptoms;  Osteopenia; Aortic atherosclerosis ([place]) by Abd CT Scan 09/25/2019; and Diverticulosis on their problem list.  She got married in 2019, has a grown daughter, son was murdered. She does had 2 bio grandkids. She is retired from Environmental consultant at [employer].   She is followed by [facility]. She is s/p hysterectomy, on estrace 1 mg via GYN. Will also be seeing a urogyn next month due to incontinence and frequency. Hx of bladder sling in 2004, has done pelvic PT. Limited benefit with meds.   She has seen ortho/neurosurgeon for chronic back pain (arthritis, reports had MRI neg for impingement) persistent R leg and knee following replacement in 2019, has seen ortho, had injections, done PT without benefit. NSAIDs do help but trying to limit use, uses topicals. She as trying cymbalta 20 mg but reports poorly tolerated, would like to retry low dose gabapentin. Tylenol not helpful. She has tried TENS unit, interested in [place], can get with [facility], requests letter.   she has a diagnosis of anxiety and is currently on xanax 1 mg, reports symptoms are well controlled on current regimen. she currently takes 1/2 tab at night to help with sleep/restless legs, has been tapering down and reducing use.   BMI is Body mass index is 25.57 kg/m., she has been working on diet and exercise, walks to get 10000 steps by tracker daily.  Wt Readings from Last 3 Encounters:  07/27/21 132 lb (59.9 kg)  06/30/21 134 lb 9.6 oz (61.1 kg)  05/20/21 134 lb (60.8 kg)   She does not have htn. Today their BP is BP: 126/76 She does workout. She denies chest pain, shortness of breath, dizziness.   She has aortic atherosclerosis per CT 09/2019.   She is not on cholesterol medication and denies myalgias. Her cholesterol is at goal. The cholesterol last visit was:   Lab Results  Component Value Date   CHOL 196 06/30/2021   HDL 77 06/30/2021   LDLCALC 89 06/30/2021   TRIG 201 (H) 06/30/2021   CHOLHDL 2.5 06/30/2021     She has been working on diet and exercise for glucose management, and denies foot ulcerations, increased appetite, nausea, paresthesia of the feet, polydipsia, polyuria, visual disturbances, vomiting and weight loss. Last A1C in the office was:  Lab Results  Component Value Date   HGBA1C 5.3 06/30/2021   She is on thyroid medication. Her medication was changed last visit, reduced from 88 mcg daily to 1/2 tab twice a week and whole tab other days.  Lab Results  Component Value Date   TSH 0.51 06/30/2021   Last GFR: Lab Results  Component Value Date   GFRNONAA 91 01/28/2021   Patient is on Vitamin D supplement  Lab Results  Component Value Date   VD25OH 78 01/28/2021         Medication Review: Current Outpatient Medications on File Prior to Visit  Medication Sig Dispense Refill   ALPRAZolam (XANAX) 1 MG tablet TAKE 1/2 TO 1 TABLET BY MOUTH EVERY NIGHT AT BEDTIME. LIMIT TO 5 DAYS PER WEEK TO AVOID ADDICTION 30 tablet 0   Cholecalciferol (VITAMIN D3 PO) Take 5,000 Units by mouth 2 (two) times a day.      Cyanocobalamin (B-12 PO) Take 1,000 mcg by mouth daily.      cyclobenzaprine (FLEXERIL) 5 MG tablet Take 5 mg by  mouth at bedtime.     estradiol (ESTRACE) 1 MG tablet Take 1 mg by mouth daily.     levothyroxine (SYNTHROID) 88 MCG tablet Take 1/2 tablet one day a week and 1 tablet all other days. 30 tablet 11   lidocaine (LIDODERM) 5 % lidocaine 5 % topical patch  APP 1 PATCH AA OF SKIN FOR 12 H THEN TK THE PATCH OFF FOR 12 H     Magnesium 500 MG TABS Take 500 mg by mouth 4 (four) times daily. Taking 2 a day     ropinirole (REQUIP) 5 MG tablet Take  1 tablet   at Bedtime  for Restless Legs                              /              TAKE 1 TABLET BY MOUTH 90 tablet 3   No current facility-administered medications on file prior to visit.    Allergies  Allergen Reactions   Celebrex [Celecoxib] Other (See Comments)    "panic attack"   Other Other (See Comments)    Skin bruises  very easily and peels back   Topamax [Topiramate] Other (See Comments)    MEMORY   Entex Lq [Phenylephrine-Guaifenesin] Rash   Fiorinal [Butalbital-Aspirin-Caffeine] Rash    Current Problems (verified) Patient Active Problem List   Diagnosis Date Noted   Aortic atherosclerosis ([place]) by Abd CT Scan 09/25/2019 09/25/2019   Diverticulosis 09/25/2019   Osteopenia 02/26/2019   Menopausal symptoms 05/07/2018   Restless legs 02/06/2018   Chronic pain syndrome 03/27/2015   Medication management 01/15/2014   Other abnormal glucose 01/15/2014   Back pain, chronic    Hyperlipidemia    Hypothyroidism    GERD (gastroesophageal reflux disease)    Anxiety    Vitamin D deficiency     Screening Tests Immunization History  Administered Date(s) Administered   Influenza Split 06/29/2015   Influenza Whole 05/21/2013   Influenza, High Dose Seasonal PF 05/30/2016, 06/13/2017, 06/18/2018, 06/24/2019, 07/02/2020, 06/30/2021   Influenza,inj,quad, With Preservative 06/19/2017   Influenza-Unspecified 06/09/2014, 06/20/2015   Janssen (J&J) SARS-COV-2 Vaccination 02/19/2020   PPD Test 09/23/2013, 10/01/2014, 02/26/2016   Pneumococcal Conjugate-13 05/03/2017   Pneumococcal Polysaccharide-23 09/23/2013   Pneumococcal-Unspecified 06/19/2017   Td 09/19/2005   Tdap 10/07/2015   Zoster Recombinat (Shingrix) 12/03/2020   Zoster, Live 03/27/2014   Preventative care: Last colonoscopy: 2008  Cologuard: 04/2017 neg due 04/2021 - ordered  Last mammogram: 08/2020 - will schedule in December  Last pap smear/pelvic exam: 09/2017 GYN normal, [place], follows for pelvic annually DEXA: 01/2017 L fem -1.2 postpone to next year, ordered to schedule with next mammogram, discussed with patient.  Prior vaccinations: TD or Tdap: 2017  Influenza: 06/2021 Pneumococcal: 2015 Prevnar13: 2018 Shingles/Zostavax: 2015, had shingrix x 2 per patient  Covid 19: declines, discussed with patient  Names of Other  Physician/Practitioners you currently use: 1. [facility] here for primary care 2. Dr. Einar Gip, eye doctor, last visit 2021, s/p cataracts has scheduled next month 3. Dr. Toy Cookey, dentist, Q6 months, last visit 2022, Q23m  Patient Care Team: [name], MD as PCP - General (Internal Medicine) Darlis Loan, MD as Referring Physician (Gastroenterology) Vania Rea, MD as Consulting Physician (Obstetrics and Gynecology) Carolan Clines, MD (Inactive) as Consulting Physician (Urology) Zara Council as Physician Assistant (Orthopedic Surgery) Lavonna Monarch, MD as Consulting Physician (Dermatology) Carlean Purl,  Ofilia Neas, MD as Consulting Physician (Gastroenterology) Webb Laws, [place] as Referring Physician (Optometry)  SURGICAL HISTORY She  has a past surgical history that includes Abdominal hysterectomy; Cesarean section; Hernia repair; Appendectomy; Tonsillectomy and adenoidectomy; Spine surgery; Laparoscopic lysis intestinal adhesions; Maximum access (mas)posterior lumbar interbody fusion (plif) 1 level (N/A, 04/16/2015); Anterior lat lumbar fusion (N/A, 04/07/2017); and Total knee arthroplasty (Left, 07/17/2018). FAMILY HISTORY Her family history includes Asthma in her sister; Cancer in her maternal grandmother; Heart disease in her mother; Hyperlipidemia in her mother; Thyroid disease in her mother. SOCIAL HISTORY She  reports that she has never smoked. She has never used smokeless tobacco. She reports that she does not drink alcohol and does not use drugs.   MEDICARE WELLNESS OBJECTIVES: Physical activity: Current Exercise Habits: Home exercise routine, Type of exercise: walking, Time (Minutes): 30, Frequency (Times/Week): 7, Weekly Exercise (Minutes/Week): 210, Intensity: Mild, Exercise limited by: orthopedic condition(s) Cardiac risk factors: Cardiac Risk Factors include: advanced age (>75men, >80  women);dyslipidemia;hypertension Depression/mood screen:   Depression screen [facility] 2/9 07/27/2021  Decreased Interest 0  Down, Depressed, Hopeless 0  PHQ - 2 Score 0    ADLs:  In your present state of health, do you have any difficulty performing the following activities: 07/27/2021 01/28/2021  Hearing? N N  Vision? N N  Difficulty concentrating or making decisions? N N  Walking or climbing stairs? N N  Dressing or bathing? N N  Doing errands, shopping? N N  Preparing Food and eating ? N -  Using the Toilet? N -  In the past six months, have you accidently leaked urine? Y -  Do you have problems with loss of bowel control? N -  Managing your Medications? N -  Managing your Finances? N -  Housekeeping or managing your Housekeeping? N -  Some recent data might be hidden     Cognitive Testing  Alert? Yes  Normal Appearance?Yes  Oriented to person? Yes  Place? Yes   Time? Yes  Recall of three objects?  Yes  Can perform simple calculations? Yes  Displays appropriate judgment?Yes  Can read the correct time from a watch face?Yes  EOL planning: Does Patient Have a Medical Advance Directive?: No Would patient like information on creating a medical advance directive?: No - Patient declined     Review of Systems  Constitutional:  Negative for malaise/fatigue and weight loss.  HENT:  Negative for hearing loss and tinnitus.   Eyes:  Negative for blurred vision and double vision.  Respiratory:  Negative for cough, sputum production, shortness of breath and wheezing.   Cardiovascular:  Negative for chest pain, palpitations, orthopnea, claudication, leg swelling and PND.  Gastrointestinal:  Negative for abdominal pain, blood in stool, constipation, diarrhea, heartburn, melena, nausea and vomiting.  Genitourinary:  Positive for frequency. Negative for dysuria, hematuria and urgency.       Stress incontinences, seeing uro/gyn  Musculoskeletal:  Positive for back pain (chronic lumbar) and  joint pain (L knee following replacement, has seen ortho). Negative for falls and myalgias.  Skin:  Negative for rash.  Neurological:  Negative for dizziness, tingling, sensory change, weakness and headaches.  Endo/Heme/Allergies:  Negative for polydipsia.  Psychiatric/Behavioral:  Negative for depression, memory loss, substance abuse and suicidal ideas. The patient has insomnia. The patient is not nervous/anxious.   All other systems reviewed and are negative.   Objective:     Today's Vitals   07/27/21 0901  BP: 126/76  Pulse: 69  Temp: 97.7 F (36.5  C)  SpO2: 99%  Weight: 132 lb (59.9 kg)  Height: 5' 0.25" (1.53 m)   Body mass index is 25.57 kg/m.  General appearance: alert, no distress, WD/WN, female HEENT: normocephalic, sclerae anicteric, TMs pearly, nares patent, no discharge or erythema, pharynx normal Oral cavity: MMM, no lesions Neck: supple, no lymphadenopathy, no thyromegaly, no masses Heart: RRR, normal S1, S2, no murmurs Lungs: CTA bilaterally, no wheezes, rhonchi, or rales Abdomen: +bs, soft, non tender, non distended, no masses, no hepatomegaly, no splenomegaly Musculoskeletal: nontender, no swelling, no obvious deformity Extremities: no edema, no cyanosis, no clubbing Pulses: 2+ symmetric, upper and lower extremities, normal cap refill Neurological: alert, oriented x 3, CN2-12 intact, strength normal upper extremities and lower extremities, sensation normal throughout, DTRs 2+ throughout, no cerebellar signs, gait normal Psychiatric: normal affect, behavior normal, pleasant    Medicare Attestation I have personally reviewed: The patient's medical and social history Their use of alcohol, tobacco or illicit drugs Their current medications and supplements The patient's functional ability including ADLs,fall risks, home safety risks, cognitive, and hearing and visual impairment Diet and physical activities Evidence for depression or mood disorders  The  patient's weight, height, BMI, and visual acuity have been recorded in the chart.  I have made referrals, counseling, and provided education to the patient based on review of the above and I have provided the patient with a written personalized care plan for preventive services.     Izora Ribas, NP 9:27 AM [facility]

## 2021-07-26 ENCOUNTER — Encounter: Payer: Medicare PPO | Admitting: Adult Health

## 2021-07-27 ENCOUNTER — Ambulatory Visit: Payer: Medicare PPO | Admitting: Adult Health

## 2021-07-27 ENCOUNTER — Encounter: Payer: Self-pay | Admitting: Adult Health

## 2021-07-27 ENCOUNTER — Other Ambulatory Visit: Payer: Self-pay

## 2021-07-27 VITALS — BP 126/76 | HR 69 | Temp 97.7°F | Ht 60.25 in | Wt 132.0 lb

## 2021-07-27 DIAGNOSIS — E559 Vitamin D deficiency, unspecified: Secondary | ICD-10-CM | POA: Diagnosis not present

## 2021-07-27 DIAGNOSIS — E039 Hypothyroidism, unspecified: Secondary | ICD-10-CM

## 2021-07-27 DIAGNOSIS — Z0000 Encounter for general adult medical examination without abnormal findings: Secondary | ICD-10-CM

## 2021-07-27 DIAGNOSIS — F419 Anxiety disorder, unspecified: Secondary | ICD-10-CM

## 2021-07-27 DIAGNOSIS — Z1211 Encounter for screening for malignant neoplasm of colon: Secondary | ICD-10-CM

## 2021-07-27 DIAGNOSIS — Z0001 Encounter for general adult medical examination with abnormal findings: Secondary | ICD-10-CM

## 2021-07-27 DIAGNOSIS — D649 Anemia, unspecified: Secondary | ICD-10-CM | POA: Diagnosis not present

## 2021-07-27 DIAGNOSIS — R6889 Other general symptoms and signs: Secondary | ICD-10-CM

## 2021-07-27 DIAGNOSIS — R7309 Other abnormal glucose: Secondary | ICD-10-CM | POA: Diagnosis not present

## 2021-07-27 DIAGNOSIS — Z79899 Other long term (current) drug therapy: Secondary | ICD-10-CM

## 2021-07-27 DIAGNOSIS — G2581 Restless legs syndrome: Secondary | ICD-10-CM

## 2021-07-27 DIAGNOSIS — G894 Chronic pain syndrome: Secondary | ICD-10-CM | POA: Diagnosis not present

## 2021-07-27 DIAGNOSIS — E782 Mixed hyperlipidemia: Secondary | ICD-10-CM

## 2021-07-27 DIAGNOSIS — K2100 Gastro-esophageal reflux disease with esophagitis, without bleeding: Secondary | ICD-10-CM

## 2021-07-27 DIAGNOSIS — M85852 Other specified disorders of bone density and structure, left thigh: Secondary | ICD-10-CM | POA: Diagnosis not present

## 2021-07-27 DIAGNOSIS — K5790 Diverticulosis of intestine, part unspecified, without perforation or abscess without bleeding: Secondary | ICD-10-CM | POA: Diagnosis not present

## 2021-07-27 DIAGNOSIS — I700 Atherosclerosis of aorta: Secondary | ICD-10-CM

## 2021-07-27 MED ORDER — GABAPENTIN 100 MG PO CAPS: ORAL_CAPSULE | ORAL | 3 refills | Status: DC

## 2021-07-27 MED ORDER — ROSUVASTATIN CALCIUM 5 MG PO TABS: ORAL_TABLET | ORAL | 3 refills | Status: DC

## 2021-07-27 NOTE — Patient Instructions (Addendum)
Please clarify second shingles date - have documented 11/2020 only   Please schedule bone density with upcoming mammogram - order faxed  Try gabapentin 1-2 caps up to three times a day      COLOGUARD INFORMATION  Colon cancer is 3rd most diagnosed cancer and 2nd leading cause of death in both men and women 70 years of age and older despite being one of the most preventable and treatable cancers if found early.  4 of out 5 people diagnosed with colon cancer have NO prior family history.  When caught EARLY 90% of colon cancer is curable.   More than 92% of cologuard patients have NO out of pocket cost for screening however only your insurer can confirm how Cologuard would be covered for you. [facility]olog[facility] has a team of specialist that can help you contact your insurer and ask the right questions. Please call [phone] so they can help.   You will receive a short call from [place] support center at [employer], when you receive a call they will say they are from [place],  to confirm your mailing address and give you more information.  When they calll you, it will appear on the caller ID as "[employer]" or in some cases only this number will appear, [phone].   [employer] will ship your collection kit directly to you. You will collect a single stool sample in the privacy of your own home, no special preparation required. You will return the kit via [place] pre-paid shipping or pick-up, in the same box it arrived in. Then I will contact you to discuss your results after I receive them from the laboratory.   If you have any questions or concerns, [facility] Customer Support Specialist are available 24 hours a day, 7 days a week at [phone] or go to [email or web address].

## 2021-07-28 ENCOUNTER — Other Ambulatory Visit: Payer: Self-pay | Admitting: Adult Health

## 2021-07-28 DIAGNOSIS — E039 Hypothyroidism, unspecified: Secondary | ICD-10-CM

## 2021-07-28 LAB — CBC WITH DIFFERENTIAL/PLATELET
Absolute Monocytes: 353 cells/uL (ref 200–950)
Basophils Absolute: 42 cells/uL (ref 0–200)
Basophils Relative: 0.9 %
Eosinophils Absolute: 310 cells/uL (ref 15–500)
Eosinophils Relative: 6.6 %
HCT: 36.8 % (ref 35.0–45.0)
Hemoglobin: 12.5 g/dL (ref 11.7–15.5)
Lymphs Abs: 1504 cells/uL (ref 850–3900)
MCH: 30.4 pg (ref 27.0–33.0)
MCHC: 34 g/dL (ref 32.0–36.0)
MCV: 89.5 fL (ref 80.0–100.0)
MPV: 8.6 fL (ref 7.5–12.5)
Monocytes Relative: 7.5 %
Neutro Abs: 2491 cells/uL (ref 1500–7800)
Neutrophils Relative %: 53 %
Platelets: 314 10*3/uL (ref 140–400)
RBC: 4.11 10*6/uL (ref 3.80–5.10)
RDW: 12.6 % (ref 11.0–15.0)
Total Lymphocyte: 32 %
WBC: 4.7 10*3/uL (ref 3.8–10.8)

## 2021-07-28 LAB — COMPLETE METABOLIC PANEL WITH GFR
AG Ratio: 1.8 (calc) (ref 1.0–2.5)
ALT: 11 U/L (ref 6–29)
AST: 19 U/L (ref 10–35)
Albumin: 4.4 g/dL (ref 3.6–5.1)
Alkaline phosphatase (APISO): 52 U/L (ref 37–153)
BUN/Creatinine Ratio: 27 (calc) — ABNORMAL HIGH (ref 6–22)
BUN: 15 mg/dL (ref 7–25)
CO2: 25 mmol/L (ref 20–32)
Calcium: 9.3 mg/dL (ref 8.6–10.4)
Chloride: 105 mmol/L (ref 98–110)
Creat: 0.56 mg/dL — ABNORMAL LOW (ref 0.60–1.00)
Globulin: 2.5 g/dL (calc) (ref 1.9–3.7)
Glucose, Bld: 87 mg/dL (ref 65–99)
Potassium: 4.3 mmol/L (ref 3.5–5.3)
Sodium: 139 mmol/L (ref 135–146)
Total Bilirubin: 0.5 mg/dL (ref 0.2–1.2)
Total Protein: 6.9 g/dL (ref 6.1–8.1)
eGFR: 98 mL/min/{1.73_m2} (ref >=60)

## 2021-07-28 LAB — TSH: TSH: 0.16 mIU/L — ABNORMAL LOW (ref 0.40–4.50)

## 2021-07-28 LAB — IRON,TIBC AND FERRITIN PANEL
%SAT: 21 % (calc) (ref 16–45)
Ferritin: 28 ng/mL (ref 16–288)
Iron: 65 ug/dL (ref 45–160)
TIBC: 312 mcg/dL (calc) (ref 250–450)

## 2021-07-28 LAB — MAGNESIUM: Magnesium: 2 mg/dL (ref 1.5–2.5)

## 2021-07-28 MED ORDER — LEVOTHYROXINE SODIUM 88 MCG PO TABS: ORAL_TABLET | ORAL | 11 refills | Status: AC

## 2021-07-28 MED ORDER — LEVOTHYROXINE SODIUM 88 MCG PO TABS: ORAL_TABLET | ORAL | 11 refills | Status: DC

## 2021-08-02 DIAGNOSIS — Z1211 Encounter for screening for malignant neoplasm of colon: Secondary | ICD-10-CM | POA: Diagnosis not present

## 2021-08-07 LAB — COLOGUARD: COLOGUARD: NEGATIVE

## 2021-08-11 NOTE — Progress Notes (Signed)
Pt is aware of results and did not have any questions.

## 2021-08-16 NOTE — Progress Notes (Addendum)
Future Appointments  Date Time Provider Department  08/17/2021  9:30 AM [name], MD [facility]  08/30/2021  1:20 PM Jaquita Folds, MD [facility]  11/01/2021  9:45 AM Lavonna Monarch, MD   [facility]  11/25/2021 10:00 AM Liane Comber, NP [facility]  02/01/2022 10:30 AM Magda Bernheim, NP [facility]    History of Present Illness:     Patient is a very nice MWF with HTN, HLD, Pre-Diabetes, GERD, RLS and Vitamin D Deficiency who presents with c/o Head /chest congestion, rhinorrhea, productive cough . No fever, chills, sweats, rash or dyspnea.  Covid  and Flu tests  are Negative.  Medications  Current Outpatient Medications (Endocrine & Metabolic):    estradiol (ESTRACE) 1 MG tablet, Take 1 mg by mouth daily.   levothyroxine (SYNTHROID) 88 MCG tablet, Take 1 tablet daily except 1/2 tab on Tues/Sat for thyroid on empty stomach with water only.    rosuvastatin (CRESTOR) 5 MG tablet, Start by taking 1 tab once weekly in the evening, then in 1 month increase to twice weekly (Mon/thurs) for cholesterol and plaque prevention.    B-12, Take 1,000 mcg by mouth daily.     ALPRAZolam (XANAX) 1 MG tablet, TAKE 1/2 TO 1 TABLET BY MOUTH EVERY NIGHT AT BEDTIME. LIMIT TO 5 DAYS PER WEEK TO AVOID ADDICTION   Cholecalciferol (VITAMIN D3 PO), Take 5,000 Units by mouth 2 (two) times a day.    cyclobenzaprine (FLEXERIL) 5 MG tablet, Take 5 mg by mouth at bedtime.   gabapentin (NEURONTIN) 100 MG capsule, Take 1-2 caps up to three times a day as needed for numbness/leg pain.   lidocaine (LIDODERM) 5 %, lidocaine 5 % topical patch  APP 1 PATCH AA OF SKIN FOR 12 H THEN TK THE PATCH OFF FOR 12 H   Magnesium 500 MG TABS, Take 500 mg by mouth 4 (four) times daily. Taking 2 a day   ropinirole (REQUIP) 5 MG tablet, Take  1 tablet   at Bedtime  for Restless Legs                              /              TAKE 1 TABLET BY MOUTH  Problem list She has Back pain, chronic; Hyperlipidemia; Hypothyroidism; GERD  (gastroesophageal reflux disease); Anxiety; Vitamin D deficiency; Medication management; Other abnormal glucose; Chronic pain syndrome; Restless legs; Menopausal symptoms; Osteopenia; Aortic atherosclerosis ([place]) by Abd CT Scan 09/25/2019; and Diverticulosis on their problem list.   Observations/Objective:  BP 130/78   Pulse 72   Temp 97.9 F (36.6 C)   Resp 17   Ht 5' 0.25" (1.53 m)   Wt 132 lb 3.2 oz (60 kg)   SpO2 95%   BMI 25.60 kg/m   Dry cough. No stridor. No rash /cyanosis.  HEENT - EACs & TMs - Nl   N/O/P - clear . (+) maxillary tenderness Neck - supple.  Chest - Clear equal BS. Cor - Nl HS. RRR w/o sig M MS- FROM w/o deformities.  Gait Nl. Neuro -  Nl w/o focal abnormalities.   Assessment and Plan:   1. Subacute maxillary sinusitis  - dexamethasone 4 MG tablet;  Take 1 tab 3 x day - 3 days, then 2 x day - 3 days, then 1 tab daily   Dispense: 20 tablet;  - azithromycin 250 MG tablet; Take 2 tablets with Food on  Day 1, then 1 tablet Daily   Dispense: 6 each; Refill: 1  - pseudoephedrine 120 MG 12 hr tablet; Take  1 tablet  2 x /day (every 12 hours)  for Sinus & Chest Congestion  Dispense: 30 tablet; Refill: 1   2. Tracheitis  - dexamethasone ( 4 MG tablet;  Take 1 tab 3 x day - 3 days, then 2 x day - 3 days, then 1 tab daily   Dispense: 20 tablet  - azithromycin 250 MG tablet;  Take 2 tablets with Food on  Day 1, then 1 tablet Daily    Dispense: 6 each; Refill: 1  - promethazine-codeine 6.25-10 MG/5ML syrup; T Take 1 or 2 teaspoonful    4 x /day    as needed for Cough / Congestion   Dispense: 360 mL; Refill: 1  3. Subacute cough  - POC COVID-19 BinaxNow  - Negative   - POC Influenza A&B(BINAX/QUICKVUE) - Negative   - promethazine-codeine  6.25-10 MG/5ML syrup;  Take 1 or 2 teaspoonful    4 x /day    as needed for Cough / Congestion   Dispense: 360 mL; Refill: 1   Follow Up Instructions:        I discussed the assessment and treatment plan  with the patient. The patient was provided an opportunity to ask questions and all were answered. The patient agreed with the plan and demonstrated an understanding of the instructions.       The patient was advised to call back or seek an in-person evaluation if the symptoms worsen or if the condition fails to improve as anticipated.    Kirtland Bouchard, MD

## 2021-08-17 ENCOUNTER — Ambulatory Visit (INDEPENDENT_AMBULATORY_CARE_PROVIDER_SITE_OTHER): Payer: Medicare PPO | Admitting: Internal Medicine

## 2021-08-17 ENCOUNTER — Other Ambulatory Visit: Payer: Self-pay

## 2021-08-17 ENCOUNTER — Encounter: Payer: Self-pay | Admitting: Internal Medicine

## 2021-08-17 VITALS — BP 130/78 | HR 72 | Temp 97.9°F | Resp 17 | Ht 60.25 in | Wt 132.2 lb

## 2021-08-17 DIAGNOSIS — J0100 Acute maxillary sinusitis, unspecified: Secondary | ICD-10-CM

## 2021-08-17 DIAGNOSIS — R052 Subacute cough: Secondary | ICD-10-CM | POA: Diagnosis not present

## 2021-08-17 DIAGNOSIS — J0410 Acute tracheitis without obstruction: Secondary | ICD-10-CM | POA: Diagnosis not present

## 2021-08-17 LAB — POC INFLUENZA A&B (BINAX/QUICKVUE)
Influenza A, POC: NEGATIVE
Influenza B, POC: NEGATIVE

## 2021-08-17 LAB — POC COVID19 BINAXNOW: SARS Coronavirus 2 Ag: NEGATIVE

## 2021-08-17 MED ORDER — PROMETHAZINE-CODEINE 6.25-10 MG/5ML PO SYRP: ORAL_SOLUTION | ORAL | 1 refills | Status: DC

## 2021-08-17 MED ORDER — AZITHROMYCIN 250 MG PO TABS: ORAL_TABLET | ORAL | 1 refills | Status: DC

## 2021-08-17 MED ORDER — DEXAMETHASONE 4 MG PO TABS: ORAL_TABLET | ORAL | 0 refills | Status: DC

## 2021-08-17 MED ORDER — PSEUDOEPHEDRINE HCL ER 120 MG PO TB12: ORAL_TABLET | ORAL | 1 refills | Status: DC

## 2021-08-17 NOTE — Addendum Note (Signed)
Addended by: [name] on: 08/17/2021 01:07 PM   Modules accepted: Orders

## 2021-08-22 ENCOUNTER — Encounter: Payer: Self-pay | Admitting: Internal Medicine

## 2021-08-30 ENCOUNTER — Other Ambulatory Visit (HOSPITAL_COMMUNITY)
Admission: RE | Admit: 2021-08-30 | Discharge: 2021-08-30 | Disposition: A | Discharge status: Home / Self Care | Payer: Medicare PPO | Source: Ambulatory Visit | Attending: Obstetrics and Gynecology | Admitting: Obstetrics and Gynecology

## 2021-08-30 ENCOUNTER — Ambulatory Visit: Payer: Medicare PPO | Admitting: Obstetrics and Gynecology

## 2021-08-30 ENCOUNTER — Other Ambulatory Visit: Payer: Self-pay

## 2021-08-30 ENCOUNTER — Encounter: Payer: Self-pay | Admitting: Obstetrics and Gynecology

## 2021-08-30 VITALS — BP 175/80 | HR 91 | Ht 60.0 in | Wt 132.0 lb

## 2021-08-30 DIAGNOSIS — R350 Frequency of micturition: Secondary | ICD-10-CM

## 2021-08-30 DIAGNOSIS — N393 Stress incontinence (female) (male): Secondary | ICD-10-CM

## 2021-08-30 DIAGNOSIS — R300 Dysuria: Secondary | ICD-10-CM | POA: Diagnosis not present

## 2021-08-30 DIAGNOSIS — N3281 Overactive bladder: Secondary | ICD-10-CM | POA: Diagnosis not present

## 2021-08-30 LAB — POCT URINALYSIS DIPSTICK
Appearance: NORMAL
Bilirubin, UA: NEGATIVE
Blood, UA: NEGATIVE
Glucose, UA: NEGATIVE
Ketones, UA: NEGATIVE
Leukocytes, UA: NEGATIVE
Nitrite, UA: POSITIVE
Protein, UA: NEGATIVE
Spec Grav, UA: 1.015 (ref 1.010–1.025)
Urobilinogen, UA: 0.2 E.U./dL
pH, UA: 8.5 — AB (ref 5.0–8.0)

## 2021-08-30 NOTE — Progress Notes (Signed)
[facility] New Patient Evaluation and Consultation  Referring Provider: Robley Fries, MD PCP: [name], MD Date of Service: 08/30/2021  SUBJECTIVE Chief Complaint: New Patient (Initial Visit) Monique Thomas is a 70 y.o. female here for a consult on incontinence.)  History of Present Illness: Monique Thomas is a 70 y.o. White or Caucasian female seen in consultation at the request of Dr. Claudia Desanctis for evaluation of mixed incontinence.    Review of records from Dr Claudia Desanctis significant for: - Had TOT sling placed in 2004 and hysterectomy in 2008 - Had previously been on Myrbteriq and oxybutynin for OAB. Now on trospium.  - Urodynamic testing demonstrated both stress incontinence and detrusor instability - Had normal cystoscopy in 2022 - Underwent bulkamid urethral bulking in 05/2021- suboptimal placement due to pain.   Urinary Symptoms: Leaks urine with cough/ sneeze, laughing, and with a full bladder. SUI > UUI At least once per day, sometimes more Pad use:  liners/ mini-pads per day.   She is bothered by her UI symptoms. Not currently taking medication.   Day time voids- every few hours.  Nocturia: 0 times per night to void. Voiding dysfunction: she does not empty her bladder well.  does not use a catheter to empty bladder.  When urinating, she feels she has no difficulties Drinks: decaf tea, water, occasional coke  UTIs:  0  UTI's in the last year.   Denies history of blood in urine and kidney or bladder stones  Pelvic Organ Prolapse Symptoms:                  She Denies a feeling of a bulge the vaginal area.   Bowel Symptom: Bowel movements: 1 time(s) per day Stool consistency: hard or soft  Straining: no.  Splinting: no.  Incomplete evacuation: no.  She Denies accidental bowel leakage / fecal incontinence Bowel regimen: none   Sexual Function Sexually active: yes.  Pain with sex: No  Pelvic Pain Denies pelvic pain  Past Medical History:  Past  Medical History:  Diagnosis Date   Anemia    Anxiety    Arthritis    Back pain, chronic    Cancer (HCC)  2 weeks ago    skin cancer  left anterior wrist   and right side of  face      Depression    Diverticulosis 09/25/2019   GERD (gastroesophageal reflux disease)    Hyperlipidemia    Hypothyroidism    Kidney stone    2015   Thyroid disease    Umbilical hernia    Unspecified vitamin D deficiency      Past Surgical History:   Past Surgical History:  Procedure Laterality Date   ABDOMINAL HYSTERECTOMY     ANTERIOR LAT LUMBAR FUSION N/A 04/07/2017   Procedure: LUMBAR ONE-TWO ANTERIOR LATERAL LUMBAR FUSION WITH LATERAL PLATE;  Surgeon: Eustace Moore, MD;  Location: [facility];  Service: Neurosurgery;  Laterality: N/A;  Right side approach   APPENDECTOMY     CESAREAN SECTION     HERNIA REPAIR     LAPAROSCOPIC LYSIS INTESTINAL ADHESIONS     MAXIMUM ACCESS (MAS)POSTERIOR LUMBAR INTERBODY FUSION (PLIF) 1 LEVEL N/A 04/16/2015   Procedure: masPLIF  - L2-L3 ;  Surgeon: Eustace Moore, MD;  Location: [facility];  Service: Neurosurgery;  Laterality: N/A;  masPLIF  - L2-L3    SPINE SURGERY     lumbar   TONSILLECTOMY AND ADENOIDECTOMY     TOTAL KNEE  ARTHROPLASTY Left 07/17/2018   Procedure: LEFT TOTAL KNEE ARTHROPLASTY;  Surgeon: Paralee Cancel, MD;  Location: [facility];  Service: Orthopedics;  Laterality: Left;  70 mins     Past OB/GYN History: OB History  Gravida Para Term Preterm AB Living  2         0  SAB IAB Ectopic Multiple Live Births          2    # Outcome Date GA Lbr Len/2nd Weight Sex Delivery Anes PTL Lv  2 Gravida           1 Gravida             Vaginal deliveries: 0  Forceps/ Vacuum deliveries: 0, Cesarean section: 2 S/p hysterectomy and BSO for AUB   Medications: She has a current medication list which includes the following prescription(s): alprazolam, estradiol, levothyroxine, and ropinirole.   Allergies: Patient is allergic to celebrex [celecoxib], other, topamax  [topiramate], zofran [ondansetron], entex lq [phenylephrine-guaifenesin], and fiorinal [butalbital-aspirin-caffeine].   Social History:  Social History   Tobacco Use   Smoking status: Never   Smokeless tobacco: Never  Vaping Use   Vaping Use: Never used  Substance Use Topics   Alcohol use: No   Drug use: No    Relationship status: married   She is not employed. Regular exercise: Yes:   History of abuse: No  Family History:   Family History  Problem Relation Age of Onset   Heart disease Mother    Hyperlipidemia Mother    Thyroid disease Mother    Asthma Sister    Cancer Maternal Grandmother        ovarian     Review of Systems: Review of Systems  Constitutional:  Negative for fever, malaise/fatigue and weight loss.  Respiratory:  Negative for cough, shortness of breath and wheezing.   Cardiovascular:  Negative for chest pain, palpitations and leg swelling.  Gastrointestinal:  Negative for abdominal pain and blood in stool.  Genitourinary:  Negative for dysuria.  Musculoskeletal:  Negative for myalgias.  Skin:  Negative for rash.  Neurological:  Negative for dizziness and headaches.  Endo/Heme/Allergies:  Does not bruise/bleed easily.  Psychiatric/Behavioral:  Negative for depression. The patient is not nervous/anxious.     OBJECTIVE Physical Exam: Vitals:   08/30/21 1319  BP: (!) 175/80  Pulse: 91  Weight: 132 lb (59.9 kg)  Height: 5' (1.524 m)    Physical Exam Constitutional:      General: She is not in acute distress. Pulmonary:     Effort: Pulmonary effort is normal.  Abdominal:     General: There is no distension.     Palpations: Abdomen is soft.     Tenderness: There is no abdominal tenderness. There is no rebound.  Musculoskeletal:        General: No swelling. Normal range of motion.  Skin:    General: Skin is warm and dry.     Findings: No rash.  Neurological:     Mental Status: She is alert and oriented to person, place, and time.   Psychiatric:        Mood and Affect: Mood normal.        Behavior: Behavior normal.     GU / Detailed Urogynecologic Evaluation:  Pelvic Exam: Normal external female genitalia; Bartholin's and Skene's glands normal in appearance; urethral meatus normal in appearance, no urethral masses or discharge.   CST: negative  s/p hysterectomy: Speculum exam reveals normal vaginal mucosa with  atrophy and  normal vaginal cuff.  Adnexa no mass, fullness, tenderness.     Pelvic floor strength II/V  Pelvic floor musculature: Right levator non-tender, Right obturator non-tender, Left levator non-tender, Left obturator non-tender  POP-Q:   POP-Q  -3                                            Aa   -3                                           Ba  -6                                              C   2.5                                            Gh  4                                            Pb  6                                            tvl   -2.5                                            Ap  -2.5                                            Bp                                                 D     Rectal Exam:  Normal external rectum  Post-Void Residual (PVR) by Bladder Scan: In order to evaluate bladder emptying, we discussed obtaining a postvoid residual and she agreed to this procedure.  Procedure: The ultrasound unit was placed on the patient's abdomen in the suprapubic region after the patient had voided. A PVR of 15 ml was obtained by bladder scan.  Laboratory Results: POC urine: negative leukocytes, positive nitrites   ASSESSMENT AND PLAN Ms. Prickett is a 70 y.o. with:  1. SUI (stress urinary incontinence, female)   2. Overactive bladder   3. Dysuria   4. Urinary frequency    SUI - For treatment of stress urinary incontinence,  non-surgical options include expectant management, weight loss, physical therapy, as well as a pessary.  Surgical options include a  midurethral sling,  Burch urethropexy, and transurethral injection of a bulking agent. - She is considering repeat sling vs a pessary. She is not interested in doing bulkamid again. Handouts provided on options.   2. OAB - For refractory cases posterior tibial nerve stimulation, sacral neuromodulation, and intravesical botulinum toxin injection. We reviewed that these will not treat SUI symptoms.  - She would like to focus on SUI first as that is more bothersome to her.   - Urine sent for culture today due to presence of nitrites   Jaquita Folds, MD

## 2021-08-30 NOTE — Addendum Note (Signed)
Addended by: Jaquita Folds on: 08/30/2021 03:52 PM   Modules accepted: Orders

## 2021-09-02 LAB — URINE CULTURE: Culture: >=100000 — AB

## 2021-09-02 MED ORDER — SULFAMETHOXAZOLE-TRIMETHOPRIM 800-160 MG PO TABS: 1.0000 | ORAL_TABLET | Freq: Two times a day (BID) | ORAL | 0 refills | Status: AC

## 2021-09-02 NOTE — Progress Notes (Signed)
Monique Thomas is a 70 y.o. female was contacted and notified of the message below.

## 2021-09-02 NOTE — Addendum Note (Signed)
Addended by: Jaquita Folds on: 09/02/2021 10:10 AM   Modules accepted: Orders

## 2021-09-09 DIAGNOSIS — Z1231 Encounter for screening mammogram for malignant neoplasm of breast: Secondary | ICD-10-CM | POA: Diagnosis not present

## 2021-09-09 LAB — HM MAMMOGRAPHY

## 2021-09-28 ENCOUNTER — Ambulatory Visit: Payer: Medicare PPO | Admitting: Adult Health

## 2021-09-28 ENCOUNTER — Encounter: Payer: Self-pay | Admitting: Adult Health

## 2021-09-28 ENCOUNTER — Other Ambulatory Visit: Payer: Self-pay

## 2021-09-28 VITALS — BP 138/74 | HR 67 | Temp 97.3°F | Wt 133.0 lb

## 2021-09-28 DIAGNOSIS — E039 Hypothyroidism, unspecified: Secondary | ICD-10-CM

## 2021-09-28 DIAGNOSIS — G44209 Tension-type headache, unspecified, not intractable: Secondary | ICD-10-CM | POA: Diagnosis not present

## 2021-09-28 DIAGNOSIS — R3915 Urgency of urination: Secondary | ICD-10-CM

## 2021-09-28 DIAGNOSIS — M26623 Arthralgia of bilateral temporomandibular joint: Secondary | ICD-10-CM

## 2021-09-28 DIAGNOSIS — R300 Dysuria: Secondary | ICD-10-CM | POA: Diagnosis not present

## 2021-09-28 MED ORDER — TIZANIDINE HCL 4 MG PO TABS: 2.0000 mg | ORAL_TABLET | Freq: Three times a day (TID) | ORAL | 0 refills | Status: DC

## 2021-09-28 NOTE — Progress Notes (Signed)
Assessment and Plan:   Frederick was seen today for headache.  Diagnoses and all orders for this visit:  Tension headache Possible r/t TMJ, new pillow  Switch back Neck exercises reviewed, suggested massage, dry needling referral (declines at this time) Try heat to neck, ROM/stretching exercises Do aleve more frequently in short term, will try muscle relaxer Follow up if not resolving, would recommend PT for dry needling, consider imaging OV if change in pain character, Will go to the ER if worsening headache, changes vision/speech, imbalance, weakness. -     tiZANidine (ZANAFLEX) 4 MG tablet; Take 0.5-1 tablets (2-4 mg total) by mouth 3 (three) times daily.  TMJ tenderness, bilateral information given to the patient, no gum/decrease hard foods, warm wet wash clothes, decrease stress,  can do massage, and exercise.  Very important to follow up with dentist  Dysuria Urinary urgency Recently completed abx, follows with uro/gyn, ? Treatment failure vs OAB or other -  Will wait for culture results prior to proceeding with treatment and forward results to Dr. Wannetta Sender Push fluids, ok to take azo for 24-48 hours then stop Contact office if any hematuria, fever/chills, worsening sx in the interim and will start presumptive treatment  -     Urinalysis, Routine w reflex microscopic -     Urine Culture  Hypothyroidism, unspecified type continue medications the same pending lab results reminded to take on an empty stomach 30-82mins before food.  check TSH level -     TSH  Further disposition pending results of labs. Discussed med's effects and SE's.   Over 30 minutes of exam, counseling, chart review, and critical decision making was performed.   Future Appointments  Date Time Provider [place]  11/01/2021  9:45 AM Lavonna Monarch, MD [facility]  11/25/2021 10:00 AM Liane Comber, NP [facility] None  02/01/2022 10:30 AM Magda Bernheim, NP [facility] None     ------------------------------------------------------------------------------------------------------------------   HPI BP 138/74    Pulse 67    Temp (!) 97.3 F (36.3 C)    Wt 133 lb (60.3 kg)    SpO2 99%    BMI 25.97 kg/m  71 y.o.female presents for evaluation of headache and UTI sx.   She also reports waking up with daily headache in the last 4-5 days, "feels like tenderness all over," denies vision changes, photosensitivity, nausea. Describes as "just hurts." Pain is 4-5/10, improves to 1-2 with aleve, takes intermittently. She notes some lateral neck tenderness as well. Has chronic nasal congestion, has taken some sudafed with improvement. Denies sinus tenderness.   She reports 4-5 days of dark gold urine, urgency, frequency, intermittent dysuria. All sx are intermittent. Endorses urine odor and cloudiness. Denies perineal itching/burning or vaginal discharge. She denies fever/chills, flank pain, hematuria, abd pain or nausea.   She follow with uro/gyn, had recent culture on 08/30/2021 by Dr. Windy Canny, e coli with no resistance, completed bactrim. She reports did not have sx other than strong urine smell prior to that UA. Hasn't been advised organ prolapse, cystocele, fistula - does have OAB, urodynamic testing showed both stress incontinence and detrusor instability per her recent note.   She did change her dose, reduced to 1/2 tab on Tuesdays/Thursdays, 1 tab otherwise.  Lab Results  Component Value Date   TSH 0.16 (L) 07/27/2021     Past Medical History:  Diagnosis Date   Anemia    Anxiety    Arthritis    Back pain, chronic    Cancer (HCC)  2 weeks  ago    skin cancer  left anterior wrist   and right side of  face      Depression    Diverticulosis 09/25/2019   GERD (gastroesophageal reflux disease)    Hyperlipidemia    Hypothyroidism    Kidney stone    2015   Thyroid disease    Umbilical hernia    Unspecified vitamin D deficiency      Allergies  Allergen  Reactions   Celebrex [Celecoxib] Other (See Comments)    "panic attack"   Other Other (See Comments)    Skin bruises very easily and peels back   Topamax [Topiramate] Other (See Comments)    MEMORY   Zofran [Ondansetron] Other (See Comments)    Headache   Entex Lq [Phenylephrine-Guaifenesin] Rash   Fiorinal [Butalbital-Aspirin-Caffeine] Rash    Current Outpatient Medications on File Prior to Visit  Medication Sig   ALPRAZolam (XANAX) 1 MG tablet TAKE 1/2 TO 1 TABLET BY MOUTH EVERY NIGHT AT BEDTIME. LIMIT TO 5 DAYS PER WEEK TO AVOID ADDICTION   estradiol (ESTRACE) 1 MG tablet Take 1 mg by mouth daily.   levothyroxine (SYNTHROID) 88 MCG tablet Take 1 tablet daily except 1/2 tab on Tues/Sat for thyroid on empty stomach with water only.   ropinirole (REQUIP) 5 MG tablet Take  1 tablet   at Bedtime  for Restless Legs                              /              TAKE 1 TABLET BY MOUTH   No current facility-administered medications on file prior to visit.   Allergies:  Allergies  Allergen Reactions   Celebrex [Celecoxib] Other (See Comments)    "panic attack"   Other Other (See Comments)    Skin bruises very easily and peels back   Topamax [Topiramate] Other (See Comments)    MEMORY   Zofran [Ondansetron] Other (See Comments)    Headache   Entex Lq [Phenylephrine-Guaifenesin] Rash   Fiorinal [Butalbital-Aspirin-Caffeine] Rash   Surgical History:  She  has a past surgical history that includes Abdominal hysterectomy; Cesarean section; Hernia repair; Appendectomy; Tonsillectomy and adenoidectomy; Spine surgery; Laparoscopic lysis intestinal adhesions; Maximum access (mas)posterior lumbar interbody fusion (plif) 1 level (N/A, 04/16/2015); Anterior lat lumbar fusion (N/A, 04/07/2017); and Total knee arthroplasty (Left, 07/17/2018). Family History:  Herfamily history includes Asthma in her sister; Cancer in her maternal grandmother; Heart disease in her mother; Hyperlipidemia in her mother;  Thyroid disease in her mother. Social History:   reports that she has never smoked. She has never used smokeless tobacco. She reports that she does not drink alcohol and does not use drugs.   ROS: all negative except above.   Physical Exam:  BP 138/74    Pulse 67    Temp (!) 97.3 F (36.3 C)    Wt 133 lb (60.3 kg)    SpO2 99%    BMI 25.97 kg/m   General Appearance: Well nourished, in no apparent distress. Eyes: PERRLA, EOMs, conjunctiva no swelling or erythema Sinuses: No Frontal/maxillary tenderness ENT/Mouth: Ext aud canals clear, TMs without erythema, bulging. No erythema, swelling, or exudate on post pharynx.  Tonsils not swollen or erythematous. Hearing normal. + bil TMJ tenderness Neck: Supple, thyroid normal.  Respiratory: Respiratory effort normal, BS equal bilaterally without rales, rhonchi, wheezing or stridor.  Cardio: RRR with no MRGs. Brisk peripheral  pulses without edema.  Abdomen: Soft, + BS.  Non tender. No CVA tenderness.  Lymphatics: Non tender without lymphadenopathy.  Musculoskeletal: Full ROM, 5/5 strength, normal gait. Neck trap and sternocleidomastoid tenderness.  Skin: Warm, dry without rashes, lesions, ecchymosis.  Neuro: Cranial nerves intact. Normal muscle tone, no cerebellar symptoms. Sensation intact.  Psych: Awake and oriented X 3, normal affect, Insight and Judgment appropriate.     Izora Ribas, NP 9:18 AM [facility]

## 2021-09-28 NOTE — Patient Instructions (Signed)
Tension Headache, Adult A tension headache is a feeling of pain, pressure, or aching over the front and sides of the head. The pain can be dull, or it can feel tight. There are two types of tension headache: Episodic tension headache. This is when the headaches happen fewer than 15 days a month. Chronic tension headache. This is when the headaches happen more than 15 days a month during a 23-month period. A tension headache can last from 30 minutes to several days. It is the most common kind of headache. Tension headaches are not normally associated with nausea or vomiting, and they do not get worse with physical activity. What are the causes? The exact cause of this condition is not known. Tension headaches are often triggered by stress, anxiety, or depression. Other triggers may include: Alcohol. Too much caffeine or caffeine withdrawal. Respiratory infections, such as colds, flu, or sinus infections. Dental problems or teeth clenching. Fatigue. Holding your head and neck in the same position for a long period of time, such as while using a computer. Smoking. Arthritis of the neck. What are the signs or symptoms? Symptoms of this condition include: A feeling of pressure or tightness around the head. Dull, aching head pain. Pain over the front and sides of the head. Tenderness in the muscles of the head, neck, and shoulders. How is this diagnosed? This condition may be diagnosed based on your symptoms, your medical history, and a physical exam. If your symptoms are severe or unusual, you may have imaging tests, such as a CT scan or an MRI of your head. Your vision may also be checked. How is this treated? This condition may be treated with lifestyle changes and with medicines that help relieve symptoms. Follow these instructions at home: Managing pain Take over-the-counter and prescription medicines only as told by your health care provider. When you have a headache, lie down in a  dark, quiet room. If directed, put ice on your head and neck. To do this: Put ice in a plastic bag. Place a towel between your skin and the bag. Leave the ice on for 20 minutes, 2-3 times a day. Remove the ice if your skin turns bright red. This is very important. If you cannot feel pain, heat, or cold, you have a greater risk of damage to the area. If directed, apply heat to the back of your neck as often as told by your health care provider. Use the heat source that your health care provider recommends, such as a moist heat pack or a heating pad. Place a towel between your skin and the heat source. Leave the heat on for 20-30 minutes. Remove the heat if your skin turns bright red. This is especially important if you are unable to feel pain, heat, or cold. You have a greater risk of getting burned. Eating and drinking Eat meals on a regular schedule. If you drink alcohol: Limit how much you have to: 0-1 drink a day for women who are not pregnant. 0-2 drinks a day for men. Know how much alcohol is in your drink. In the [place] one drink equals one 12 oz bottle of beer (355 mL), one 5 oz glass of wine (148 mL), or one 1 oz glass of hard liquor (44 mL). Drink enough fluid to keep your urine pale yellow. Decrease your caffeine intake, or stop using caffeine. Lifestyle Get 7-9 hours of sleep each night, or get the amount of sleep recommended by your health care provider. At bedtime,  remove computers, phones, and tablets from your room. Find ways to manage your stress. This may include: Exercise. Deep breathing exercises. Yoga. Listening to music. Positive mental imagery. Try to sit up straight and avoid tensing your muscles. Do not use any products that contain nicotine or tobacco. These include cigarettes, chewing tobacco, and vaping devices, such as e-cigarettes. If you need help quitting, ask your health care provider. General instructions  Avoid any headache triggers. Keep a journal to  help find out what may trigger your headaches. For example, write down: What you eat and drink. How much sleep you get. Any change to your diet or medicines. Keep all follow-up visits. This is important. Contact a health care provider if: Your headache does not get better. Your headache comes back. You are sensitive to sounds, light, or smells because of a headache. You have nausea or you vomit. Your stomach hurts. Get help right away if: You suddenly develop a severe headache, along with any of the following: A stiff neck. Nausea and vomiting. Confusion. Weakness in one part or one side of your body. Double vision or loss of vision. Shortness of breath. Rash. Unusual sleepiness. Fever or chills. Trouble speaking. Pain in your eye or ear. Trouble walking or balancing. Feeling faint or passing out. Summary A tension headache is a feeling of pain, pressure, or aching over the front and sides of the head. A tension headache can last from 30 minutes to several days. It is the most common kind of headache. This condition may be diagnosed based on your symptoms, your medical history, and a physical exam. This condition may be treated with lifestyle changes and with medicines that help relieve symptoms. This information is not intended to replace advice given to you by your health care provider. Make sure you discuss any questions you have with your health care provider. Document Revised: 06/04/2020 Document Reviewed: 06/04/2020 Elsevier Patient Education  2022 [place].     What is the TMJ? The temporomandibular (tem-PUH-ro-man-DIB-yoo-ler) joint, or the TMJ, connects the upper and lower jawbones. This joint allows the jaw to open wide and move back and forth when you chew, talk, or yawn.There are also several muscles that help this joint move. There can be muscle tightness and pain in the muscle that can cause several symptoms.  What causes TMJ pain? There are many causes of  TMJ pain. Repeated chewing (for example, chewing gum) and clenching your teeth can cause pain in the joint. Some TMJ pain has no obvious cause. What can I do to ease the pain? There are many things you can do to help your pain get better. When you have pain:  Eat soft foods and stay away from chewy foods (for example, taffy) Try to use both sides of your mouth to chew Don't chew gum Massage Don't open your mouth wide (for example, during yawning or singing) Don't bite your cheeks or fingernails Lower your amount of stress and worry Applying a warm, damp washcloth to the joint may help. Over-the-counter pain medicines such as ibuprofen (one brand: Advil) or acetaminophen (one brand: Tylenol) might also help. Do not use these medicines if you are allergic to them or if your doctor told you not to use them. How can I stop the pain from coming back? When your pain is better, you can do these exercises to make your muscles stronger and to keep the pain from coming back:  Resisted mouth opening: Place your thumb or two fingers under your  chin and open your mouth slowly, pushing up lightly on your chin with your thumb. Hold for three to six seconds. Close your mouth slowly. Resisted mouth closing: Place your thumbs under your chin and your two index fingers on the ridge between your mouth and the bottom of your chin. Push down lightly on your chin as you close your mouth. Tongue up: Slowly open and close your mouth while keeping the tongue touching the roof of the mouth. Side-to-side jaw movement: Place an object about one fourth of an inch thick (for example, two tongue depressors) between your front teeth. Slowly move your jaw from side to side. Increase the thickness of the object as the exercise becomes easier Forward jaw movement: Place an object about one fourth of an inch thick between your front teeth and move the bottom jaw forward so that the bottom teeth are in front of the top teeth. Increase  the thickness of the object as the exercise becomes easier. These exercises should not be painful. If it hurts to do these exercises, stop doing them and talk to your family doctor.

## 2021-09-30 ENCOUNTER — Other Ambulatory Visit: Payer: Self-pay | Admitting: Adult Health

## 2021-09-30 LAB — URINALYSIS, ROUTINE W REFLEX MICROSCOPIC
Bilirubin Urine: NEGATIVE
Glucose, UA: NEGATIVE
Hgb urine dipstick: NEGATIVE
Hyaline Cast: NONE SEEN /LPF
Ketones, ur: NEGATIVE
Nitrite: NEGATIVE
Protein, ur: NEGATIVE
RBC / HPF: NONE SEEN /HPF (ref 0–2)
Specific Gravity, Urine: 1.012 (ref 1.001–1.035)
pH: 6.5 (ref 5.0–8.0)

## 2021-09-30 LAB — URINE CULTURE
MICRO NUMBER:: 12851287
SPECIMEN QUALITY:: ADEQUATE

## 2021-09-30 LAB — TSH: TSH: 1.77 mIU/L (ref 0.40–4.50)

## 2021-09-30 LAB — MICROSCOPIC MESSAGE

## 2021-09-30 MED ORDER — CIPROFLOXACIN HCL 250 MG PO TABS: 250.0000 mg | ORAL_TABLET | Freq: Two times a day (BID) | ORAL | 0 refills | Status: DC

## 2021-10-02 ENCOUNTER — Other Ambulatory Visit: Payer: Self-pay | Admitting: Adult Health

## 2021-10-11 ENCOUNTER — Encounter: Payer: Self-pay | Admitting: Internal Medicine

## 2021-11-01 ENCOUNTER — Ambulatory Visit: Payer: Medicare PPO | Admitting: Dermatology

## 2021-11-01 ENCOUNTER — Other Ambulatory Visit: Payer: Self-pay

## 2021-11-01 DIAGNOSIS — Z85828 Personal history of other malignant neoplasm of skin: Secondary | ICD-10-CM | POA: Diagnosis not present

## 2021-11-01 DIAGNOSIS — L570 Actinic keratosis: Secondary | ICD-10-CM

## 2021-11-01 DIAGNOSIS — L820 Inflamed seborrheic keratosis: Secondary | ICD-10-CM

## 2021-11-01 DIAGNOSIS — D485 Neoplasm of uncertain behavior of skin: Secondary | ICD-10-CM

## 2021-11-01 MED ORDER — TOLAK 4 % EX CREA: TOPICAL_CREAM | CUTANEOUS | 0 refills | Status: DC

## 2021-11-01 NOTE — Patient Instructions (Addendum)
PLEASE CALL [facility] REGARDING PRESCRIPTION [phone]  CALL us IF [place].    Biopsy, Surgery (Curettage) & Surgery (Excision) Aftercare Instructions  1. Okay to remove bandage in 24 hours  2. Wash area with soap and water  3. Apply Vaseline to area twice daily until healed (Not Neosporin)  4. Okay to cover with a Band-Aid to decrease the chance of infection or prevent irritation from clothing; also it's okay to uncover lesion at home.  5. Suture instructions: return to our office in 7-10 or 10-14 days for a nurse visit for suture removal. Variable healing with sutures, if pain or itching occurs call our office. It's okay to shower or bathe 24 hours after sutures are given.  6. The following risks may occur after a biopsy, curettage or excision: bleeding, scarring, discoloration, recurrence, infection (redness, yellow drainage, pain or swelling).  7. For questions, concerns and results call our office at [place] before 4pm & Friday before 3pm. Biopsy results will be available in 1 week.

## 2021-11-02 ENCOUNTER — Encounter: Payer: Self-pay | Admitting: Dermatology

## 2021-11-08 ENCOUNTER — Encounter: Payer: Self-pay | Admitting: Dermatology

## 2021-11-08 DIAGNOSIS — M1811 Unilateral primary osteoarthritis of first carpometacarpal joint, right hand: Secondary | ICD-10-CM | POA: Diagnosis not present

## 2021-11-08 DIAGNOSIS — M79644 Pain in right finger(s): Secondary | ICD-10-CM | POA: Insufficient documentation

## 2021-11-08 NOTE — Progress Notes (Signed)
° °  Follow-Up Visit   Subjective  Monique Thomas is a 71 y.o. female who presents for the following: Follow-up (F/u Left forearm /Pt also has a spot of concern in the same area on the left forearm ).  Regrowth of the left forearm, check other spots Location:  Duration:  Quality:  Associated Signs/Symptoms: Modifying Factors:  Severity:  Timing: Context:   Objective  Well appearing patient in no apparent distress; mood and affect are within normal limits. Head - Anterior (Face) Multiple nonhypertrophic actinic keratoses  Left Forearm - Anterior Hornlike 4 mm crust       Right Shoulder - Anterior No sign residual carcinoma    All sun exposed areas plus back examined.   Assessment & Plan    Actinic keratosis Head - Anterior (Face)  Discussed observation versus PDT versus topical fluorouracil; patient chose to go with topical fluorouracil.  Goal will be 28 total applications of Tolak but if nightly application causes too much irritation she will contact me and we will modify the treatment.  Fluorouracil (TOLAK) 4 % CREA - Head - Anterior (Face) APPLY TO FACE AT NIGHT FOR 28 DAYS  Neoplasm of uncertain behavior of skin Left Forearm - Anterior  Skin / nail biopsy Type of biopsy: tangential   Informed consent: discussed and consent obtained   Timeout: patient name, date of birth, surgical site, and procedure verified   Anesthesia: the lesion was anesthetized in a standard fashion   Anesthetic:  1% lidocaine w/ epinephrine 1-100,000 local infiltration Instrument used: flexible razor blade   Hemostasis achieved with: ferric subsulfate and electrodesiccation   Outcome: patient tolerated procedure well   Post-procedure details: wound care instructions given    Specimen 1 - Surgical pathology Differential Diagnosis: R/O BCC VS SCC - CAUTERY ONLY   Check Margins: No  After shave biopsy the base was cauterized.  Personal history of skin cancer Right Shoulder -  Anterior  Check as needed change      I, Lavonna Monarch, MD, have reviewed all documentation for this visit.  The documentation on 11/08/21 for the exam, diagnosis, procedures, and orders are all accurate and complete.

## 2021-11-17 ENCOUNTER — Other Ambulatory Visit: Payer: Self-pay

## 2021-11-17 ENCOUNTER — Telehealth: Payer: Self-pay | Admitting: Dermatology

## 2021-11-17 ENCOUNTER — Encounter: Payer: Self-pay | Admitting: Nurse Practitioner

## 2021-11-17 ENCOUNTER — Ambulatory Visit (INDEPENDENT_AMBULATORY_CARE_PROVIDER_SITE_OTHER): Payer: Medicare PPO | Admitting: Nurse Practitioner

## 2021-11-17 VITALS — BP 126/60 | HR 78 | Temp 97.7°F | Wt 135.6 lb

## 2021-11-17 DIAGNOSIS — Z1152 Encounter for screening for COVID-19: Secondary | ICD-10-CM

## 2021-11-17 DIAGNOSIS — J069 Acute upper respiratory infection, unspecified: Secondary | ICD-10-CM

## 2021-11-17 DIAGNOSIS — J3089 Other allergic rhinitis: Secondary | ICD-10-CM

## 2021-11-17 LAB — POC COVID19 BINAXNOW: SARS Coronavirus 2 Ag: NEGATIVE

## 2021-11-17 MED ORDER — PSEUDOEPHEDRINE HCL 30 MG PO TABS: 30.0000 mg | ORAL_TABLET | Freq: Four times a day (QID) | ORAL | 2 refills | Status: DC | PRN

## 2021-11-17 MED ORDER — ALBUTEROL SULFATE HFA 108 (90 BASE) MCG/ACT IN AERS: 2.0000 | INHALATION_SPRAY | Freq: Four times a day (QID) | RESPIRATORY_TRACT | 2 refills | Status: DC | PRN

## 2021-11-17 MED ORDER — PREDNISONE 20 MG PO TABS
ORAL_TABLET | ORAL | 0 refills | Status: AC
Start: 2021-11-17 — End: 2021-11-28

## 2021-11-17 MED ORDER — TRIAMCINOLONE ACETONIDE 0.1 % EX CREA: 1.0000 "application " | TOPICAL_CREAM | Freq: Two times a day (BID) | CUTANEOUS | 1 refills | Status: DC | PRN

## 2021-11-17 NOTE — Telephone Encounter (Signed)
Patient calling to see if ST will send in medication to go with Tolak treatment. She states that she has been using Tolak for 1 1/2 weeks and she would like a RX sent to [facility] in [place]. She also asked if she should continue Tolak treatment? I informed her nurse would call back to answer her questions. ?

## 2021-11-17 NOTE — Patient Instructions (Signed)
Upper Respiratory Infection, Adult ?An upper respiratory infection (URI) is a common viral infection of the nose, throat, and upper air passages that lead to the lungs. The most common type of URI is the common cold. URIs usually get better on their own, without medical treatment. ?What are the causes? ?A URI is caused by a virus. You may catch a virus by: ?Breathing in droplets from an infected person's cough or sneeze. ?Touching something that has been exposed to the virus (is contaminated) and then touching your mouth, nose, or eyes. ?What increases the risk? ?You are more likely to get a URI if: ?You are very young or very old. ?You have close contact with others, such as at work, school, or a health care facility. ?You smoke. ?You have long-term (chronic) heart or lung disease. ?You have a weakened disease-fighting system (immune system). ?You have nasal allergies or asthma. ?You are experiencing a lot of stress. ?You have poor nutrition. ?What are the signs or symptoms? ?A URI usually involves some of the following symptoms: ?Runny or stuffy (congested) nose. ?Cough. ?Sneezing. ?Sore throat. ?Headache. ?Fatigue. ?Fever. ?Loss of appetite. ?Pain in your forehead, behind your eyes, and over your cheekbones (sinus pain). ?Muscle aches. ?Redness or irritation of the eyes. ?Pressure in the ears or face. ?How is this diagnosed? ?This condition may be diagnosed based on your medical history and symptoms, and a physical exam. Your health care provider may use a swab to take a mucus sample from your nose (nasal swab). This sample can be tested to determine what virus is causing the illness. ?How is this treated? ?URIs usually get better on their own within 7-10 days. Medicines cannot cure URIs, but your health care provider may recommend certain medicines to help relieve symptoms, such as: ?Over-the-counter cold medicines. ?Cough suppressants. Coughing is a type of defense against infection that helps to clear the  respiratory system, so take these medicines only as recommended by your health care provider. ?Fever-reducing medicines. ?Follow these instructions at home: ?Activity ?Rest as needed. ?If you have a fever, stay home from work or school until your fever is gone or until your health care provider says your URI cannot spread to other people (is no longer contagious). Your health care provider may have you wear a face mask to prevent your infection from spreading. ?Relieving symptoms ?Gargle with a mixture of salt and water 3-4 times a day or as needed. To make salt water, completely dissolve ?-1 tsp (3-6 g) of salt in 1 cup (237 mL) of warm water. ?Use a cool-mist humidifier to add moisture to the air. This can help you breathe more easily. ?Eating and drinking ? ?Drink enough fluid to keep your urine pale yellow. ?Eat soups and other clear broths. ?General instructions ? ?Take over-the-counter and prescription medicines only as told by your health care provider. These include cold medicines, fever reducers, and cough suppressants. ?Do not use any products that contain nicotine or tobacco. These products include cigarettes, chewing tobacco, and vaping devices, such as e-cigarettes. If you need help quitting, ask your health care provider. ?Stay away from secondhand smoke. ?Stay up to date on all immunizations, including the yearly (annual) flu vaccine. ?Keep all follow-up visits. This is important. ?How to prevent the spread of infection to others ?URIs can be contagious. To prevent the infection from spreading: ?Wash your hands with soap and water for at least 20 seconds. If soap and water are not available, use hand sanitizer. ?Avoid touching your mouth,  face, eyes, or nose. ?Cough or sneeze into a tissue or your sleeve or elbow instead of into your hand or into the air. ? ?Contact a health care provider if: ?You are getting worse instead of better. ?You have a fever or chills. ?Your mucus is brown or red. ?You have  yellow or brown discharge coming from your nose. ?You have pain in your face, especially when you bend forward. ?You have swollen neck glands. ?You have pain while swallowing. ?You have white areas in the back of your throat. ?Get help right away if: ?You have shortness of breath that gets worse. ?You have severe or persistent: ?Headache. ?Ear pain. ?Sinus pain. ?Chest pain. ?You have chronic lung disease along with any of the following: ?Making high-pitched whistling sounds when you breathe, most often when you breathe out (wheezing). ?Prolonged cough (more than 14 days). ?Coughing up blood. ?A change in your usual mucus. ?You have a stiff neck. ?You have changes in your: ?Vision. ?Hearing. ?Thinking. ?Mood. ?These symptoms may be an emergency. Get help right away. Call 911. ?Do not wait to see if the symptoms will go away. ?Do not drive yourself to the hospital. ?Summary ?An upper respiratory infection (URI) is a common infection of the nose, throat, and upper air passages that lead to the lungs. ?A URI is caused by a virus. ?URIs usually get better on their own within 7-10 days. ?Medicines cannot cure URIs, but your health care provider may recommend certain medicines to help relieve symptoms. ?This information is not intended to replace advice given to you by your health care provider. Make sure you discuss any questions you have with your health care provider. ?Document Revised: 04/07/2021 Document Reviewed: 04/07/2021 ?Elsevier Patient Education ? [place]. ? ?

## 2021-11-17 NOTE — Telephone Encounter (Signed)
Patient aware of Dr Delma Officer message and will pick up the TAC 0.1%. I advised her to look at the photos online and compare. She feels like she can push through and get her total with the tac every other night. I reminded her to call if she had any questions. ?

## 2021-11-17 NOTE — Progress Notes (Signed)
Assessment and Plan: ? ?Monique Thomas was seen today for acute visit. ? ?Diagnoses and all orders for this visit: ? ?Encounter for screening for COVID-19 ?-     POC COVID-19 ? Negative ? ?URI, acute ?Push fluids, if no improvement of symptoms within the next 5 days notify the office ?D/C current dose of Prednisone and begin taper prescribed today ?-     predniSONE (DELTASONE) 20 MG tablet; 3 tablets daily with food for 3 days, 2 tabs daily for 3 days, 1 tab a day for 5 days. ?-     albuterol (VENTOLIN HFA) 108 (90 Base) MCG/ACT inhaler; Inhale 2 puffs into the lungs every 6 (six) hours as needed for wheezing or shortness of breath. ?-     pseudoephedrine (SUDAFED) 30 MG tablet; Take 1 tablet (30 mg total) by mouth every 6 (six) hours as needed for congestion. ? ?Environmental and seasonal allergies ?Continue Zyrtec daily ?  ? ? ?Further disposition pending results of labs. Discussed med's effects and SE's.   ?Over 20 minutes of exam, counseling, chart review, and critical decision making was performed.  ? ?Future Appointments  ?Date Time Provider [place]  ?11/19/2021  2:40 PM Jaquita Folds, MD [facility]  ?02/01/2022 10:30 AM Ellinor Test, Townsend Roger, NP [facility] None  ? ? ?------------------------------------------------------------------------------------------------------------------ ? ? ?HPI ?BP 126/60   Pulse 78   Temp 97.7 ?F (36.5 ?C)   Wt 135 lb 9.6 oz (61.5 kg)   SpO2 96%   BMI 26.48 kg/m?  ? ?71 y.o.female presents for Chest congestion, dry cough, nasal drainage of clear mucus x 2 days.  Denies fever, nausea , vomiting and diarrhea. She is currently on prednisone 5 mg daily for inflammation of thumb. She is taking Zyrtec daily currently for allergies- she does alternate between Zyrtec and osteopenia ? ?Past Medical History:  ?Diagnosis Date  ? Anemia   ? Anxiety   ? Arthritis   ? Back pain, chronic   ? Cancer ([place])  2 weeks ago   ? skin cancer  left anterior wrist   and right side of  face     ?  Depression   ? Diverticulosis 09/25/2019  ? GERD (gastroesophageal reflux disease)   ? Hyperlipidemia   ? Hypothyroidism   ? Kidney stone   ? 2015  ? Thyroid disease   ? Umbilical hernia   ? Unspecified vitamin D deficiency   ?  ? ?Allergies  ?Allergen Reactions  ? Celebrex [Celecoxib] Other (See Comments)  ?  "panic attack"  ? Other Other (See Comments)  ?  Skin bruises very easily and peels back  ? Topamax [Topiramate] Other (See Comments)  ?  MEMORY  ? Zofran [Ondansetron] Other (See Comments)  ?  Headache  ? Entex Lq [Phenylephrine-Guaifenesin] Rash  ? Fiorinal [Butalbital-Aspirin-Caffeine] Rash  ? ? ?Current Outpatient Medications on File Prior to Visit  ?Medication Sig  ? ALPRAZolam (XANAX) 1 MG tablet TAKE 1/2 TO 1 TABLET BY MOUTH EVERY NIGHT AT BEDTIME. LIMIT TO 5 DAYS PER WEEK TO AVOID ADDICTION  ? estradiol (ESTRACE) 1 MG tablet Take 1 mg by mouth daily.  ? Fluorouracil (TOLAK) 4 % CREA APPLY TO FACE AT NIGHT FOR 28 DAYS  ? levothyroxine (SYNTHROID) 88 MCG tablet Take 1 tablet daily except 1/2 tab on Tues/Sat for thyroid on empty stomach with water only.  ? PREDNISONE PO Take by mouth. 5 mg, only has 5 days left  ? ropinirole (REQUIP) 5 MG tablet Take  1 tablet  at Bedtime  for Restless Legs                              /              TAKE 1 TABLET BY MOUTH  ? amoxicillin (AMOXIL) 500 MG capsule Take by mouth. (Patient not taking: Reported on 11/17/2021)  ? ciprofloxacin (CIPRO) 250 MG tablet Take 1 tablet (250 mg total) by mouth 2 (two) times daily. (Patient not taking: Reported on 11/17/2021)  ? rosuvastatin (CRESTOR) 5 MG tablet Take by mouth.  ? tiZANidine (ZANAFLEX) 4 MG tablet Take 0.5-1 tablets (2-4 mg total) by mouth 3 (three) times daily.  ? Trospium Chloride 60 MG CP24 Take by mouth. (Patient not taking: Reported on 11/17/2021)  ? ?No current facility-administered medications on file prior to visit.  ? ? ?ROS: all negative except above.  ? ?Physical Exam: ? ?BP 126/60   Pulse 78   Temp 97.7 ?F (36.5  ?C)   Wt 135 lb 9.6 oz (61.5 kg)   SpO2 96%   BMI 26.48 kg/m?  ? ?General Appearance: Well nourished, in no apparent distress. ?Eyes: PERRLA, EOMs, conjunctiva no swelling or erythema ?Sinuses: No Frontal/maxillary tenderness ?ENT/Mouth: Ext aud canals clear, Left TM hole.Right TM no bulging or erythema. No erythema, swelling, or exudate on post pharynx.  Tonsils not swollen or erythematous. Hearing normal.  ?Neck: Supple, thyroid normal.  ?Respiratory: Respiratory effort normal, BS equal bilaterally without rales, rhonchi, wheezing or stridor.  ?Cardio: RRR with no MRGs. Brisk peripheral pulses without edema.  ?Abdomen: Soft, + BS.  Non tender, no guarding, rebound, hernias, masses. ?Lymphatics: Non tender without lymphadenopathy.  ?Musculoskeletal: Full ROM, 5/5 strength, normal gait.  ?Skin: Warm, dry without rashes, lesions, ecchymosis.  ?Neuro: Cranial nerves intact. Normal muscle tone, no cerebellar symptoms. Sensation intact.  ?Psych: Awake and oriented X 3, normal affect, Insight and Judgment appropriate.  ?  ? ?Magda Bernheim, NP ?11:08 AM ?[facility] ? ?

## 2021-11-19 ENCOUNTER — Encounter: Payer: Self-pay | Admitting: Obstetrics and Gynecology

## 2021-11-19 ENCOUNTER — Ambulatory Visit (INDEPENDENT_AMBULATORY_CARE_PROVIDER_SITE_OTHER): Payer: Medicare PPO | Admitting: Obstetrics and Gynecology

## 2021-11-19 ENCOUNTER — Other Ambulatory Visit: Payer: Self-pay

## 2021-11-19 VITALS — BP 146/85 | HR 102

## 2021-11-19 DIAGNOSIS — N393 Stress incontinence (female) (male): Secondary | ICD-10-CM

## 2021-11-19 NOTE — Progress Notes (Signed)
[facility] ?Return Visit ? ?SUBJECTIVE  ?History of Present Illness: ?Monique Thomas is a 71 y.o. female seen in follow-up for SUI and OAB. She was considering her options for leakage. ?Most of her leakage is still with cough and sneeze. Still has some urgency and frequency but not as bothersome. Usually happens if she waits too long to go to the bathroom.  ? ?Past Medical History: ?Patient  has a past medical history of Anemia, Anxiety, Arthritis, Back pain, chronic, Cancer ([facility]) ( 2 weeks ago ), Depression, Diverticulosis (09/25/2019), GERD (gastroesophageal reflux disease), Hyperlipidemia, Hypothyroidism, Kidney stone, Thyroid disease, Umbilical hernia, and Unspecified vitamin D deficiency.  ? ?Past Surgical History: ?She  has a past surgical history that includes Abdominal hysterectomy; Cesarean section; Hernia repair; Appendectomy; Tonsillectomy and adenoidectomy; Spine surgery; Laparoscopic lysis intestinal adhesions; Maximum access (mas)posterior lumbar interbody fusion (plif) 1 level (N/A, 04/16/2015); Anterior lat lumbar fusion (N/A, 04/07/2017); and Total knee arthroplasty (Left, 07/17/2018).  ? ?Medications: ?She has a current medication list which includes the following prescription(s): albuterol, alprazolam, estradiol, tolak, levothyroxine, prednisone, pseudoephedrine, ropinirole, and triamcinolone cream.  ? ?Allergies: ?Patient is allergic to celebrex [celecoxib], other, topamax [topiramate], zofran [ondansetron], entex lq [phenylephrine-guaifenesin], and fiorinal [butalbital-aspirin-caffeine].  ? ?Social History: ?Patient  reports that she has never smoked. She has never used smokeless tobacco. She reports that she does not drink alcohol and does not use drugs.  ?  ?  ?OBJECTIVE  ?  ? ?Physical Exam: ?Vitals:  ? 11/19/21 1435  ?BP: (!) 146/85  ?Pulse: (!) 102  ? ?Gen: No apparent distress, A&O x 3. ? ?Detailed Urogynecologic Evaluation:  ?Deferred. Prior exam showed: ? ?POP-Q:  ?  ?POP-Q ?   ?-3  ?                                          Aa   ?-3 ?                                          Ba   ?-6  ?                                            C  ?  ?2.5  ?                                          Gh   ?4  ?                                          Pb   ?6  ?                                          tvl  ?  ?-2.5  ?  Ap   ?-2.5  ?                                          Bp   ?   ?                                            D  ?  ?   ? ?ASSESSMENT AND PLAN  ?  ?Monique Thomas is a 70 y.o. with:  ?1. SUI (stress urinary incontinence, female)   ? ?- She would like to proceed with midurethral sling surgery for SUI.  ? ?Plan for surgery: Exam under anesthesia, midurethral sling, cystoscopy ? ?- We reviewed the patient's specific anatomic and functional findings, with the assistance of diagrams, and together finalized the above procedure. The planned surgical procedures were discussed along with the surgical risks outlined below, which were also provided on a detailed handout. Additional treatment options including expectant management, conservative management, medical management were discussed where appropriate.  We reviewed the benefits and risks of each treatment option.  ? ?General Surgical Risks: ?For all procedures, there are risks of bleeding, infection, damage to surrounding organs including but not limited to bowel, bladder, blood vessels, ureters and nerves, and need for further surgery if an injury were to occur. These risks are all low with minimally invasive surgery.  Very rare risks include blood transfusion, blood clot, heart attack, pneumonia, or death.  ? ?There is also a risk of short-term postoperative urinary retention with need to use a catheter. About half of patients need to go home from surgery with a catheter, which is then later removed in the office. The risk of long-term need for a catheter is very low. There is also a risk of worsening of  overactive bladder.  ? ?Sling: ?The effectiveness of a midurethral vaginal mesh sling is approximately 85%, and thus, there will be times when you may leak urine after surgery, especially if your bladder is full or if you have a strong cough. There is a balance between making the sling tight enough to treat your leakage but not too tight so that you have long-term difficulty emptying your bladder. A mesh sling will not directly treat overactive bladder/urge incontinence and may worsen it.  There is an FDA safety notification on vaginal mesh procedures for prolapse but NOT mesh slings. We have extensive experience and training with mesh placement and we have close postoperative follow up to identify any potential complications from mesh. It is important to realize that this mesh is a permanent implant that cannot be easily removed. There are rare risks of mesh exposure (2-4%), pain with intercourse (0-7%), and infection (<1%). The risk of mesh exposure if more likely in a woman with risks for poor healing (prior radiation, poorly controlled diabetes, or immunocompromised). The risk of new or worsened chronic pain after mesh implant is more common in women with baseline chronic pain and/or poorly controlled anxiety or depression. Approximately 2-4% of patients will experience longer-term post-operative voiding dysfunction that may require surgical revision of the sling. We also reviewed that postoperatively, her stream may not be as strong as before surgery.  ? ?- For preop Visit:  She is required to have a visit within 30 days of her surgery.  ? ?  Today we reviewed pre-operative preparation, peri-operative expectations, and post-operative instructions/recovery.   ? ?- Medical clearance: not required  ?- Anticoagulant use: No ?- Medicaid Hysterectomy form: n/a ?- Accepts blood transfusion: Yes ?- Expected length of stay: outpatient ? ?Request sent for surgery scheduling.  ? ?Monique Folds, MD ? ? ?

## 2021-11-25 ENCOUNTER — Encounter: Payer: Medicare PPO | Admitting: Adult Health

## 2021-12-06 ENCOUNTER — Telehealth: Payer: Self-pay | Admitting: Dermatology

## 2021-12-06 NOTE — Telephone Encounter (Signed)
Patient is calling to say that her face is still red, blistered, and swollen from using Tolak. Patient states that her last application was last Thursday 12/02/2021.  Patient states that she is having bladder surgery this Thursday 12/09/2021 and wants to know what she can do to clear her face. ?

## 2021-12-07 ENCOUNTER — Encounter (HOSPITAL_BASED_OUTPATIENT_CLINIC_OR_DEPARTMENT_OTHER): Payer: Self-pay | Admitting: Obstetrics and Gynecology

## 2021-12-07 ENCOUNTER — Other Ambulatory Visit: Payer: Self-pay

## 2021-12-07 DIAGNOSIS — L989 Disorder of the skin and subcutaneous tissue, unspecified: Secondary | ICD-10-CM

## 2021-12-07 HISTORY — DX: Disorder of the skin and subcutaneous tissue, unspecified: L98.9

## 2021-12-07 MED ORDER — TRIAMCINOLONE ACETONIDE 0.1 % EX OINT: TOPICAL_OINTMENT | CUTANEOUS | 0 refills | Status: DC

## 2021-12-07 NOTE — Telephone Encounter (Signed)
Spoke with Dr Denna Haggard and he advised patient to switch to TAC 0.1% OINTMENT for redness and cold compresses. Patient aware and will call the office if she has any further concerns. ?

## 2021-12-07 NOTE — Progress Notes (Signed)
Spoke w/ via phone for pre-op interview---pt ?Lab needs dos----  none per anesthesia, surgery orders req dr schroeder epic ib             ?Lab results------none ?COVID test -----patient states asymptomatic no test needed ?Arrive at -------830 am 12-09-2021 ?NPO after MN NO Solid Food.  Clear liquids from MN until---730 am  ?Med rec completed ?Medications to take morning of surgery -----levothyroxine, estradiol ?Diabetic medication -----n/a ?Patient instructed no nail polish to be worn day of surgery ?Patient instructed to bring photo id and insurance card day of surgery ?Patient aware to have Driver (ride ) / caregiver phillip Vinje husband will stay    for 24 hours after surgery  ?Patient Special Instructions -----none ?Pre-Op special Istructions -----none ?Patient verbalized understanding of instructions that were given at this phone interview. ?Patient denies shortness of breath, chest pain, fever, cough at this phone interview.  ?

## 2021-12-08 NOTE — Anesthesia Preprocedure Evaluation (Addendum)
Anesthesia Evaluation  ?Patient identified by MRN, date of birth, ID band ?Patient awake ? ? ? ?Reviewed: ?Allergy & Precautions, NPO status , Patient's Chart, lab work & pertinent test results ? ?Airway ?Mallampati: II ? ?TM Distance: >3 FB ?Neck ROM: Full ? ? ? Dental ?no notable dental hx. ?(+) Teeth Intact, Dental Advisory Given ?  ?Pulmonary ?neg pulmonary ROS,  ?  ?Pulmonary exam normal ?breath sounds clear to auscultation ? ? ? ? ? ? Cardiovascular ?Normal cardiovascular exam ?Rhythm:Regular Rate:Normal ? ? ?  ?Neuro/Psych ?PSYCHIATRIC DISORDERS Anxiety Depression   ? GI/Hepatic ?GERD  ,  ?Endo/Other  ?Hypothyroidism  ? Renal/GU ?  ?Female GU complaint (Stress urinary incontinence) ? ? ?  ?Musculoskeletal ? ?(+) Arthritis ,  ? Abdominal ?  ?Peds ? Hematology ?  ?Anesthesia Other Findings ?NUU:[id number], topamax, entex LA , Florinal ? ?Pt S/P recent chemical peel on Face ? Reproductive/Obstetrics ? ?  ? ? ? ? ? ? ? ? ? ? ? ? ? ?  ?  ? ? ? ? ? ? ? ?Anesthesia Physical ?Anesthesia Plan ? ?ASA: 3 ? ?Anesthesia Plan: General  ? ?Post-op Pain Management: Minimal or no pain anticipated  ? ?Induction: Intravenous ? ?PONV Risk Score and Plan: 4 or greater and Treatment may vary due to age or medical condition and Ondansetron ? ?Airway Management Planned: LMA ? ?Additional Equipment: None ? ?Intra-op Plan:  ? ?Post-operative Plan:  ? ?Informed Consent: I have reviewed the patients History and Physical, chart, labs and discussed the procedure including the risks, benefits and alternatives for the proposed anesthesia with the patient or authorized representative who has indicated his/her understanding and acceptance.  ? ? ? ?Dental advisory given ? ?Plan Discussed with: Anesthesiologist and CRNA ? ?Anesthesia Plan Comments:   ? ? ? ? ? ?Anesthesia Quick Evaluation ? ?

## 2021-12-08 NOTE — H&P (Signed)
?[facility] ?Pre-Operative H&P ? ?Subjective ?Chief Complaint: Monique Thomas presents for a preoperative encounter.  ? ?History of Present Illness: ?Monique Thomas is a 71 y.o. female who presents for preoperative visit.  She is scheduled to undergo midurethral sling and cystoscopy on 12/09/21.  Her symptoms include urinary incontinence.  ?.  ?- Had TOT sling placed in 2004 and hysterectomy in 2008 ?- Had previously been on Myrbteriq and oxybutynin for OAB. Now on trospium.  ?- Urodynamic testing demonstrated both stress incontinence and detrusor instability ?- Had normal cystoscopy in 2022 ?- Underwent bulkamid urethral bulking in 05/2021- suboptimal placement due to pain.  ? ?Past Medical History:  ?Diagnosis Date  ? Anxiety   ? Arthritis   ? Back pain, chronic   ? Depression   ? GERD (gastroesophageal reflux disease)   ? History of kidney stones 2015  ? Hyperlipidemia   ? Hypothyroidism   ? Skin abnormality 12/07/2021  ? face red with 2 blisters intact on forehead due to [place]ak crean tx for skin cancer  ? Skin Cancer ([place])   ? SUI (stress urinary incontinence, female)   ? wears pads  ? Thyroid disease   ? TMJ (temporomandibular joint disorder)   ? joint has dislocated x 3 in past  ?  ? ?Past Surgical History:  ?Procedure Laterality Date  ? ABDOMINAL HYSTERECTOMY    ? age 38, 1 ovary reved at surgery 1 ovary removed later  ? ANTERIOR LAT LUMBAR FUSION N/A 04/07/2017  ? Procedure: LUMBAR ONE-TWO ANTERIOR LATERAL LUMBAR FUSION WITH LATERAL PLATE;  Surgeon: Eustace Moore, MD;  Location: [facility];  Service: Neurosurgery;  Laterality: N/A;  Right side approach  ? APPENDECTOMY    ? age 37  ? BLADDER SUSPENSION  2013  ? CESAREAN SECTION    ? x 2  ? EXTRACORPOREAL SHOCK WAVE LITHOTRIPSY  2015  ? HERNIA REPAIR    ? umbilical yrs ago  ? LAPAROSCOPIC LYSIS INTESTINAL ADHESIONS    ? yrs ago  ? MAXIMUM ACCESS (MAS)POSTERIOR LUMBAR INTERBODY FUSION (PLIF) 1 LEVEL N/A 04/16/2015  ? Procedure: masPLIF  - L2-L3 ;  Surgeon:  Eustace Moore, MD;  Location: [facility];  Service: Neurosurgery;  Laterality: N/A;  masPLIF  - L2-L3   ? SPINE SURGERY    ? lumbar before 2016 surgery  ? TONSILLECTOMY AND ADENOIDECTOMY    ? age 52  ? TOTAL KNEE ARTHROPLASTY Left 07/17/2018  ? Procedure: LEFT TOTAL KNEE ARTHROPLASTY;  Surgeon: Paralee Cancel, MD;  Location: [facility];  Service: Orthopedics;  Laterality: Left;  70 mins  ? ? ?is allergic to celebrex [celecoxib], other, topamax [topiramate], zofran [ondansetron], entex lq [phenylephrine-guaifenesin], and fiorinal [butalbital-aspirin-caffeine].  ? ?Family History  ?Problem Relation Age of Onset  ? Heart disease Mother   ? Hyperlipidemia Mother   ? Thyroid disease Mother   ? Asthma Sister   ? Cancer Maternal Grandmother   ?     ovarian  ? ? ?Social History  ? ?Tobacco Use  ? Smoking status: Never  ? Smokeless tobacco: Never  ?Vaping Use  ? Vaping Use: Never used  ?Substance Use Topics  ? Alcohol use: No  ? Drug use: No  ? ? ? ?Review of Systems was negative for a full 10 system review except as noted in the History of Present Illness. ? ?No current facility-administered medications for this encounter. ? ?Current Outpatient Medications:  ?  ALPRAZolam (XANAX) 1 MG tablet, TAKE 1/2 TO 1  TABLET BY MOUTH EVERY NIGHT AT BEDTIME. LIMIT TO 5 DAYS PER WEEK TO AVOID ADDICTION (Patient taking differently: at bedtime as needed. TAKE 1/2 TO 1 TABLET BY MOUTH EVERY NIGHT AT BEDTIME. LIMIT TO 5 DAYS PER WEEK TO AVOID ADDICTION), Disp: 30 tablet, Rfl: 0 ?  estradiol (ESTRACE) 1 MG tablet, Take 1 mg by mouth daily., Disp: , Rfl:  ?  Fluorouracil (TOLAK) 4 % CREA, APPLY TO FACE AT NIGHT FOR 28 DAYS (Patient taking differently: APPLY TO FACE AT NIGHT FOR 28 DAYS for  skin cancer area removal), Disp: 40 g, Rfl: 0 ?  levothyroxine (SYNTHROID) 88 MCG tablet, Take 1 tablet daily except 1/2 tab on Tues/Sat for thyroid on empty stomach with water only., Disp: 30 tablet, Rfl: 11 ?  ropinirole (REQUIP) 5 MG tablet, Take  1 tablet    at Bedtime  for Restless Legs                              /              TAKE 1 TABLET BY MOUTH, Disp: 90 tablet, Rfl: 3 ?  triamcinolone ointment (KENALOG) 0.1 %, Apply to face bid for 2 weeks (Patient taking differently: Apply to face bid for 2 weeks for red areas on face started 12-07-2021), Disp: 454 g, Rfl: 0  ? ?Objective ? ? ?Previous Pelvic Exam showed: ?s/p hysterectomy: Speculum exam reveals normal vaginal mucosa with  atrophy and normal vaginal cuff.  Adnexa no mass, fullness, tenderness.   ?  ? ?  ?POP-Q:  ?  ?POP-Q ?  ?-3  ?                                          Aa   ?-3 ?                                          Ba   ?-6  ?                                            C  ?  ?2.5  ?                                          Gh   ?4  ?                                          Pb   ?6  ?                                          tvl  ?  ?-2.5  ?  Ap   ?-2.5  ?                                          Bp   ?   ?                                            D  ?  ?  ?  ? ? ? ?Assessment/ Plan ? ?The patient is a 71 y.o. year old with stress urinary incontinence scheduled to undergo Exam under anesthesia, midurethral sling, cystoscopy.  ? ? ?Jaquita Folds, MD ? ? ? ?

## 2021-12-09 ENCOUNTER — Encounter (HOSPITAL_BASED_OUTPATIENT_CLINIC_OR_DEPARTMENT_OTHER)
Admission: RE | Disposition: A | Discharge status: Home / Self Care | Payer: Self-pay | Source: Home / Self Care | Attending: Obstetrics and Gynecology

## 2021-12-09 ENCOUNTER — Other Ambulatory Visit: Payer: Self-pay

## 2021-12-09 ENCOUNTER — Ambulatory Visit (HOSPITAL_BASED_OUTPATIENT_CLINIC_OR_DEPARTMENT_OTHER): Payer: Medicare PPO | Admitting: Anesthesiology

## 2021-12-09 ENCOUNTER — Encounter (HOSPITAL_BASED_OUTPATIENT_CLINIC_OR_DEPARTMENT_OTHER): Payer: Self-pay | Admitting: Obstetrics and Gynecology

## 2021-12-09 ENCOUNTER — Telehealth: Payer: Self-pay | Admitting: Obstetrics and Gynecology

## 2021-12-09 ENCOUNTER — Ambulatory Visit (HOSPITAL_BASED_OUTPATIENT_CLINIC_OR_DEPARTMENT_OTHER)
Admission: RE | Admit: 2021-12-09 | Discharge: 2021-12-09 | Disposition: A | Discharge status: Home / Self Care | Payer: Medicare PPO | Attending: Obstetrics and Gynecology | Admitting: Obstetrics and Gynecology

## 2021-12-09 DIAGNOSIS — F418 Other specified anxiety disorders: Secondary | ICD-10-CM | POA: Diagnosis not present

## 2021-12-09 DIAGNOSIS — E039 Hypothyroidism, unspecified: Secondary | ICD-10-CM | POA: Insufficient documentation

## 2021-12-09 DIAGNOSIS — Z79899 Other long term (current) drug therapy: Secondary | ICD-10-CM | POA: Insufficient documentation

## 2021-12-09 DIAGNOSIS — N393 Stress incontinence (female) (male): Secondary | ICD-10-CM | POA: Insufficient documentation

## 2021-12-09 DIAGNOSIS — M1990 Unspecified osteoarthritis, unspecified site: Secondary | ICD-10-CM | POA: Diagnosis not present

## 2021-12-09 DIAGNOSIS — F419 Anxiety disorder, unspecified: Secondary | ICD-10-CM | POA: Diagnosis not present

## 2021-12-09 HISTORY — DX: Unspecified temporomandibular joint disorder, unspecified side: M26.609

## 2021-12-09 HISTORY — PX: CYSTOSCOPY: SHX5120

## 2021-12-09 HISTORY — PX: BLADDER SUSPENSION: SHX72

## 2021-12-09 HISTORY — DX: Stress incontinence (female) (male): N39.3

## 2021-12-09 SURGERY — URETHROPEXY, USING TRANSVAGINAL TAPE
Anesthesia: General | Site: Vagina

## 2021-12-09 MED ORDER — DEXAMETHASONE SODIUM PHOSPHATE 10 MG/ML IJ SOLN
INTRAMUSCULAR | Status: AC
  Filled 2021-12-09: qty 1

## 2021-12-09 MED ORDER — IBUPROFEN 600 MG PO TABS: 600.0000 mg | ORAL_TABLET | Freq: Four times a day (QID) | ORAL | 0 refills | Status: DC | PRN

## 2021-12-09 MED ORDER — PROPOFOL 10 MG/ML IV BOLUS
INTRAVENOUS | Status: DC | PRN
  Administered 2021-12-09: 120 mg via INTRAVENOUS

## 2021-12-09 MED ORDER — LIDOCAINE HCL (PF) 2 % IJ SOLN
INTRAMUSCULAR | Status: AC
  Filled 2021-12-09: qty 5

## 2021-12-09 MED ORDER — MIDAZOLAM HCL 2 MG/2ML IJ SOLN
INTRAMUSCULAR | Status: AC
  Filled 2021-12-09: qty 2

## 2021-12-09 MED ORDER — FENTANYL CITRATE (PF) 100 MCG/2ML IJ SOLN: 25.0000 ug | INTRAMUSCULAR | Status: DC | PRN

## 2021-12-09 MED ORDER — FENTANYL CITRATE (PF) 100 MCG/2ML IJ SOLN
INTRAMUSCULAR | Status: DC | PRN
  Administered 2021-12-09 (×2): 25 ug via INTRAVENOUS
  Administered 2021-12-09: 50 ug via INTRAVENOUS

## 2021-12-09 MED ORDER — OXYCODONE HCL 5 MG PO TABS: 5.0000 mg | ORAL_TABLET | ORAL | 0 refills | Status: DC | PRN

## 2021-12-09 MED ORDER — CEFAZOLIN SODIUM-DEXTROSE 2-4 GM/100ML-% IV SOLN
2.0000 g | INTRAVENOUS | Status: AC
  Administered 2021-12-09: 2 g via INTRAVENOUS

## 2021-12-09 MED ORDER — PHENYLEPHRINE 40 MCG/ML (10ML) SYRINGE FOR IV PUSH (FOR BLOOD PRESSURE SUPPORT)
PREFILLED_SYRINGE | INTRAVENOUS | Status: AC
  Filled 2021-12-09: qty 10

## 2021-12-09 MED ORDER — ONDANSETRON HCL 4 MG/2ML IJ SOLN
INTRAMUSCULAR | Status: DC | PRN
  Administered 2021-12-09: 4 mg via INTRAVENOUS

## 2021-12-09 MED ORDER — PROPOFOL 10 MG/ML IV BOLUS
INTRAVENOUS | Status: AC
Start: 2021-12-09 — End: ?
  Filled 2021-12-09: qty 20

## 2021-12-09 MED ORDER — KETOROLAC TROMETHAMINE 30 MG/ML IJ SOLN
INTRAMUSCULAR | Status: DC | PRN
  Administered 2021-12-09: 15 mg via INTRAVENOUS

## 2021-12-09 MED ORDER — FENTANYL CITRATE (PF) 100 MCG/2ML IJ SOLN
INTRAMUSCULAR | Status: AC
  Filled 2021-12-09: qty 2

## 2021-12-09 MED ORDER — PHENYLEPHRINE HCL (PRESSORS) 10 MG/ML IV SOLN
INTRAVENOUS | Status: DC | PRN
  Administered 2021-12-09 (×3): 40 ug via INTRAVENOUS

## 2021-12-09 MED ORDER — ONDANSETRON HCL 4 MG/2ML IJ SOLN: 4.0000 mg | Freq: Once | INTRAMUSCULAR | Status: DC | PRN

## 2021-12-09 MED ORDER — ACETAMINOPHEN 10 MG/ML IV SOLN: 1000.0000 mg | Freq: Once | INTRAVENOUS | Status: DC | PRN

## 2021-12-09 MED ORDER — LACTATED RINGERS IV SOLN: INTRAVENOUS | Status: DC

## 2021-12-09 MED ORDER — MIDAZOLAM HCL 5 MG/5ML IJ SOLN
INTRAMUSCULAR | Status: DC | PRN
  Administered 2021-12-09: 1 mg via INTRAVENOUS

## 2021-12-09 MED ORDER — ACETAMINOPHEN 325 MG PO TABS: 650.0000 mg | ORAL_TABLET | Freq: Four times a day (QID) | ORAL | 2 refills | Status: DC | PRN

## 2021-12-09 MED ORDER — LIDOCAINE HCL (CARDIAC) PF 100 MG/5ML IV SOSY
PREFILLED_SYRINGE | INTRAVENOUS | Status: DC | PRN
  Administered 2021-12-09: 100 mg via INTRAVENOUS

## 2021-12-09 MED ORDER — LIDOCAINE-EPINEPHRINE 1 %-1:100000 IJ SOLN
INTRAMUSCULAR | Status: DC | PRN
  Administered 2021-12-09: 4 mL

## 2021-12-09 MED ORDER — WATER FOR IRRIGATION, STERILE IR SOLN
Status: DC | PRN
  Administered 2021-12-09: 3000 mL via INTRAVESICAL

## 2021-12-09 MED ORDER — ONDANSETRON HCL 4 MG/2ML IJ SOLN
INTRAMUSCULAR | Status: AC
  Filled 2021-12-09: qty 2

## 2021-12-09 MED ORDER — CEFAZOLIN SODIUM-DEXTROSE 2-4 GM/100ML-% IV SOLN
INTRAVENOUS | Status: AC
  Filled 2021-12-09: qty 100

## 2021-12-09 MED ORDER — DEXAMETHASONE SODIUM PHOSPHATE 4 MG/ML IJ SOLN
INTRAMUSCULAR | Status: DC | PRN
Start: 2021-12-09 — End: 2021-12-09
  Administered 2021-12-09: 5 mg via INTRAVENOUS

## 2021-12-09 MED ORDER — KETOROLAC TROMETHAMINE 30 MG/ML IJ SOLN
INTRAMUSCULAR | Status: AC
  Filled 2021-12-09: qty 1

## 2021-12-09 MED ORDER — 0.9 % SODIUM CHLORIDE (POUR BTL) OPTIME
TOPICAL | Status: DC | PRN
  Administered 2021-12-09: 500 mL

## 2021-12-09 SURGICAL SUPPLY — 42 items
ADH SKN CLS APL DERMABOND .7 (GAUZE/BANDAGES/DRESSINGS) ×2
AGENT HMST KT MTR STRL THRMB (HEMOSTASIS)
APL ESCP 34 STRL LF DISP (HEMOSTASIS)
APPLICATOR SURGIFLO ENDO (HEMOSTASIS) IMPLANT
BLADE CLIPPER SENSICLIP SURGIC (BLADE) ×3 IMPLANT
BLADE SURG 15 STRL LF DISP TIS (BLADE) ×2 IMPLANT
BLADE SURG 15 STRL SS (BLADE) ×3
DECANTER SPIKE VIAL GLASS SM (MISCELLANEOUS) ×3 IMPLANT
DERMABOND ADVANCED (GAUZE/BANDAGES/DRESSINGS) ×1
DERMABOND ADVANCED .7 DNX12 (GAUZE/BANDAGES/DRESSINGS) ×2 IMPLANT
DEVICE CAPIO SLIM SINGLE (INSTRUMENTS) IMPLANT
ELECT REM PT RETURN 9FT ADLT (ELECTROSURGICAL)
ELECTRODE REM PT RTRN 9FT ADLT (ELECTROSURGICAL) IMPLANT
GAUZE 4X4 16PLY ~~LOC~~+RFID DBL (SPONGE) ×3 IMPLANT
GLOVE SURG ENC MOIS LTX SZ6 (GLOVE) ×3 IMPLANT
GLOVE SURG UNDER POLY LF SZ6.5 (GLOVE) ×3 IMPLANT
GOWN STRL REUS W/TWL LRG LVL3 (GOWN DISPOSABLE) ×3 IMPLANT
HIBICLENS CHG 4% 4OZ BTL (MISCELLANEOUS) ×3 IMPLANT
HOLDER FOLEY CATH W/STRAP (MISCELLANEOUS) ×3 IMPLANT
KIT TURNOVER CYSTO (KITS) ×3 IMPLANT
MANIFOLD NEPTUNE II (INSTRUMENTS) ×3 IMPLANT
NEEDLE HYPO 22GX1.5 SAFETY (NEEDLE) ×3 IMPLANT
NS IRRIG 1000ML POUR BTL (IV SOLUTION) ×3 IMPLANT
PACK CYSTO (CUSTOM PROCEDURE TRAY) ×3 IMPLANT
PACK VAGINAL WOMENS (CUSTOM PROCEDURE TRAY) ×3 IMPLANT
RETRACTOR LONE STAR DISPOSABLE (INSTRUMENTS) ×3 IMPLANT
RETRACTOR STAY HOOK 5MM (MISCELLANEOUS) ×3 IMPLANT
SET IRRIG Y TYPE TUR BLADDER L (SET/KITS/TRAYS/PACK) ×3 IMPLANT
SUCTION FRAZIER HANDLE 10FR (MISCELLANEOUS) ×3
SUCTION TUBE FRAZIER 10FR DISP (MISCELLANEOUS) ×2 IMPLANT
SURGIFLO W/THROMBIN 8M KIT (HEMOSTASIS) IMPLANT
SUT ABS MONO DBL WITH NDL 48IN (SUTURE) IMPLANT
SUT MON AB 2-0 SH 27 (SUTURE) IMPLANT
SUT VIC AB 0 CT1 27 (SUTURE)
SUT VIC AB 0 CT1 27XBRD ANTBC (SUTURE) IMPLANT
SUT VIC AB 2-0 SH 27 (SUTURE) ×3
SUT VIC AB 2-0 SH 27XBRD (SUTURE) ×2 IMPLANT
SUT VICRYL 2-0 SH 8X27 (SUTURE) IMPLANT
SYR BULB EAR ULCER 3OZ GRN STR (SYRINGE) ×3 IMPLANT
SYS SLING ADV FIT BLUE TRNSVAG (Sling) ×1 IMPLANT
TOWEL OR 17X26 10 PK STRL BLUE (TOWEL DISPOSABLE) ×3 IMPLANT
TRAY FOLEY W/BAG SLVR 14FR (SET/KITS/TRAYS/PACK) ×3 IMPLANT

## 2021-12-09 NOTE — Anesthesia Procedure Notes (Signed)
Procedure Name: LMA Insertion ?Date/Time: 12/09/2021 10:35 AM ?Performed by: Bufford Spikes, CRNA ?Pre-anesthesia Checklist: Patient identified, Emergency Drugs available, Suction available and Patient being monitored ?Patient Re-evaluated:Patient Re-evaluated prior to induction ?Oxygen Delivery Method: Circle system utilized ?Preoxygenation: Pre-oxygenation with 100% oxygen ?Induction Type: IV induction ?Ventilation: Mask ventilation without difficulty ?LMA: LMA inserted ?LMA Size: 4.0 ?Number of attempts: 1 ?Placement Confirmation: positive ETCO2 ?Tube secured with: Tape ?Dental Injury: Teeth and Oropharynx as per pre-operative assessment  ? ? ? ? ?

## 2021-12-09 NOTE — Interval H&P Note (Signed)
History and Physical Interval Note: ? ?12/09/2021 ?9:49 AM ? ?Monique Thomas  has presented today for surgery, with the diagnosis of STRESS URINARY INCONTINENCE.  The various methods of treatment have been discussed with the patient and family. After consideration of risks, benefits and other options for treatment, the patient has consented to  Procedure(s): ?TRANSVAGINAL TAPE (TVT) PROCEDURE (N/A) ?CYSTOSCOPY (N/A) as a surgical intervention.  ?Vitals:  ? 12/09/21 0905  ?Pulse: 72  ?Resp: 20  ?Temp: 98.1 ?F (36.7 ?C)  ?SpO2: 98%  ? ? ?Gen: NAD ?Lungs: normal respirations ?Abd: soft, nontender ? ? The patient's history has been reviewed, patient examined, no change in status, stable for surgery.  I have reviewed the patient's chart and labs.  Questions were answered to the patient's satisfaction.   ? ? ?Jaquita Folds ? ? ?

## 2021-12-09 NOTE — Op Note (Signed)
Operative Note ? ?Preoperative Diagnosis: stress urinary incontinence ? ?Postoperative Diagnosis: same ? ?Procedures performed:  ?Midurethral sling (Advantage Fit), cystoscopy ? ?Implants:  ?Implant Name Type Inv. Item Serial No. Manufacturer Lot No. LRB No. Used Action  ?SYS SLING ADV FIT BLUE TRNSVAG - [id number] Sling SYS SLING ADV FIT BLUE TRNSVAG  [employer] [id number] N/A 1 Implanted  ? ? ?Attending Surgeon: Sherlene Shams, MD ? ?Anesthesia: General LMA ? ?Findings: On cystoscopy, normal bladder and urethra without injury, lesion or foreign body. Brisk bilateral ureteral efflux noted.   ? ?Specimens: none ? ?Estimated blood loss: 20 mL ? ?IV fluids: 600 mL ? ?Urine output: 100 mL ? ?Complications: none ? ?Procedure in Detail: ?After informed consent was obtained, the patient was taken to the operating room where she was placed under anesthesia.  She was then placed in the dorsal lithotomy position with allen stirrups,  and prepped and draped in the usual sterile fashion.  A strap was placed across her upper abdomen on top of her gown so it was not in direct contact with her skin.  Care was taken to avoid hyperflexion or hyperextension of her upper and lower extremities. A foley catheter was placed.  A lonestar self-retraining retractor was placed with 4 stay hooks. The mid urethral area was located on the anterior vaginal wall.  Two Allis clamps were placed at the level of the midurethra. 1% lidocaine with epinephrine was injected into the vaginal mucosa. A vertical incision was made between the two clamps using a 15-blade scalpel.  Using sharp dissection, Metzenbaum scissors were used to make a periurethral tunnel from the vaginal incision towards the pubic rami bilaterally for the future sling tracts. The bladder was ensured to be empty. The trocar and attached sling were introduced into the right side of the periurethral vaginal incision, just inferior to the pubic symphysis on the right side. The  trocar was guided through the endopelvic fascia and directly vertically.  While hugging the cephalad surface of the pubic bone, the trocar was guided out through the abdomen 2 fingerbreadths lateral to midline at the level of the pubic symphysis on the ipsilateral side. The trocar was placed on the left side in a similar fashion.  A 70-degree cystoscope was introduced, and 360-degree inspection revealed no trauma or trocars in the bladder, with brisk bilateral ureteral efflux.  The bladder was drained and the cystoscope was removed.  The Foley catheter was reinserted.  The sling was brought to lie beneath the mid-urethra.  A needle driver was placed behind the sling to ensure no tension.   The plastic sheath was removed from the sling and the distal ends of the sling were trimmed just below the level of the skin incisions.  Tension-free positioning of the sling was confirmed. Vaginal inspection revealed no vaginotomy or sling perforations of the mucosa.  The vaginal mucosal edges were reapproximated using 2-0 Vicryl.  The vagina was copiously irrigated.  Hemostasis was again noted. Vaginal packing not placed. The suprapubic sling incisions were closed with Dermabond. The patient tolerated the procedure well.  She was awakened from anesthesia and transferred to the recovery room in stable condition. All needle and sponge counts were correct x 2.   ? ?Jaquita Folds, MD ? ? ?

## 2021-12-09 NOTE — Discharge Instructions (Addendum)
POST OPERATIVE INSTRUCTIONS ? ?General Instructions ?Recovery (not bed rest) will last approximately 6 weeks ?Walking is encouraged, but refrain from strenuous exercise/ housework/ heavy lifting. ?No lifting >10lbs  ?Nothing in the vagina- NO intercourse, tampons or douching ?Bathing:  Do not submerge in water (NO swimming, bath, hot tub, etc) until after your postop visit. You can shower starting the day after surgery.  ?No driving until you are not taking narcotic pain medicine and until your pain is well enough controlled that you can slam on the breaks or make sudden movements if needed.  ? ?Taking your medications ?Please take your acetaminophen and ibuprofen on a schedule for the first 48 hours. Take 600mg ibuprofen, then take 500mg acetaminophen 3 hours later, then continue to alternate ibuprofen and acetaminophen. That way you are taking each type of medication every 6 hours. ?Take the prescribed narcotic (oxycodone, tramadol, etc) as needed, with a maximum being every 4 hours.  ?Take a stool softener daily to keep your stools soft and preventing you from straining. If you have diarrhea, you decrease your stool softener. This is explained more below. We have prescribed you Miralax. ? ?Reasons to Call the Nurse (see last page for phone numbers) ?Heavy Bleeding (changing your pad every 1-2 hours) ?Persistent nausea/vomiting ?Fever (100.4 degrees or more) ?Incision problems (pus or other fluid coming out, redness, warmth, increased pain) ? ?Things to Expect After Surgery ?Mild to Moderate pain is normal during the first day or two after surgery. If prescribed, take Ibuprofen or Tylenol first and use the stronger medicine for ?break-through? pain. You can overlap these medicines because they work differently.  ? ?Constipation  ? ?To Prevent Constipation:  Eat a well-balanced diet including protein, grains, fresh fruit and vegetables.  Drink plenty of fluids. Walk regularly.  Depending on specific instructions  from your physician: take Miralax daily and additionally you can add a stool softener (colace/ docusate) and fiber supplement. Continue as long as you're on pain medications.  ? ?To Treat Constipation:  If you do not have a bowel movement in 2 days after surgery, you can take 2 Tbs of Milk of Magnesia 1-2 times a day until you have a bowel movement. If diarrhea occurs, decrease the amount or stop the laxative. If no results with Milk of Magnesia, you can drink a bottle of magnesium citrate which you can purchase over the counter. ? ?Fatigue:  This is a normal response to surgery and will improve with time.  Plan frequent rest periods throughout the day. ? ?Gas Pain:  This is very common but can also be very painful! Drink warm liquids such as herbal teas, bouillon or soup. Walking will help you pass more gas.  Mylicon or Gas-X can be taken over the counter. ? ?Leaking Urine:  Varying amounts of leakage may occur after surgery.  This should improve with time. Your bladder needs at least 3 months to recover from surgery. If you leak after surgery, be sure to mention this to your doctor at your post-op visit. If you were taking medications for overactive bladder prior to surgery, be sure to restart the medications immediately after surgery. ? ?Incisions: If you have incisions on your abdomen, the skin glue will dissolve on its own over time. It is ok to gently rinse with soap and water over these incisions but do not scrub. ? ?Catheter ?Approximately 50% of patients are unable to urinate after surgery and need to go home with a catheter. This allows your bladder to   rest so it can return to full function. If you go home with a catheter, the office will call to set up a voiding trial a few days after surgery. For most patients, by this visit, they are able to urinate on their own. Long term catheter use is rare.  ? ?Return to Work ? ?As work demands and recovery times vary widely, it is hard to predict when you will want  to return to work. If you have a desk job with no strenuous physical activity, and if you would like to return sooner than generally recommended, discuss this with your provider or call our office.  ? ?Post op concerns ? ?For non-emergent issues, please call the Urogynecology Nurse. Please leave a message and someone will contact you within one business day.  You can also send a message through [place].  ? ?AFTER HOURS (After 5:00 PM and on weekends):  For urgent matters that cannot wait until the next business day. Call our office [phone] and connect to the doctor on call.  ?Please reserve this for important issues. ? ? ?**FOR ANY TRUE EMERGENCY ISSUES CALL 911 OR GO TO THE NEAREST EMERGENCY ROOM.** Please inform our office or the doctor on call of any emergency.   ? ? ?APPOINTMENTS: Call [phone] ? ? ? ?NO IBUPROFEN PRODUCTS (MOTRIN, ADVIL) OR ALEVE UNTIL 5:00PM TODAY. ? ? ? ?Post Anesthesia Home Care Instructions ? ?Activity: ?Get plenty of rest for the remainder of the day. A responsible individual must stay with you for 24 hours following the procedure.  ?For the next 24 hours, DO NOT: ?-Drive a car ?-Paediatric nurse ?-Drink alcoholic beverages ?-Take any medication unless instructed by your physician ?-Make any legal decisions or sign important papers. ? ?Meals: ?Start with liquid foods such as gelatin or soup. Progress to regular foods as tolerated. Avoid greasy, spicy, heavy foods. If nausea and/or vomiting occur, drink only clear liquids until the nausea and/or vomiting subsides. Call your physician if vomiting continues. ? ?Special Instructions/Symptoms: ?Your throat may feel dry or sore from the anesthesia or the breathing tube placed in your throat during surgery. If this causes discomfort, gargle with warm salt water. The discomfort should disappear within 24 hours. ? ?If you had a scopolamine patch placed behind your ear for the management of post- operative nausea and/or vomiting: ? ?1. The  medication in the patch is effective for 72 hours, after which it should be removed.  Wrap patch in a tissue and discard in the trash. Wash hands thoroughly with soap and water. ?2. You may remove the patch earlier than 72 hours if you experience unpleasant side effects which may include dry mouth, dizziness or visual disturbances. ?3. Avoid touching the patch. Wash your hands with soap and water after contact with the patch. ?    ? ? ?

## 2021-12-09 NOTE — Transfer of Care (Signed)
Immediate Anesthesia Transfer of Care Note ? ?Patient: Monique Thomas ? ?Procedure(s) Performed: TRANSVAGINAL TAPE (TVT) PROCEDURE (Vagina ) ?CYSTOSCOPY (Bladder) ? ?Patient Location: PACU ? ?Anesthesia Type:General ? ?Level of Consciousness: awake, alert  and oriented ? ?Airway & Oxygen Therapy: Patient Spontanous Breathing and Patient connected to nasal cannula oxygen ? ?Post-op Assessment: Report given to RN and Post -op Vital signs reviewed and stable ? ?Post vital signs: Reviewed and stable ? ?Last Vitals:  ?Vitals Value Taken Time  ?BP 116/58 12/09/21 1119  ?Temp    ?Pulse 71 12/09/21 1124  ?Resp 17 12/09/21 1124  ?SpO2 99 % 12/09/21 1124  ?Vitals shown include unvalidated device data. ? ?Last Pain:  ?Vitals:  ? 12/09/21 0905  ?TempSrc: Oral  ?PainSc: 0-No pain  ?   ? ?Patients Stated Pain Goal: 5 (12/09/21 0905) ? ?Complications: No notable events documented. ?

## 2021-12-09 NOTE — Telephone Encounter (Signed)
Monique Thomas underwent midurethral sling, cystoscopy on 12/09/21.  ? ?She passed her voiding trial.  ?217m was backfilled into the bladder ?Voided 2059m ?PVR by bladder scan was 3948m ? ?She was discharged without a catheter. Please call her for a routine post op check. Thanks! ? ?MicJaquita FoldsD ? ?

## 2021-12-09 NOTE — Anesthesia Postprocedure Evaluation (Signed)
Anesthesia Post Note ? ?Patient: Monique Thomas ? ?Procedure(s) Performed: TRANSVAGINAL TAPE (TVT) PROCEDURE (Vagina ) ?CYSTOSCOPY (Bladder) ? ?  ? ?Patient location during evaluation: PACU ?Anesthesia Type: General ?Level of consciousness: awake and alert ?Pain management: pain level controlled ?Vital Signs Assessment: post-procedure vital signs reviewed and stable ?Respiratory status: spontaneous breathing, nonlabored ventilation, respiratory function stable and patient connected to nasal cannula oxygen ?Cardiovascular status: blood pressure returned to baseline and stable ?Postop Assessment: no apparent nausea or vomiting ?Anesthetic complications: no ? ? ?No notable events documented. ? ?Last Vitals:  ?Vitals:  ? 12/09/21 1200 12/09/21 1302  ?BP:  (!) 119/50  ?Pulse:  64  ?Resp:  19  ?Temp: (!) 36.3 ?C 36.4 ?C  ?SpO2:  100%  ?  ?Last Pain:  ?Vitals:  ? 12/09/21 1302  ?TempSrc:   ?PainSc: 0-No pain  ? ? ?  ?  ?  ?  ?  ?  ? ?Barnet Glasgow ? ? ? ? ?

## 2021-12-10 ENCOUNTER — Encounter (HOSPITAL_BASED_OUTPATIENT_CLINIC_OR_DEPARTMENT_OTHER): Payer: Self-pay | Admitting: Obstetrics and Gynecology

## 2021-12-10 ENCOUNTER — Ambulatory Visit (INDEPENDENT_AMBULATORY_CARE_PROVIDER_SITE_OTHER): Payer: Medicare PPO | Admitting: Obstetrics and Gynecology

## 2021-12-10 ENCOUNTER — Other Ambulatory Visit: Payer: Self-pay

## 2021-12-10 VITALS — BP 135/61 | HR 86

## 2021-12-10 DIAGNOSIS — Z9889 Other specified postprocedural states: Secondary | ICD-10-CM

## 2021-12-10 NOTE — Progress Notes (Signed)
Patient presented due to increased pelvic pressure. She is s/p midurethral sling and cystoscopy yesterday. On arrival she voided and bladder scan was performed which showed PVR of 65m. ? ?We discussed that PVR is reassuring and recommended continued pain management. She will notify uKoreaif symptoms worsen or if she develops persistent dysuria. ? ?MJaquita Folds MD ? ?

## 2021-12-15 ENCOUNTER — Encounter: Payer: Self-pay | Admitting: *Deleted

## 2021-12-17 NOTE — Telephone Encounter (Signed)
Post- Op Call ? ?Monique Thomas underwent midurethral sling, cystoscopy on 12/09/21 with Dr Wannetta Sender. The patient reports that her pain is controlled. Pt said she has back pain that started yesterday. Pt said she did go out driving and the pain started after driving. She is taking nothing for pain. She denies vaginal bleeding. She has had a bowel movement and is taking nothing for a bowel regimen. She was discharged without a catheter.  ? ?Elita Quick, CMA  ?

## 2021-12-23 ENCOUNTER — Ambulatory Visit (INDEPENDENT_AMBULATORY_CARE_PROVIDER_SITE_OTHER): Payer: Medicare PPO

## 2021-12-23 ENCOUNTER — Other Ambulatory Visit (HOSPITAL_COMMUNITY)
Admission: RE | Admit: 2021-12-23 | Discharge: 2021-12-23 | Disposition: A | Discharge status: Home / Self Care | Payer: Medicare PPO | Source: Ambulatory Visit | Attending: Obstetrics and Gynecology | Admitting: Obstetrics and Gynecology

## 2021-12-23 ENCOUNTER — Telehealth: Payer: Self-pay | Admitting: Obstetrics and Gynecology

## 2021-12-23 DIAGNOSIS — N3000 Acute cystitis without hematuria: Secondary | ICD-10-CM

## 2021-12-23 DIAGNOSIS — R82998 Other abnormal findings in urine: Secondary | ICD-10-CM

## 2021-12-23 LAB — POCT URINALYSIS DIPSTICK
Appearance: ABNORMAL
Bilirubin, UA: NEGATIVE
Glucose, UA: NEGATIVE
Ketones, UA: NEGATIVE
Nitrite, UA: NEGATIVE
Protein, UA: NEGATIVE
Spec Grav, UA: 1.015 (ref 1.010–1.025)
Urobilinogen, UA: 0.2 E.U./dL
pH, UA: 7 (ref 5.0–8.0)

## 2021-12-23 MED ORDER — SULFAMETHOXAZOLE-TRIMETHOPRIM 800-160 MG PO TABS: 1.0000 | ORAL_TABLET | Freq: Two times a day (BID) | ORAL | 0 refills | Status: DC

## 2021-12-23 NOTE — Progress Notes (Signed)
Monique Thomas is a 71 y.o. female arrived today with UTI sx.  ?A urine specimen was collected and POCT Urine was done and urine culture sent to the lab. ?POCT Urine was Positive for Moderate Blood and Leukocytes   ?

## 2021-12-23 NOTE — Patient Instructions (Signed)
Your Urine dip that was done in office was Positive. I am sending the urine off for culture and you can take AZO over the counter for your discomfort.  We have also ordered Bactrim for you to take while we wait for your culture results, hopefully this gives you some relief. We will contact you when the results are back between 3-5 days. If a different antibiotic is needed we will sent the order to the pharmacy and you will be notified. If you have any questions or concerns please feel free to call us at [phone]   

## 2021-12-23 NOTE — Addendum Note (Signed)
Addended by: Elita Quick on: 12/23/2021 03:16 PM ? ? Modules accepted: Orders ? ?

## 2021-12-26 LAB — URINE CULTURE: Culture: >=100000 — AB

## 2021-12-29 MED ORDER — SULFAMETHOXAZOLE-TRIMETHOPRIM 800-160 MG PO TABS: 1.0000 | ORAL_TABLET | Freq: Two times a day (BID) | ORAL | 0 refills | Status: AC

## 2021-12-29 NOTE — Telephone Encounter (Signed)
Will extend treatment with bactrim for another week since she is still symptomatic.  ?

## 2021-12-30 NOTE — Telephone Encounter (Signed)
Pt was notified.  

## 2022-01-21 ENCOUNTER — Encounter: Payer: Self-pay | Admitting: Obstetrics and Gynecology

## 2022-01-21 ENCOUNTER — Ambulatory Visit (INDEPENDENT_AMBULATORY_CARE_PROVIDER_SITE_OTHER): Payer: Medicare PPO | Admitting: Obstetrics and Gynecology

## 2022-01-21 VITALS — BP 133/75 | HR 74

## 2022-01-21 DIAGNOSIS — Z9889 Other specified postprocedural states: Secondary | ICD-10-CM

## 2022-01-21 DIAGNOSIS — T83711A Erosion of implanted vaginal mesh and other prosthetic materials to surrounding organ or tissue, initial encounter: Secondary | ICD-10-CM

## 2022-01-21 MED ORDER — ESTRADIOL 0.1 MG/GM VA CREA: 0.5000 g | TOPICAL_CREAM | Freq: Every evening | VAGINAL | 11 refills | Status: DC

## 2022-01-21 NOTE — Progress Notes (Signed)
[facility] ? ?Date of Visit: 01/21/2022 ? ?History of Present Illness: Ms. Guion is a 71 y.o. female scheduled today for a post-operative visit.  ?? Surgery: s/p midurethral sling, cystoscopy on 12/09/21 ?? She passed her postoperative void trial.  ?? Postoperative course was complicated by UTI (>916,945 Proteus mirabilis) ? ?Today she reports she is doing well.  ? ?Pain? No  ?Stress incontinence: No - not leaking at all ?Urgency/frequency: Yes - only if she holds too long ?Urge incontinence: Yes  ?Voiding dysfunction: No  ?Bowel issues: No  ? ? ?Medications: She has a current medication list which includes the following prescription(s): alprazolam, estradiol, estradiol, levothyroxine, and ropinirole.  ? ?Allergies: Patient is allergic to celebrex [celecoxib], other, topamax [topiramate], zofran [ondansetron], entex lq [phenylephrine-guaifenesin], and fiorinal [butalbital-aspirin-caffeine].  ? ?Physical Exam: ?BP 133/75   Pulse 74   ?Suprapubic Incisions: healing well.  ?Pelvic Examination: Speculum exam shows no sutures present in the suburethra. Small < 0.5cm mesh exposure present in the midline.  No tenderness along the anterior vagina. No vaginal bleeding.  ? ?POP-Q: ?deferred ?--------------------------------------------------------- ? ?Assessment and Plan:  ?1. Erosion of implanted vaginal mesh to surrounding tissue, initial encounter ([place])   ?2. Post-operative state   ? ?- Mesh exposure noted at incision line. Discussed options of vaginal estrogen vs surgical correction. Prescribed estrace cream 0.5g to place under urethra nightly for a month.  ?- Can resume regular activity including exercise. Continue pelvic rest and refraining from submerging in water.  ? ?Follow up 1 month ? ?Jaquita Folds, MD ? ? ?

## 2022-01-26 ENCOUNTER — Other Ambulatory Visit: Payer: Self-pay | Admitting: Nurse Practitioner

## 2022-01-26 DIAGNOSIS — J069 Acute upper respiratory infection, unspecified: Secondary | ICD-10-CM

## 2022-01-31 NOTE — Progress Notes (Signed)
?AWV and follow up ? ?Assessment:  ? ? ?  ?Annual Medicare Wellness Visit ?Due annually  ?Health maintenance reviewed ? ? ?Aortic atherosclerosis ([place]) ?CT 09/25/19 ?Control blood pressure, lipids and glucose ?Disscused lifestyle modifications, diet & exercise ?Continue to monitor ? ?Labile hypertension ?Controlled with llifestlye ?Monitor blood pressure at home; call if consistently over 130/80 ?Continue DASH diet.   ?Reminder to go to the ER if any CP, SOB, nausea, dizziness, severe HA, changes vision/speech, left arm numbness and tingling and jaw pain. ? ?Hyperlipidemia, mixed ?Not currently on agent; discussed risks with atherosclerosis; she is receptive to starting rosuvastatin low dose low frequency for LDL goal <70 ?Monitor blood pressure at home; call if consistently over 130/80 ?Continue DASH diet.   ?Reminder to go to the ER if any CP, SOB, nausea, dizziness, severe HA, changes vision/speech, left arm numbness and tingling and jaw pain. ?- Lipid panel ?- CMP ?- CBC ? ?Gastroesophageal reflux disease with esophagitis without hemorrhage ?Well managed on current medications  ?Discussed diet, avoiding triggers and other lifestyle changes ? ?Vitamin D deficiency ?Continue supplementation  ?Check level annually and PRN dose change ? ?S/P lumbar fusion ?Chronic pain syndrome ?She is no longer following ortho/neurosurgery ?Declined pain med referral  ?Continue topical/oral NSAIDs, poorly tolerated cymbalta ?Patient preference to retry gabapentin - sent in  ? ?Abnormal glucose ?Discussed dietary and exercise modifications ?- check A1C annually ? ?Hypothyroidism ?continue medications the same pending lab results ?reminded to take on an empty stomach 30-29mns before food.  ?check TSH level ? ?Anxiety ?Well managed by current regimen; continue medications  ?Stress management techniques discussed, increase water, good sleep hygiene discussed, increase exercise, and increase veggies.  ? ?Osteopenia of left hip ?- get  dexa with next mammogram - order faxed to [facility] to schedule ? continue Vit D and high Ca diet, weight bearing exercises encouraged at least every other day  ? ?Restless legs ?Continue walking ?Unable to take Gabapentin due to drowsiness the next day ?Try 1/2 Xanax to determine if this does help as she states it helped better in the past and is unable to get good rest currently ? ?B12 deficiency ?Taking 1,0043m daily recommended to reduce by half ?Defer level today  ? ?BMI 25.0-25.9 ?Discussed dietary and exercise modifications ? ?Medication management ?Continued ? ?Stress Incontinence ?Continue vaginal premarin and follow with urogyn ? ? ? ? ? ? ? ?Further disposition pending results if labs check today. Discussed med's effects and SE's.   ?Over 40 minutes of face to face interview, exam, counseling, chart review, and critical decision making was performed.  ? ? ?Future Appointments  ?Date Time Provider [place]?02/18/2022 11:40 AM ScJaquita FoldsMD [facility]?02/01/2023 10:00 AM Mcclain Shall, DaTownsend RogerNP [facility] None  ? ? ? ? ?Subjective: HPI  ?BrNataliyah Thomas is a [age].o. female who presents for AWV and follow up. She has Back pain, chronic; Hyperlipidemia; Hypothyroidism; GERD (gastroesophageal reflux disease); Anxiety; Vitamin D deficiency; Medication management; Other abnormal glucose; Chronic pain syndrome; Restless legs; Menopausal symptoms; Osteopenia; [place]; Diverticulosis; and TMJ tenderness, bilateral on their problem list. ? ?She got married in 2019, has a grown daughter, son was murdered. She does had 2 bio grandkids. She is retired from [employer]al [employer] ? ?[facility]/p [facility], on estrace 1 mg via GYN. Hx of bladder sling in 2004, has done pelvic PT.  She had bladder sling 12/15/21 with urogyn- the sling is not adhering to one side- taking vaginal premarin. ? ?She is having more issues  with reflux and is using Pepcid at night.  She has sulcrafate but was trying to swallow it whole and was unable.  ? ?She has seen ortho/neurosurgeon for chronic back pain (arthritis, reports had MRI neg for impingement) persistent R leg and knee following replacement in 2019, has seen ortho, had injections, done PT without benefit. NSAIDs do help but trying to limit use, uses topicals. She as trying cymbalta 20 mg but reports poorly tolerated, would like to retry low dose gabapentin. Tylenol not helpful. She has tried TENS unit, interested in [facility], can get with [facility], requests letter.  ? ?she has a diagnosis of anxiety and is currently on xanax 1 mg, reports symptoms are well controlled on current regimen. she currently takes 1/2 tab at night to help with sleep/restless legs, has been tapering down and reducing use. Requip is not working for restless legs.  Gabapentin made her too hung over the next day. Believes the 1/2 Xanax worked better.  ? ?BMI is Body mass index is 26.01 kg/m?., she has been working on diet and exercise, walks to get 10000 steps by tracker daily.  ?Wt Readings from Last 3 Encounters:  ?02/01/22 135 lb 6.4 oz (61.4 kg)  ?12/09/21 133 lb 13.9 oz (60.7 kg)  ?11/17/21 135 lb 9.6 oz (61.5 kg)  ? ?She does not have htn. Today their BP is BP: 112/60  ?BP Readings from Last 3 Encounters:  ?02/01/22 112/60  ?01/21/22 133/75  ?12/10/21 135/61  ?She does workout. She denies chest pain, shortness of breath, dizziness.  ? ?She has aortic atherosclerosis per CT 09/2019.  ? ?She is not on cholesterol medication and denies myalgias. Her cholesterol is at goal. The cholesterol last visit was:   ?Lab Results  ?Component Value Date  ? CHOL 196 06/30/2021  ? HDL 77 06/30/2021  ? [place] 89 06/30/2021  ? TRIG 201 (H) 06/30/2021  ? CHOLHDL 2.5 06/30/2021  ? ? She has been working on diet and exercise for glucose management, and denies foot ulcerations, increased appetite, nausea, paresthesia of the feet, polydipsia,  polyuria, visual disturbances, vomiting and weight loss. Last A1C in the office was:  ?Lab Results  ?Component Value Date  ? HGBA1C 5.3 06/30/2021  ? ?She is on thyroid medication. Her medication was changed last visit, reduced from 88 mcg daily to 1/2 tab twice a week and whole tab other days.  ?Lab Results  ?Component Value Date  ? TSH 1.77 09/28/2021  ? ?Last GFR: ?Lab Results  ?Component Value Date  ? GFRNONAA 91 01/28/2021  ? ?Patient is on Vitamin D supplement  ?Lab Results  ?Component Value Date  ? VD25OH 78 01/28/2021  ?   ?  ? ? ?Medication Review: ?Current Outpatient Medications on File Prior to Visit  ?Medication Sig Dispense Refill  ? ALPRAZolam (XANAX) 1 MG tablet TAKE 1/2 TO 1 TABLET BY MOUTH EVERY NIGHT AT BEDTIME. LIMIT TO 5 DAYS PER WEEK TO AVOID ADDICTION (Patient taking differently: at bedtime as needed. TAKE 1/2 TO 1 TABLET BY MOUTH EVERY NIGHT AT BEDTIME. LIMIT TO 5 DAYS PER WEEK TO AVOID ADDICTION) 30 tablet 0  ? Cholecalciferol (VITAMIN D) 125 MCG (5000 UT) CAPS Take by mouth.    ? estradiol (ESTRACE) 0.1 MG/GM vaginal cream Place 0.5 g vaginally at bedtime. 30 g 11  ? levothyroxine (SYNTHROID) 88 MCG tablet  Take 1 tablet daily except 1/2 tab on Tues/Sat for thyroid on empty stomach with water only. 30 tablet 11  ? Magnesium 500 MG CAPS Take by mouth.    ? ropinirole (REQUIP) 5 MG tablet Take  1 tablet   at Bedtime  for Restless Legs                              /              TAKE 1 TABLET BY MOUTH 90 tablet 3  ? estradiol (ESTRACE) 1 MG tablet Take 1 mg by mouth daily. (Patient not taking: Reported on 02/01/2022)    ? ?No current facility-administered medications on file prior to visit.  ? ? ?Allergies  ?Allergen Reactions  ? Celebrex [Celecoxib] Other (See Comments)  ?  "panic attack"  ? Other Other (See Comments)  ?  Skin bruises very easily and peels back  ? Topamax [Topiramate] Other (See Comments)  ?  MEMORY  ? Zofran [Ondansetron] Other (See Comments)  ?  Headache  ? Entex Lq  [Phenylephrine-Guaifenesin] Rash  ? Fiorinal [Butalbital-Aspirin-Caffeine] Rash  ? ? ?Current Problems (verified) ?Patient Active Problem List  ? Diagnosis Date Noted  ? TMJ tenderness, bilateral 09/28/2021  ?

## 2022-02-01 ENCOUNTER — Ambulatory Visit: Payer: Medicare PPO | Admitting: Nurse Practitioner

## 2022-02-01 ENCOUNTER — Encounter: Payer: Self-pay | Admitting: Nurse Practitioner

## 2022-02-01 VITALS — BP 112/60 | HR 72 | Temp 97.9°F | Wt 135.4 lb

## 2022-02-01 DIAGNOSIS — Z981 Arthrodesis status: Secondary | ICD-10-CM

## 2022-02-01 DIAGNOSIS — N393 Stress incontinence (female) (male): Secondary | ICD-10-CM

## 2022-02-01 DIAGNOSIS — Z0001 Encounter for general adult medical examination with abnormal findings: Secondary | ICD-10-CM

## 2022-02-01 DIAGNOSIS — E559 Vitamin D deficiency, unspecified: Secondary | ICD-10-CM

## 2022-02-01 DIAGNOSIS — I700 Atherosclerosis of aorta: Secondary | ICD-10-CM

## 2022-02-01 DIAGNOSIS — R0989 Other specified symptoms and signs involving the circulatory and respiratory systems: Secondary | ICD-10-CM | POA: Diagnosis not present

## 2022-02-01 DIAGNOSIS — K2100 Gastro-esophageal reflux disease with esophagitis, without bleeding: Secondary | ICD-10-CM | POA: Diagnosis not present

## 2022-02-01 DIAGNOSIS — E039 Hypothyroidism, unspecified: Secondary | ICD-10-CM | POA: Diagnosis not present

## 2022-02-01 DIAGNOSIS — M85852 Other specified disorders of bone density and structure, left thigh: Secondary | ICD-10-CM

## 2022-02-01 DIAGNOSIS — E782 Mixed hyperlipidemia: Secondary | ICD-10-CM

## 2022-02-01 DIAGNOSIS — E538 Deficiency of other specified B group vitamins: Secondary | ICD-10-CM

## 2022-02-01 DIAGNOSIS — G2581 Restless legs syndrome: Secondary | ICD-10-CM

## 2022-02-01 DIAGNOSIS — R6889 Other general symptoms and signs: Secondary | ICD-10-CM

## 2022-02-01 DIAGNOSIS — Z0000 Encounter for general adult medical examination without abnormal findings: Secondary | ICD-10-CM

## 2022-02-01 DIAGNOSIS — Z79899 Other long term (current) drug therapy: Secondary | ICD-10-CM

## 2022-02-01 DIAGNOSIS — G894 Chronic pain syndrome: Secondary | ICD-10-CM

## 2022-02-01 DIAGNOSIS — E663 Overweight: Secondary | ICD-10-CM

## 2022-02-01 DIAGNOSIS — F419 Anxiety disorder, unspecified: Secondary | ICD-10-CM

## 2022-02-02 ENCOUNTER — Encounter: Payer: Self-pay | Admitting: Nurse Practitioner

## 2022-02-02 LAB — COMPLETE METABOLIC PANEL WITH GFR
AG Ratio: 1.7 (calc) (ref 1.0–2.5)
ALT: 14 U/L (ref 6–29)
AST: 20 U/L (ref 10–35)
Albumin: 4.4 g/dL (ref 3.6–5.1)
Alkaline phosphatase (APISO): 55 U/L (ref 37–153)
BUN: 17 mg/dL (ref 7–25)
CO2: 25 mmol/L (ref 20–32)
Calcium: 9.4 mg/dL (ref 8.6–10.4)
Chloride: 106 mmol/L (ref 98–110)
Creat: 0.71 mg/dL (ref 0.60–1.00)
Globulin: 2.6 g/dL (calc) (ref 1.9–3.7)
Glucose, Bld: 97 mg/dL (ref 65–99)
Potassium: 4.4 mmol/L (ref 3.5–5.3)
Sodium: 140 mmol/L (ref 135–146)
Total Bilirubin: 0.5 mg/dL (ref 0.2–1.2)
Total Protein: 7 g/dL (ref 6.1–8.1)
eGFR: 91 mL/min/{1.73_m2} (ref >=60)

## 2022-02-02 LAB — LIPID PANEL
Cholesterol: 197 mg/dL (ref <200)
HDL: 75 mg/dL (ref >=50)
LDL Cholesterol (Calc): 98 mg/dL (calc)
Non-HDL Cholesterol (Calc): 122 mg/dL (calc) (ref <130)
Total CHOL/HDL Ratio: 2.6 (calc) (ref <5.0)
Triglycerides: 147 mg/dL (ref <150)

## 2022-02-02 LAB — CBC WITH DIFFERENTIAL/PLATELET
Absolute Monocytes: 299 cells/uL (ref 200–950)
Basophils Absolute: 40 cells/uL (ref 0–200)
Basophils Relative: 0.9 %
Eosinophils Absolute: 132 cells/uL (ref 15–500)
Eosinophils Relative: 3 %
HCT: 36.7 % (ref 35.0–45.0)
Hemoglobin: 12.3 g/dL (ref 11.7–15.5)
Lymphs Abs: 1703 cells/uL (ref 850–3900)
MCH: 30 pg (ref 27.0–33.0)
MCHC: 33.5 g/dL (ref 32.0–36.0)
MCV: 89.5 fL (ref 80.0–100.0)
MPV: 8.7 fL (ref 7.5–12.5)
Monocytes Relative: 6.8 %
Neutro Abs: 2226 cells/uL (ref 1500–7800)
Neutrophils Relative %: 50.6 %
Platelets: 330 10*3/uL (ref 140–400)
RBC: 4.1 10*6/uL (ref 3.80–5.10)
RDW: 13 % (ref 11.0–15.0)
Total Lymphocyte: 38.7 %
WBC: 4.4 10*3/uL (ref 3.8–10.8)

## 2022-02-02 LAB — TSH: TSH: 2.41 mIU/L (ref 0.40–4.50)

## 2022-02-03 ENCOUNTER — Other Ambulatory Visit: Payer: Self-pay | Admitting: Nurse Practitioner

## 2022-02-03 DIAGNOSIS — F419 Anxiety disorder, unspecified: Secondary | ICD-10-CM

## 2022-02-03 MED ORDER — ALPRAZOLAM 1 MG PO TABS: ORAL_TABLET | ORAL | 0 refills | Status: DC

## 2022-02-18 ENCOUNTER — Encounter: Payer: Self-pay | Admitting: Obstetrics and Gynecology

## 2022-02-18 ENCOUNTER — Ambulatory Visit: Payer: Medicare PPO | Admitting: Obstetrics and Gynecology

## 2022-02-18 VITALS — BP 121/75 | HR 68

## 2022-02-18 DIAGNOSIS — T83711S Erosion of implanted vaginal mesh and other prosthetic materials to surrounding organ or tissue, sequela: Secondary | ICD-10-CM

## 2022-02-18 DIAGNOSIS — T83711A Erosion of implanted vaginal mesh and other prosthetic materials to surrounding organ or tissue, initial encounter: Secondary | ICD-10-CM

## 2022-02-18 NOTE — Progress Notes (Signed)
[facility] Return Visit  SUBJECTIVE  History of Present Illness: Monique Thomas is a 71 y.o. female seen in follow-up vaginal mesh exposure from sling.  She is s/p midurethral sling, cystoscopy on 12/09/21 and has a history of prior TOT sling.   She has been using the vaginal estrogen cream nightly for the last month.   Past Medical History: Patient  has a past medical history of Anxiety, Arthritis, Back pain, chronic, Depression, GERD (gastroesophageal reflux disease), History of kidney stones (2015), Hyperlipidemia, Hypothyroidism, Skin abnormality (12/07/2021), Skin Cancer ([place]), SUI (stress urinary incontinence, female), Thyroid disease, and TMJ (temporomandibular joint disorder).   Past Surgical History: She  has a past surgical history that includes Abdominal hysterectomy; Cesarean section; Hernia repair; Appendectomy; Tonsillectomy and adenoidectomy; Spine surgery; Laparoscopic lysis intestinal adhesions; Maximum access (mas)posterior lumbar interbody fusion (plif) 1 level (N/A, 04/16/2015); Anterior lat lumbar fusion (N/A, 04/07/2017); Total knee arthroplasty (Left, 07/17/2018); Extracorporeal shock wave lithotripsy (2015); Bladder suspension (2013); Bladder suspension (N/A, 12/09/2021); and Cystoscopy (N/A, 12/09/2021).   Medications: She has a current medication list which includes the following prescription(s): alprazolam, vitamin d, estradiol, estradiol, levothyroxine, magnesium, and ropinirole.   Allergies: Patient is allergic to celebrex [celecoxib], other, topamax [topiramate], zofran [ondansetron], entex lq [phenylephrine-guaifenesin], and fiorinal [butalbital-aspirin-caffeine].   Social History: Patient  reports that she has never smoked. She has never used smokeless tobacco. She reports that she does not drink alcohol and does not use drugs.      OBJECTIVE     Physical Exam: Vitals:   02/18/22 1146  BP: 121/75  Pulse: 68   Gen: No apparent distress, A&O  x 3.  Detailed Urogynecologic Evaluation:  Speculum exam shows persistent small < 0.5cm mesh exposure present in the midline.  No tenderness along the anterior vagina. No vaginal bleeding.    ASSESSMENT AND PLAN    [name] is a 71 y.o. with:  1. Erosion of implanted vaginal mesh and prosthetic materials, sequela    - Minimal improvement noted with mesh exposure. We discussed option of continued estrogen use vs excision in the OR and she would like to proceed with excision.  - We reviewed that excision of the mesh can cause incontinence to return but this is less likely with small amounts removed and would only remove what is necessary.  - Will send request for surgery scheduling.   Jaquita Folds, MD

## 2022-03-01 ENCOUNTER — Encounter: Payer: Self-pay | Admitting: Obstetrics and Gynecology

## 2022-03-02 DIAGNOSIS — Z01419 Encounter for gynecological examination (general) (routine) without abnormal findings: Secondary | ICD-10-CM | POA: Diagnosis not present

## 2022-03-09 ENCOUNTER — Encounter (HOSPITAL_BASED_OUTPATIENT_CLINIC_OR_DEPARTMENT_OTHER): Payer: Self-pay | Admitting: Obstetrics and Gynecology

## 2022-03-09 NOTE — Progress Notes (Signed)
Spoke w/ via phone for pre-op interview--- [place]---- NONE              Lab results------ COVID test -----patient states asymptomatic no test needed Arrive at -------2426 NPO after MN NO Solid Food.  Clear liquids from MN until---1145 Med rec completed Medications to take morning of surgery ----- Levothyroxine and Pepcid Diabetic medication ----- Patient instructed no nail polish to be worn day of surgery Patient instructed to bring photo id and insurance card day of surgery Patient aware to have Driver (ride ) / caregiver Husband Monique Thomas   for 24 hours after surgery  Patient Special Instructions ----- Pre-Op special Istructions ----- Patient verbalized understanding of instructions that were given at this phone interview. Patient denies shortness of breath, chest pain, fever, cough at this phone interview.

## 2022-03-14 ENCOUNTER — Encounter (HOSPITAL_BASED_OUTPATIENT_CLINIC_OR_DEPARTMENT_OTHER)
Admission: RE | Disposition: A | Discharge status: Home / Self Care | Payer: Self-pay | Source: Ambulatory Visit | Attending: Obstetrics and Gynecology

## 2022-03-14 ENCOUNTER — Ambulatory Visit (HOSPITAL_BASED_OUTPATIENT_CLINIC_OR_DEPARTMENT_OTHER)
Admission: RE | Admit: 2022-03-14 | Discharge: 2022-03-14 | Disposition: A | Discharge status: Home / Self Care | Payer: Medicare PPO | Source: Ambulatory Visit | Attending: Obstetrics and Gynecology | Admitting: Obstetrics and Gynecology

## 2022-03-14 ENCOUNTER — Encounter (HOSPITAL_BASED_OUTPATIENT_CLINIC_OR_DEPARTMENT_OTHER): Payer: Self-pay | Admitting: Obstetrics and Gynecology

## 2022-03-14 ENCOUNTER — Ambulatory Visit (HOSPITAL_BASED_OUTPATIENT_CLINIC_OR_DEPARTMENT_OTHER): Payer: Medicare PPO | Admitting: Anesthesiology

## 2022-03-14 DIAGNOSIS — T83711A Erosion of implanted vaginal mesh and other prosthetic materials to surrounding organ or tissue, initial encounter: Secondary | ICD-10-CM

## 2022-03-14 DIAGNOSIS — E039 Hypothyroidism, unspecified: Secondary | ICD-10-CM

## 2022-03-14 DIAGNOSIS — N393 Stress incontinence (female) (male): Secondary | ICD-10-CM | POA: Diagnosis not present

## 2022-03-14 DIAGNOSIS — Y832 Surgical operation with anastomosis, bypass or graft as the cause of abnormal reaction of the patient, or of later complication, without mention of misadventure at the time of the procedure: Secondary | ICD-10-CM | POA: Diagnosis not present

## 2022-03-14 DIAGNOSIS — M1990 Unspecified osteoarthritis, unspecified site: Secondary | ICD-10-CM

## 2022-03-14 DIAGNOSIS — F418 Other specified anxiety disorders: Secondary | ICD-10-CM

## 2022-03-14 DIAGNOSIS — T83721A Exposure of implanted vaginal mesh and other prosthetic materials into vagina, initial encounter: Secondary | ICD-10-CM | POA: Insufficient documentation

## 2022-03-14 HISTORY — PX: EXCISION OF MESH: SHX6268

## 2022-03-14 SURGERY — REMOVAL, MESH, ABDOMEN OR PELVIS
Anesthesia: General

## 2022-03-14 MED ORDER — DEXAMETHASONE SODIUM PHOSPHATE 4 MG/ML IJ SOLN
INTRAMUSCULAR | Status: DC | PRN
  Administered 2022-03-14: 10 mg via INTRAVENOUS

## 2022-03-14 MED ORDER — KETOROLAC TROMETHAMINE 30 MG/ML IJ SOLN
INTRAMUSCULAR | Status: AC
  Filled 2022-03-14: qty 1

## 2022-03-14 MED ORDER — SODIUM CHLORIDE 0.9 % IR SOLN
Status: DC | PRN
  Administered 2022-03-14: 1000 mL via INTRAVESICAL

## 2022-03-14 MED ORDER — OXYCODONE HCL 5 MG PO TABS: 5.0000 mg | ORAL_TABLET | Freq: Once | ORAL | Status: DC | PRN

## 2022-03-14 MED ORDER — FENTANYL CITRATE (PF) 100 MCG/2ML IJ SOLN: 25.0000 ug | INTRAMUSCULAR | Status: DC | PRN

## 2022-03-14 MED ORDER — LACTATED RINGERS IV SOLN: INTRAVENOUS | Status: DC

## 2022-03-14 MED ORDER — AMISULPRIDE (ANTIEMETIC) 5 MG/2ML IV SOLN: 5.0000 mg | Freq: Once | INTRAVENOUS | Status: DC | PRN

## 2022-03-14 MED ORDER — PHENAZOPYRIDINE HCL 100 MG PO TABS
ORAL_TABLET | ORAL | Status: AC
  Filled 2022-03-14: qty 2

## 2022-03-14 MED ORDER — KETOROLAC TROMETHAMINE 30 MG/ML IJ SOLN
INTRAMUSCULAR | Status: DC | PRN
  Administered 2022-03-14: 30 mg via INTRAVENOUS

## 2022-03-14 MED ORDER — LIDOCAINE HCL (PF) 2 % IJ SOLN
INTRAMUSCULAR | Status: AC
  Filled 2022-03-14: qty 5

## 2022-03-14 MED ORDER — PHENAZOPYRIDINE HCL 100 MG PO TABS
200.0000 mg | ORAL_TABLET | ORAL | Status: AC
  Administered 2022-03-14: 200 mg via ORAL

## 2022-03-14 MED ORDER — DEXAMETHASONE SODIUM PHOSPHATE 10 MG/ML IJ SOLN
INTRAMUSCULAR | Status: AC
  Filled 2022-03-14: qty 1

## 2022-03-14 MED ORDER — FENTANYL CITRATE (PF) 100 MCG/2ML IJ SOLN
INTRAMUSCULAR | Status: AC
  Filled 2022-03-14: qty 2

## 2022-03-14 MED ORDER — MIDAZOLAM HCL 5 MG/5ML IJ SOLN
INTRAMUSCULAR | Status: DC | PRN
  Administered 2022-03-14: 2 mg via INTRAVENOUS

## 2022-03-14 MED ORDER — PROPOFOL 10 MG/ML IV BOLUS
INTRAVENOUS | Status: AC
  Filled 2022-03-14: qty 20

## 2022-03-14 MED ORDER — LIDOCAINE-EPINEPHRINE 1 %-1:100000 IJ SOLN
INTRAMUSCULAR | Status: DC | PRN
  Administered 2022-03-14: 2 mL

## 2022-03-14 MED ORDER — OXYCODONE HCL 5 MG/5ML PO SOLN: 5.0000 mg | Freq: Once | ORAL | Status: DC | PRN

## 2022-03-14 MED ORDER — FENTANYL CITRATE (PF) 100 MCG/2ML IJ SOLN
INTRAMUSCULAR | Status: DC | PRN
  Administered 2022-03-14 (×2): 25 ug via INTRAVENOUS
  Administered 2022-03-14: 50 ug via INTRAVENOUS

## 2022-03-14 MED ORDER — CEFAZOLIN SODIUM-DEXTROSE 2-4 GM/100ML-% IV SOLN
INTRAVENOUS | Status: AC
  Filled 2022-03-14: qty 100

## 2022-03-14 MED ORDER — MIDAZOLAM HCL 2 MG/2ML IJ SOLN
INTRAMUSCULAR | Status: AC
  Filled 2022-03-14: qty 2

## 2022-03-14 MED ORDER — CEFAZOLIN SODIUM-DEXTROSE 2-4 GM/100ML-% IV SOLN
2.0000 g | INTRAVENOUS | Status: AC
  Administered 2022-03-14: 2 g via INTRAVENOUS

## 2022-03-14 MED ORDER — PROPOFOL 10 MG/ML IV BOLUS
INTRAVENOUS | Status: DC | PRN
  Administered 2022-03-14: 160 mg via INTRAVENOUS

## 2022-03-14 MED ORDER — LIDOCAINE HCL (CARDIAC) PF 100 MG/5ML IV SOSY
PREFILLED_SYRINGE | INTRAVENOUS | Status: DC | PRN
  Administered 2022-03-14: 100 mg via INTRAVENOUS

## 2022-03-14 SURGICAL SUPPLY — 41 items
ADH SKN CLS APL DERMABOND .7 (GAUZE/BANDAGES/DRESSINGS)
AGENT HMST KT MTR STRL THRMB (HEMOSTASIS)
APL ESCP 34 STRL LF DISP (HEMOSTASIS)
APPLICATOR SURGIFLO ENDO (HEMOSTASIS) IMPLANT
BLADE CLIPPER SENSICLIP SURGIC (BLADE) ×1 IMPLANT
BLADE SURG 15 STRL LF DISP TIS (BLADE) ×1 IMPLANT
BLADE SURG 15 STRL SS (BLADE) ×2
DECANTER SPIKE VIAL GLASS SM (MISCELLANEOUS) ×2 IMPLANT
DERMABOND ADVANCED (GAUZE/BANDAGES/DRESSINGS)
DERMABOND ADVANCED .7 DNX12 (GAUZE/BANDAGES/DRESSINGS) ×1 IMPLANT
DEVICE CAPIO SLIM SINGLE (INSTRUMENTS) IMPLANT
GAUZE 4X4 16PLY ~~LOC~~+RFID DBL (SPONGE) ×2 IMPLANT
GLOVE BIO SURGEON STRL SZ 6 (GLOVE) ×2 IMPLANT
GLOVE BIOGEL PI IND STRL 6.5 (GLOVE) ×1 IMPLANT
GLOVE BIOGEL PI IND STRL 7.0 (GLOVE) ×1 IMPLANT
GLOVE BIOGEL PI INDICATOR 6.5 (GLOVE) ×1
GLOVE BIOGEL PI INDICATOR 7.0 (GLOVE) ×1
GLOVE ECLIPSE 6.0 STRL STRAW (GLOVE) ×2 IMPLANT
GOWN STRL REUS W/TWL LRG LVL3 (GOWN DISPOSABLE) ×2 IMPLANT
HIBICLENS CHG 4% 4OZ BTL (MISCELLANEOUS) ×2 IMPLANT
HOLDER FOLEY CATH W/STRAP (MISCELLANEOUS) ×1 IMPLANT
KIT TURNOVER CYSTO (KITS) ×2 IMPLANT
NEEDLE HYPO 22GX1.5 SAFETY (NEEDLE) ×2 IMPLANT
NS IRRIG 1000ML POUR BTL (IV SOLUTION) ×2 IMPLANT
PACK VAGINAL WOMENS (CUSTOM PROCEDURE TRAY) ×2 IMPLANT
RETRACTOR LONE STAR DISPOSABLE (INSTRUMENTS) ×2 IMPLANT
RETRACTOR STAY HOOK 5MM (MISCELLANEOUS) ×2 IMPLANT
SET IRRIG Y TYPE TUR BLADDER L (SET/KITS/TRAYS/PACK) ×2 IMPLANT
SUCTION FRAZIER HANDLE 10FR (MISCELLANEOUS) ×2
SUCTION TUBE FRAZIER 10FR DISP (MISCELLANEOUS) ×1 IMPLANT
SURGIFLO W/THROMBIN 8M KIT (HEMOSTASIS) IMPLANT
SUT ABS MONO DBL WITH NDL 48IN (SUTURE) IMPLANT
SUT MON AB 2-0 SH 27 (SUTURE) IMPLANT
SUT VIC AB 0 CT1 27 (SUTURE)
SUT VIC AB 0 CT1 27XBRD ANTBC (SUTURE) IMPLANT
SUT VIC AB 2-0 SH 27 (SUTURE) ×2
SUT VIC AB 2-0 SH 27XBRD (SUTURE) ×1 IMPLANT
SUT VICRYL 2-0 SH 8X27 (SUTURE) ×1 IMPLANT
SYR BULB EAR ULCER 3OZ GRN STR (SYRINGE) ×2 IMPLANT
TOWEL OR 17X26 10 PK STRL BLUE (TOWEL DISPOSABLE) ×2 IMPLANT
TRAY FOLEY W/BAG SLVR 14FR (SET/KITS/TRAYS/PACK) ×2 IMPLANT

## 2022-03-14 NOTE — Op Note (Addendum)
Operative Note  Preoperative Diagnosis:  vaginal mesh exposure  Postoperative Diagnosis: same  Procedures performed:  Sling revision, cystoscopy  Implants: none  Attending Surgeon: Sherlene Shams, MD  Anesthesia: General LMA  Findings: 0.5 midline suburethral area of exposed vaginal mesh from previously placed Advantage Fit TVT sling.    Specimens: none  Estimated blood loss: 5 mL  IV fluids: 900 mL  Urine output: 10 mL  Complications: none  Procedure in Detail:  After informed consent was obtained the patient was taken to the operating room where general anesthesia was induced and found to be adequate.  She is placed in dorsal lithotomy position taking care to avoid any nerve injury and prepped and draped in the usual sterile fashion.  Under anesthesia revealed a half a centimeter area in the midline suburethra with exposed mesh from prior sling placement.  A cystourethroscopy was performed with a 70 degree cystoscope.  A 360 degree view of the bladder was obtained revealing no lesions or foreign body in the bladder.  Brisk bilateral ureteral E flux was present.  The urethra was checked on removal of the scope and there were noted to be no lesions or foreign bodies in the urethra.  A Foley catheter was replaced.  The vaginal tissue on the edge of the exposed mesh was grasped with Allis clamps and injected with 1% lidocaine with epinephrine.  Metzenbaum scissors were used to undermine the vaginal tissue and loosen it from the mesh.  Right angle clamp was used to gently loosen the mesh from the periurethral tissue.  A small portion of exposed mesh was removed from the midline with Metzenbaum scissors.  The vaginal epithelium was then closed with 2-0 Vicryl in an interrupted fashion.  Cystoscopy was then performed again revealing no injury to the urethra.  The patient tolerated the procedure well was taken recovery room in stable condition needle and sponge counts were correct  x2.  Jaquita Folds, MD

## 2022-03-14 NOTE — Anesthesia Procedure Notes (Signed)
Procedure Name: LMA Insertion Date/Time: 03/14/2022 1:29 PM  Performed by: Jessica Priest, CRNAPre-anesthesia Checklist: Patient identified, Emergency Drugs available, Suction available, Patient being monitored and Timeout performed Patient Re-evaluated:Patient Re-evaluated prior to induction Oxygen Delivery Method: Circle system utilized Preoxygenation: Pre-oxygenation with 100% oxygen Induction Type: IV induction Ventilation: Mask ventilation without difficulty LMA: LMA inserted LMA Size: 4.0 Number of attempts: 1 Airway Equipment and Method: Bite block Placement Confirmation: positive ETCO2, breath sounds checked- equal and bilateral and CO2 detector Tube secured with: Tape Dental Injury: Teeth and Oropharynx as per pre-operative assessment

## 2022-03-15 ENCOUNTER — Encounter (HOSPITAL_BASED_OUTPATIENT_CLINIC_OR_DEPARTMENT_OTHER): Payer: Self-pay | Admitting: Obstetrics and Gynecology

## 2022-03-16 NOTE — Anesthesia Postprocedure Evaluation (Signed)
Anesthesia Post Note  Patient: Monique Thomas  Procedure(s) Performed: Lindley Magnus REVISION     Patient location during evaluation: PACU Anesthesia Type: General Level of consciousness: sedated and patient cooperative Pain management: pain level controlled Vital Signs Assessment: post-procedure vital signs reviewed and stable Respiratory status: spontaneous breathing Cardiovascular status: stable Anesthetic complications: no   No notable events documented.  Last Vitals:  Vitals:   03/14/22 1445 03/14/22 1510  BP: (!) 107/49 (!) 123/93  Pulse: 60   Resp: 12   Temp:  (!) 35.7 C  SpO2: 98%     Last Pain:  Vitals:   03/15/22 1157  TempSrc:   PainSc: 3    Pain Goal: Patients Stated Pain Goal: 5 (03/14/22 1256)                 Nolon Nations

## 2022-03-18 ENCOUNTER — Encounter: Payer: Self-pay | Admitting: *Deleted

## 2022-03-18 ENCOUNTER — Encounter: Payer: Self-pay | Admitting: Obstetrics and Gynecology

## 2022-03-21 NOTE — Telephone Encounter (Signed)
Pt was contacted.  She is wondering about a test from anesthesia from her surgery.  I notified the pt that Dr Wannetta Sender was not in the office and she would review it when she gets back.  Pt verbalized understanding

## 2022-03-24 DIAGNOSIS — Z96652 Presence of left artificial knee joint: Secondary | ICD-10-CM | POA: Diagnosis not present

## 2022-03-24 DIAGNOSIS — M25562 Pain in left knee: Secondary | ICD-10-CM | POA: Diagnosis not present

## 2022-03-28 ENCOUNTER — Ambulatory Visit (INDEPENDENT_AMBULATORY_CARE_PROVIDER_SITE_OTHER): Payer: Medicare PPO

## 2022-03-28 VITALS — BP 142/72 | HR 73 | Temp 97.5°F | Wt 137.4 lb

## 2022-03-28 DIAGNOSIS — E039 Hypothyroidism, unspecified: Secondary | ICD-10-CM

## 2022-03-28 DIAGNOSIS — M25562 Pain in left knee: Secondary | ICD-10-CM

## 2022-03-28 NOTE — Progress Notes (Signed)
Patient presents to the office for a nurse visit to have TSH rechecked. Also, patient states that [facility] needed some other labs done in order to treat her appropriately. Patient is having left knee pain. Per Dr. Melford Aase, ok to put new orders in.  Will need to fax results to [facility].

## 2022-03-29 ENCOUNTER — Encounter: Payer: Self-pay | Admitting: Adult Health

## 2022-03-29 LAB — C-REACTIVE PROTEIN: CRP: 4 mg/L (ref <8.0)

## 2022-03-29 LAB — CBC WITH DIFFERENTIAL/PLATELET
Absolute Monocytes: 437 cells/uL (ref 200–950)
Basophils Absolute: 41 cells/uL (ref 0–200)
Basophils Relative: 0.7 %
Eosinophils Absolute: 159 cells/uL (ref 15–500)
Eosinophils Relative: 2.7 %
HCT: 34.3 % — ABNORMAL LOW (ref 35.0–45.0)
Hemoglobin: 11.9 g/dL (ref 11.7–15.5)
Lymphs Abs: 1764 cells/uL (ref 850–3900)
MCH: 30.6 pg (ref 27.0–33.0)
MCHC: 34.7 g/dL (ref 32.0–36.0)
MCV: 88.2 fL (ref 80.0–100.0)
MPV: 8.9 fL (ref 7.5–12.5)
Monocytes Relative: 7.4 %
Neutro Abs: 3499 cells/uL (ref 1500–7800)
Neutrophils Relative %: 59.3 %
Platelets: 361 10*3/uL (ref 140–400)
RBC: 3.89 10*6/uL (ref 3.80–5.10)
RDW: 12.4 % (ref 11.0–15.0)
Total Lymphocyte: 29.9 %
WBC: 5.9 10*3/uL (ref 3.8–10.8)

## 2022-03-29 LAB — TSH: TSH: 2.29 mIU/L (ref 0.40–4.50)

## 2022-03-29 LAB — SEDIMENTATION RATE: Sed Rate: 25 mm/h (ref 0–30)

## 2022-03-31 ENCOUNTER — Ambulatory Visit: Payer: Medicare PPO | Admitting: Adult Health

## 2022-03-31 ENCOUNTER — Encounter: Payer: Self-pay | Admitting: Adult Health

## 2022-03-31 VITALS — BP 122/72 | HR 69 | Temp 97.5°F | Ht 60.5 in | Wt 135.0 lb

## 2022-03-31 DIAGNOSIS — G8929 Other chronic pain: Secondary | ICD-10-CM | POA: Diagnosis not present

## 2022-03-31 DIAGNOSIS — M5442 Lumbago with sciatica, left side: Secondary | ICD-10-CM | POA: Diagnosis not present

## 2022-03-31 MED ORDER — METHOCARBAMOL 500 MG PO TABS: ORAL_TABLET | ORAL | 0 refills | Status: DC

## 2022-03-31 MED ORDER — NAPROXEN SODIUM 220 MG PO TABS: ORAL_TABLET | ORAL | Status: AC

## 2022-03-31 NOTE — Progress Notes (Signed)
Assessment and Plan:  Monique Thomas was seen today for acute visit.  Diagnoses and all orders for this visit:  Chronic midline low back pain with left-sided sciatica Discussion of therapy options for ongoing chronic lumbar pain; est with Dr. Ronnald Ramp as per below.  Long discussion reviewing lifestyle and medical intervention options for chronic back pain; surgery was not recommended Discussed benefit of PT/regular ongoing exercise and stretching Heat/ice Aleve does work well, ibuprofen/meloxicam with limited benefit; after discussion she would like to continue Will try adding muscle relaxer She is agreeable to PT referral, trial of TENS unit If any worsening or not improving would recommend to follow back up with Dr. Ronnald Ramp Go to the ER if you have any new weakness in your legs, have trouble controlling your urine or bowels, or have sudden worsening pain.  -     Ambulatory referral to Physical Therapy -     naproxen sodium (ALEVE) 220 MG tablet; Take 2 tabs twice daily with food as needed for back pain. -     methocarbamol (ROBAXIN) 500 MG tablet; Take 1/2-1 tab three times a day as needed for muscle spasms. May cause sedation, do not drive after taking this medication.  Discussed med's effects and SE's.   Over 20 minutes of exam, counseling, chart review, and critical decision making was performed.   Future Appointments  Date Time Provider [place]  04/27/2022 11:40 AM Jaquita Folds, MD [facility]  08/10/2022  9:30 AM [name], MD [facility] None  02/01/2023 10:00 AM [name], NP [facility] None    ------------------------------------------------------------------------------------------------------------------   HPI BP 122/72   Pulse 69   Temp (!) 97.5 F (36.4 C)   Ht 5' 0.5" (1.537 m)   Wt 135 lb (61.2 kg)   SpO2 99%   BMI 25.93 kg/m  71 y.o.female presents for evaluation of chronic lumbar pain.   She reports this is ongoing, chronic midline lumbar  pain, intermittent with tingling in bil toes. Est with neuro surgery Dr. Ronnald Ramp, was advised arthritic changes, has had steroid injections (limited benefit), recommended PT (previously completed, several years ago, none recent). She has been managing with OTC aleve 1-2 tabs BID which does help, but hasn't taken regularly. Lidocaine patches also help. She also notes having some cramping/spasms in bil hips, L > R, has had knee replaced, tightness in that leg down to calf.   MRI lumbar spine 01/11/2021  multilevel degenerative change, including mild bilateral foraminal and subarticular recess stenosis at L4-L5. No significant canal stenosis.  Last lumbar CT spine 07/22/2019 1. Calcified posterior disc protrusions T11-12 and T12-L1 without definite compressive pathology. 2. Solid  instrumented fusion L1-L4. 3. Progressive degenerative disc disease L4-5. Mild bilateral foraminal encroachment. 4. Left L5 pars defect allows mild grade 1 anterolisthesis L5-S1 without dynamic instability.   Past Medical History:  Diagnosis Date   Anxiety    Arthritis    Back pain, chronic    Depression    GERD (gastroesophageal reflux disease)    History of kidney stones 2015   Hyperlipidemia    Hypothyroidism    Skin abnormality 12/07/2021   face red with 2 blisters intact on forehead due to [place] crean tx for skin cancer   Skin Cancer ([place])    SUI (stress urinary incontinence, female)    wears pads   Thyroid disease    TMJ (temporomandibular joint disorder)    joint has dislocated x 3 in past     Allergies  Allergen Reactions  Celebrex [Celecoxib] Other (See Comments)    "panic attack"   Other Other (See Comments)    Skin bruises very easily and peels back   Topamax [Topiramate] Other (See Comments)    MEMORY   Zofran [Ondansetron] Other (See Comments)    Headache   Entex Lq [Phenylephrine-Guaifenesin] Rash   Fiorinal [Butalbital-Aspirin-Caffeine] Rash    Current Outpatient Medications on  File Prior to Visit  Medication Sig   ALPRAZolam (XANAX) 1 MG tablet TAKE 1/2 TO 1 TABLET BY MOUTH EVERY NIGHT AT BEDTIME. LIMIT TO 5 DAYS PER WEEK TO AVOID ADDICTION   Cholecalciferol (VITAMIN D) 125 MCG (5000 UT) CAPS Take by mouth.   estradiol (ESTRACE) 0.1 MG/GM vaginal cream Place 0.5 g vaginally at bedtime.   estradiol (ESTRACE) 1 MG tablet Take 1 mg by mouth daily.   levothyroxine (SYNTHROID) 88 MCG tablet Take 1 tablet daily except 1/2 tab on Tues/Sat for thyroid on empty stomach with water only.   Magnesium 500 MG CAPS Take by mouth.   No current facility-administered medications on file prior to visit.    ROS: all negative except above.   Physical Exam:  BP 122/72   Pulse 69   Temp (!) 97.5 F (36.4 C)   Ht 5' 0.5" (1.537 m)   Wt 135 lb (61.2 kg)   SpO2 99%   BMI 25.93 kg/m   General Appearance: Well nourished, in no apparent distress. Eyes: PERRLA, conjunctiva no swelling or erythema ENT/Mouth: mask in place; Hearing normal.  Neck: Supple, thyroid normal.  Respiratory: Respiratory effort normal, BS equal bilaterally without rales, rhonchi, wheezing or stridor.  Cardio: RRR with no MRGs. Brisk peripheral pulses without edema.  Abdomen: Soft, + BS.  Non tender, no guarding, rebound, hernias, masses. Lymphatics: Non tender without lymphadenopathy.  Musculoskeletal: Patient is able to ambulate well. Gait is not  Antalgic. Straight leg raising with dorsiflexion + on left with guarding. Sensory exam in the legs are normal. Knee reflexes are normal Ankle reflexes are  diminished bil  Strength is normal and symmetric in arms and legs. There is SI tenderness to palpation.  There is paraspinal muscle spasm.  There is midline tenderness.  ROM of spine with  limited in all spheres due to pain.  Skin: Warm, dry without rashes, lesions, ecchymosis.  Neuro: Normal muscle tone Psych: Awake and oriented X 3, normal affect, Insight and Judgment appropriate.      Monique Ribas,  NP 11:39 AM [facility]

## 2022-04-12 ENCOUNTER — Other Ambulatory Visit: Payer: Self-pay | Admitting: Internal Medicine

## 2022-04-12 DIAGNOSIS — M5416 Radiculopathy, lumbar region: Secondary | ICD-10-CM | POA: Diagnosis not present

## 2022-04-27 ENCOUNTER — Encounter: Payer: Self-pay | Admitting: Obstetrics and Gynecology

## 2022-04-27 ENCOUNTER — Ambulatory Visit (INDEPENDENT_AMBULATORY_CARE_PROVIDER_SITE_OTHER): Payer: Medicare PPO | Admitting: Obstetrics and Gynecology

## 2022-04-27 VITALS — BP 129/58 | HR 72

## 2022-04-27 DIAGNOSIS — Z9889 Other specified postprocedural states: Secondary | ICD-10-CM

## 2022-04-27 DIAGNOSIS — N3281 Overactive bladder: Secondary | ICD-10-CM

## 2022-04-27 MED ORDER — VIBEGRON 75 MG PO TABS: 75.0000 mg | ORAL_TABLET | Freq: Every day | ORAL | 5 refills | Status: DC

## 2022-04-27 NOTE — Progress Notes (Signed)
[facility]  Date of Visit: 04/27/2022  History of Present Illness: Ms. Mckiver is a 71 y.o. female scheduled today for a post-operative visit.   Surgery: s/p sling revision, cystoscopy for vaginal mesh exposure on 03/14/22 and midurethral sling, cystoscopy on 12/09/21   Today she reports she is doing well overall but still having leakage. Denies leakage with cough/ sneeze. But when she gets an urge she cannot make it to the bathroom. Denies vaginal bleeding.     Medications: She has a current medication list which includes the following prescription(s): alprazolam, vitamin d, estradiol, estradiol, levothyroxine, magnesium, methocarbamol, naproxen sodium, and vibegron.   Allergies: Patient is allergic to celebrex [celecoxib], other, topamax [topiramate], zofran [ondansetron], entex lq [phenylephrine-guaifenesin], and fiorinal [butalbital-aspirin-caffeine].   Physical Exam: BP (!) 129/58   Pulse 72    Pelvic Examination: Vagina: Incisions healing well. Sutures are present at incision line and there is not granulation tissue. No tenderness along the anterior or posterior vagina. No apical tenderness. No pelvic masses. No visible or palpable mesh.  POP-Q: Deferred ------------------------------------------------------  Assessment and Plan:  1. Overactive bladder   2. Post-operative state      - Can increase activity and no further lifting restrictions but would wait an additional two weeks for tub baths/ swimming or intercourse due to presence of sutures.  - Continue estrace cream twice a week - For OAB, started Gemtesa '75mg'$  daily.   Return 2 weeks  Jaquita Folds, MD

## 2022-05-03 DIAGNOSIS — M25562 Pain in left knee: Secondary | ICD-10-CM | POA: Insufficient documentation

## 2022-05-05 DIAGNOSIS — M5416 Radiculopathy, lumbar region: Secondary | ICD-10-CM | POA: Diagnosis not present

## 2022-05-05 DIAGNOSIS — Z6825 Body mass index (BMI) 25.0-25.9, adult: Secondary | ICD-10-CM | POA: Diagnosis not present

## 2022-05-09 DIAGNOSIS — M5416 Radiculopathy, lumbar region: Secondary | ICD-10-CM | POA: Diagnosis not present

## 2022-05-09 DIAGNOSIS — Z6825 Body mass index (BMI) 25.0-25.9, adult: Secondary | ICD-10-CM | POA: Diagnosis not present

## 2022-05-09 DIAGNOSIS — M47816 Spondylosis without myelopathy or radiculopathy, lumbar region: Secondary | ICD-10-CM | POA: Diagnosis not present

## 2022-05-09 DIAGNOSIS — M48061 Spinal stenosis, lumbar region without neurogenic claudication: Secondary | ICD-10-CM | POA: Diagnosis not present

## 2022-05-11 DIAGNOSIS — M25562 Pain in left knee: Secondary | ICD-10-CM | POA: Diagnosis not present

## 2022-05-13 ENCOUNTER — Ambulatory Visit (INDEPENDENT_AMBULATORY_CARE_PROVIDER_SITE_OTHER): Payer: Medicare PPO | Admitting: Obstetrics and Gynecology

## 2022-05-13 ENCOUNTER — Encounter: Payer: Self-pay | Admitting: Obstetrics and Gynecology

## 2022-05-13 VITALS — BP 123/72 | HR 81 | Wt 135.0 lb

## 2022-05-13 DIAGNOSIS — N3281 Overactive bladder: Secondary | ICD-10-CM

## 2022-05-13 MED ORDER — MIRABEGRON ER 25 MG PO TB24: 25.0000 mg | ORAL_TABLET | Freq: Every day | ORAL | 5 refills | Status: DC

## 2022-05-13 NOTE — Progress Notes (Signed)
[facility]  Date of Visit: 05/13/2022  History of Present Illness: Ms. Monique Thomas is a 71 y.o. female scheduled today for a post-operative visit.   Surgery: s/p sling revision, cystoscopy for vaginal mesh exposure on 03/14/22 and midurethral sling, cystoscopy on 12/09/21   Monique Thomas and it is working well for her but it was expensive ($60 for 30 day supply). She denies vaginal bleeding.     Medications: She has a current medication list which includes the following prescription(s): mirabegron er, alprazolam, vitamin d, estradiol, estradiol, levothyroxine, magnesium, methocarbamol, naproxen sodium, and vibegron.   Allergies: Patient is allergic to celebrex [celecoxib], other, topamax [topiramate], zofran [ondansetron], entex lq [phenylephrine-guaifenesin], and fiorinal [butalbital-aspirin-caffeine].   Physical Exam: BP 123/72   Pulse 81   Wt 135 lb (61.2 kg)   BMI 25.93 kg/m    Pelvic Examination: Vagina: Incisions healing well. Sutures have healed in suburethral space. No tenderness along the anterior or posterior vagina. No apical tenderness. No pelvic masses. No visible or palpable mesh.  POP-Q: Deferred ------------------------------------------------------  Assessment and Plan:  1. Overactive bladder     -Can return to all regular activity including tub baths, intercourse and exercise.  - Continue estrace cream twice a week - We discussed options for overactive bladder including trying another medication, or third line therapies (PTNS, Botox and SNM). She prefers to try another medication. Has already tried myrbetriq, trospium and oxybutynin in the past but does not remember how well they worked for her.  - Prescribed Myrbetriq '25mg'$ - a 90 day supply is less expensive for her. She will complete the Gemtesa that she has already bought then transition to the Myrbetriq.   Return 2-3 months  Monique Folds, MD

## 2022-05-31 DIAGNOSIS — M5416 Radiculopathy, lumbar region: Secondary | ICD-10-CM | POA: Diagnosis not present

## 2022-06-20 DIAGNOSIS — M5416 Radiculopathy, lumbar region: Secondary | ICD-10-CM | POA: Diagnosis not present

## 2022-06-20 DIAGNOSIS — M48061 Spinal stenosis, lumbar region without neurogenic claudication: Secondary | ICD-10-CM | POA: Diagnosis not present

## 2022-06-28 DIAGNOSIS — M5416 Radiculopathy, lumbar region: Secondary | ICD-10-CM | POA: Diagnosis not present

## 2022-07-21 ENCOUNTER — Encounter: Payer: Self-pay | Admitting: Nurse Practitioner

## 2022-07-21 ENCOUNTER — Ambulatory Visit: Payer: Medicare PPO | Admitting: Nurse Practitioner

## 2022-07-21 VITALS — BP 144/82 | HR 71 | Temp 97.5°F | Ht 60.5 in | Wt 133.0 lb

## 2022-07-21 DIAGNOSIS — E039 Hypothyroidism, unspecified: Secondary | ICD-10-CM

## 2022-07-21 DIAGNOSIS — B351 Tinea unguium: Secondary | ICD-10-CM

## 2022-07-21 DIAGNOSIS — G2581 Restless legs syndrome: Secondary | ICD-10-CM | POA: Diagnosis not present

## 2022-07-21 DIAGNOSIS — Z23 Encounter for immunization: Secondary | ICD-10-CM

## 2022-07-21 DIAGNOSIS — M48061 Spinal stenosis, lumbar region without neurogenic claudication: Secondary | ICD-10-CM | POA: Diagnosis not present

## 2022-07-21 DIAGNOSIS — K2100 Gastro-esophageal reflux disease with esophagitis, without bleeding: Secondary | ICD-10-CM | POA: Diagnosis not present

## 2022-07-21 DIAGNOSIS — M5416 Radiculopathy, lumbar region: Secondary | ICD-10-CM | POA: Diagnosis not present

## 2022-07-21 DIAGNOSIS — M47816 Spondylosis without myelopathy or radiculopathy, lumbar region: Secondary | ICD-10-CM | POA: Diagnosis not present

## 2022-07-21 MED ORDER — TAVABOROLE 5 % EX SOLN: 1.0000 | Freq: Every day | CUTANEOUS | 1 refills | Status: DC

## 2022-07-21 MED ORDER — PANTOPRAZOLE SODIUM 40 MG PO TBEC: 40.0000 mg | DELAYED_RELEASE_TABLET | Freq: Every day | ORAL | 0 refills | Status: DC

## 2022-07-21 NOTE — Patient Instructions (Signed)
Gastroesophageal Reflux Disease, Adult  Gastroesophageal reflux (GER) happens when acid from the stomach flows up into the tube that connects the mouth and the stomach (esophagus). Normally, food travels down the esophagus and stays in the stomach to be digested. With GER, food and stomach acid sometimes move back up into the esophagus. You may have a disease called gastroesophageal reflux disease (GERD) if the reflux: Happens often. Causes frequent or very bad symptoms. Causes problems such as damage to the esophagus. When this happens, the esophagus becomes sore and swollen. Over time, GERD can make small holes (ulcers) in the lining of the esophagus. What are the causes? This condition is caused by a problem with the muscle between the esophagus and the stomach. When this muscle is weak or not normal, it does not close properly to keep food and acid from coming back up from the stomach. The muscle can be weak because of: Tobacco use. Pregnancy. Having a certain type of hernia (hiatal hernia). Alcohol use. Certain foods and drinks, such as coffee, chocolate, onions, and peppermint. What increases the risk? Being overweight. Having a disease that affects your connective tissue. Taking NSAIDs, such a ibuprofen. What are the signs or symptoms? Heartburn. Difficult or painful swallowing. The feeling of having a lump in the throat. A bitter taste in the mouth. Bad breath. Having a lot of saliva. Having an upset or bloated stomach. Burping. Chest pain. Different conditions can cause chest pain. Make sure you see your doctor if you have chest pain. Shortness of breath or wheezing. A long-term cough or a cough at night. Wearing away of the surface of teeth (tooth enamel). Weight loss. How is this treated? Making changes to your diet. Taking medicine. Having surgery. Treatment will depend on how bad your symptoms are. Follow these instructions at home: Eating and drinking  Follow a  diet as told by your doctor. You may need to avoid foods and drinks such as: Coffee and tea, with or without caffeine. Drinks that contain alcohol. Energy drinks and sports drinks. Bubbly (carbonated) drinks or sodas. Chocolate and cocoa. Peppermint and mint flavorings. Garlic and onions. Horseradish. Spicy and acidic foods. These include peppers, chili powder, curry powder, vinegar, hot sauces, and BBQ sauce. Citrus fruit juices and citrus fruits, such as oranges, lemons, and limes. Tomato-based foods. These include red sauce, chili, salsa, and pizza with red sauce. Fried and fatty foods. These include donuts, french fries, potato chips, and high-fat dressings. High-fat meats. These include hot dogs, rib eye steak, sausage, ham, and bacon. High-fat dairy items, such as whole milk, butter, and cream cheese. Eat small meals often. Avoid eating large meals. Avoid drinking large amounts of liquid with your meals. Avoid eating meals during the 2-3 hours before bedtime. Avoid lying down right after you eat. Do not exercise right after you eat. Lifestyle  Do not smoke or use any products that contain nicotine or tobacco. If you need help quitting, ask your doctor. Try to lower your stress. If you need help doing this, ask your doctor. If you are overweight, lose an amount of weight that is healthy for you. Ask your doctor about a safe weight loss goal. General instructions Pay attention to any changes in your symptoms. Take over-the-counter and prescription medicines only as told by your doctor. Do not take aspirin, ibuprofen, or other NSAIDs unless your doctor says it is okay. Wear loose clothes. Do not wear anything tight around your waist. Raise (elevate) the head of your bed about   6 inches (15 cm). You may need to use a wedge to do this. Avoid bending over if this makes your symptoms worse. Keep all follow-up visits. Contact a doctor if: You have new symptoms. You lose weight and you  do not know why. You have trouble swallowing or it hurts to swallow. You have wheezing or a cough that keeps happening. You have a hoarse voice. Your symptoms do not get better with treatment. Get help right away if: You have sudden pain in your arms, neck, jaw, teeth, or back. You suddenly feel sweaty, dizzy, or light-headed. You have chest pain or shortness of breath. You vomit and the vomit is green, yellow, or black, or it looks like blood or coffee grounds. You faint. Your poop (stool) is red, bloody, or black. You cannot swallow, drink, or eat. These symptoms may represent a serious problem that is an emergency. Do not wait to see if the symptoms will go away. Get medical help right away. Call your local emergency services (911 in the [place] Do not drive yourself to the hospital. Summary If a person has gastroesophageal reflux disease (GERD), food and stomach acid move back up into the esophagus and cause symptoms or problems such as damage to the esophagus. Treatment will depend on how bad your symptoms are. Follow a diet as told by your doctor. Take all medicines only as told by your doctor. This information is not intended to replace advice given to you by your health care provider. Make sure you discuss any questions you have with your health care provider. Document Revised: 03/16/2020 Document Reviewed: 03/16/2020 Elsevier Patient Education  2023 [employer] Inc.  

## 2022-07-21 NOTE — Progress Notes (Signed)
Assessment and Plan:  Monique Thomas was seen today for an episodic visit.  Diagnoses and all order for this visit:  1. Need for influenza vaccination Administered Patient tolerated well  - Flu vaccine HIGH DOSE PF (Fluzone High dose)  2. Gastroesophageal reflux disease with esophagitis without hemorrhage Pepcid sample provided. Refuses EGD at this time Start daily Pantoprazole as directed-take daily. Lifestyle modification:  wt loss, avoid meals 2-3h before bedtime. Consider eliminating food triggers:  chocolate, caffeine, EtOH, acid/spicy food. Possible thyroid US to assess for any underlying nodule if s/s fail to improve.  Palpable thyroid exam negative.   3.  Hypothyroidism Controlled. Continue Levothyroxine. Reminded to take on an empty stomach 30-31mns before food.  Stop any Biotin Supplement 48-72 hours before next TSH level to reduce the risk of falsely low TSH levels. Continue to monitor.     4.  Onychomycosis Start topical Tavaborole as directed. Continue to monitor   Meds ordered this encounter  Medications   pantoprazole (PROTONIX) 40 MG tablet    Sig: Take 1 tablet (40 mg total) by mouth daily.    Dispense:  90 tablet    Refill:  0    Order Specific Question:   Supervising Provider    Answer:   MUnk Pinto[[id number]]   Tavaborole 5 % SOLN    Sig: Apply 1 Dose topically daily.    Dispense:  10 mL    Refill:  1    Order Specific Question:   Supervising Provider    Answer:   MUnk Pinto[[id number]  Notify office for further evaluation and treatment, questions or concerns if s/s fail to improve. The risks and benefits of my recommendations, as well as other treatment options were discussed with the patient today. Questions were answered.  Further disposition pending results of labs. Discussed med's effects and SE's.    Over 20 minutes of exam, counseling, chart review, and critical decision making was performed.   Future Appointments  Date Time  Provider [place] 07/26/2022  9:20 AM SJaquita Folds MD [facility] 08/10/2022  9:30 AM MUnk Pinto MD [facility] None  02/01/2023 10:00 AM WAlycia Rossetti NP [facility] None    ------------------------------------------------------------------------------------------------------------------   HPI BP (!) 144/82   Pulse 71   Temp (!) 97.5 F (36.4 C)   Ht 5' 0.5" (1.537 m)   Wt 133 lb (60.3 kg)   SpO2 99%   BMI 25.55 kg/m   71 y.o.female presents for evaluation of "gurgling" sound in throat. Intermittent.  Audible enough for husband to hear at night when lying down with him as well as daughter who reports hearing over the phone when talking.   Most often the sound occurs when laying down at night.   She has a hx of GERD.  She takes Nexium PRN.  She does not take a daily H2 blocker. She has a difficult time taking pills.  She reports Nexium helps when taking but she does not take daily.    Last Colonoscopy 2008.  She has refused EGD in the past.  Fears procedure, as dad had EGD for throat stretching, had a feeding tube placed then passed.  She is deferring EGD procedure at this time. Tearful when discussing.  Sometimes has difficulty swallowing, can't get things down.  She feels like this may be d/t her not being able to open her mouth wide and her jaw locking. She follows with dentist for tmt of TMJ.   She has  a hx of hypothyroidism.  TSH levels have been WNL over the last year.    She also endorses bilateral thumb nail fungus present for the last few weeks.  She has white nails with dark discolorations at the base of the nail.  She has been soaking in hydrogen peroxide.    Past Medical History:  Diagnosis Date   Anxiety    Arthritis    Back pain, chronic    Depression    GERD (gastroesophageal reflux disease)    History of kidney stones 2015   Hyperlipidemia    Hypothyroidism    Skin abnormality 12/07/2021   face red with 2 blisters intact on  forehead due to [place] crean tx for skin cancer   Skin Cancer ([place])    SUI (stress urinary incontinence, female)    wears pads   Thyroid disease    TMJ (temporomandibular joint disorder)    joint has dislocated x 3 in past     Allergies  Allergen Reactions   Celebrex [Celecoxib] Other (See Comments)    "panic attack"   Other Other (See Comments)    Skin bruises very easily and peels back   Topamax [Topiramate] Other (See Comments)    MEMORY   Zofran [Ondansetron] Other (See Comments)    Headache   Entex Lq [Phenylephrine-Guaifenesin] Rash   Fiorinal [Butalbital-Aspirin-Caffeine] Rash    Current Outpatient Medications on File Prior to Visit  Medication Sig   ALPRAZolam (XANAX) 1 MG tablet TAKE 1/2 TO 1 TABLET BY MOUTH EVERY NIGHT AT BEDTIME. LIMIT TO 5 DAYS PER WEEK TO AVOID ADDICTION   Cholecalciferol (VITAMIN D) 125 MCG (5000 UT) CAPS Take by mouth.   estradiol (ESTRACE) 0.1 MG/GM vaginal cream Place 0.5 g vaginally at bedtime.   estradiol (ESTRACE) 1 MG tablet Take 1 mg by mouth daily.   levothyroxine (SYNTHROID) 88 MCG tablet Take 1 tablet daily except 1/2 tab on Tues/Sat for thyroid on empty stomach with water only.   Magnesium 500 MG CAPS Take by mouth.   methocarbamol (ROBAXIN) 500 MG tablet Take 1/2-1 tab three times a day as needed for muscle spasms. May cause sedation, do not drive after taking this medication.   mirabegron ER (MYRBETRIQ) 25 MG TB24 tablet Take 1 tablet (25 mg total) by mouth daily.   naproxen sodium (ALEVE) 220 MG tablet Take 2 tabs twice daily with food as needed for back pain.   Vibegron 75 MG TABS Take 75 mg by mouth daily.   No current facility-administered medications on file prior to visit.    ROS: all negative except what is noted in the HPI.   Physical Exam:  BP (!) 144/82   Pulse 71   Temp (!) 97.5 F (36.4 C)   Ht 5' 0.5" (1.537 m)   Wt 133 lb (60.3 kg)   SpO2 99%   BMI 25.55 kg/m   General Appearance: NAD.  Awake, conversant  and cooperative. Eyes: PERRLA, EOMs intact.  Sclera white.  Conjunctiva without erythema. Sinuses: No frontal/maxillary tenderness.  No nasal discharge. Nares patent.  ENT/Mouth: Ext aud canals clear.  Bilateral TMs w/DOL and without erythema or bulging. Hearing intact.  Posterior pharynx without swelling or exudate.  Tonsils without swelling or erythema.  Neck: Supple.  No masses, nodules or thyromegaly. Respiratory: Effort is regular with non-labored breathing. Breath sounds are equal bilaterally without rales, rhonchi, wheezing or stridor.  Cardio: RRR with no MRGs. Brisk peripheral pulses without edema.  Abdomen: Active BS in all four  quadrants.  Soft and non-tender without guarding, rebound tenderness, hernias or masses. Lymphatics: Non tender without lymphadenopathy.  Musculoskeletal: Full ROM, 5/5 strength, normal ambulation.  No clubbing or cyanosis. Skin: Bilateral thumbs white white thick nail medial sides, dark black discoloration at base of right thumb nail.  Surrounding skin appropriate color for ethnicity. Warm  Neuro: CN II-XII grossly normal. Normal muscle tone without cerebellar symptoms and intact sensation.   Psych: AO X 3,  appropriate mood and affect, insight and judgment.     Darrol Jump, NP 9:13 AM [facility]

## 2022-07-22 ENCOUNTER — Ambulatory Visit: Payer: Medicare PPO | Admitting: Obstetrics and Gynecology

## 2022-07-26 ENCOUNTER — Encounter: Payer: Self-pay | Admitting: Obstetrics and Gynecology

## 2022-07-26 ENCOUNTER — Ambulatory Visit (INDEPENDENT_AMBULATORY_CARE_PROVIDER_SITE_OTHER): Payer: Medicare PPO | Admitting: Obstetrics and Gynecology

## 2022-07-26 VITALS — BP 107/50 | HR 70

## 2022-07-26 DIAGNOSIS — N3281 Overactive bladder: Secondary | ICD-10-CM | POA: Diagnosis not present

## 2022-07-26 NOTE — Progress Notes (Signed)
[facility] Return Visit  SUBJECTIVE  History of Present Illness: Monique Thomas is a 71 y.o. female seen in follow-up for OAB. Plan at last visit was Myrbetriq '25mg'$  daily (previously tried [place] trospium and oxybutynin).   Myrbetriq is working well. She is voiding 3-4 times per day, 3 times at night. She tries not to drink before bed. Has been waking up less since she started a new medication at night (not sure of the name). Not leaking at all during the day.    Surgery: s/p sling revision, cystoscopy for vaginal mesh exposure on 03/14/22 and midurethral sling, cystoscopy on 12/09/21 .   Past Medical History: Patient  has a past medical history of Anxiety, Arthritis, Back pain, chronic, Depression, GERD (gastroesophageal reflux disease), History of kidney stones (2015), Hyperlipidemia, Hypothyroidism, Skin abnormality (12/07/2021), Skin Cancer ([place]), SUI (stress urinary incontinence, female), Thyroid disease, and TMJ (temporomandibular joint disorder).   Past Surgical History: She  has a past surgical history that includes Abdominal hysterectomy; Cesarean section; Hernia repair; Appendectomy; Tonsillectomy and adenoidectomy; Spine surgery; Laparoscopic lysis intestinal adhesions; Maximum access (mas)posterior lumbar interbody fusion (plif) 1 level (N/A, 04/16/2015); Anterior lat lumbar fusion (N/A, 04/07/2017); Total knee arthroplasty (Left, 07/17/2018); Extracorporeal shock wave lithotripsy (2015); Bladder suspension (2013); Bladder suspension (N/A, 12/09/2021); Cystoscopy (N/A, 12/09/2021); and Excision of mesh (N/A, 03/14/2022).   Medications: She has a current medication list which includes the following prescription(s): alprazolam, vitamin d, estradiol, estradiol, levothyroxine, magnesium, methocarbamol, mirabegron er, naproxen sodium, pantoprazole, tavaborole, and vibegron.   Allergies: Patient is allergic to celebrex [celecoxib], other, topamax [topiramate], zofran  [ondansetron], entex lq [phenylephrine-guaifenesin], and fiorinal [butalbital-aspirin-caffeine].   Social History: Patient  reports that she has never smoked. She has never used smokeless tobacco. She reports that she does not drink alcohol and does not use drugs.      OBJECTIVE     Physical Exam: Vitals:   07/26/22 0955  BP: (!) 107/50  Pulse: 70   Gen: No apparent distress, A&O x 3.  Detailed Urogynecologic Evaluation:  Deferred.    ASSESSMENT AND PLAN    [name] is a 71 y.o. with:  1. Overactive bladder    - Continue with Myrbetriq '25mg'$  daily.  - Can follow up 1 year or sooner if needed  Jaquita Folds, MD  Time spent: I spent 17 minutes dedicated to the care of this patient on the date of this encounter to include pre-visit review of records, face-to-face time with the patient  and post visit documentation and ordering medication/ testing.

## 2022-07-28 ENCOUNTER — Telehealth: Payer: Self-pay | Admitting: Nurse Practitioner

## 2022-07-28 NOTE — Telephone Encounter (Signed)
Patient states that she has been waiting since appt on 11/2 for antifungal cream to be filled. Pharmacy states that they are waiting on insurance. Does she need a PA or is there an alternative medication she can try?

## 2022-07-29 ENCOUNTER — Other Ambulatory Visit: Payer: Self-pay | Admitting: Nurse Practitioner

## 2022-07-29 MED ORDER — TERBINAFINE HCL 250 MG PO TABS: 250.0000 mg | ORAL_TABLET | Freq: Every day | ORAL | 0 refills | Status: DC

## 2022-08-10 ENCOUNTER — Ambulatory Visit: Payer: Medicare PPO | Admitting: Internal Medicine

## 2022-08-22 ENCOUNTER — Ambulatory Visit: Payer: Medicare PPO | Admitting: Internal Medicine

## 2022-08-22 ENCOUNTER — Encounter: Payer: Self-pay | Admitting: Internal Medicine

## 2022-08-22 VITALS — BP 128/70 | HR 78 | Temp 97.6°F | Resp 16 | Ht 60.6 in | Wt 135.6 lb

## 2022-08-22 DIAGNOSIS — Z79899 Other long term (current) drug therapy: Secondary | ICD-10-CM | POA: Diagnosis not present

## 2022-08-22 DIAGNOSIS — E60 Dietary zinc deficiency: Secondary | ICD-10-CM

## 2022-08-22 DIAGNOSIS — E782 Mixed hyperlipidemia: Secondary | ICD-10-CM

## 2022-08-22 DIAGNOSIS — E559 Vitamin D deficiency, unspecified: Secondary | ICD-10-CM

## 2022-08-22 DIAGNOSIS — R0989 Other specified symptoms and signs involving the circulatory and respiratory systems: Secondary | ICD-10-CM | POA: Diagnosis not present

## 2022-08-22 DIAGNOSIS — G2581 Restless legs syndrome: Secondary | ICD-10-CM

## 2022-08-22 DIAGNOSIS — R7309 Other abnormal glucose: Secondary | ICD-10-CM

## 2022-08-22 DIAGNOSIS — E538 Deficiency of other specified B group vitamins: Secondary | ICD-10-CM

## 2022-08-22 DIAGNOSIS — M5440 Lumbago with sciatica, unspecified side: Secondary | ICD-10-CM

## 2022-08-22 DIAGNOSIS — E639 Nutritional deficiency, unspecified: Secondary | ICD-10-CM | POA: Diagnosis not present

## 2022-08-22 DIAGNOSIS — G8929 Other chronic pain: Secondary | ICD-10-CM

## 2022-08-22 DIAGNOSIS — E611 Iron deficiency: Secondary | ICD-10-CM

## 2022-08-22 DIAGNOSIS — E039 Hypothyroidism, unspecified: Secondary | ICD-10-CM | POA: Diagnosis not present

## 2022-08-22 DIAGNOSIS — B351 Tinea unguium: Secondary | ICD-10-CM

## 2022-08-22 MED ORDER — ROPINIROLE HCL 0.5 MG PO TABS: ORAL_TABLET | ORAL | 0 refills | Status: DC

## 2022-08-22 NOTE — Patient Instructions (Signed)

## 2022-08-22 NOTE — Progress Notes (Signed)
Future Appointments  Date Time Provider Department  08/22/2022                   6 mo ov  3:30 PM [name], MD [facility]  02/01/2023                  Wellness  10:00 AM [name], NP [facility]  07/27/2023 10:00 AM Berton Mount, NP [facility]    History of Present Illness:       This very nice 71 y.o. MWF presents for 6 month follow up with HTN, HLD, Pre-Diabetes, GERD, RLS and Vitamin D Deficiency. Aortic Atherosclerosis was found on CT scan on 09/25/2019.   Patient related havinbg ongoing problems with Chronic back pain followed by Dr Sherley Bounds, Neurosurgeon & also going to pain Mgmt for EDSI. She relates ongoing  c/o "pins & needles" tingling to burning pains of the bilat lower extremities. Still has c/o Restless Legs . Says she can only tolerate 1/2 tablet of the Requip 5 mg - limited by sedation. Relates the sx's are continuous during waking houts.         Patient has been followed expectantly since 2008 for labile HTN & BP has been controlled at home. Today's BP is at goal - 128/70. Patient has had no complaints of any cardiac type chest pain, palpitations, dyspnea / orthopnea / PND, dizziness, claudication, or dependent edema.       Hyperlipidemia is controlled with diet & meds. Patient denies myalgias or other med SE's. Last Lipids were at goal:  Lab Results  Component Value Date   CHOL 197 02/01/2022   HDL 75 02/01/2022   LDLCALC 98 02/01/2022   TRIG 147 02/01/2022   CHOLHDL 2.6 02/01/2022         Patient has been on Thyroid Replacement since the 1980's    Also, the patient has been monitored expectantly for abnormal glucose and has had no symptoms of reactive hypoglycemia, diabetic polys, paresthesias or visual blurring.  Last A1c was normal & at goal:  Lab Results  Component Value Date   HGBA1C 5.3 06/30/2021         Further, the patient also has history of Vitamin D Deficiency and supplements vitamin D without any suspected  side-effects. Last vitamin D was at goal:  Lab Results  Component Value Date   VD25OH 78 01/28/2021       Current Outpatient Medications  Medication Instructions   ALPRAZolam (XANAX) 1 MG tablet TAKE 1/2 TO 1 TABLET AT BEDTIME   VITAMIN D 5000 u Daily    estradiol (ESTRACE) 1 mg Daily   estradiol (ESTRACE) 0.5 g, Vaginal, Nightly   levothyroxine 88 MCG tablet Take 1 tablet daily except 1/2 tab on Tues/Sat    Magnesium 500 MG CAPS Oral   methocarbamol  500 MG tablet Take 1/2-1 tab three times a day as needed    mirabegron ER (MYRBETRIQ)  25 mg  Daily   naproxen sodium (ALEVE) 220 MG tablet Take 2 tabs twice daily with food as needed   pantoprazole (PROTONIX)   40 mg Daily   Tavaborole 5 % SOLN 1 Dose, Daily   terbinafine (LAMISIL)   250 mg Daily   Vibegron   75 mg Daily     Allergies  Allergen Reactions   Celebrex [Celecoxib] Other (See Comments)    "panic attack"   Other Other (See Comments)    Skin bruises very  easily and peels back   Topamax [Topiramate] Other (See Comments)    MEMORY   Entex Lq [Phenylephrine-Guaifenesin] Rash   Fiorinal [Butalbital-Aspirin-Caffeine] Rash     PMHx:   Past Medical History:  Diagnosis Date   Anemia    Anxiety    Arthritis    Back pain, chronic    Cancer (HCC)  2 weeks ago    skin cancer  left anterior wrist   and right side of  face      Depression    Diverticulosis 09/25/2019   GERD (gastroesophageal reflux disease)    Hyperlipidemia    Hypothyroidism    Kidney stone    2015   Thyroid disease    Umbilical hernia    vitamin D deficiency      Immunization History  Administered Date(s) Administered   Influenza Split 06/29/2015   Influenza Whole 05/21/2013   Influenza, High Dose  05/30/2016, 06/13/2017, 06/18/2018, 06/24/2019, 07/02/2020   Influenza,inj,quad 06/19/2017   Influenza-Unspecified 06/09/2014, 06/20/2015   PPD Test 09/23/2013, 10/01/2014, 02/26/2016   Pneumococcal Conjugate-13 05/03/2017   Pneumococcal  Polysaccharide-23 09/23/2013   Pneumococcal-23 06/19/2017   Td 09/19/2005   Tdap 10/07/2015   Zoster 03/27/2014   Zoster Recombinat (Shingrix) 12/03/2020     Past Surgical History:  Procedure Laterality Date   ABDOMINAL HYSTERECTOMY     ANTERIOR LAT LUMBAR FUSION N/A 04/07/2017   Procedure: LUMBAR ONE-TWO ANTERIOR LATERAL LUMBAR FUSION WITH LATERAL PLATE;  Surgeon: Eustace Moore, MD;  Location: [place];  Service: Neurosurgery;  Laterality: N/A;  Right side approach   APPENDECTOMY     CESAREAN SECTION     HERNIA REPAIR     LAPAROSCOPIC LYSIS INTESTINAL ADHESIONS     MAXIMUM ACCESS (MAS)POSTERIOR LUMBAR INTERBODY FUSION (PLIF) 1 LEVEL N/A 04/16/2015   Procedure: masPLIF  - L2-L3 ;  Surgeon: Eustace Moore, MD;  Location: [place] [facility];  Service: Neurosurgery;  Laterality: N/A;  masPLIF  - L2-L3    SPINE SURGERY     lumbar   TONSILLECTOMY AND ADENOIDECTOMY     TOTAL KNEE ARTHROPLASTY Left 07/17/2018   Procedure: LEFT TOTAL KNEE ARTHROPLASTY;  Surgeon: Paralee Cancel, MD;  Location: [facility];  Service: Orthopedics;  Laterality: Left;  70 mins    FHx:    Reviewed / unchanged  SHx:    Reviewed / unchanged   Systems Review:  Constitutional: Denies fever, chills, wt changes, headaches, insomnia, fatigue, night sweats, change in appetite. Eyes: Denies redness, blurred vision, diplopia, discharge, itchy, watery eyes.  ENT: Denies discharge, congestion, post nasal drip, epistaxis, sore throat, earache, hearing loss, dental pain, tinnitus, vertigo, sinus pain, snoring.  CV: Denies chest pain, palpitations, irregular heartbeat, syncope, dyspnea, diaphoresis, orthopnea, PND, claudication or edema. Respiratory: denies cough, dyspnea, DOE, pleurisy, hoarseness, laryngitis, wheezing.  Gastrointestinal: Denies dysphagia, odynophagia, heartburn, reflux, water brash, abdominal pain or cramps, nausea, vomiting, bloating, diarrhea, constipation, hematemesis, melena, hematochezia  or  hemorrhoids. Genitourinary: Denies dysuria, frequency, urgency, nocturia, hesitancy, discharge, hematuria or flank pain. Musculoskeletal: Denies arthralgias, myalgias, stiffness, jt. swelling, pain, limping or strain/sprain.  Skin: Denies pruritus, rash, hives, warts, acne, eczema or change in skin lesion(s). Neuro: No weakness, tremor, incoordination, spasms, paresthesia or pain. Psychiatric: Denies confusion, memory loss or sensory loss. Endo: Denies change in weight, skin or hair change.  Heme/Lymph: No excessive bleeding, bruising or enlarged lymph nodes.  Physical Exam  BP 128/70   Pulse 78   Temp 97.6 F (36.4 C)   Resp 16  Ht 5' 0.6" (1.539 m)   Wt 135 lb 9.6 oz (61.5 kg)   SpO2 98%   BMI 25.96 kg/m   Appears  well nourished, well groomed  and in no distress.  Eyes: PERRLA, EOMs, conjunctiva no swelling or erythema. Sinuses: No frontal/maxillary tenderness ENT/Mouth: EAC's clear, TM's nl w/o erythema, bulging. Nares clear w/o erythema, swelling, exudates. Oropharynx clear without erythema or exudates. Oral hygiene is good. Tongue normal, non obstructing. Hearing intact.  Neck: Supple. Thyroid not palpable. Car 2+/2+ without bruits, nodes or JVD. Chest: Respirations nl with BS clear & equal w/o rales, rhonchi, wheezing or stridor.  Cor: Heart sounds normal w/ regular rate and rhythm without sig. murmurs, gallops, clicks or rubs. Peripheral pulses normal and equal  without edema.  Abdomen: Soft & bowel sounds normal. Non-tender w/o guarding, rebound, hernias, masses or organomegaly.  Lymphatics: Unremarkable.  Musculoskeletal: Full ROM all peripheral extremities, joint stability, 5/5 strength and normal gait.  Skin: Warm, dry without exposed rashes, lesions or ecchymosis apparent.  Neuro: Cranial nerves intact, reflexes equal bilaterally. Sensory-motor testing grossly intact. Tendon reflexes grossly intact.  Pysch: Alert & oriented x 3.  Insight and judgement nl &  appropriate. No ideations.  Assessment and Plan:  1. Labile hypertension   - Continue medication, monitor blood pressure at home.  - Continue DASH diet.  Reminder to go to the ER if any CP,  SOB, nausea, dizziness, severe HA, changes vision/speech.   - CBC with Differential/Platelet - COMPLETE METABOLIC PANEL WITH GFR - Magnesium - TSH  2. Hyperlipidemia, mixed   - Continue diet/meds, exercise  & lifestyle modifications.  - Continue monitor periodic cholesterol/liver & renal functions    - Lipid panel - TSH  3. Abnormal glucose   - Continue diet, exercise  - Lifestyle modifications.  - Monitor appropriate labs   - Hemoglobin A1c - Insulin, random  4. Vitamin D deficiency   - Continue supplementation.   - VITAMIN D 25 Hydroxy   5. Hypothyroidism  - TSH  6. Chronic bilateral low back pain with sciatica   7. Restless legs  - rOPINIRole (REQUIP) 0.5 MG tablet;  Take  1/2 to 1 tablet  3 x / day  as needed  for Restless Legs   Dispense: 90 tablet; Refill: 0  8. Vitamin B12 deficiency  - Vitamin B12 - Methylmalonic acid, serum  9. Iron deficiency  - Ferritin - Iron, Total/Total Iron Binding Cap  10. Zinc deficiency  - Zinc  11. Nutritional deficiency  - VITAMIN D 25 Hydroxy  - Ferritin - Iron, Total/Total Iron Binding Cap - Zinc  12. Medication management  - CBC with Differential/Platelet - COMPLETE METABOLIC PANEL WITH GFR - Magnesium - Lipid panel - TSH - Hemoglobin A1c - Insulin, random - VITAMIN D 25 Hydroxy  - Ferritin - Iron, Total/Total Iron Binding Cap - Zinc - Vitamin B12 - Methylmalonic acid, serum           Discussed  regular exercise, BP monitoring, weight control to achieve/maintain BMI less than 25 and discussed med and SE's. Recommended labs to assess and monitor clinical status with further disposition pending results of labs.  I discussed the assessment and treatment plan with the patient. The patient was provided an  opportunity to ask questions and all were answered. The patient agreed with the plan and demonstrated an understanding of the instructions.  I provided over 30 minutes of exam, counseling, chart review and  complex critical decision making.  The patient was advised to call back or seek an in-person evaluation if the symptoms worsen or if the condition fails to improve as anticipated.   Kirtland Bouchard, MD

## 2022-08-23 ENCOUNTER — Other Ambulatory Visit: Payer: Self-pay | Admitting: Internal Medicine

## 2022-08-23 NOTE — Progress Notes (Signed)
<><><><><><><><><><><><><><><><><><><><><><><><><><><><><><><><><> <><><><><><><><><><><><><><><><><><><><><><><><><><><><><><><><><> - Test results slightly outside the reference range are not unusual. If there is anything important, I will review this with you,  otherwise it is considered normal test values.  If you have further questions,  please do not hesitate to contact me at the office or via My Chart.  <><><><><><><><><><><><><><><><><><><><><><><><><><><><><><><><><> <><><><><><><><><><><><><><><><><><><><><><><><><><><><><><><><><>  -  Vitamin B12 level is extremely elevated ,                                                      So if taking daily, Suggest decrease back to 2 x /week  <><><><><><><><><><><><><><><><><><><><><><><><><><><><><><><><><>  -  Both total Cholesterol  & LDL Chol have gone up significantly,                                                                     So need to be on a stricter diet & lose weight            - Cholesterol is too high - Recommend low cholesterol diet   - Cholesterol only comes from animal sources  - ie. meat, dairy, egg yolks  - Eat all the vegetables you want.  - Avoid Meat, Avoid Meat,  Avoid Meat - especially Red Meat - Beef AND Pork .  - Avoid cheese & dairy - milk & ice cream.     - Cheese is the most concentrated form of trans-fats which  is the worst thing to clog up our arteries.   - Veggie cheese is OK which can be found in the fresh  produce section at [employer] or [employer] or [employer] <><><><><><><><><><><><><><><><><><><><><><><><><><><><><><><><><> <><><><><><><><><><><><><><><><><><><><><><><><><><><><><><><><><>  -    TSH has gone up a little which implies your thyroid hormone is to low   But if you are taking Biotin supplement for Hair , it can cause an  incorrect dose     - I must be 295% ABSOLUTELY  certain that you are not taking a Biotin supplement     Before I can make recommendations  what to do !   - usual amount of Biotin iin a Multivitamin is about 30 mcg ( & is OK) , but  Unfortunately it's frequently sold OTC alone or in Skin, hair, nail products in    amounts of 1,000 mcg - 2,500 mcg to 5,000 mcg &I've even seen it at 10,000 mcg   - The problem being is that Biotin causes the lab analyzer machines   that measure the thyroid hormone (&other blood measurements) to   yield incorrect results &if the Medical provider is unaware of this,   then incorrect changes in medicines ( thyroid &other ) are recommended based on   incorrect results &there have been reports of Death due to patients taking Biotin.   But since it's not a regulated substance, the FDA does nothing !  - Taking Biotin will make it look like the Thyroid is too low in the blood &then   If the Doctor prescribes thyroid, then there would be too much in the blood &  could cause a heart arrhythmia or severe  hypertension leading                                                                                         to a stroke or heart attack  <><><><><><><><><><><><><><><><><><><><><><><><><><><><><><><><><> <><><><><><><><><><><><><><><><><><><><><><><><><><><><><><><><><>  - Vitamin D= 29  - is extremely LOW !   - If you are taking 5,000 unit capsule / Daily,                       Then please increase to 2 capsules 5,000 u = 10,000 units EVERY day  <><><><><><><><><><><><><><><><><><><><><><><><><><><><><><><><><>  -  Iron levels are a little low    -  Recommend take an OTC Iron tablet Daily   - Also   - Eat m ore Veggies with Iron as                                 Carrots, Beets , all leafy green veggies as Spinach, Collards,                                     Turnip - Mustard or Mixed Greens, Kale, Asparagus, Broccoli,                                    Brussel Sprouts, Green Beans / peas,  &                                                                              Soybeans, Lentils, Sweet Potatoes <><><><><><><><><><><><><><><><><><><><><><><><><><><><><><><><><> <><><><><><><><><><><><><><><><><><><><><><><><><><><><><><><><><>  -  Magnesium = 1.8  is very ,  very  low- goal is betw 2.0 - 2.5,   - So..............Marland Kitchen  Recommend that you INCREASE your                                                                 Magnesium 500 mg tablet to 3 x /day with Meals   - also important to eat lots of  leafy green vegetables   - spinach - Kale - collards - greens - okra - asparagus   - broccoli - quinoa - squash - almonds   - black, red, white beans -  peas - green beans <><><><><><><><><><><><><><><><><><><><><><><><><><><><><><><><><> <><><><><><><><><><><><><><><><><><><><><><><><><><><><><><><><><>  - A1c - is Normal - No Diabetes  - Great ! <><><><><><><><><><><><><><><><><><><><><><><><><><><><><><><><><>  -  Zinc   Level is Normal & OK  <><><><><><><><><><><><><><><><><><><><><><><><><><><><><><><><><>  -  All Else - CBC - Kidneys -  Electrolytes - Liver - Magnesium & Thyroid    - all  Normal / OK <><><><><><><><><><><><><><><><><><><><><><><><><><><><><><><><><> <><><><><><><><><><><><><><><><><><><><><><><><><><><><><><><><><> -

## 2022-08-24 ENCOUNTER — Encounter: Payer: Self-pay | Admitting: Internal Medicine

## 2022-08-25 ENCOUNTER — Encounter: Payer: Self-pay | Admitting: Internal Medicine

## 2022-08-25 LAB — IRON, TOTAL/TOTAL IRON BINDING CAP
%SAT: 14 % (calc) — ABNORMAL LOW (ref 16–45)
Iron: 48 ug/dL (ref 45–160)
TIBC: 345 mcg/dL (calc) (ref 250–450)

## 2022-08-25 LAB — CBC WITH DIFFERENTIAL/PLATELET
Absolute Monocytes: 400 cells/uL (ref 200–950)
Basophils Absolute: 52 cells/uL (ref 0–200)
Basophils Relative: 0.9 %
Eosinophils Absolute: 122 cells/uL (ref 15–500)
Eosinophils Relative: 2.1 %
HCT: 37.3 % (ref 35.0–45.0)
Hemoglobin: 12.8 g/dL (ref 11.7–15.5)
Lymphs Abs: 1618 cells/uL (ref 850–3900)
MCH: 30.8 pg (ref 27.0–33.0)
MCHC: 34.3 g/dL (ref 32.0–36.0)
MCV: 89.9 fL (ref 80.0–100.0)
MPV: 8.7 fL (ref 7.5–12.5)
Monocytes Relative: 6.9 %
Neutro Abs: 3608 cells/uL (ref 1500–7800)
Neutrophils Relative %: 62.2 %
Platelets: 364 10*3/uL (ref 140–400)
RBC: 4.15 10*6/uL (ref 3.80–5.10)
RDW: 13 % (ref 11.0–15.0)
Total Lymphocyte: 27.9 %
WBC: 5.8 10*3/uL (ref 3.8–10.8)

## 2022-08-25 LAB — LIPID PANEL
Cholesterol: 212 mg/dL — ABNORMAL HIGH (ref <200)
HDL: 79 mg/dL (ref >=50)
LDL Cholesterol (Calc): 111 mg/dL (calc) — ABNORMAL HIGH
Non-HDL Cholesterol (Calc): 133 mg/dL (calc) — ABNORMAL HIGH (ref <130)
Total CHOL/HDL Ratio: 2.7 (calc) (ref <5.0)
Triglycerides: 109 mg/dL (ref <150)

## 2022-08-25 LAB — COMPLETE METABOLIC PANEL WITH GFR
AG Ratio: 1.8 (calc) (ref 1.0–2.5)
ALT: 11 U/L (ref 6–29)
AST: 17 U/L (ref 10–35)
Albumin: 4.4 g/dL (ref 3.6–5.1)
Alkaline phosphatase (APISO): 52 U/L (ref 37–153)
BUN: 17 mg/dL (ref 7–25)
CO2: 26 mmol/L (ref 20–32)
Calcium: 9.4 mg/dL (ref 8.6–10.4)
Chloride: 103 mmol/L (ref 98–110)
Creat: 0.81 mg/dL (ref 0.60–1.00)
Globulin: 2.5 g/dL (calc) (ref 1.9–3.7)
Glucose, Bld: 85 mg/dL (ref 65–99)
Potassium: 4.1 mmol/L (ref 3.5–5.3)
Sodium: 140 mmol/L (ref 135–146)
Total Bilirubin: 0.4 mg/dL (ref 0.2–1.2)
Total Protein: 6.9 g/dL (ref 6.1–8.1)
eGFR: 78 mL/min/{1.73_m2} (ref >=60)

## 2022-08-25 LAB — VITAMIN B12: Vitamin B-12: >2000 pg/mL — ABNORMAL HIGH (ref 200–1100)

## 2022-08-25 LAB — INSULIN, RANDOM: Insulin: 4.9 u[IU]/mL

## 2022-08-25 LAB — MAGNESIUM: Magnesium: 1.8 mg/dL (ref 1.5–2.5)

## 2022-08-25 LAB — FERRITIN: Ferritin: 16 ng/mL (ref 16–288)

## 2022-08-25 LAB — ZINC: Zinc: 65 ug/dL (ref 60–130)

## 2022-08-25 LAB — METHYLMALONIC ACID, SERUM: Methylmalonic Acid, Quant: 117 nmol/L (ref 87–318)

## 2022-08-25 LAB — TSH: TSH: 8.47 mIU/L — ABNORMAL HIGH (ref 0.40–4.50)

## 2022-08-25 LAB — HEMOGLOBIN A1C
Hgb A1c MFr Bld: 5.6 % of total Hgb (ref <5.7)
Mean Plasma Glucose: 114 mg/dL
eAG (mmol/L): 6.3 mmol/L

## 2022-08-25 LAB — VITAMIN D 25 HYDROXY (VIT D DEFICIENCY, FRACTURES): Vit D, 25-Hydroxy: 29 ng/mL — ABNORMAL LOW (ref 30–100)

## 2022-08-26 ENCOUNTER — Ambulatory Visit: Payer: Medicare PPO | Admitting: Internal Medicine

## 2022-08-31 ENCOUNTER — Encounter: Payer: Self-pay | Admitting: Internal Medicine

## 2022-08-31 DIAGNOSIS — Z135 Encounter for screening for eye and ear disorders: Secondary | ICD-10-CM | POA: Diagnosis not present

## 2022-08-31 DIAGNOSIS — H524 Presbyopia: Secondary | ICD-10-CM | POA: Diagnosis not present

## 2022-08-31 DIAGNOSIS — H04123 Dry eye syndrome of bilateral lacrimal glands: Secondary | ICD-10-CM | POA: Diagnosis not present

## 2022-08-31 DIAGNOSIS — Z961 Presence of intraocular lens: Secondary | ICD-10-CM | POA: Diagnosis not present

## 2022-08-31 DIAGNOSIS — H5203 Hypermetropia, bilateral: Secondary | ICD-10-CM | POA: Diagnosis not present

## 2022-08-31 DIAGNOSIS — H52223 Regular astigmatism, bilateral: Secondary | ICD-10-CM | POA: Diagnosis not present

## 2022-09-08 ENCOUNTER — Other Ambulatory Visit: Payer: Self-pay

## 2022-09-08 DIAGNOSIS — G8929 Other chronic pain: Secondary | ICD-10-CM

## 2022-09-08 DIAGNOSIS — G2581 Restless legs syndrome: Secondary | ICD-10-CM

## 2022-09-08 MED ORDER — ROPINIROLE HCL 0.5 MG PO TABS: ORAL_TABLET | ORAL | 0 refills | Status: DC

## 2022-09-10 ENCOUNTER — Other Ambulatory Visit: Payer: Self-pay | Admitting: Internal Medicine

## 2022-09-10 MED ORDER — PSEUDOEPHEDRINE HCL ER 120 MG PO TB12: ORAL_TABLET | ORAL | 3 refills | Status: DC

## 2022-09-10 MED ORDER — DEXAMETHASONE 4 MG PO TABS: ORAL_TABLET | ORAL | 0 refills | Status: DC

## 2022-09-10 MED ORDER — BENZONATATE 200 MG PO CAPS: ORAL_CAPSULE | ORAL | 1 refills | Status: DC

## 2022-09-15 DIAGNOSIS — Z1231 Encounter for screening mammogram for malignant neoplasm of breast: Secondary | ICD-10-CM | POA: Diagnosis not present

## 2022-09-15 LAB — HM MAMMOGRAPHY

## 2022-09-23 ENCOUNTER — Ambulatory Visit: Payer: Medicare PPO | Admitting: Internal Medicine

## 2022-09-29 DIAGNOSIS — M47816 Spondylosis without myelopathy or radiculopathy, lumbar region: Secondary | ICD-10-CM | POA: Diagnosis not present

## 2022-09-29 DIAGNOSIS — Z981 Arthrodesis status: Secondary | ICD-10-CM | POA: Diagnosis not present

## 2022-09-29 DIAGNOSIS — M5416 Radiculopathy, lumbar region: Secondary | ICD-10-CM | POA: Diagnosis not present

## 2022-10-07 ENCOUNTER — Other Ambulatory Visit: Payer: Self-pay | Admitting: Internal Medicine

## 2022-10-07 DIAGNOSIS — G2581 Restless legs syndrome: Secondary | ICD-10-CM

## 2022-10-07 DIAGNOSIS — G8929 Other chronic pain: Secondary | ICD-10-CM

## 2022-10-07 MED ORDER — ROPINIROLE HCL 0.5 MG PO TABS: ORAL_TABLET | ORAL | 3 refills | Status: DC

## 2022-10-11 ENCOUNTER — Encounter: Payer: Self-pay | Admitting: Internal Medicine

## 2022-10-16 ENCOUNTER — Other Ambulatory Visit: Payer: Self-pay | Admitting: Nurse Practitioner

## 2022-10-16 DIAGNOSIS — K2100 Gastro-esophageal reflux disease with esophagitis, without bleeding: Secondary | ICD-10-CM

## 2022-11-22 NOTE — Progress Notes (Unsigned)
AWV and follow up  Assessment:      Annual Medicare Wellness Visit Due annually  Health maintenance reviewed   Aortic atherosclerosis ([place]) CT 09/25/19 Control blood pressure, lipids and glucose Disscused lifestyle modifications, diet & exercise Continue to monitor  Labile hypertension Controlled with llifestlye Monitor blood pressure at home; call if consistently over 130/80 Continue DASH diet.   Reminder to go to the ER if any CP, SOB, nausea, dizziness, severe HA, changes vision/speech, left arm numbness and tingling and jaw pain.  Hyperlipidemia, mixed Not currently on agent; discussed risks with atherosclerosis; she is receptive to starting rosuvastatin low dose low frequency for LDL goal <70 Monitor blood pressure at home; call if consistently over 130/80 Continue DASH diet.   Reminder to go to the ER if any CP, SOB, nausea, dizziness, severe HA, changes vision/speech, left arm numbness and tingling and jaw pain. - Lipid panel - CMP - CBC  Gastroesophageal reflux disease with esophagitis without hemorrhage/ Possible hiatal hernia Medications and diet have not helped Referral to GI  Vitamin D deficiency Continue supplementation  Check level annually and PRN dose change  S/P lumbar fusion Chronic pain syndrome She is no longer following ortho/neurosurgery Declined pain med referral  Continue topical/oral NSAIDs, poorly tolerated cymbalta Patient preference to retry gabapentin - sent in   Abnormal glucose Discussed dietary and exercise modifications - check A1C annually  Hypothyroidism continue medications the same pending lab results reminded to take on an empty stomach 30-42mns before food.  check TSH level  Anxiety Well managed by current regimen; continue medications  Stress management techniques discussed, increase water, good sleep hygiene discussed, increase exercise, and increase veggies.   Osteopenia of left hip - is due for repeat DEXA,  scheduled 09/21/23  continue Vit D and high Ca diet, weight bearing exercises encouraged at least every other day   Restless legs Continue walking Currently on ropinirole 0.5 mg 1/2-1 tab TID  B12 deficiency Taking 1,0080m daily recommended to reduce by half Defer level today   BMI 25.0-25.9 Discussed dietary and exercise modifications  Medication management - CBC, CMP  Stress Incontinence Continue Gemtesa Continue to follow with Monique Monique Senderurogynecology         Further disposition pending results if labs check today. Discussed med's effects and SE's.   Over 40 minutes of face to face interview, exam, counseling, chart review, and critical decision making was performed.    Future Appointments  Date Time Provider [place]6/07/2023 10:30 AM Monique PintoMD [facility] None  05/31/2023 10:00 AM WiAlycia RossettiNP [facility] None  07/27/2023 10:00 AM [facility]1/02/2024 11:00 AM WiAlycia RossettiNP [facility] None      Subjective: HPI  BrBethanne Thomas is a [age].o. female who presents for AWV and follow up. She has Back pain, chronic; Hyperlipidemia; Hypothyroidism; GERD (gastroesophageal reflux disease); Anxiety; Vitamin D deficiency; Medication management; Other abnormal glucose; Chronic pain syndrome; Restless legs; Menopausal symptoms; Osteopenia; [place]; Diverticulosis; and TMJ tenderness, bilateral on their problem list.  She got married in 2019, has a grown daughter, son was murdered. She does had 2 bio grandkids. She is retired from [employer]al [employer]  [facility], on estrace 1 mg via GYN. Hx of bladder sling in 2004, has done pelvic PT. She had bladder sling 12/15/21 with urogyn- the sling is not  adhering to one side- taking vaginal premarin. Followed now by Urogynecology Monique. Wannetta Thomas, last visit 07/26/22. She  had sling revision on 02/22/22. Currently doing well on Gemtesa 75 mg daily  She is having "frog in her throat" when she eats and lies flat.  Clearing throat does not make it better.  Heartburn persisting but not controlled with any previous meds. She recorded the noise and sounds like a bullfrog.   She has seen ortho/neurosurgeon for chronic back pain (arthritis, reports had MRI neg for impingement) persistent R leg and knee following replacement in 2019, has seen ortho, had injections, done PT without benefit. NSAIDs do help but trying to limit use, uses topicals. She as trying cymbalta 20 mg but reports poorly tolerated, would like to retry low dose gabapentin. Tylenol not helpful. She has tried TENS unit, interested in [place], can get with [facility], requests letter. She has appt Monique Monique Thomas at the end of this month  she has a diagnosis of anxiety and is currently on xanax 1 mg, reports symptoms are well controlled on current regimen. she currently takes 1/2 tab at night to help with sleep/restless legs, has been tapering down and reducing use.Marland Kitchen   BMI is Body mass index is 25.97 kg/m., she has been working on diet and exercise, walks to get 10000 steps by tracker daily.  Wt Readings from Last 3 Encounters:  11/23/22 135 lb 3.2 oz (61.3 kg)  08/22/22 135 lb 9.6 oz (61.5 kg)  07/21/22 133 lb (60.3 kg)   She does not have htn. Today their BP is BP: 126/62  BP Readings from Last 3 Encounters:  11/23/22 126/62  08/22/22 128/70  07/26/22 (!) 107/50  She does workout. She denies chest pain, shortness of breath, dizziness.   She has aortic atherosclerosis per CT 09/2019.   She is not on cholesterol medication and denies myalgias. Her cholesterol is at goal. The cholesterol last visit was:   Lab Results  Component Value Date   CHOL 212 (H) 08/22/2022   HDL 79 08/22/2022   LDLCALC 111 (H) 08/22/2022   TRIG 109 08/22/2022   CHOLHDL 2.7 08/22/2022    She has been working on diet and exercise for glucose  management, and denies foot ulcerations, increased appetite, nausea, paresthesia of the feet, polydipsia, polyuria, visual disturbances, vomiting and weight loss. Last A1C in the office was:  Lab Results  Component Value Date   HGBA1C 5.6 08/22/2022   She is on thyroid medication. Her medication was changed last visit, reduced from 88 mcg daily to 1/2 tab twice a week and whole tab other days.  Lab Results  Component Value Date   TSH 8.47 (H) 08/22/2022   Last GFR: Lab Results  Component Value Date   EGFR 78 08/22/2022    Patient is on Vitamin D supplement  Lab Results  Component Value Date   VD25OH 29 (L) 08/22/2022         Medication Review: Current Outpatient Medications on File Prior to Visit  Medication Sig Dispense Refill   Cholecalciferol (VITAMIN D) 125 MCG (5000 UT) CAPS Take by mouth.     estradiol (ESTRACE) 1 MG tablet Take 1 mg by mouth daily.     GEMTESA 75 MG TABS Take by mouth.     levothyroxine (SYNTHROID) 88 MCG tablet Take 1 tablet daily except 1/2 tab on Tues/Sat for thyroid on empty stomach with water only. 30 tablet 11   naproxen sodium (ALEVE) 220 MG tablet Take 2 tabs twice daily  with food as needed for back pain.     No current facility-administered medications on file prior to visit.    Allergies  Allergen Reactions   Celebrex [Celecoxib] Other (See Comments)    "panic attack"   Other Other (See Comments)    Skin bruises very easily and peels back   Topiramate Other (See Comments)    Agitation   Gabapentin     sedation    Lyrica [Pregabalin]     sedation   Zofran [Ondansetron] Other (See Comments)    Headache   Entex Lq [Phenylephrine-Guaifenesin] Rash   Fiorinal [Butalbital-Aspirin-Caffeine] Rash    Current Problems (verified) Patient Active Problem List   Diagnosis Date Noted   TMJ tenderness, bilateral 09/28/2021   Aortic atherosclerosis ([place]) by Abd CT Scan 09/25/2019 09/25/2019   Diverticulosis 09/25/2019   Osteopenia  02/26/2019   Menopausal symptoms 05/07/2018   Restless legs 02/06/2018   Chronic pain syndrome 03/27/2015   Medication management 01/15/2014   Other abnormal glucose 01/15/2014   Back pain, chronic    Hyperlipidemia    Hypothyroidism    GERD (gastroesophageal reflux disease)    Anxiety    Vitamin D deficiency     Screening Tests Immunization History  Administered Date(s) Administered   Influenza Split 06/29/2015   Influenza Whole 05/21/2013   Influenza, High Dose Seasonal PF 05/30/2016, 06/13/2017, 06/18/2018, 06/24/2019, 07/02/2020, 06/30/2021, 07/21/2022   Influenza,inj,quad, With Preservative 06/19/2017   Influenza-Unspecified 06/09/2014, 06/20/2015   Janssen (J&J) SARS-COV-2 Vaccination 02/19/2020   PPD Test 09/23/2013, 10/01/2014, 02/26/2016   Pneumococcal Conjugate-13 05/03/2017   Pneumococcal Polysaccharide-23 09/23/2013   Pneumococcal-Unspecified 06/19/2017   Td 09/19/2005   Tdap 10/07/2015   Zoster Recombinat (Shingrix) 12/03/2020, 02/19/2021   Zoster, Live 03/27/2014   Health Maintenance  Topic Date Due   COVID-19 Vaccine (2 - Janssen risk series) 12/09/2022 (Originally 03/18/2020)   MAMMOGRAM  09/16/2023   Medicare Annual Wellness (AWV)  11/23/2023   Fecal DNA (Cologuard)  08/02/2024   DTaP/Tdap/Td (3 - Td or Tdap) 10/06/2025   INFLUENZA VACCINE  Completed   DEXA SCAN  Completed   Hepatitis C Screening  Completed   Zoster Vaccines- Shingrix  Completed   HPV VACCINES  Aged Out   Pneumonia Vaccine 61+ Years old  Discontinued     Names of Other Physician/Practitioners you currently use: 1. [facility] here for primary care 2. Monique. Einar Gip, eye doctor, last visit 2023, s/p cataracts has scheduled next month 3. Monique. Toy Cookey, dentist, Q6 months, last visit 2023, Q40m Patient Care Team: MUnk Pinto MD as PCP - General (Internal Medicine) PDarlis Loan MD as Referring Physician (Gastroenterology) WVania Rea MD as  Consulting Physician (Obstetrics and Gynecology) TCarolan Clines MD (Inactive) as Consulting Physician (Urology) JZara Councilas Physician Assistant (Orthopedic Surgery) TLavonna Monarch MD (Inactive) as Consulting Physician (Dermatology) GGatha Mayer MD as Consulting Physician (Gastroenterology) MWebb Laws O[place]tonas Referring Physician (Optometry) SJaquita Folds MD as Consulting Physician (Obstetrics and Gynecology)  SURGICAL HISTORY She  has a past surgical history that includes Abdominal hysterectomy; Cesarean section; Hernia repair; Appendectomy; Tonsillectomy and adenoidectomy; Spine surgery; Laparoscopic lysis intestinal adhesions; Maximum access (mas)posterior lumbar interbody fusion (plif) 1 level (N/A, 04/16/2015); Anterior lat lumbar fusion (N/A, 04/07/2017); Total knee arthroplasty (Left, 07/17/2018); Extracorporeal shock wave lithotripsy (2015); Bladder suspension (2013); Bladder suspension (N/A, 12/09/2021); Cystoscopy (N/A, 12/09/2021); and Excision of mesh (N/A, 03/14/2022). FAMILY HISTORY Her family history includes Asthma in her sister; Cancer in her maternal grandmother;  Heart disease in her mother; Hyperlipidemia in her mother; Thyroid disease in her mother. SOCIAL HISTORY She  reports that she has never smoked. She has never used smokeless tobacco. She reports that she does not drink alcohol and does not use drugs.   MEDICARE WELLNESS OBJECTIVES: Physical activity: Current Exercise Habits: The patient does not participate in regular exercise at present, Exercise limited by: orthopedic condition(s) Cardiac risk factors: Cardiac Risk Factors include: advanced age (>64mn, >>26women);dyslipidemia;sedentary lifestyle Depression/mood screen:      11/23/2022    4:16 PM  Depression screen PHQ 2/9  Decreased Interest 0  Down, Depressed, Hopeless 0  PHQ - 2 Score 0    ADLs:     11/23/2022    4:16 PM 03/14/2022   12:58 PM  In your present state of health,  do you have any difficulty performing the following activities:  Hearing? 0 0  Vision? 0 0  Difficulty concentrating or making decisions? 0 0  Walking or climbing stairs? 1 1  Dressing or bathing? 0 0  Doing errands, shopping? 0      Cognitive Testing  Alert? Yes  Normal Appearance?Yes  Oriented to person? Yes  Place? Yes   Time? Yes  Recall of three objects?  Yes  Can perform simple calculations? Yes  Displays appropriate judgment?Yes  Can read the correct time from a watch face?Yes  EOL planning: Does Patient Have a Medical Advance Directive?: No Would patient like information on creating a medical advance directive?: No - Patient declined     Review of Systems  Constitutional:  Negative for malaise/fatigue and weight loss.  HENT:  Negative for hearing loss and tinnitus.   Eyes:  Negative for blurred vision and double vision.  Respiratory:  Negative for cough, sputum production, shortness of breath and wheezing.   Cardiovascular:  Negative for chest pain, palpitations, orthopnea, claudication, leg swelling and PND.  Gastrointestinal:  Positive for heartburn. Negative for abdominal pain, blood in stool, constipation, diarrhea, melena, nausea and vomiting.       Noise in chest intermittently- possible hiatal hernia  Genitourinary:  Positive for frequency. Negative for dysuria, hematuria and urgency.       Stress incontinences,improved with second bladder sling and gemtesa  Musculoskeletal:  Positive for back pain (chronic lumbar) and joint pain (L knee following replacement, has seen ortho). Negative for falls and myalgias.  Skin:  Negative for rash.  Neurological:  Negative for dizziness, tingling, sensory change, weakness and headaches.  Endo/Heme/Allergies:  Negative for polydipsia.  Psychiatric/Behavioral:  Negative for depression, memory loss, substance abuse and suicidal ideas. The patient has insomnia. The patient is not nervous/anxious.   All other systems reviewed and  are negative.    Objective:     Today's Vitals   11/23/22 1541  BP: 126/62  Pulse: 74  Temp: 98.2 F (36.8 C)  SpO2: 97%  Weight: 135 lb 3.2 oz (61.3 kg)  Height: 5' 0.5" (1.537 m)   Body mass index is 25.97 kg/m.  General appearance: alert, no distress, WD/WN, female HEENT: normocephalic, sclerae anicteric, TMs pearly, nares patent, no discharge or erythema, pharynx normal Oral cavity: MMM, no lesions Neck: supple, no lymphadenopathy, no thyromegaly, no masses Heart: RRR, normal S1, S2, no murmurs Lungs: CTA bilaterally, no wheezes, rhonchi, or rales Abdomen: +bs, soft, non tender, non distended, no masses, no hepatomegaly, no splenomegaly Musculoskeletal: nontender, no swelling, no obvious deformity Extremities: no edema, no cyanosis, no clubbing Pulses: 2+ symmetric, upper and lower extremities, normal cap  refill Neurological: alert, oriented x 3, CN2-12 intact, strength normal upper extremities and lower extremities, sensation normal throughout, DTRs 2+ throughout, no cerebellar signs, gait normal Psychiatric: normal affect, behavior normal, pleasant    Medicare Attestation I have personally reviewed: The patient's medical and social history Their use of alcohol, tobacco or illicit drugs Their current medications and supplements The patient's functional ability including ADLs,fall risks, home safety risks, cognitive, and hearing and visual impairment Diet and physical activities Evidence for depression or mood disorders  The patient's weight, height, BMI, and visual acuity have been recorded in the chart.  I have made referrals, counseling, and provided education to the patient based on review of the above and I have provided the patient with a written personalized care plan for preventive services.     Monique Rossetti, NP 4:20 PM [facility]

## 2022-11-23 ENCOUNTER — Encounter: Payer: Self-pay | Admitting: Nurse Practitioner

## 2022-11-23 ENCOUNTER — Ambulatory Visit: Payer: Medicare PPO | Admitting: Nurse Practitioner

## 2022-11-23 VITALS — BP 126/62 | HR 74 | Temp 98.2°F | Ht 60.5 in | Wt 135.2 lb

## 2022-11-23 DIAGNOSIS — Z79899 Other long term (current) drug therapy: Secondary | ICD-10-CM

## 2022-11-23 DIAGNOSIS — F419 Anxiety disorder, unspecified: Secondary | ICD-10-CM | POA: Diagnosis not present

## 2022-11-23 DIAGNOSIS — E039 Hypothyroidism, unspecified: Secondary | ICD-10-CM

## 2022-11-23 DIAGNOSIS — R7309 Other abnormal glucose: Secondary | ICD-10-CM | POA: Diagnosis not present

## 2022-11-23 DIAGNOSIS — E782 Mixed hyperlipidemia: Secondary | ICD-10-CM | POA: Diagnosis not present

## 2022-11-23 DIAGNOSIS — R0989 Other specified symptoms and signs involving the circulatory and respiratory systems: Secondary | ICD-10-CM | POA: Diagnosis not present

## 2022-11-23 DIAGNOSIS — Z0000 Encounter for general adult medical examination without abnormal findings: Secondary | ICD-10-CM

## 2022-11-23 DIAGNOSIS — K449 Diaphragmatic hernia without obstruction or gangrene: Secondary | ICD-10-CM

## 2022-11-23 DIAGNOSIS — I700 Atherosclerosis of aorta: Secondary | ICD-10-CM

## 2022-11-23 DIAGNOSIS — E663 Overweight: Secondary | ICD-10-CM

## 2022-11-23 DIAGNOSIS — G2581 Restless legs syndrome: Secondary | ICD-10-CM

## 2022-11-23 DIAGNOSIS — M85852 Other specified disorders of bone density and structure, left thigh: Secondary | ICD-10-CM

## 2022-11-23 DIAGNOSIS — E538 Deficiency of other specified B group vitamins: Secondary | ICD-10-CM

## 2022-11-23 DIAGNOSIS — E559 Vitamin D deficiency, unspecified: Secondary | ICD-10-CM | POA: Diagnosis not present

## 2022-11-23 DIAGNOSIS — N393 Stress incontinence (female) (male): Secondary | ICD-10-CM

## 2022-11-23 DIAGNOSIS — K219 Gastro-esophageal reflux disease without esophagitis: Secondary | ICD-10-CM

## 2022-11-23 MED ORDER — ALPRAZOLAM 1 MG PO TABS: ORAL_TABLET | ORAL | 0 refills | Status: DC

## 2022-11-23 NOTE — Patient Instructions (Signed)

## 2022-11-24 LAB — CBC WITH DIFFERENTIAL/PLATELET
Absolute Monocytes: 350 cells/uL (ref 200–950)
Basophils Absolute: 41 cells/uL (ref 0–200)
Basophils Relative: 0.9 %
Eosinophils Absolute: 202 cells/uL (ref 15–500)
Eosinophils Relative: 4.4 %
HCT: 37 % (ref 35.0–45.0)
Hemoglobin: 12.5 g/dL (ref 11.7–15.5)
Lymphs Abs: 1789 cells/uL (ref 850–3900)
MCH: 30 pg (ref 27.0–33.0)
MCHC: 33.8 g/dL (ref 32.0–36.0)
MCV: 88.9 fL (ref 80.0–100.0)
MPV: 8.8 fL (ref 7.5–12.5)
Monocytes Relative: 7.6 %
Neutro Abs: 2217 cells/uL (ref 1500–7800)
Neutrophils Relative %: 48.2 %
Platelets: 373 10*3/uL (ref 140–400)
RBC: 4.16 10*6/uL (ref 3.80–5.10)
RDW: 12.3 % (ref 11.0–15.0)
Total Lymphocyte: 38.9 %
WBC: 4.6 10*3/uL (ref 3.8–10.8)

## 2022-11-24 LAB — COMPLETE METABOLIC PANEL WITH GFR
AG Ratio: 2 (calc) (ref 1.0–2.5)
ALT: 12 U/L (ref 6–29)
AST: 18 U/L (ref 10–35)
Albumin: 4.5 g/dL (ref 3.6–5.1)
Alkaline phosphatase (APISO): 55 U/L (ref 37–153)
BUN/Creatinine Ratio: 25 (calc) — ABNORMAL HIGH (ref 6–22)
BUN: 15 mg/dL (ref 7–25)
CO2: 26 mmol/L (ref 20–32)
Calcium: 9.3 mg/dL (ref 8.6–10.4)
Chloride: 106 mmol/L (ref 98–110)
Creat: 0.59 mg/dL — ABNORMAL LOW (ref 0.60–1.00)
Globulin: 2.3 g/dL (calc) (ref 1.9–3.7)
Glucose, Bld: 85 mg/dL (ref 65–99)
Potassium: 3.9 mmol/L (ref 3.5–5.3)
Sodium: 141 mmol/L (ref 135–146)
Total Bilirubin: 0.4 mg/dL (ref 0.2–1.2)
Total Protein: 6.8 g/dL (ref 6.1–8.1)
eGFR: 96 mL/min/{1.73_m2} (ref >=60)

## 2022-11-24 LAB — LIPID PANEL
Cholesterol: 203 mg/dL — ABNORMAL HIGH (ref <200)
HDL: 76 mg/dL (ref >=50)
LDL Cholesterol (Calc): 105 mg/dL (calc) — ABNORMAL HIGH
Non-HDL Cholesterol (Calc): 127 mg/dL (calc) (ref <130)
Total CHOL/HDL Ratio: 2.7 (calc) (ref <5.0)
Triglycerides: 123 mg/dL (ref <150)

## 2022-11-24 LAB — TSH: TSH: 2.28 mIU/L (ref 0.40–4.50)

## 2022-11-29 DIAGNOSIS — M5416 Radiculopathy, lumbar region: Secondary | ICD-10-CM | POA: Diagnosis not present

## 2022-11-29 DIAGNOSIS — Z6825 Body mass index (BMI) 25.0-25.9, adult: Secondary | ICD-10-CM | POA: Diagnosis not present

## 2022-12-01 ENCOUNTER — Other Ambulatory Visit: Payer: Self-pay | Admitting: Neurological Surgery

## 2022-12-01 DIAGNOSIS — M5416 Radiculopathy, lumbar region: Secondary | ICD-10-CM

## 2022-12-05 ENCOUNTER — Encounter: Payer: Self-pay | Admitting: Obstetrics and Gynecology

## 2022-12-06 ENCOUNTER — Ambulatory Visit: Payer: Medicare PPO | Admitting: Nurse Practitioner

## 2022-12-06 ENCOUNTER — Ambulatory Visit (INDEPENDENT_AMBULATORY_CARE_PROVIDER_SITE_OTHER): Payer: Medicare PPO

## 2022-12-06 ENCOUNTER — Other Ambulatory Visit (HOSPITAL_COMMUNITY)
Admission: RE | Admit: 2022-12-06 | Discharge: 2022-12-06 | Disposition: A | Discharge status: Home / Self Care | Payer: Medicare PPO | Source: Ambulatory Visit | Attending: Obstetrics and Gynecology | Admitting: Obstetrics and Gynecology

## 2022-12-06 DIAGNOSIS — R82998 Other abnormal findings in urine: Secondary | ICD-10-CM | POA: Insufficient documentation

## 2022-12-06 DIAGNOSIS — R350 Frequency of micturition: Secondary | ICD-10-CM

## 2022-12-06 LAB — POCT URINALYSIS DIPSTICK
Bilirubin, UA: NEGATIVE
Blood, UA: NEGATIVE
Glucose, UA: NEGATIVE
Ketones, UA: NEGATIVE
Nitrite, UA: NEGATIVE
Protein, UA: NEGATIVE
Spec Grav, UA: 1.015 (ref 1.010–1.025)
Urobilinogen, UA: 0.2 E.U./dL
pH, UA: 6.5 (ref 5.0–8.0)

## 2022-12-06 MED ORDER — SULFAMETHOXAZOLE-TRIMETHOPRIM 800-160 MG PO TABS: 1.0000 | ORAL_TABLET | Freq: Two times a day (BID) | ORAL | 0 refills | Status: DC

## 2022-12-06 NOTE — Patient Instructions (Signed)
Your Urine dip that was done in office was Positive. I am sending the urine off for culture and you can take AZO over the counter for your discomfort.  We have also ordered Bactrim for you to take while we wait for your culture results, hopefully this gives you some relief. We will contact you when the results are back between 3-5 days. If a different antibiotic is needed we will sent the order to the pharmacy and you will be notified. If you have any questions or concerns please feel free to call us at [phone]   

## 2022-12-06 NOTE — Progress Notes (Signed)
Monique Thomas is a 72 y.o. female arrived today with UTI sx.  Per Dr. Tommas Olp protocol: A urine specimen was collected and POCT Urine was done and urine culture sent to the lab. POCT Urine was positive for leukocytes  Pt was notified and prescription sent to the preferred pharmacy.

## 2022-12-09 LAB — URINE CULTURE: Culture: 50000 — AB

## 2022-12-27 ENCOUNTER — Ambulatory Visit
Admission: RE | Admit: 2022-12-27 | Discharge: 2022-12-27 | Disposition: A | Discharge status: Home / Self Care | Payer: Medicare PPO | Source: Ambulatory Visit | Attending: Neurological Surgery | Admitting: Neurological Surgery

## 2022-12-27 DIAGNOSIS — M545 Low back pain, unspecified: Secondary | ICD-10-CM | POA: Diagnosis not present

## 2022-12-27 DIAGNOSIS — M5416 Radiculopathy, lumbar region: Secondary | ICD-10-CM

## 2022-12-28 ENCOUNTER — Ambulatory Visit: Payer: Medicare PPO | Admitting: Nurse Practitioner

## 2022-12-28 ENCOUNTER — Encounter: Payer: Self-pay | Admitting: Nurse Practitioner

## 2022-12-28 VITALS — BP 130/80 | HR 76 | Temp 97.8°F | Ht 60.5 in | Wt 134.8 lb

## 2022-12-28 DIAGNOSIS — J302 Other seasonal allergic rhinitis: Secondary | ICD-10-CM | POA: Diagnosis not present

## 2022-12-28 DIAGNOSIS — R051 Acute cough: Secondary | ICD-10-CM

## 2022-12-28 DIAGNOSIS — J392 Other diseases of pharynx: Secondary | ICD-10-CM

## 2022-12-28 MED ORDER — PROMETHAZINE-DM 6.25-15 MG/5ML PO SYRP: 5.0000 mL | ORAL_SOLUTION | Freq: Four times a day (QID) | ORAL | 0 refills | Status: DC | PRN

## 2022-12-28 MED ORDER — PREDNISONE 10 MG PO TABS: ORAL_TABLET | ORAL | 0 refills | Status: DC

## 2022-12-28 NOTE — Patient Instructions (Signed)
Cough, Adult Coughing is a reflex that clears your throat and airways (respiratory system). It helps heal and protect your lungs. It is normal to cough from time to time. A cough that happens with other symptoms or that lasts a long time may be a sign of a condition that needs treatment. A short-term (acute) cough may only last 2-3 weeks. A long-term (chronic) cough may last 8 or more weeks. Coughing is often caused by: Diseases, such as: An infection of the respiratory system. Asthma or other heart or lung diseases. Gastroesophageal reflux. This is when acid comes back up from the stomach. Breathing in things that irritate your lungs. Allergies. Postnasal drip. This is when mucus runs down the back of your throat. Smoking. Some medicines. Follow these instructions at home: Medicines Take over-the-counter and prescription medicines only as told by your health care provider. Talk with your provider before you take cough medicine (cough suppressants). Eating and drinking Do not drink alcohol. Avoid caffeine. Drink enough fluid to keep your pee (urine) pale yellow. Lifestyle Avoid cigarette smoke. Do not use any products that contain nicotine or tobacco. These products include cigarettes, chewing tobacco, and vaping devices, such as e-cigarettes. If you need help quitting, ask your provider. Avoid things that make you cough. These may include perfumes, candles, cleaning products, or campfire smoke. General instructions  Watch for any changes to your cough. Tell your provider about them. Always cover your mouth when you cough. If the air is dry in your bedroom or home, use a cool mist vaporizer or humidifier. If your cough is worse at night, try to sleep in a semi-upright position. Rest as needed. Contact a health care provider if: You have new symptoms, or your symptoms get worse. You cough up pus. You have a fever that does not go away or a cough that does not get better after 2-3  weeks. You cannot control your cough with medicine, and you are losing sleep. You have pain that gets worse or is not helped with medicine. You lose weight for no clear reason. You have night sweats. Get help right away if: You cough up blood. You have trouble breathing. Your heart is beating very fast. These symptoms may be an emergency. Get help right away. Call 911. Do not wait to see if the symptoms will go away. Do not drive yourself to the hospital. This information is not intended to replace advice given to you by your health care provider. Make sure you discuss any questions you have with your health care provider. Document Revised: 05/06/2022 Document Reviewed: 05/06/2022 Elsevier Patient Education  2023 [employer]  

## 2022-12-28 NOTE — Progress Notes (Signed)
Assessment and Plan:  Monique Thomas was seen today for an episodic visit.  Diagnoses and all order for this visit:  Acute cough Stay well hydrated to keep any mucus thin an d productive. Coughing can be cuased by several factors including   breathing in things that bother (irritate) your lungs.  Allergies.  Asthma.  Mucus that runs down the back of your throat (postnasal drip).  Smoking/smoke.  Acid backing up from the stomach into the tube that moves food from the mouth to the stomach (gastroesophageal reflux). A cough can linger for 3 weeks. Watch for any changes in your cough and contact office if noticed including blood, pus, pain, night sweats. Cover your mouth when you cough. If the air is dry, use a cool mist vaporizer or humidifier in your home. If your cough is worse at night, try using extra pillows to raise your head up higher while you sleep. Call 911 or report to ER if you start to have difficulty breathing.   - promethazine-dextromethorphan (PROMETHAZINE-DM) 6.25-15 MG/5ML syrup; Take 5 mLs by mouth 4 (four) times daily as needed for cough.  Dispense: 240 mL; Refill: 0 - predniSONE (DELTASONE) 10 MG tablet; 1 tab 3 x day for 2 days, then 1 tab 2 x day for 2 days, then 1 tab 1 x day for 3 days  Dispense: 13 tablet; Refill: 0  Throat dryness Warm salt water gargles, sips of warm liquids, throat lozenges.   - predniSONE (DELTASONE) 10 MG tablet; 1 tab 3 x day for 2 days, then 1 tab 2 x day for 2 days, then 1 tab 1 x day for 3 days  Dispense: 13 tablet; Refill: 0  Seasonal allergies Continue Zyrtec Avoid triggers  Notify office for further evaluation and treatment, questions or concerns if s/s fail to improve. The risks and benefits of my recommendations, as well as other treatment options were discussed with the patient today. Questions were answered.  Further disposition pending results of labs. Discussed med's effects and SE's.    Over 15 minutes of exam, counseling,  chart review, and critical decision making was performed.   Future Appointments  Date Time Provider Department Center  01/09/2023 11:30 AM Javier Glazier [facility]  02/28/2023 10:30 AM Lucky Cowboy, MD [facility] None  05/31/2023 10:00 AM Raynelle Dick, NP [facility] None  07/27/2023 10:00 AM Selmer Dominion, NP [facility]  09/25/2023 11:00 AM Raynelle Dick, NP [facility] None    ------------------------------------------------------------------------------------------------------------------   HPI BP 130/80   Pulse 76   Temp 97.8 F (36.6 C)   Ht 5' 0.5" (1.537 m)   Wt 134 lb 12.8 oz (61.1 kg)   SpO2 98%   BMI 25.89 kg/m    Patient complains of nonproductive cough with PND. Symptoms began 3 weeks ago. Symptoms have been unchanged since that time.The cough is barky and dry . Associated symptoms include:  runny nose . Patient does not have new pets. Patient does not have a history of asthma. Patient does have a history of environmental allergens and has been working daily in the garden.  Taking Zyrtec with some releif. Patient has not traveled recently. Patient does not have a history of smoking. Denies fever, chills, N/V, SOB, DOE  Past Medical History:  Diagnosis Date   Anxiety    Arthritis    Back pain, chronic    Depression    GERD (gastroesophageal reflux disease)    History of kidney stones 2015  Hyperlipidemia    Hypothyroidism    Skin abnormality 12/07/2021   face red with 2 blisters intact on forehead due to tolak crean tx for skin cancer   Skin Cancer (HCC)    SUI (stress urinary incontinence, female)    wears pads   Thyroid disease    TMJ (temporomandibular joint disorder)    joint has dislocated x 3 in past     Allergies  Allergen Reactions   Celebrex [Celecoxib] Other (See Comments)    "panic attack"   Other Other (See Comments)    Skin bruises very easily and peels back   Topiramate Other (See Comments)    Agitation    Gabapentin     sedation    Lyrica [Pregabalin]     sedation   Zofran [Ondansetron] Other (See Comments)    Headache   Entex Lq [Phenylephrine-Guaifenesin] Rash   Fiorinal [Butalbital-Aspirin-Caffeine] Rash    Current Outpatient Medications on File Prior to Visit  Medication Sig   ALPRAZolam (XANAX) 1 MG tablet TAKE 1/2 TO 1 TABLET BY MOUTH EVERY NIGHT AT BEDTIME. LIMIT TO 5 DAYS PER WEEK TO AVOID ADDICTION   Cholecalciferol (VITAMIN D) 125 MCG (5000 UT) CAPS Take by mouth.   estradiol (ESTRACE) 1 MG tablet Take 1 mg by mouth daily.   GEMTESA 75 MG TABS Take by mouth.   levothyroxine (SYNTHROID) 88 MCG tablet Take 1 tablet daily except 1/2 tab on Tues/Sat for thyroid on empty stomach with water only.   naproxen sodium (ALEVE) 220 MG tablet Take 2 tabs twice daily with food as needed for back pain.   sulfamethoxazole-trimethoprim (BACTRIM DS) 800-160 MG tablet Take 1 tablet by mouth 2 (two) times daily.   No current facility-administered medications on file prior to visit.    ROS: all negative except what is noted in the HPI.   Physical Exam:  BP 130/80   Pulse 76   Temp 97.8 F (36.6 C)   Ht 5' 0.5" (1.537 m)   Wt 134 lb 12.8 oz (61.1 kg)   SpO2 98%   BMI 25.89 kg/m   General Appearance: NAD.  Awake, conversant and cooperative. Eyes: PERRLA, EOMs intact.  Sclera white.  Conjunctiva without erythema. Sinuses: No frontal/maxillary tenderness.  No nasal discharge. Nares patent.  ENT/Mouth: Ext aud canals clear.  Bilateral TMs w/DOL and without erythema or bulging. Hearing intact.  Posterior pharynx without swelling or exudate.  Tonsils without swelling or erythema.  Neck: Supple.  No masses, nodules or thyromegaly. Respiratory: Effort is regular with non-labored breathing. Breath sounds are equal bilaterally without rales, rhonchi, wheezing or stridor.  Cardio: RRR with no MRGs. Brisk peripheral pulses without edema.  Abdomen: Active BS in all four quadrants.  Soft and  non-tender without guarding, rebound tenderness, hernias or masses. Lymphatics: Non tender without lymphadenopathy.  Musculoskeletal: Full ROM, 5/5 strength, normal ambulation.  No clubbing or cyanosis. Skin: Appropriate color for ethnicity. Warm without rashes, lesions, ecchymosis, ulcers.  Neuro: CN II-XII grossly normal. Normal muscle tone without cerebellar symptoms and intact sensation.   Psych: AO X 3,  appropriate mood and affect, insight and judgment.     Adela Glimpse, NP 12:04 PM [facility]

## 2022-12-29 DIAGNOSIS — M5416 Radiculopathy, lumbar region: Secondary | ICD-10-CM | POA: Diagnosis not present

## 2022-12-29 DIAGNOSIS — M4317 Spondylolisthesis, lumbosacral region: Secondary | ICD-10-CM | POA: Diagnosis not present

## 2023-01-03 NOTE — Progress Notes (Signed)
01/09/2023 Monique Thomas [id number] 1950-10-31  Referring provider: Raynelle Dick, NP Primary GI doctor: Dr. Leone Payor  ASSESSMENT AND PLAN:    Gastroesophageal reflux disease without associated dysphagia, odynophagia, weight loss, melena.  Patient's biggest complaint is an abnormal noise that "sounds like a frog to her" usually during and after eating.  Worse last 6 months. Without any red flag symptoms and patient is not interested in endoscopic evaluation will proceed with pantoprazole 8 weeks and then trial off to see if this helps. If symptoms continue after trial of medication or any worsening symptoms we will plan for upper endoscopy, patient is agreeable to this and will follow-up in 8 weeks. After reviewing labs patient is also had decrease in iron over the last several years, she had normal iron and ferritin however iron saturation was low when it was last checked in December. Will recheck iron indices, if she has true iron deficiency will plan for endoscopic evaluation.  Chronic idiopathic constipation Pretty well-controlled with diet and Colace - Increase fiber/ water intake, decrease caffeine, increase activity level.  Anemia, unspecified type Normal CBC, last iron saturation was low at 14, iron 48 Will recheck iron and ferritin, plan for colonoscopy and endoscopy if patient has IDA  Screen for colon cancer Cologuard 08/02/2021 negative, last colonoscopy 2008 unremarkable    Patient Care Team: Lucky Cowboy, MD as PCP - General (Internal Medicine) Marney Setting, MD as Referring Physician (Gastroenterology) Annamaria Helling, MD as Consulting Physician (Obstetrics and Gynecology) Jethro Bolus, MD (Inactive) as Consulting Physician (Urology) Altamese Cabal, PA-C as Physician Assistant (Orthopedic Surgery) Janalyn Harder, MD (Inactive) as Consulting Physician (Dermatology) Iva Boop, MD as Consulting Physician (Gastroenterology) Glenford Peers, OD  as Referring Physician (Optometry) Marguerita Beards, MD as Consulting Physician (Obstetrics and Gynecology)  HISTORY OF PRESENT ILLNESS: 72 y.o. female with a past medical history of hypothyroidism, vitamin D def, chol, anxiety, chronic pain syndrome, GERD and others listed below presents for evaluation of GERD.   2008 Colonoscopy 08/02/2021 Cologuard negative recall 08/02/2024 She denies blood thinner use.   Patient reports GERD that comes and goes, has burping, denies any associated AB pain.  She feels she has a croaking noise after eating for 6 months, and laying on your left side.  She has had sinuses/nose running.  She denies dysphagia, aspiration,  melena.  She denies nausea, vomiting but can have regurgitation of food, no specific foods.  She  denies AB bloating.  No unintentional weight loss, no night sweats. She denies nocturnal symptoms.   She reports NSAID use, 2 aleve every other day for years.  Recent prednisone every spring for cough/allergies.  She denies ETOH use, denies tobacco use.  She denies drug use. She has BM daily or every other day, takes colace as needed, otherwise no issues.   Labs reviewed show no anemia, no leukocytosis, 08/2022 iron and ferritin normal, percent saturation iron low.  Normal B12.   She  reports that she has never smoked. She has never used smokeless tobacco. She reports that she does not drink alcohol and does not use drugs.  RELEVANT LABS AND IMAGING: CBC    Component Value Date/Time   WBC 4.6 11/23/2022 1625   RBC 4.16 11/23/2022 1625   HGB 12.5 11/23/2022 1625   HCT 37.0 11/23/2022 1625   PLT 373 11/23/2022 1625   MCV 88.9 11/23/2022 1625   MCH 30.0 11/23/2022 1625   MCHC 33.8 11/23/2022 1625   RDW 12.3 11/23/2022 1625  LYMPHSABS 1,789 11/23/2022 1625   MONOABS 0.2 03/29/2017 1025   EOSABS 202 11/23/2022 1625   BASOSABS 41 11/23/2022 1625   Recent Labs    02/01/22 1106 03/28/22 1357 08/22/22 1721 11/23/22 1625   HGB 12.3 11.9 12.8 12.5    CMP     Component Value Date/Time   NA 141 11/23/2022 1625   K 3.9 11/23/2022 1625   CL 106 11/23/2022 1625   CO2 26 11/23/2022 1625   GLUCOSE 85 11/23/2022 1625   BUN 15 11/23/2022 1625   CREATININE 0.59 (L) 11/23/2022 1625   CALCIUM 9.3 11/23/2022 1625   PROT 6.8 11/23/2022 1625   ALBUMIN 4.4 10/31/2016 1455   AST 18 11/23/2022 1625   ALT 12 11/23/2022 1625   ALKPHOS 53 10/31/2016 1455   BILITOT 0.4 11/23/2022 1625   GFRNONAA 91 01/28/2021 0931   GFRAA 105 01/28/2021 0931      Latest Ref Rng & Units 11/23/2022    4:25 PM 08/22/2022    5:21 PM 02/01/2022   11:06 AM  Hepatic Function  Total Protein 6.1 - 8.1 g/dL 6.8  6.9  7.0   AST 10 - 35 U/L 18  17  20    ALT 6 - 29 U/L 12  11  14    Total Bilirubin 0.2 - 1.2 mg/dL 0.4  0.4  0.5       Current Medications:   Current Outpatient Medications (Endocrine & Metabolic):    estradiol (ESTRACE) 1 MG tablet, Take 1 mg by mouth daily.   levothyroxine (SYNTHROID) 88 MCG tablet, Take 1 tablet daily except 1/2 tab on Tues/Sat for thyroid on empty stomach with water only.    Current Outpatient Medications (Analgesics):    naproxen sodium (ALEVE) 220 MG tablet, Take 2 tabs twice daily with food as needed for back pain.   Current Outpatient Medications (Other):    ALPRAZolam (XANAX) 1 MG tablet, TAKE 1/2 TO 1 TABLET BY MOUTH EVERY NIGHT AT BEDTIME. LIMIT TO 5 DAYS PER WEEK TO AVOID ADDICTION   Cholecalciferol (VITAMIN D) 125 MCG (5000 UT) CAPS, Take by mouth.   GEMTESA 75 MG TABS, Take by mouth.   pantoprazole (PROTONIX) 40 MG tablet, Take 1 tablet (40 mg total) by mouth daily.  Medical History:  Past Medical History:  Diagnosis Date   Anxiety    Arthritis    Back pain, chronic    Depression    GERD (gastroesophageal reflux disease)    History of kidney stones 2015   Hyperlipidemia    Hypothyroidism    Skin abnormality 12/07/2021   face red with 2 blisters intact on forehead due to tolak crean  tx for skin cancer   Skin Cancer (HCC)    SUI (stress urinary incontinence, female)    wears pads   Thyroid disease    TMJ (temporomandibular joint disorder)    joint has dislocated x 3 in past   Allergies:  Allergies  Allergen Reactions   Celebrex [Celecoxib] Other (See Comments)    "panic attack"   Other Other (See Comments)    Skin bruises very easily and peels back   Topiramate Other (See Comments)    Agitation   Gabapentin     sedation    Lyrica [Pregabalin]     sedation   Zofran [Ondansetron] Other (See Comments)    Headache   Entex Lq [Phenylephrine-Guaifenesin] Rash   Fiorinal [Butalbital-Aspirin-Caffeine] Rash     Surgical History:  She  has a past surgical history that includes  Abdominal hysterectomy; Cesarean section; Hernia repair; Appendectomy; Tonsillectomy and adenoidectomy; Spine surgery; Laparoscopic lysis intestinal adhesions; Maximum access (mas)posterior lumbar interbody fusion (plif) 1 level (N/A, 04/16/2015); Anterior lat lumbar fusion (N/A, 04/07/2017); Total knee arthroplasty (Left, 07/17/2018); Extracorporeal shock wave lithotripsy (2015); Bladder suspension (2013); Bladder suspension (N/A, 12/09/2021); Cystoscopy (N/A, 12/09/2021); and Excision of mesh (N/A, 03/14/2022). Family History:  Her family history includes Asthma in her sister; Cancer in her maternal grandmother; Heart disease in her mother; Hyperlipidemia in her mother; Thyroid disease in her mother.  REVIEW OF SYSTEMS  : All other systems reviewed and negative except where noted in the History of Present Illness.  PHYSICAL EXAM: BP 120/80   Pulse 73   Ht 5' (1.524 m)   Wt 134 lb (60.8 kg)   SpO2 96%   BMI 26.17 kg/m  General Appearance: Well nourished, in no apparent distress. Head:   Normocephalic and atraumatic. Eyes:  sclerae anicteric,conjunctive pink  Respiratory: Respiratory effort normal, BS equal bilaterally without rales, rhonchi, wheezing. Cardio: RRR with no MRGs. Peripheral  pulses intact.  Abdomen: Soft,  Non-distended ,active bowel sounds. No tenderness . Without guarding and Without rebound. No masses. Rectal: Not evaluated Musculoskeletal: Full ROM, Normal gait. Without edema. Skin:  Dry and intact without significant lesions or rashes Neuro: Alert and  oriented x4;  No focal deficits. Psych:  Cooperative. Normal mood and affect.    Doree Albee, PA-C 12:47 PM

## 2023-01-06 DIAGNOSIS — M47816 Spondylosis without myelopathy or radiculopathy, lumbar region: Secondary | ICD-10-CM | POA: Diagnosis not present

## 2023-01-09 ENCOUNTER — Ambulatory Visit: Payer: Medicare PPO | Admitting: Physician Assistant

## 2023-01-09 ENCOUNTER — Encounter: Payer: Self-pay | Admitting: Physician Assistant

## 2023-01-09 ENCOUNTER — Other Ambulatory Visit (INDEPENDENT_AMBULATORY_CARE_PROVIDER_SITE_OTHER): Payer: Medicare PPO

## 2023-01-09 VITALS — BP 120/80 | HR 73 | Ht 60.0 in | Wt 134.0 lb

## 2023-01-09 DIAGNOSIS — D649 Anemia, unspecified: Secondary | ICD-10-CM

## 2023-01-09 DIAGNOSIS — Z1211 Encounter for screening for malignant neoplasm of colon: Secondary | ICD-10-CM

## 2023-01-09 DIAGNOSIS — K2100 Gastro-esophageal reflux disease with esophagitis, without bleeding: Secondary | ICD-10-CM

## 2023-01-09 DIAGNOSIS — K5904 Chronic idiopathic constipation: Secondary | ICD-10-CM

## 2023-01-09 LAB — IBC + FERRITIN
Ferritin: 15.6 ng/mL (ref 10.0–291.0)
Iron: 50 ug/dL (ref 42–145)
Saturation Ratios: 16.2 % — ABNORMAL LOW (ref 20.0–50.0)
TIBC: 309.4 ug/dL (ref 250.0–450.0)
Transferrin: 221 mg/dL (ref 212.0–360.0)

## 2023-01-09 MED ORDER — PANTOPRAZOLE SODIUM 40 MG PO TBEC: 40.0000 mg | DELAYED_RELEASE_TABLET | Freq: Every day | ORAL | 0 refills | Status: DC

## 2023-01-09 NOTE — Patient Instructions (Addendum)
Your provider has requested that you go to the basement level for lab work before leaving today. Press "B" on the elevator. The lab is located at the first door on the left as you exit the elevator.  If your iron is low, will suggest endoscopy and colonoscopy.  If not we will start you on protonix 40 mg once daily for 8-12 weeks and then can do trial off as needed or switch to pepcid Please take this medication 30 minutes to 1 hour before meals- this makes it more effective.  Avoid spicy and acidic foods Avoid fatty foods Limit your intake of coffee, tea, alcohol, and carbonated drinks Work to maintain a healthy weight Keep the head of the bed elevated at least 3 inches with blocks or a wedge pillow if you are having any nighttime symptoms Stay upright for 2 hours after eating Avoid meals and snacks three to four hours before bedtime  Follow up 8 weeks, if not better will suggest endoscopy   _______________________________________________________  If your blood pressure at your visit was 140/90 or greater, please contact your primary care physician to follow up on this.  _______________________________________________________  If you are age 31 or older, your body mass index should be between 23-30. Your Body mass index is 26.17 kg/m. If this is out of the aforementioned range listed, please consider follow up with your Primary Care Provider.  If you are age 76 or younger, your body mass index should be between 19-25. Your Body mass index is 26.17 kg/m. If this is out of the aformentioned range listed, please consider follow up with your Primary Care Provider.   ________________________________________________________  The [facility] providers would like to encourage you to use [facility] to communicate with providers for non-urgent requests or questions.  Due to long hold times on the telephone, sending your provider a message by [facility] may be a faster and more efficient way to get a  response.  Please allow 48 business hours for a response.  Please remember that this is for non-urgent requests.  _______________________________________________________   I appreciate the  opportunity to care for you  Thank You   [facility]

## 2023-01-10 ENCOUNTER — Other Ambulatory Visit: Payer: Self-pay

## 2023-01-10 DIAGNOSIS — D649 Anemia, unspecified: Secondary | ICD-10-CM

## 2023-01-10 NOTE — Telephone Encounter (Signed)
Spoke with the patient about her results. Patient has low normal iron and ferritin, low iron saturation.  And has been continually slowly decreasing for several years.  She is not on an iron supplement. She is NOT interested in getting a colonoscopy at this time.  Not having any symptoms, had normal Cologuard 07/2021, due for recall next year.  With her symptoms and low iron saturation we will start with upper endoscopy. This will be with Dr. Leone Payor, requesting first available. If this is negative she understands may need to continue with colonoscopy or monitor very closely.

## 2023-01-20 ENCOUNTER — Ambulatory Visit (AMBULATORY_SURGERY_CENTER): Payer: Medicare PPO

## 2023-01-20 VITALS — Ht 60.0 in | Wt 136.0 lb

## 2023-01-20 DIAGNOSIS — B3731 Acute candidiasis of vulva and vagina: Secondary | ICD-10-CM | POA: Insufficient documentation

## 2023-01-20 DIAGNOSIS — M47816 Spondylosis without myelopathy or radiculopathy, lumbar region: Secondary | ICD-10-CM | POA: Insufficient documentation

## 2023-01-20 DIAGNOSIS — K2100 Gastro-esophageal reflux disease with esophagitis, without bleeding: Secondary | ICD-10-CM

## 2023-01-20 DIAGNOSIS — D649 Anemia, unspecified: Secondary | ICD-10-CM

## 2023-01-20 DIAGNOSIS — N951 Menopausal and female climacteric states: Secondary | ICD-10-CM | POA: Insufficient documentation

## 2023-01-20 DIAGNOSIS — N390 Urinary tract infection, site not specified: Secondary | ICD-10-CM | POA: Insufficient documentation

## 2023-01-20 DIAGNOSIS — Z1211 Encounter for screening for malignant neoplasm of colon: Secondary | ICD-10-CM

## 2023-01-20 MED ORDER — SUTAB 1479-225-188 MG PO TABS
12.0000 | ORAL_TABLET | ORAL | 0 refills | Status: DC
Start: 2023-01-20 — End: 2023-02-28

## 2023-01-20 NOTE — Progress Notes (Signed)
No egg or soy allergy known to patient  No issues known to pt with past sedation with any surgeries or procedures Patient denies ever being told they had issues or difficulty with intubation  No FH of Malignant Hyperthermia Pt is not on diet pills Pt is not on  home 02  Pt is not on blood thinners  Pt reports constipation due to synthroid reports a BM every other day uses suppository PRN  No A fib or A flutter Have any cardiac testing pending--no  Pt denies any issues with mobility   Pt requesting SUTAB due to not being able to tolerate fluid for liquid prep. coupon given to provide for a discount at the rx   PV complete prep instructions reviewed with patient hard copy provided at visit   Patient's chart reviewed by Cathlyn Parsons CNRA prior to previsit and patient appropriate for the [facility].  Previsit completed and red dot placed by patient's name on their procedure day (on provider's schedule).

## 2023-01-25 ENCOUNTER — Encounter: Payer: Medicare PPO | Admitting: Internal Medicine

## 2023-01-26 DIAGNOSIS — L570 Actinic keratosis: Secondary | ICD-10-CM | POA: Diagnosis not present

## 2023-01-26 DIAGNOSIS — L814 Other melanin hyperpigmentation: Secondary | ICD-10-CM | POA: Diagnosis not present

## 2023-01-26 DIAGNOSIS — L820 Inflamed seborrheic keratosis: Secondary | ICD-10-CM | POA: Diagnosis not present

## 2023-01-26 DIAGNOSIS — L821 Other seborrheic keratosis: Secondary | ICD-10-CM | POA: Diagnosis not present

## 2023-01-30 ENCOUNTER — Encounter: Payer: Self-pay | Admitting: Internal Medicine

## 2023-02-01 ENCOUNTER — Ambulatory Visit: Payer: Medicare PPO | Admitting: Nurse Practitioner

## 2023-02-05 ENCOUNTER — Encounter: Payer: Self-pay | Admitting: Certified Registered Nurse Anesthetist

## 2023-02-06 DIAGNOSIS — M47816 Spondylosis without myelopathy or radiculopathy, lumbar region: Secondary | ICD-10-CM | POA: Diagnosis not present

## 2023-02-09 ENCOUNTER — Ambulatory Visit (AMBULATORY_SURGERY_CENTER): Payer: Medicare PPO | Admitting: Internal Medicine

## 2023-02-09 ENCOUNTER — Encounter: Payer: Self-pay | Admitting: Internal Medicine

## 2023-02-09 ENCOUNTER — Telehealth: Payer: Self-pay | Admitting: *Deleted

## 2023-02-09 VITALS — BP 120/64 | HR 60 | Temp 98.0°F | Resp 9 | Ht 60.0 in | Wt 136.0 lb

## 2023-02-09 DIAGNOSIS — K2970 Gastritis, unspecified, without bleeding: Secondary | ICD-10-CM | POA: Diagnosis not present

## 2023-02-09 DIAGNOSIS — F419 Anxiety disorder, unspecified: Secondary | ICD-10-CM | POA: Diagnosis not present

## 2023-02-09 DIAGNOSIS — K2950 Unspecified chronic gastritis without bleeding: Secondary | ICD-10-CM | POA: Diagnosis not present

## 2023-02-09 DIAGNOSIS — K449 Diaphragmatic hernia without obstruction or gangrene: Secondary | ICD-10-CM

## 2023-02-09 DIAGNOSIS — K5730 Diverticulosis of large intestine without perforation or abscess without bleeding: Secondary | ICD-10-CM | POA: Diagnosis not present

## 2023-02-09 DIAGNOSIS — D508 Other iron deficiency anemias: Secondary | ICD-10-CM

## 2023-02-09 DIAGNOSIS — D509 Iron deficiency anemia, unspecified: Secondary | ICD-10-CM | POA: Diagnosis not present

## 2023-02-09 DIAGNOSIS — E785 Hyperlipidemia, unspecified: Secondary | ICD-10-CM | POA: Diagnosis not present

## 2023-02-09 DIAGNOSIS — F32A Depression, unspecified: Secondary | ICD-10-CM | POA: Diagnosis not present

## 2023-02-09 DIAGNOSIS — E039 Hypothyroidism, unspecified: Secondary | ICD-10-CM | POA: Diagnosis not present

## 2023-02-09 MED ORDER — SODIUM CHLORIDE 0.9 % IV SOLN
500.0000 mL | Freq: Once | INTRAVENOUS | Status: DC
Start: 2023-02-09 — End: 2023-02-09

## 2023-02-09 NOTE — Telephone Encounter (Signed)
Received a message on My chart from patient.  She took her suprep pills and kept them down for at least an hour and a half but then vomited.  She says that her stools are liquid but brown.  She thinks she could drink sprite or ginger ale at this point.  I encourage her to continue to drink as much liquid as she can tolerate. I told her she could drink up until 12:00 pm.  She will call back as needed.

## 2023-02-09 NOTE — Progress Notes (Signed)
[facility] History and Physical   Primary Care Physician:  Lucky Cowboy, MD   Reason for Procedure:   Iron deficiency anemia  Plan:    Colonoscopy, EGD     HPI: Monique Thomas is a 72 y.o. female w/ GERD sxs, chronic constipation issues. Also some ? Of dtysphagia  Lab Results  Component Value Date   WBC 4.6 11/23/2022   HGB 12.5 11/23/2022   HCT 37.0 11/23/2022   MCV 88.9 11/23/2022   PLT 373 11/23/2022     Lab Results  Component Value Date   IRON 50 01/09/2023   TIBC 309.4 01/09/2023   FERRITIN 15.6 01/09/2023      Past Medical History:  Diagnosis Date   Anxiety    Arthritis    Back pain, chronic    Depression    GERD (gastroesophageal reflux disease)    History of kidney stones 2015   Hyperlipidemia    Hypothyroidism    Skin abnormality 12/07/2021   face red with 2 blisters intact on forehead due to [place]ak crean tx for skin cancer   Skin Cancer (HCC)    SUI (stress urinary incontinence, female)    wears pads   Thyroid disease    TMJ (temporomandibular joint disorder)    joint has dislocated x 3 in past    Past Surgical History:  Procedure Laterality Date   ABDOMINAL HYSTERECTOMY     age 6, 1 ovary reved at surgery 1 ovary removed later   ANTERIOR LAT LUMBAR FUSION N/A 04/07/2017   Procedure: LUMBAR ONE-TWO ANTERIOR LATERAL LUMBAR FUSION WITH LATERAL PLATE;  Surgeon: Tia Alert, MD;  Location: [facility] OR;  Service: Neurosurgery;  Laterality: N/A;  Right side approach   APPENDECTOMY     age 21   BLADDER SUSPENSION  2013   BLADDER SUSPENSION N/A 12/09/2021   Procedure: TRANSVAGINAL TAPE (TVT) PROCEDURE;  Surgeon: Marguerita Beards, MD;  Location: [facility];  Service: Gynecology;  Laterality: N/A;   CESAREAN SECTION     x 2   CYSTOSCOPY N/A 12/09/2021   Procedure: CYSTOSCOPY;  Surgeon: Marguerita Beards, MD;  Location: [facility];  Service: Gynecology;  Laterality: N/A;   EXCISION OF MESH N/A  03/14/2022   Procedure: Cleon Dew REVISION;  Surgeon: Marguerita Beards, MD;  Location: [facility];  Service: Gynecology;  Laterality: N/A;  total time requested is 1 hour   EXTRACORPOREAL SHOCK WAVE LITHOTRIPSY  2015   HERNIA REPAIR     umbilical yrs ago   LAPAROSCOPIC LYSIS INTESTINAL ADHESIONS     yrs ago   MAXIMUM ACCESS (MAS)POSTERIOR LUMBAR INTERBODY FUSION (PLIF) 1 LEVEL N/A 04/16/2015   Procedure: masPLIF  - L2-L3 ;  Surgeon: Tia Alert, MD;  Location: [facility];  Service: Neurosurgery;  Laterality: N/A;  masPLIF  - L2-L3    SPINE SURGERY     lumbar before 2016 surgery   TONSILLECTOMY AND ADENOIDECTOMY     age 38   TOTAL KNEE ARTHROPLASTY Left 07/17/2018   Procedure: LEFT TOTAL KNEE ARTHROPLASTY;  Surgeon: Durene Romans, MD;  Location: [facility];  Service: Orthopedics;  Laterality: Left;  70 mins    Prior to Admission medications   Medication Sig Start Date End Date Taking? Authorizing Provider  ALPRAZolam (XANAX) 1 MG tablet TAKE 1/2 TO 1 TABLET BY MOUTH EVERY NIGHT AT BEDTIME. LIMIT TO 5 DAYS PER WEEK TO AVOID ADDICTION Patient taking differently: Take 1 mg by mouth at bedtime as  needed. TAKE 1/2 TO 1 TABLET BY MOUTH EVERY NIGHT AT BEDTIME. LIMIT TO 5 DAYS PER WEEK TO AVOID ADDICTION 11/23/22  Yes Raynelle Dick, NP  estradiol (ESTRACE) 1 MG tablet Take 1 mg by mouth daily.   Yes [provider]  GEMTESA 75 MG TABS Take by mouth. 10/23/22  Yes [provider]  levothyroxine (SYNTHROID) 88 MCG tablet Take 1 tablet daily except 1/2 tab on Tues/Sat for thyroid on empty stomach with water only. 07/28/21  Yes Judd Gaudier, NP  Sodium Sulfate-Mag Sulfate-KCl (SUTAB) [phone] MG TABS Take 12 tablets by mouth as directed. Please use Sutab coupon provided by patient 01/20/23  Yes Iva Boop, MD  Cholecalciferol (VITAMIN D) 125 MCG (5000 UT) CAPS Take by mouth.    [provider]  lidocaine (LIDODERM) 5 % Place 1 patch onto the skin  daily. 01/10/23   [provider]  naproxen sodium (ALEVE) 220 MG tablet Take 2 tabs twice daily with food as needed for back pain. 03/31/22   Judd Gaudier, NP  pantoprazole (PROTONIX) 40 MG tablet Take 1 tablet (40 mg total) by mouth daily. 01/09/23   Doree Albee, PA-C    Current Outpatient Medications  Medication Sig Dispense Refill   ALPRAZolam (XANAX) 1 MG tablet TAKE 1/2 TO 1 TABLET BY MOUTH EVERY NIGHT AT BEDTIME. LIMIT TO 5 DAYS PER WEEK TO AVOID ADDICTION (Patient taking differently: Take 1 mg by mouth at bedtime as needed. TAKE 1/2 TO 1 TABLET BY MOUTH EVERY NIGHT AT BEDTIME. LIMIT TO 5 DAYS PER WEEK TO AVOID ADDICTION) 30 tablet 0   estradiol (ESTRACE) 1 MG tablet Take 1 mg by mouth daily.     GEMTESA 75 MG TABS Take by mouth.     levothyroxine (SYNTHROID) 88 MCG tablet Take 1 tablet daily except 1/2 tab on Tues/Sat for thyroid on empty stomach with water only. 30 tablet 11   Sodium Sulfate-Mag Sulfate-KCl (SUTAB) [phone] MG TABS Take 12 tablets by mouth as directed. Please use Sutab coupon provided by patient 24 tablet 0   Cholecalciferol (VITAMIN D) 125 MCG (5000 UT) CAPS Take by mouth.     lidocaine (LIDODERM) 5 % Place 1 patch onto the skin daily.     naproxen sodium (ALEVE) 220 MG tablet Take 2 tabs twice daily with food as needed for back pain.     pantoprazole (PROTONIX) 40 MG tablet Take 1 tablet (40 mg total) by mouth daily. 90 tablet 0   Current Facility-Administered Medications  Medication Dose Route Frequency Provider Last Rate Last Admin   0.9 %  sodium chloride infusion  500 mL Intravenous Once Iva Boop, MD        Allergies as of 02/09/2023 - Review Complete 02/09/2023  Allergen Reaction Noted   Celebrex [celecoxib] Other (See Comments) 10/07/2014   Other Other (See Comments) 04/06/2015   Topiramate Other (See Comments) 09/15/2013   Gabapentin  08/22/2022   Lyrica [pregabalin]  08/22/2022   Zofran [ondansetron] Other (See Comments)  07/27/2021   Entex lq [phenylephrine-guaifenesin] Rash 09/15/2013   Fiorinal [butalbital-aspirin-caffeine] Rash 09/15/2013    Family History  Problem Relation Age of Onset   Heart disease Mother    Hyperlipidemia Mother    Thyroid disease Mother    Asthma Sister    Cancer Maternal Grandmother        ovarian   Colon cancer Neg Hx    Rectal cancer Neg Hx    Stomach cancer Neg Hx  Diabetes Neg Hx     Social History   Socioeconomic History   Marital status: Married    Spouse name: Not on file   Number of children: Not on file   Years of education: Not on file   Highest education level: Not on file  Occupational History   Not on file  Tobacco Use   Smoking status: Never   Smokeless tobacco: Never  Vaping Use   Vaping Use: Never used  Substance and Sexual Activity   Alcohol use: No   Drug use: No   Sexual activity: Yes    Birth control/protection: Surgical  Other Topics Concern   Not on file  Social History Narrative   Not on file   Social Determinants of Health   Financial Resource Strain: Not on file  Food Insecurity: Not on file  Transportation Needs: Not on file  Physical Activity: Not on file  Stress: Not on file  Social Connections: Not on file  Intimate Partner Violence: Not on file    Review of Systems:  All other review of systems negative except as mentioned in the HPI.  Physical Exam: Vital signs BP (!) 123/55   Pulse 70   Temp 98 F (36.7 C)   Ht 5' (1.524 m)   Wt 136 lb (61.7 kg)   SpO2 98%   BMI 26.56 kg/m   General:   Alert,  Well-developed, well-nourished, pleasant and cooperative in NAD Lungs:  Clear throughout to auscultation.   Heart:  Regular rate and rhythm; no murmurs, clicks, rubs,  or gallops. Abdomen:  Soft, nontender and nondistended. Normal bowel sounds.   Neuro/Psych:  Alert and cooperative. Normal mood and affect. A and O x 3   @Khiara Shuping  Sena Slate, MD, [facility] [phone] (pager) 02/09/2023  2:23 PM@

## 2023-02-09 NOTE — Op Note (Signed)
[facility] Patient Name: Monique Thomas Procedure Date: 02/09/2023 2:27 PM MRN: [id number] Endoscopist: Iva Boop , MD, [id number] Age: 72 Referring MD:  Date of Birth: [birth year] Gender: Female Account #: [id number] Procedure:                Upper GI endoscopy Indications:              Iron deficiency anemia, Heartburn Medicines:                Monitored Anesthesia Care Procedure:                Pre-Anesthesia Assessment:                           - Prior to the procedure, a History and Physical                            was performed, and patient medications and                            allergies were reviewed. The patient's tolerance of                            previous anesthesia was also reviewed. The risks                            and benefits of the procedure and the sedation                            options and risks were discussed with the patient.                            All questions were answered, and informed consent                            was obtained. Prior Anticoagulants: The patient has                            taken no anticoagulant or antiplatelet agents. ASA                            Grade Assessment: II - A patient with mild systemic                            disease. After reviewing the risks and benefits,                            the patient was deemed in satisfactory condition to                            undergo the procedure.                           After obtaining informed consent, the endoscope was  passed under direct vision. Throughout the                            procedure, the patient's blood pressure, pulse, and                            oxygen saturations were monitored continuously. The                            Olympus scope [id number] was introduced through the                            mouth, and advanced to the second part of duodenum.                            The upper GI  endoscopy was accomplished without                            difficulty. The patient tolerated the procedure                            well. Scope In: Scope Out: Findings:                 The examined esophagus was mildly tortuous.                           A 6 cm hiatal hernia was present.                           Diffuse moderate inflammation characterized by                            congestion (edema), erythema and friability was                            found in the gastric body and in the gastric                            antrum. Biopsies were taken with a cold forceps for                            histology. Verification of patient identification                            for the specimen was done. Estimated blood loss was                            minimal.                           The cardia and gastric fundus were normal on                            retroflexion.  The gastroesophageal flap valve was visualized                            endoscopically and classified as Hill Grade IV (no                            fold, wide open lumen, hiatal hernia present).                           The examined duodenum was normal. Complications:            No immediate complications. Estimated Blood Loss:     Estimated blood loss was minimal. Impression:               - Tortuous esophagus.                           - 6 cm hiatal hernia.                           - Gastritis. Biopsied. Antrum and Body/Fundus -                            ant/post, lesser/greater curves in both (and                            incisura)                           - Gastroesophageal flap valve classified as Hill                            Grade IV (no fold, wide open lumen, hiatal hernia                            present).                           - Normal examined duodenum. Recommendation:           - Patient has a contact number available for                             emergencies. The signs and symptoms of potential                            delayed complications were discussed with the                            patient. Return to normal activities tomorrow.                            Written discharge instructions were provided to the                            patient.                           -  Resume previous diet.                           - See the other procedure note for documentation of                            additional recommendations. Iva Boop, MD 02/09/2023 3:13:33 PM This report has been signed electronically.

## 2023-02-09 NOTE — Progress Notes (Signed)
1427 Robinul 0.1 mg IV given due large amount of secretions upon assessment.  MD made aware, vss 

## 2023-02-09 NOTE — Patient Instructions (Addendum)
I found a hiatal hernia and inflamed stomach - biopsied.  Colon had slight diverticulosis. No polyps or cancer seen.  I will let you know results and plans.  I appreciate the opportunity to care for you. Iva Boop, MD, [facility]  Be sure to take  your iron and your protonix.  Take your protonix on an empty stomach.  Resume  your regular medications today.    YOU HAD AN ENDOSCOPIC PROCEDURE TODAY AT THE [facility]:   Refer to the procedure report that was given to you for any specific questions about what was found during the examination.  If the procedure report does not answer your questions, please call your gastroenterologist to clarify.  If you requested that your care partner not be given the details of your procedure findings, then the procedure report has been included in a sealed envelope for you to review at your convenience later.  YOU SHOULD EXPECT: Some feelings of bloating in the abdomen. Passage of more gas than usual.  Walking can help get rid of the air that was put into your GI tract during the procedure and reduce the bloating. If you had a lower endoscopy (such as a colonoscopy or flexible sigmoidoscopy) you may notice spotting of blood in your stool or on the toilet paper. If you underwent a bowel prep for your procedure, you may not have a normal bowel movement for a few days.  Please Note:  You might notice some irritation and congestion in your nose or some drainage.  This is from the oxygen used during your procedure.  There is no need for concern and it should clear up in a day or so.  SYMPTOMS TO REPORT IMMEDIATELY:  Following lower endoscopy (colonoscopy or flexible sigmoidoscopy):  Excessive amounts of blood in the stool  Significant tenderness or worsening of abdominal pains  Swelling of the abdomen that is new, acute  Fever of 100F or higher  Following upper endoscopy (EGD)  Vomiting of blood or coffee ground material  New chest pain or pain  under the shoulder blades  Painful or persistently difficult swallowing  New shortness of breath  Fever of 100F or higher  Black, tarry-looking stools  For urgent or emergent issues, a gastroenterologist can be reached at any hour by calling [phone]. Do not use MyChart messaging for urgent concerns.    DIET:  We do recommend a small meal at first, but then you may proceed to your regular diet.  Drink plenty of fluids but you should avoid alcoholic beverages for 24 hours.  ACTIVITY:  You should plan to take it easy for the rest of today and you should NOT DRIVE or use heavy machinery until tomorrow (because of the sedation medicines used during the test).    FOLLOW UP: Our staff will call the number listed on your records the next business day following your procedure.  We will call around 7:15- 8:00 am to check on you and address any questions or concerns that you may have regarding the information given to you following your procedure. If we do not reach you, we will leave a message.     If any biopsies were taken you will be contacted by phone or by letter within the next 1-3 weeks.  Please call us at [phone] if you have not heard about the biopsies in 3 weeks.    SIGNATURES/CONFIDENTIALITY: You and/or your care partner have signed paperwork which will be entered into your electronic  medical record.  These signatures attest to the fact that that the information above on your After Visit Summary has been reviewed and is understood.  Full responsibility of the confidentiality of this discharge information lies with you and/or your care-partner.

## 2023-02-09 NOTE — Telephone Encounter (Signed)
Pt is scheduled for procedure today, please see message and call pt.

## 2023-02-09 NOTE — Progress Notes (Signed)
Pt's states no medical or surgical changes since previsit or office visit. 

## 2023-02-09 NOTE — Progress Notes (Signed)
Called to room to assist during endoscopic procedure.  Patient ID and intended procedure confirmed with present staff. Received instructions for my participation in the procedure from the performing physician.  

## 2023-02-09 NOTE — Progress Notes (Signed)
Report given to PACU, vss 

## 2023-02-09 NOTE — Op Note (Signed)
[facility] Patient Name: Monique Thomas Procedure Date: 02/09/2023 2:24 PM MRN: [id number] Endoscopist: Iva Boop , MD, [id number] Age: 72 Referring MD:  Date of Birth: 10-Jan-1951 Gender: Female Account #: [id number] Procedure:                Colonoscopy Indications:              Iron deficiency anemia Medicines:                Monitored Anesthesia Care Procedure:                Pre-Anesthesia Assessment:                           - Prior to the procedure, a History and Physical                            was performed, and patient medications and                            allergies were reviewed. The patient's tolerance of                            previous anesthesia was also reviewed. The risks                            and benefits of the procedure and the sedation                            options and risks were discussed with the patient.                            All questions were answered, and informed consent                            was obtained. Prior Anticoagulants: The patient has                            taken no anticoagulant or antiplatelet agents. ASA                            Grade Assessment: II - A patient with mild systemic                            disease. After reviewing the risks and benefits,                            the patient was deemed in satisfactory condition to                            undergo the procedure.                           After obtaining informed consent, the colonoscope  was passed under direct vision. Throughout the                            procedure, the patient's blood pressure, pulse, and                            oxygen saturations were monitored continuously. The                            [id number]-[id number] Colonoscope [id number] was introduced                            through the anus and advanced to the the cecum,                            identified by appendiceal orifice and  ileocecal                            valve. The colonoscopy was performed without                            difficulty. The patient tolerated the procedure                            well. The quality of the bowel preparation was                            adequate. The ileocecal valve, appendiceal orifice,                            and rectum were photographed. The bowel preparation                            used was SUTAB via split dose instruction. Scope In: 2:46:44 PM Scope Out: 2:58:24 PM Scope Withdrawal Time: 0 hours 7 minutes 21 seconds  Total Procedure Duration: 0 hours 11 minutes 40 seconds  Findings:                 The perianal and digital rectal examinations were                            normal.                           A few diverticula were found in the sigmoid colon.                           The exam was otherwise without abnormality on                            direct and retroflexion views. Complications:            No immediate complications. Estimated Blood Loss:     Estimated blood loss: none. Impression:               - Diverticulosis  in the sigmoid colon.                           - The examination was otherwise normal on direct                            and retroflexion views.                           - No specimens collected. No cause of iron                            deficinecy seen Recommendation:           - Patient has a contact number available for                            emergencies. The signs and symptoms of potential                            delayed complications were discussed with the                            patient. Return to normal activities tomorrow.                            Written discharge instructions were provided to the                            patient.                           - Resume previous diet.                           - Continue present medications.                           - No repeat colonoscopy due to age  and the absence                            of colonic polyps. No need for furher Cologuard or                            hemoccults Iva Boop, MD 02/09/2023 3:15:49 PM This report has been signed electronically.

## 2023-02-10 ENCOUNTER — Telehealth: Payer: Self-pay | Admitting: *Deleted

## 2023-02-10 NOTE — Telephone Encounter (Signed)
Post procedure follow up call placed, no answer and left VM.  

## 2023-02-16 DIAGNOSIS — Z6825 Body mass index (BMI) 25.0-25.9, adult: Secondary | ICD-10-CM | POA: Diagnosis not present

## 2023-02-16 DIAGNOSIS — M47816 Spondylosis without myelopathy or radiculopathy, lumbar region: Secondary | ICD-10-CM | POA: Diagnosis not present

## 2023-02-17 ENCOUNTER — Encounter: Payer: Self-pay | Admitting: Internal Medicine

## 2023-02-22 ENCOUNTER — Encounter: Payer: Self-pay | Admitting: Internal Medicine

## 2023-02-23 ENCOUNTER — Ambulatory Visit: Payer: Medicare PPO | Admitting: Internal Medicine

## 2023-02-28 ENCOUNTER — Encounter: Payer: Self-pay | Admitting: Internal Medicine

## 2023-02-28 ENCOUNTER — Ambulatory Visit: Payer: Medicare PPO | Admitting: Internal Medicine

## 2023-02-28 VITALS — BP 138/80 | HR 76 | Temp 97.9°F | Resp 17 | Ht 60.05 in | Wt 136.0 lb

## 2023-02-28 DIAGNOSIS — R0989 Other specified symptoms and signs involving the circulatory and respiratory systems: Secondary | ICD-10-CM | POA: Diagnosis not present

## 2023-02-28 DIAGNOSIS — Z79899 Other long term (current) drug therapy: Secondary | ICD-10-CM | POA: Diagnosis not present

## 2023-02-28 DIAGNOSIS — R7309 Other abnormal glucose: Secondary | ICD-10-CM

## 2023-02-28 DIAGNOSIS — E782 Mixed hyperlipidemia: Secondary | ICD-10-CM

## 2023-02-28 DIAGNOSIS — E559 Vitamin D deficiency, unspecified: Secondary | ICD-10-CM

## 2023-02-28 DIAGNOSIS — E039 Hypothyroidism, unspecified: Secondary | ICD-10-CM

## 2023-02-28 LAB — CBC WITH DIFFERENTIAL/PLATELET
Absolute Monocytes: 378 cells/uL (ref 200–950)
Basophils Absolute: 50 cells/uL (ref 0–200)
HCT: 36.8 % (ref 35.0–45.0)
Lymphs Abs: 1980 cells/uL (ref 850–3900)
MCH: 29.3 pg (ref 27.0–33.0)
MCHC: 33.4 g/dL (ref 32.0–36.0)
MCV: 87.6 fL (ref 80.0–100.0)
MPV: 8.5 fL (ref 7.5–12.5)
Neutro Abs: 1733 cells/uL (ref 1500–7800)

## 2023-02-28 NOTE — Progress Notes (Signed)
Future Appointments  Date Time Provider Department  02/28/2023                       6 mo ov 10:30 AM Lucky Cowboy, MD [facility]  03/30/2023  1:30 PM Iva Boop, MD [facility]  05/31/2023                       cpe 10:00 AM Raynelle Dick, NP [facility]  07/27/2023 10:00 AM Selmer Dominion, NP [facility]  09/25/2023                         wellness 11:00 AM Raynelle Dick, NP [facility]    History of Present Illness:       This very nice 72 y.o. MWF presents for 6 month follow up with HTN, HLD, Pre-Diabetes, GERD, RLS and Vitamin D Deficiency. Aortic Atherosclerosis was found on CT scan on 09/25/2019.           Patient has been followed expectantly since 2008 for labile HTN & BP has been controlled at home. Today's BP is at goal - 138/80 . Patient has had no complaints of any cardiac type chest pain, palpitations, dyspnea Pollyann Kennedy /PND, dizziness, claudication or dependent edema.       Hyperlipidemia is controlled with diet.  Last Lipids were  near goal :  Lab Results  Component Value Date   CHOL 203 (H) 11/23/2022   HDL 76 11/23/2022   LDLCALC 105 (H) 11/23/2022   TRIG 123 11/23/2022   CHOLHDL 2.7 11/23/2022         Patient has been on Thyroid Replacement since the 1980's    Also, the patient has been monitored expectantly for abnormal glucose and has had no symptoms of reactive hypoglycemia, diabetic polys, paresthesias or visual blurring.  Last A1c was normal & at goal:  Lab Results  Component Value Date   HGBA1C 5.6 08/22/2022         Further, the patient also has history of Vitamin D Deficiency and supplements vitamin D without any suspected side-effects. Last vitamin D was low & not at goal:  Lab Results  Component Value Date   VD25OH 2 (L) 08/22/2022       Current Outpatient Medications  Medication Instructions   ALPRAZolam (XANAX) 1 MG tablet TAKE 1/2 TO 1 TABLET AT BEDTIME   VITAMIN D 5000 u  daily   estradiol   1 mg 1 Daily   GEMTESA 75  MG TABS daily   levothyroxine 88 MCG tablet Take 1 tablet daily except 1/2 tab on Tues/Sat    lidocaine (LIDODERM) 5 % 1 patch Daily   naproxen 220 MG tablet Take 2 tabs twice daily with food as needed   pantoprazole  40 mg, Oral, Daily     Allergies  Allergen Reactions   Celebrex [Celecoxib] Other (See Comments)    "panic attack"   Other Other (See Comments)    Skin bruises very easily and peels back   Topamax [Topiramate] Other (See Comments)    MEMORY   Entex Lq [Phenylephrine-Guaifenesin] Rash   Fiorinal [Butalbital-Aspirin-Caffeine] Rash     PMHx:   Past Medical History:  Diagnosis Date   Anemia    Anxiety    Arthritis    Back pain, chronic    Cancer (HCC)  2 weeks ago    skin cancer  left anterior wrist   and  right side of  face      Depression    Diverticulosis 09/25/2019   GERD (gastroesophageal reflux disease)    Hyperlipidemia    Hypothyroidism    Kidney stone    2015   Thyroid disease    Umbilical hernia    vitamin D deficiency      Immunization History  Administered Date(s) Administered   Influenza Split 06/29/2015   Influenza Whole 05/21/2013   Influenza, High Dose   06/24/2019, 07/02/2020   Influenza,inj,quad 06/19/2017   Influenza 06/09/2014, 06/20/2015   PPD Test 09/23/2013, 10/01/2014, 02/26/2016   Pneumococcal -13 05/03/2017   Pneumococcal -23 09/23/2013   Pneumococcal-23 06/19/2017   Td 09/19/2005   Tdap 10/07/2015   Zoster 03/27/2014   Zoster Recombinat (Shingrix) 12/03/2020     Past Surgical History:  Procedure Laterality Date   ABDOMINAL HYSTERECTOMY     ANTERIOR LAT LUMBAR FUSION N/A 04/07/2017   Procedure: LUMBAR ONE-TWO ANTERIOR LATERAL LUMBAR FUSION WITH LATERAL PLATE;  Surgeon: Tia Alert, MD;  Location: [facility] OR;  Service: Neurosurgery;  Laterality: N/A;  Right side approach   APPENDECTOMY     CESAREAN SECTION     HERNIA REPAIR     LAPAROSCOPIC LYSIS INTESTINAL ADHESIONS     MAXIMUM ACCESS (MAS)POSTERIOR LUMBAR INTERBODY  FUSION (PLIF) 1 LEVEL N/A 04/16/2015   Procedure: masPLIF  - L2-L3 ;  Surgeon: Tia Alert, MD;  Location: [facility];  Service: Neurosurgery;  Laterality: N/A;  masPLIF  - L2-L3    SPINE SURGERY     lumbar   TONSILLECTOMY AND ADENOIDECTOMY     TOTAL KNEE ARTHROPLASTY Left 07/17/2018   Procedure: LEFT TOTAL KNEE ARTHROPLASTY;  Surgeon: Durene Romans, MD;  Location: [facility];  Service: Orthopedics;  Laterality: Left;  70 mins    FHx:    Reviewed / unchanged  SHx:    Reviewed / unchanged   Systems Review:  Constitutional: Denies fever, chills, wt changes, headaches, insomnia, fatigue, night sweats, change in appetite. Eyes: Denies redness, blurred vision, diplopia, discharge, itchy, watery eyes.  ENT: Denies discharge, congestion, post nasal drip, epistaxis, sore throat, earache, hearing loss, dental pain, tinnitus, vertigo, sinus pain, snoring.  CV: Denies chest pain, palpitations, irregular heartbeat, syncope, dyspnea, diaphoresis, orthopnea, PND, claudication or edema. Respiratory: denies cough, dyspnea, DOE, pleurisy, hoarseness, laryngitis, wheezing.  Gastrointestinal: Denies dysphagia, odynophagia, heartburn, reflux, water brash, abdominal pain or cramps, nausea, vomiting, bloating, diarrhea, constipation, hematemesis, melena, hematochezia  or hemorrhoids. Genitourinary: Denies dysuria, frequency, urgency, nocturia, hesitancy, discharge, hematuria or flank pain. Musculoskeletal: Denies arthralgias, myalgias, stiffness, jt. swelling, pain, limping or strain/sprain.  Skin: Denies pruritus, rash, hives, warts, acne, eczema or change in skin lesion(s). Neuro: No weakness, tremor, incoordination, spasms, paresthesia or pain. Psychiatric: Denies confusion, memory loss or sensory loss. Endo: Denies change in weight, skin or hair change.  Heme/Lymph: No excessive bleeding, bruising or enlarged lymph nodes.  Physical Exam  BP 138/80   Pulse 76   Temp 97.9 F (36.6 C)   Resp 17   Ht 5'  0.05" (1.525 m)   Wt 136 lb (61.7 kg)   SpO2 99%   BMI 26.52 kg/m   Appears  well nourished, well groomed  and in no distress.  Eyes: PERRLA, EOMs, conjunctiva no swelling or erythema. Sinuses: No frontal/maxillary tenderness ENT/Mouth: EAC's clear, TM's nl w/o erythema, bulging. Nares clear w/o erythema, swelling, exudates. Oropharynx clear without erythema or exudates. Oral hygiene is good. Tongue normal, non obstructing. Hearing intact.  Neck:  Supple. Thyroid not palpable. Car 2+/2+ without bruits, nodes or JVD. Chest: Respirations nl with BS clear & equal w/o rales, rhonchi, wheezing or stridor.  Cor: Heart sounds normal w/ regular rate and rhythm without sig. murmurs, gallops, clicks or rubs. Peripheral pulses normal and equal  without edema.  Abdomen: Soft & bowel sounds normal. Non-tender w/o guarding, rebound, hernias, masses or organomegaly.  Lymphatics: Unremarkable.  Musculoskeletal: Full ROM all peripheral extremities, joint stability, 5/5 strength and normal gait.  Skin: Warm, dry without exposed rashes, lesions or ecchymosis apparent.  Neuro: Cranial nerves intact, reflexes equal bilaterally. Sensory-motor testing grossly intact. Tendon reflexes grossly intact.  Pysch: Alert & oriented x 3.  Insight and judgement nl & appropriate. No ideations.  Assessment and Plan:   1. Labile hypertension  - Continue medication, monitor blood pressure at home.  - Continue DASH diet.  Reminder to go to the ER if any CP,  SOB, nausea, dizziness, severe HA, changes vision/speech.   - CBC with Differential/Platelet - COMPLETE METABOLIC PANEL WITH GFR - Magnesium - TSH   2. Hyperlipidemia, mixed   - Continue diet/meds, exercise  & lifestyle modifications.  - Continue monitor periodic cholesterol/liver & renal functions    - Lipid panel - TSH   3. Abnormal glucose  - Continue diet, exercise  - Lifestyle modifications.  - Monitor appropriate labs   - Hemoglobin A1c -  Insulin, random   4. Vitamin D deficiency   - Continue supplementation.   - VITAMIN D 25 Hydroxy    5. Hypothyroidism  - TSH   6. Medication management  - CBC with Differential/Platelet - COMPLETE METABOLIC PANEL WITH GFR - Magnesium - Lipid panel - TSH - Hemoglobin A1c - Insulin, random - VITAMIN D 25 Hydroxyl         Discussed  regular exercise, BP monitoring, weight control to achieve/maintain BMI less than 25 and discussed med and SE's. Recommended labs to assess and monitor clinical status with further disposition pending results of labs.  I discussed the assessment and treatment plan with the patient. The patient was provided an opportunity to ask questions and all were answered. The patient agreed with the plan and demonstrated an understanding of the instructions.  I provided over 30 minutes of exam, counseling, chart review and  complex critical decision making.         The patient was advised to call back or seek an in-person evaluation if the symptoms worsen or if the condition fails to improve as anticipated.   Marinus Maw, MD

## 2023-02-28 NOTE — Patient Instructions (Signed)

## 2023-03-01 LAB — HEMOGLOBIN A1C
Hgb A1c MFr Bld: 5.5 % of total Hgb (ref <5.7)
Mean Plasma Glucose: 111 mg/dL
eAG (mmol/L): 6.2 mmol/L

## 2023-03-01 LAB — LIPID PANEL
Cholesterol: 201 mg/dL — ABNORMAL HIGH (ref <200)
HDL: 70 mg/dL (ref >=50)
LDL Cholesterol (Calc): 110 mg/dL (calc) — ABNORMAL HIGH
Non-HDL Cholesterol (Calc): 131 mg/dL (calc) — ABNORMAL HIGH (ref <130)
Total CHOL/HDL Ratio: 2.9 (calc) (ref <5.0)
Triglycerides: 107 mg/dL (ref <150)

## 2023-03-01 LAB — COMPLETE METABOLIC PANEL WITH GFR
AG Ratio: 2 (calc) (ref 1.0–2.5)
ALT: 12 U/L (ref 6–29)
AST: 17 U/L (ref 10–35)
Albumin: 4.3 g/dL (ref 3.6–5.1)
Alkaline phosphatase (APISO): 54 U/L (ref 37–153)
BUN/Creatinine Ratio: 29 (calc) — ABNORMAL HIGH (ref 6–22)
BUN: 17 mg/dL (ref 7–25)
CO2: 26 mmol/L (ref 20–32)
Calcium: 9.1 mg/dL (ref 8.6–10.4)
Chloride: 107 mmol/L (ref 98–110)
Creat: 0.59 mg/dL — ABNORMAL LOW (ref 0.60–1.00)
Globulin: 2.2 g/dL (calc) (ref 1.9–3.7)
Glucose, Bld: 84 mg/dL (ref 65–99)
Potassium: 4.2 mmol/L (ref 3.5–5.3)
Sodium: 141 mmol/L (ref 135–146)
Total Bilirubin: 0.5 mg/dL (ref 0.2–1.2)
Total Protein: 6.5 g/dL (ref 6.1–8.1)
eGFR: 96 mL/min/{1.73_m2} (ref >=60)

## 2023-03-01 LAB — CBC WITH DIFFERENTIAL/PLATELET
Basophils Relative: 1.1 %
Eosinophils Absolute: 360 cells/uL (ref 15–500)
Eosinophils Relative: 8 %
Hemoglobin: 12.3 g/dL (ref 11.7–15.5)
Monocytes Relative: 8.4 %
Neutrophils Relative %: 38.5 %
Platelets: 338 10*3/uL (ref 140–400)
RBC: 4.2 10*6/uL (ref 3.80–5.10)
RDW: 13 % (ref 11.0–15.0)
Total Lymphocyte: 44 %
WBC: 4.5 10*3/uL (ref 3.8–10.8)

## 2023-03-01 LAB — VITAMIN D 25 HYDROXY (VIT D DEFICIENCY, FRACTURES): Vit D, 25-Hydroxy: 33 ng/mL (ref 30–100)

## 2023-03-01 LAB — INSULIN, RANDOM: Insulin: 6 u[IU]/mL

## 2023-03-01 LAB — TSH: TSH: 1.25 mIU/L (ref 0.40–4.50)

## 2023-03-01 LAB — MAGNESIUM: Magnesium: 1.8 mg/dL (ref 1.5–2.5)

## 2023-03-07 DIAGNOSIS — M47816 Spondylosis without myelopathy or radiculopathy, lumbar region: Secondary | ICD-10-CM | POA: Diagnosis not present

## 2023-03-22 ENCOUNTER — Ambulatory Visit: Payer: Medicare PPO | Admitting: Internal Medicine

## 2023-03-30 ENCOUNTER — Ambulatory Visit: Payer: Medicare PPO | Admitting: Nurse Practitioner

## 2023-03-30 ENCOUNTER — Encounter: Payer: Self-pay | Admitting: Nurse Practitioner

## 2023-03-30 ENCOUNTER — Ambulatory Visit: Payer: Medicare PPO | Admitting: Internal Medicine

## 2023-03-30 ENCOUNTER — Encounter: Payer: Self-pay | Admitting: Internal Medicine

## 2023-03-30 VITALS — BP 106/70 | HR 78 | Ht 60.0 in | Wt 133.0 lb

## 2023-03-30 VITALS — BP 114/74 | HR 87 | Temp 97.1°F | Ht 60.0 in | Wt 133.0 lb

## 2023-03-30 DIAGNOSIS — K449 Diaphragmatic hernia without obstruction or gangrene: Secondary | ICD-10-CM

## 2023-03-30 DIAGNOSIS — K2100 Gastro-esophageal reflux disease with esophagitis, without bleeding: Secondary | ICD-10-CM

## 2023-03-30 DIAGNOSIS — J0101 Acute recurrent maxillary sinusitis: Secondary | ICD-10-CM | POA: Diagnosis not present

## 2023-03-30 DIAGNOSIS — K2990 Gastroduodenitis, unspecified, without bleeding: Secondary | ICD-10-CM

## 2023-03-30 DIAGNOSIS — R519 Headache, unspecified: Secondary | ICD-10-CM

## 2023-03-30 DIAGNOSIS — D508 Other iron deficiency anemias: Secondary | ICD-10-CM

## 2023-03-30 DIAGNOSIS — K2970 Gastritis, unspecified, without bleeding: Secondary | ICD-10-CM | POA: Diagnosis not present

## 2023-03-30 MED ORDER — PREDNISONE 10 MG PO TABS
ORAL_TABLET | ORAL | 0 refills | Status: DC
Start: 2023-03-30 — End: 2023-04-17

## 2023-03-30 NOTE — Progress Notes (Signed)
Assessment and Plan:  Monique Thomas was seen today for an episodic visit.  Diagnoses and all order for this visit:  1. Acute recurrent maxillary sinusitis Start steroid taper Stay well hydrated to keep mucus thin and productive Continue daily antihistamine Contact office if s/s fail to improve.  - predniSONE (DELTASONE) 10 MG tablet; 1 tab 3 x day for 2 days, then 1 tab 2 x day for 2 days, then 1 tab 1 x day for 3 days  Dispense: 13 tablet; Refill: 0  2. Sinus headache OTC Tylenol as needed Stay well hydrated Continue to monitor  - predniSONE (DELTASONE) 10 MG tablet; 1 tab 3 x day for 2 days, then 1 tab 2 x day for 2 days, then 1 tab 1 x day for 3 days  Dispense: 13 tablet; Refill: 0   Continue to monitor for any increase in fever, chills, N/V, diarrhea, changes to bowel habits, blood in stool.  Notify office for further evaluation and treatment, questions or concerns if s/s fail to improve. The risks and benefits of my recommendations, as well as other treatment options were discussed with the patient today. Questions were answered.  Further disposition pending results of labs. Discussed med's effects and SE's.    Over 15 minutes of exam, counseling, chart review, and critical decision making was performed.   Future Appointments  Date Time Provider Department Center  05/31/2023 10:00 AM Raynelle Dick, NP [facility] None  07/27/2023 10:00 AM Selmer Dominion, NP [facility]  09/25/2023 11:00 AM Raynelle Dick, NP [facility] None    ------------------------------------------------------------------------------------------------------------------   HPI BP 114/74   Pulse 87   Temp (!) 97.1 F (36.2 C)   Ht 5' (1.524 m)   Wt 133 lb (60.3 kg)   SpO2 97%   BMI 25.97 kg/m    Patient complains of possible sinusitis. Symptoms include congestion, facial pain, headache described as throbbing, and sinus pressure. Onset of symptoms was 5 days ago, and has been unchanged since  that time. Treatment to date: antihistamines and decongestants.  Denies fever, chills, N/V.  Denies exposures to other persons or recent travel.   Past Medical History:  Diagnosis Date   Anxiety    Arthritis    Back pain, chronic    Depression    GERD (gastroesophageal reflux disease)    History of kidney stones 2015   Hyperlipidemia    Hypothyroidism    Skin abnormality 12/07/2021   face red with 2 blisters intact on forehead due to tolak crean tx for skin cancer   Skin Cancer (HCC)    SUI (stress urinary incontinence, female)    wears pads   Thyroid disease    TMJ (temporomandibular joint disorder)    joint has dislocated x 3 in past     Allergies  Allergen Reactions   Celebrex [Celecoxib] Other (See Comments)    "panic attack"   Other Other (See Comments)    Skin bruises very easily and peels back   Topiramate Other (See Comments)    Agitation   Gabapentin     sedation    Lyrica [Pregabalin]     sedation   Zofran [Ondansetron] Other (See Comments)    Headache   Entex Lq [Phenylephrine-Guaifenesin] Rash   Fiorinal [Butalbital-Aspirin-Caffeine] Rash    Current Outpatient Medications on File Prior to Visit  Medication Sig   ALPRAZolam (XANAX) 1 MG tablet TAKE 1/2 TO 1 TABLET BY MOUTH EVERY NIGHT AT BEDTIME. LIMIT TO 5 DAYS PER  WEEK TO AVOID ADDICTION (Patient taking differently: Take 1 mg by mouth at bedtime as needed. TAKE 1/2 TO 1 TABLET BY MOUTH EVERY NIGHT AT BEDTIME. LIMIT TO 5 DAYS PER WEEK TO AVOID ADDICTION)   Cholecalciferol (VITAMIN D) 125 MCG (5000 UT) CAPS Take by mouth.   estradiol (ESTRACE) 1 MG tablet Take 1 mg by mouth daily.   Ferrous Sulfate (IRON) 325 (65 Fe) MG TABS Take by mouth daily.   GEMTESA 75 MG TABS Take by mouth.   levothyroxine (SYNTHROID) 88 MCG tablet Take 1 tablet daily except 1/2 tab on Tues/Sat for thyroid on empty stomach with water only.   lidocaine (LIDODERM) 5 % Place 1 patch onto the skin daily.   naproxen sodium (ALEVE) 220 MG  tablet Take 2 tabs twice daily with food as needed for back pain.   pantoprazole (PROTONIX) 40 MG tablet Take 1 tablet (40 mg total) by mouth daily.   No current facility-administered medications on file prior to visit.    ROS: all negative except what is noted in the HPI.   Physical Exam:  BP 114/74   Pulse 87   Temp (!) 97.1 F (36.2 C)   Ht 5' (1.524 m)   Wt 133 lb (60.3 kg)   SpO2 97%   BMI 25.97 kg/m   General Appearance: NAD.  Awake, conversant and cooperative. Eyes: PERRLA, EOMs intact.  Sclera white.  Conjunctiva without erythema. Sinuses: Frontal/maxillary tenderness.  No nasal discharge. Nares patent.  ENT/Mouth: Ext aud canals clear.  Bilateral TMs w/DOL and without erythema or bulging. Hearing intact.  Posterior pharynx without swelling or exudate.  Tonsils without swelling or erythema.  Neck: Supple.  No masses, nodules or thyromegaly. Respiratory: Effort is regular with non-labored breathing. Breath sounds are equal bilaterally without rales, rhonchi, wheezing or stridor.  Cardio: RRR with no MRGs. Brisk peripheral pulses without edema.  Abdomen: Active BS in all four quadrants.  Soft and non-tender without guarding, rebound tenderness, hernias or masses. Lymphatics: Non tender without lymphadenopathy.  Musculoskeletal: Full ROM, 5/5 strength, normal ambulation.  No clubbing or cyanosis. Skin: Appropriate color for ethnicity. Warm without rashes, lesions, ecchymosis, ulcers.  Neuro: CN II-XII grossly normal. Normal muscle tone without cerebellar symptoms and intact sensation.   Psych: AO X 3,  appropriate mood and affect, insight and judgment.    Adela Glimpse, NP 4:04 PM [facility]

## 2023-03-30 NOTE — Progress Notes (Signed)
Monique Thomas 72 y.o. Jul 25, 1951 [id number]  Assessment & Plan:   Encounter Diagnoses  Name Primary?   Hiatal hernia Yes   Gastritis and gastroduodenitis    Gastroesophageal reflux disease with esophagitis without hemorrhage    Other iron deficiency anemia     Continue current therapy with pantoprazole, iron repletion.  She will follow-up with primary care.  Regarding her concerns about the frog in her throat perhaps she is releasing some gastric air which may be more likely in the setting of a 6 cm hiatal hernia.  I explained how this is not a health hazard and this reassured her.  She is comfortable with following up as needed.  CC: Lucky Cowboy, MD     Subjective:   Chief Complaint: GERD, "frog in my throat still"  HPI 72 year old white woman recently evaluated with EGD and colonoscopy as resulted below, because of iron deficiency anemia.  No clear cause found.  She complains of a "frog in my throat".  She does not have heartburn or indigestion and esophagus did not show esophagitis at EGD while on PPI.  She says sometimes she has a gurgling almost like a real bit of a problem when she is swallowing for eating at times.  This does not really bother her but she was concerned that it represented a problem.   Findings:                 The examined esophagus was mildly tortuous.                           A 6 cm hiatal hernia was present.                           Diffuse moderate inflammation characterized by                            congestion (edema), erythema and friability was                            found in the gastric body and in the gastric                            antrum. Biopsies were taken with a cold forceps for                            histology. (Chronic inactive gastritis)Verification of patient identification                            for the specimen was done. Estimated blood loss was                            minimal.                            The cardia and gastric fundus were normal on                            retroflexion.  The gastroesophageal flap valve was visualized                            endoscopically and classified as Hill Grade IV (no                            fold, wide open lumen, hiatal hernia present).                           The examined duodenum was normal Impression:               - Diverticulosis in the sigmoid colon.                           - The examination was otherwise normal on direct                            and retroflexion views.                           - No specimens collected. No cause of iron                            deficinecy seen Allergies  Allergen Reactions   Celebrex [Celecoxib] Other (See Comments)    "panic attack"   Other Other (See Comments)    Skin bruises very easily and peels back   Topiramate Other (See Comments)    Agitation   Gabapentin     sedation    Lyrica [Pregabalin]     sedation   Zofran [Ondansetron] Other (See Comments)    Headache   Entex Lq [Phenylephrine-Guaifenesin] Rash   Fiorinal [Butalbital-Aspirin-Caffeine] Rash   Current Meds  Medication Sig   ALPRAZolam (XANAX) 1 MG tablet TAKE 1/2 TO 1 TABLET BY MOUTH EVERY NIGHT AT BEDTIME. LIMIT TO 5 DAYS PER WEEK TO AVOID ADDICTION (Patient taking differently: Take 1 mg by mouth at bedtime as needed. TAKE 1/2 TO 1 TABLET BY MOUTH EVERY NIGHT AT BEDTIME. LIMIT TO 5 DAYS PER WEEK TO AVOID ADDICTION)   Cholecalciferol (VITAMIN D) 125 MCG (5000 UT) CAPS Take by mouth.   estradiol (ESTRACE) 1 MG tablet Take 1 mg by mouth daily.   GEMTESA 75 MG TABS Take by mouth.   levothyroxine (SYNTHROID) 88 MCG tablet Take 1 tablet daily except 1/2 tab on Tues/Sat for thyroid on empty stomach with water only.   naproxen sodium (ALEVE) 220 MG tablet Take 2 tabs twice daily with food as needed for back pain.   pantoprazole (PROTONIX) 40 MG tablet Take 1 tablet (40 mg total) by mouth daily.   Past  Medical History:  Diagnosis Date   Anxiety    Arthritis    Back pain, chronic    Depression    GERD (gastroesophageal reflux disease)    History of kidney stones 2015   Hyperlipidemia    Hypothyroidism    Skin abnormality 12/07/2021   face red with 2 blisters intact on forehead due to tolak crean tx for skin cancer   Skin Cancer (HCC)    SUI (stress urinary incontinence, female)    wears pads   Thyroid disease    TMJ (temporomandibular joint disorder)  joint has dislocated x 3 in past   Past Surgical History:  Procedure Laterality Date   ABDOMINAL HYSTERECTOMY     age 7, 1 ovary reved at surgery 1 ovary removed later   ANTERIOR LAT LUMBAR FUSION N/A 04/07/2017   Procedure: LUMBAR ONE-TWO ANTERIOR LATERAL LUMBAR FUSION WITH LATERAL PLATE;  Surgeon: Tia Alert, MD;  Location: [facility] OR;  Service: Neurosurgery;  Laterality: N/A;  Right side approach   APPENDECTOMY     age 6   BLADDER SUSPENSION  2013   BLADDER SUSPENSION N/A 12/09/2021   Procedure: TRANSVAGINAL TAPE (TVT) PROCEDURE;  Surgeon: Marguerita Beards, MD;  Location: [facility];  Service: Gynecology;  Laterality: N/A;   CESAREAN SECTION     x 2   CYSTOSCOPY N/A 12/09/2021   Procedure: CYSTOSCOPY;  Surgeon: Marguerita Beards, MD;  Location: [facility];  Service: Gynecology;  Laterality: N/A;   EXCISION OF MESH N/A 03/14/2022   Procedure: Cleon Dew REVISION;  Surgeon: Marguerita Beards, MD;  Location: [facility];  Service: Gynecology;  Laterality: N/A;  total time requested is 1 hour   EXTRACORPOREAL SHOCK WAVE LITHOTRIPSY  2015   HERNIA REPAIR     umbilical yrs ago   LAPAROSCOPIC LYSIS INTESTINAL ADHESIONS     yrs ago   MAXIMUM ACCESS (MAS)POSTERIOR LUMBAR INTERBODY FUSION (PLIF) 1 LEVEL N/A 04/16/2015   Procedure: masPLIF  - L2-L3 ;  Surgeon: Tia Alert, MD;  Location: [facility];  Service: Neurosurgery;  Laterality: N/A;  masPLIF  - L2-L3    SPINE SURGERY      lumbar before 2016 surgery   TONSILLECTOMY AND ADENOIDECTOMY     age 73   TOTAL KNEE ARTHROPLASTY Left 07/17/2018   Procedure: LEFT TOTAL KNEE ARTHROPLASTY;  Surgeon: Durene Romans, MD;  Location: [facility];  Service: Orthopedics;  Laterality: Left;  70 mins   Social History   Social History Narrative   Patient is married   No alcohol tobacco or drug use   family history includes Asthma in her sister; Cancer in her maternal grandmother; Heart disease in her mother; Hyperlipidemia in her mother; Thyroid disease in her mother.   Review of Systems As above  Objective:   Physical Exam BP 106/70   Pulse 78   Ht 5' (1.524 m)   Wt 133 lb (60.3 kg)   BMI 25.97 kg/m

## 2023-03-30 NOTE — Patient Instructions (Signed)

## 2023-03-30 NOTE — Patient Instructions (Signed)
As we discussed things look ok inside - old gastritis and the hiatal hernia were main findings.  The frog in your throat is likely some air escaping the stomach and not a health hazard.  I appreciate the opportunity to care for you. Iva Boop, MD, [facility]

## 2023-04-06 ENCOUNTER — Other Ambulatory Visit: Payer: Self-pay | Admitting: Physician Assistant

## 2023-04-06 DIAGNOSIS — K2100 Gastro-esophageal reflux disease with esophagitis, without bleeding: Secondary | ICD-10-CM

## 2023-04-10 DIAGNOSIS — M47816 Spondylosis without myelopathy or radiculopathy, lumbar region: Secondary | ICD-10-CM | POA: Diagnosis not present

## 2023-04-12 ENCOUNTER — Other Ambulatory Visit: Payer: Self-pay | Admitting: Nurse Practitioner

## 2023-04-12 DIAGNOSIS — G2581 Restless legs syndrome: Secondary | ICD-10-CM

## 2023-04-12 DIAGNOSIS — G8929 Other chronic pain: Secondary | ICD-10-CM

## 2023-04-17 ENCOUNTER — Telehealth: Payer: Self-pay | Admitting: Nurse Practitioner

## 2023-04-17 ENCOUNTER — Other Ambulatory Visit: Payer: Self-pay | Admitting: Nurse Practitioner

## 2023-04-17 DIAGNOSIS — J0101 Acute recurrent maxillary sinusitis: Secondary | ICD-10-CM

## 2023-04-17 DIAGNOSIS — R519 Headache, unspecified: Secondary | ICD-10-CM

## 2023-04-17 MED ORDER — DOXYCYCLINE HYCLATE 100 MG PO TABS: 100.0000 mg | ORAL_TABLET | Freq: Two times a day (BID) | ORAL | 0 refills | Status: AC

## 2023-04-17 MED ORDER — PREDNISONE 10 MG PO TABS
ORAL_TABLET | ORAL | 0 refills | Status: DC
Start: 2023-04-17 — End: 2023-05-30

## 2023-04-17 MED ORDER — AZITHROMYCIN 250 MG PO TABS: ORAL_TABLET | ORAL | 1 refills | Status: DC

## 2023-04-17 NOTE — Telephone Encounter (Signed)
Provider sent in Z-Pac for pt, but pt called saying the Z-pac usually does not work for her... could you send an alternative?

## 2023-04-17 NOTE — Telephone Encounter (Signed)
Sinus infection has not improved... Pt is wondering if you would send her in another round of prednisone and an antibiotic? If so, pls send to [facility] in [place].

## 2023-05-02 ENCOUNTER — Other Ambulatory Visit (HOSPITAL_COMMUNITY)
Admission: RE | Admit: 2023-05-02 | Discharge: 2023-05-02 | Disposition: A | Discharge status: Home / Self Care | Payer: Medicare PPO | Source: Other Acute Inpatient Hospital | Attending: Obstetrics and Gynecology | Admitting: Obstetrics and Gynecology

## 2023-05-02 ENCOUNTER — Ambulatory Visit (INDEPENDENT_AMBULATORY_CARE_PROVIDER_SITE_OTHER): Payer: Medicare PPO

## 2023-05-02 VITALS — BP 143/59 | HR 68

## 2023-05-02 DIAGNOSIS — R82998 Other abnormal findings in urine: Secondary | ICD-10-CM | POA: Diagnosis not present

## 2023-05-02 DIAGNOSIS — R350 Frequency of micturition: Secondary | ICD-10-CM

## 2023-05-02 LAB — POCT URINALYSIS DIPSTICK
Bilirubin, UA: NEGATIVE
Blood, UA: NEGATIVE
Glucose, UA: NEGATIVE
Ketones, UA: NEGATIVE
Nitrite, UA: NEGATIVE
Protein, UA: NEGATIVE
Spec Grav, UA: >=1.03 — AB (ref 1.010–1.025)
Urobilinogen, UA: 0.2 E.U./dL
pH, UA: 6.5 (ref 5.0–8.0)

## 2023-05-02 MED ORDER — SULFAMETHOXAZOLE-TRIMETHOPRIM 800-160 MG PO TABS: 1.0000 | ORAL_TABLET | Freq: Two times a day (BID) | ORAL | 0 refills | Status: DC

## 2023-05-02 MED ORDER — FLUCONAZOLE 150 MG PO TABS: 150.0000 mg | ORAL_TABLET | Freq: Once | ORAL | 0 refills | Status: AC

## 2023-05-02 NOTE — Patient Instructions (Signed)
Your Urine dip that was done in office was Positive. I am sending the urine off for culture and you can take AZO over the counter for your discomfort.  We have ordered Bactrim for you to take while we wait for your culture results, hopefully this gives you some relief. We also ordered Diflucan for yeast after you take the Bactrim. We will contact you when the results are back between 3-5 days. If a different antibiotic is needed we will sent the order to the pharmacy and you will be notified. If you have any questions or concerns please feel free to call us at [phone]

## 2023-05-22 ENCOUNTER — Other Ambulatory Visit: Payer: Self-pay | Admitting: Obstetrics and Gynecology

## 2023-05-29 ENCOUNTER — Ambulatory Visit: Payer: Medicare PPO | Admitting: Nurse Practitioner

## 2023-05-30 ENCOUNTER — Encounter: Payer: Self-pay | Admitting: Nurse Practitioner

## 2023-05-30 ENCOUNTER — Ambulatory Visit (INDEPENDENT_AMBULATORY_CARE_PROVIDER_SITE_OTHER): Payer: Medicare PPO | Admitting: Nurse Practitioner

## 2023-05-30 VITALS — BP 140/72 | HR 58 | Temp 97.8°F | Ht 59.5 in | Wt 132.8 lb

## 2023-05-30 DIAGNOSIS — Z1389 Encounter for screening for other disorder: Secondary | ICD-10-CM

## 2023-05-30 DIAGNOSIS — R7309 Other abnormal glucose: Secondary | ICD-10-CM | POA: Diagnosis not present

## 2023-05-30 DIAGNOSIS — Z79899 Other long term (current) drug therapy: Secondary | ICD-10-CM | POA: Diagnosis not present

## 2023-05-30 DIAGNOSIS — E782 Mixed hyperlipidemia: Secondary | ICD-10-CM

## 2023-05-30 DIAGNOSIS — D509 Iron deficiency anemia, unspecified: Secondary | ICD-10-CM | POA: Diagnosis not present

## 2023-05-30 DIAGNOSIS — Z0001 Encounter for general adult medical examination with abnormal findings: Secondary | ICD-10-CM

## 2023-05-30 DIAGNOSIS — M5442 Lumbago with sciatica, left side: Secondary | ICD-10-CM

## 2023-05-30 DIAGNOSIS — M5416 Radiculopathy, lumbar region: Secondary | ICD-10-CM | POA: Diagnosis not present

## 2023-05-30 DIAGNOSIS — Z136 Encounter for screening for cardiovascular disorders: Secondary | ICD-10-CM | POA: Diagnosis not present

## 2023-05-30 DIAGNOSIS — G2581 Restless legs syndrome: Secondary | ICD-10-CM

## 2023-05-30 DIAGNOSIS — E559 Vitamin D deficiency, unspecified: Secondary | ICD-10-CM

## 2023-05-30 DIAGNOSIS — E538 Deficiency of other specified B group vitamins: Secondary | ICD-10-CM

## 2023-05-30 DIAGNOSIS — G894 Chronic pain syndrome: Secondary | ICD-10-CM

## 2023-05-30 DIAGNOSIS — Z130 Encounter for screening for diseases of the blood and blood-forming organs and certain disorders involving the immune mechanism: Secondary | ICD-10-CM

## 2023-05-30 DIAGNOSIS — N393 Stress incontinence (female) (male): Secondary | ICD-10-CM

## 2023-05-30 DIAGNOSIS — Z0000 Encounter for general adult medical examination without abnormal findings: Secondary | ICD-10-CM

## 2023-05-30 DIAGNOSIS — I700 Atherosclerosis of aorta: Secondary | ICD-10-CM

## 2023-05-30 DIAGNOSIS — E039 Hypothyroidism, unspecified: Secondary | ICD-10-CM | POA: Diagnosis not present

## 2023-05-30 DIAGNOSIS — Z6825 Body mass index (BMI) 25.0-25.9, adult: Secondary | ICD-10-CM

## 2023-05-30 DIAGNOSIS — R0989 Other specified symptoms and signs involving the circulatory and respiratory systems: Secondary | ICD-10-CM | POA: Diagnosis not present

## 2023-05-30 DIAGNOSIS — Z23 Encounter for immunization: Secondary | ICD-10-CM | POA: Diagnosis not present

## 2023-05-30 DIAGNOSIS — F419 Anxiety disorder, unspecified: Secondary | ICD-10-CM

## 2023-05-30 DIAGNOSIS — K2100 Gastro-esophageal reflux disease with esophagitis, without bleeding: Secondary | ICD-10-CM | POA: Diagnosis not present

## 2023-05-30 DIAGNOSIS — G8929 Other chronic pain: Secondary | ICD-10-CM

## 2023-05-30 DIAGNOSIS — D649 Anemia, unspecified: Secondary | ICD-10-CM

## 2023-05-30 DIAGNOSIS — M85852 Other specified disorders of bone density and structure, left thigh: Secondary | ICD-10-CM

## 2023-05-30 MED ORDER — DEXAMETHASONE 4 MG PO TABS: ORAL_TABLET | ORAL | 0 refills | Status: DC

## 2023-05-30 MED ORDER — DEXAMETHASONE SODIUM PHOSPHATE 10 MG/ML IJ SOLN
10.0000 mg | Freq: Once | INTRAMUSCULAR | Status: AC
Start: 2023-05-30 — End: 2023-05-30
  Administered 2023-05-30: 10 mg via INTRAMUSCULAR

## 2023-05-30 NOTE — Addendum Note (Signed)
Addended by: Adela Glimpse on: 05/30/2023 11:29 AM   Modules accepted: Orders

## 2023-05-30 NOTE — Progress Notes (Signed)
CPE  Assessment:   CPE Due annually  Health maintenance reviewed Healthily lifestyle goals set   Aortic atherosclerosis (HCC) CT 09/25/19 Control blood pressure, lipids and glucose Disscused lifestyle modifications, diet & exercise Continue to monitor  Labile hypertension Discussed DASH (Dietary Approaches to Stop Hypertension) DASH diet is lower in sodium than a typical American diet. Cut back on foods that are high in saturated fat, cholesterol, and trans fats. Eat more whole-grain foods, fish, poultry, and nuts Remain active and exercise as tolerated daily.  Monitor BP at home-Call if greater than 130/80.   Hyperlipidemia, mixed Discussed lifestyle modifications. Recommended diet heavy in fruits and veggies, omega 3's. Decrease consumption of animal meats, cheeses, and dairy products. Remain active and exercise as tolerated. Continue to monitor. Check lipids/TSH  Gastroesophageal reflux disease with esophagitis without hemorrhage/ Possible hiatal hernia UTD on EGD and Colonoscopy  No suspected reflux complications (Barret/stricture). Lifestyle modification:  wt loss, avoid meals 2-3h before bedtime. Consider eliminating food triggers:  chocolate, caffeine, EtOH, acid/spicy food.  Vitamin D deficiency Continue supplement for goal of 60-100 Monitor Vitamin D levels  S/P lumbar fusion/left sciatica pain Chronic pain syndrome Dexamethasone injection administered today - tolerated well. Decadron taper pack sent to start 3 days from now. Continue to follow with pain mgt apt scheduled 06/08/23 Continue topical/oral NSAIDs, poorly tolerated cymbalta  Abnormal glucose Education: Reviewed 'ABCs' of diabetes management  Discussed goals to be met and/or maintained include A1C (<7) Blood pressure (<130/80) Cholesterol (LDL <70) Continue Eye Exam yearly  Continue Dental Exam Q6 mo Discussed dietary recommendations Discussed Physical Activity recommendations Check  A1C  Hypothyroidism Controlled. Continue Levothyroxine. Reminded to take on an empty stomach 30-62mins before food.  Stop any Biotin Supplement 48-72 hours before next TSH level to reduce the risk of falsely low TSH levels. Continue to monitor.    Anxiety Continue medications - rare use of Xanax Reviewed relaxation techniques.  Sleep hygiene. Recommended Cognitive Behavioral Therapy (CBT). Recommended mindfulness meditation and exercise.   Insight-oriented psychotherapy given for 16 minutes exclusively. Psychoeducation:  encouraged personality growth wand development through coping techniques and problem-solving skills. Limit/Decrease/Monitor drug/alcohol intake.     Osteopenia of left hip DEXA due 09/2023 Pursue a combination of weight-bearing exercises and strength training. Advised on fall prevention measures including proper lighting in all rooms, removal of area rugs and floor clutter, use of walking devices as deemed appropriate, avoidance of uneven walking surfaces. Smoking cessation and moderate alcohol consumption if applicable Consume 800 to 1000 IU of vitamin D daily with a goal vitamin D serum value of 30 ng/mL or higher. Aim for 1000 to 1200 mg of elemental calcium daily through supplements and/or dietary sources.  Restless legs Remain active Continue Requip and Topamax  B12 deficiency Often elevated >2000 Suggested restarting every other day supplement once labs confirmed  BMI 25.0-25.9 Discussed appropriate BMI Diet modification. Physical activity. Encouraged/praised to build confidence.  Medication management All medications discussed and reviewed in full. All questions and concerns regarding medications addressed.    Stress Incontinence Continue Gemtesa Continue to follow with Dr Florian Buff, urogynecology  Screening for blood or protein in urine Check and monitor Urinalysis/Microalbumin Routine w reflex microscopic  Screening for anemia Monitor  iron, ferritin, TIBC  Orders Placed This Encounter  Procedures   CBC with Differential/Platelet   COMPLETE METABOLIC PANEL WITH GFR   Magnesium   Lipid panel   TSH   Hemoglobin A1c   Insulin, random   VITAMIN D 25 Hydroxy (Vit-D Deficiency, Fractures)  Urinalysis, Routine w reflex microscopic   Microalbumin / creatinine urine ratio   Vitamin B12   Iron, TIBC and Ferritin Panel   EKG 12-Lead   Meds ordered this encounter  Medications   dexamethasone (DECADRON) injection 10 mg   dexamethasone (DECADRON) 4 MG tablet    Sig: Take 1 tab 3 x /day for 2 days, then 2 x /day for 2  Days,  then 1 tab daily    Dispense:  13 tablet    Refill:  0    Order Specific Question:   Supervising Provider    Answer:   Lucky Cowboy [phone]    Future Appointments  Date Time Provider Department Center  07/27/2023 10:00 AM Selmer Dominion, NP [facility]  09/25/2023 11:00 AM Raynelle Dick, NP [facility] None  05/30/2024 10:00 AM Raynelle Dick, NP [facility] None      Subjective: HPI  Monique Thomas is a 72 y.o. female who presents for a general follow up. She has Back pain, chronic; Hyperlipidemia; Hypothyroidism; GERD (gastroesophageal reflux disease); Anxiety; Chronic pain syndrome; Restless legs; Aortic atherosclerosis (HCC) by Abd CT Scan 09/25/2019; Diverticulosis; TMJ tenderness, bilateral; Arthropathy of lumbar facet joint; Candidal vulvovaginitis; Decreased estrogen level; Disorder of sacrum; Displacement of lumbar intervertebral disc without myelopathy; Lumbar radiculopathy; Pain in left knee; Pain in left leg; Sacroiliac joint pain; Urinary tract infectious disease; Menopausal symptom; Disorder of both sacroiliac joints; Right wrist pain; Chronic low back pain; Pain in joint of left knee; Pain of right thumb; and Osteoarthritis of left knee on their problem list.  She got married in 2019, has a grown daughter, son was murdered. She does had 2 bio grandkids. She is retired from Armed forces operational officer at [employer].   She is followed by [facility]. She is s/p hysterectomy, on estrace 1 mg via GYN. Hx of bladder sling in 2004, has done pelvic PT. She had bladder sling 12/15/21 with urogyn- the sling is not adhering to one side- taking Estrace and Gemtesa. Followed now by Urogynecology Dr. Florian Buff, last visit 07/26/22. She had sling revision on 02/22/22.   She has seen ortho/neurosurgeon for chronic back pain (arthritis, reports had MRI neg for impingement) persistent R leg and knee following replacement in 2019, has seen ortho, had injections, done PT without benefit. NSAIDs do help but trying to limit use, uses topicals.  She is experiencing a flare today with increase in pain over the last month.  Difficulty walking. Tylenol not helpful. She has tried TENS unit, interested in [place], can get with [facility], requests letter. She has apt schedule with pain management 06/08/23.  she has a diagnosis of anxiety and is currently on xanax 1 mg, reports symptoms are well controlled on current regimen. she currently takes 1/2 tab at night to help with sleep/restless legs, has been tapering down and reducing use.  BMI is Body mass index is 26.37 kg/m., she has been working on diet and exercise, walks to get 10000 steps by tracker daily.  Wt Readings from Last 3 Encounters:  05/30/23 132 lb 12.8 oz (60.2 kg)  03/30/23 133 lb (60.3 kg)  03/30/23 133 lb (60.3 kg)   She does not have htn. Today their BP is BP: (!) 140/72  BP Readings from Last 3 Encounters:  05/30/23 (!) 140/72  05/02/23 (!) 143/59  03/30/23 114/74  She does workout. She denies chest pain, shortness of breath, dizziness.   She has aortic atherosclerosis per CT 09/2019.   She is  not on cholesterol medication and denies myalgias. Her cholesterol is at goal. The cholesterol last visit was:   Lab Results  Component Value Date   CHOL 201 (H) 02/28/2023   HDL 70 02/28/2023   LDLCALC 110 (H) 02/28/2023   TRIG 107 02/28/2023    CHOLHDL 2.9 02/28/2023    She has been working on diet and exercise for glucose management, and denies foot ulcerations, increased appetite, nausea, paresthesia of the feet, polydipsia, polyuria, visual disturbances, vomiting and weight loss. Last A1C in the office was:  Lab Results  Component Value Date   HGBA1C 5.5 02/28/2023   She is on thyroid medication. Her medication was changed last visit, reduced from 88 mcg daily to 1/2 tab twice a week and whole tab other days.  Lab Results  Component Value Date   TSH 1.25 02/28/2023   Last GFR: Lab Results  Component Value Date   EGFR 96 02/28/2023    Patient is on Vitamin D supplement  Lab Results  Component Value Date   VD25OH 33 02/28/2023         Medication Review: Current Outpatient Medications on File Prior to Visit  Medication Sig Dispense Refill   ALPRAZolam (XANAX) 1 MG tablet TAKE 1/2 TO 1 TABLET BY MOUTH EVERY NIGHT AT BEDTIME. LIMIT TO 5 DAYS PER WEEK TO AVOID ADDICTION (Patient taking differently: Take 1 mg by mouth at bedtime as needed. TAKE 1/2 TO 1 TABLET BY MOUTH EVERY NIGHT AT BEDTIME. LIMIT TO 5 DAYS PER WEEK TO AVOID ADDICTION) 30 tablet 0   Cholecalciferol (VITAMIN D) 125 MCG (5000 UT) CAPS Take by mouth.     estradiol (ESTRACE) 1 MG tablet Take 1 mg by mouth daily.     GEMTESA 75 MG TABS Take by mouth.     levothyroxine (SYNTHROID) 88 MCG tablet Take 1 tablet daily except 1/2 tab on Tues/Sat for thyroid on empty stomach with water only. 30 tablet 11   lidocaine (LIDODERM) 5 % Place 1 patch onto the skin daily.     naproxen sodium (ALEVE) 220 MG tablet Take 2 tabs twice daily with food as needed for back pain.     pantoprazole (PROTONIX) 40 MG tablet TAKE 1 TABLET(40 MG) BY MOUTH DAILY 90 tablet 0   rOPINIRole (REQUIP) 0.5 MG tablet TAKE 1/2 TO 1 TABLET BY MOUTH THREE TIMES DAILY AS NEEDED FOR RESTLESS LEGS 270 tablet 1   azithromycin (ZITHROMAX) 250 MG tablet Take 2 tablets on  Day 1,  followed by 1 tablet   daily for 4 more days    for Sinusitis  /Bronchitis 6 each 1   Ferrous Sulfate (IRON) 325 (65 Fe) MG TABS Take by mouth daily.     predniSONE (DELTASONE) 10 MG tablet 1 tab 3 x day for 2 days, then 1 tab 2 x day for 2 days, then 1 tab 1 x day for 3 days 13 tablet 0   sulfamethoxazole-trimethoprim (BACTRIM DS) 800-160 MG tablet Take 1 tablet by mouth 2 (two) times daily. 6 tablet 0   No current facility-administered medications on file prior to visit.    Allergies  Allergen Reactions   Celebrex [Celecoxib] Other (See Comments)    "panic attack"   Other Other (See Comments)    Skin bruises very easily and peels back   Topiramate Other (See Comments)    Agitation   Gabapentin     sedation    Lyrica [Pregabalin]     sedation   Zofran [Ondansetron] Other (  See Comments)    Headache   Entex Lq [Phenylephrine-Guaifenesin] Rash   Fiorinal [Butalbital-Aspirin-Caffeine] Rash    Current Problems (verified) Patient Active Problem List   Diagnosis Date Noted   Arthropathy of lumbar facet joint 01/20/2023   Candidal vulvovaginitis 01/20/2023   Urinary tract infectious disease 01/20/2023   Menopausal symptom 01/20/2023   Pain in joint of left knee 05/03/2022   Pain of right thumb 11/08/2021   TMJ tenderness, bilateral 09/28/2021   Aortic atherosclerosis (HCC) by Abd CT Scan 09/25/2019 09/25/2019   Diverticulosis 09/25/2019   Osteoarthritis of left knee 03/20/2018   Restless legs 02/06/2018   Pain in left knee 11/17/2017   Right wrist pain 11/17/2017   Decreased estrogen level 01/09/2017   Pain in left leg 04/09/2015   Chronic pain syndrome 03/27/2015   Displacement of lumbar intervertebral disc without myelopathy 05/12/2014   Lumbar radiculopathy 05/12/2014   Sacroiliac joint pain 05/12/2014   Chronic low back pain 05/12/2014   Back pain, chronic    Hyperlipidemia    Hypothyroidism    GERD (gastroesophageal reflux disease)    Anxiety    Disorder of sacrum 04/03/2013   Disorder  of both sacroiliac joints 04/03/2013    Screening Tests Immunization History  Administered Date(s) Administered   Influenza Split 06/29/2015   Influenza Whole 05/21/2013   Influenza, High Dose Seasonal PF 05/30/2016, 06/13/2017, 06/18/2018, 06/24/2019, 07/02/2020, 06/30/2021, 07/21/2022   Influenza,inj,quad, With Preservative 06/19/2017   Influenza-Unspecified 06/09/2014, 06/20/2015   Janssen (J&J) SARS-COV-2 Vaccination 02/19/2020   PPD Test 09/23/2013, 10/01/2014, 02/26/2016   Pneumococcal Conjugate-13 05/03/2017   Pneumococcal Polysaccharide-23 09/23/2013   Pneumococcal-Unspecified 06/19/2017   Td 09/19/2005   Tdap 10/07/2015   Zoster Recombinant(Shingrix) 12/03/2020, 02/19/2021   Zoster, Live 03/27/2014   Health Maintenance  Topic Date Due   COVID-19 Vaccine (2 - Janssen risk series) 03/18/2020   INFLUENZA VACCINE  04/20/2023   MAMMOGRAM  09/16/2023   Medicare Annual Wellness (AWV)  11/23/2023   Fecal DNA (Cologuard)  08/02/2024   DTaP/Tdap/Td (3 - Td or Tdap) 10/06/2025   DEXA SCAN  Completed   Hepatitis C Screening  Completed   Zoster Vaccines- Shingrix  Completed   HPV VACCINES  Aged Out   Pneumonia Vaccine 88+ Years old  Discontinued     Names of Other Physician/Practitioners you currently use: 1. [facility] here for primary care 2. Dr. Harriette Bouillon, eye doctor, last visit 2023, s/p cataracts has scheduled next month 3. Dr. Toni Arthurs, dentist, Q6 months, last visit 2024, Q76m  Patient Care Team: Lucky Cowboy, MD as PCP - General (Internal Medicine) Marney Setting, MD as Referring Physician (Gastroenterology) Annamaria Helling, MD as Consulting Physician (Obstetrics and Gynecology) Jethro Bolus, MD (Inactive) as Consulting Physician (Urology) Rubin Payor as Physician Assistant (Orthopedic Surgery) Janalyn Harder, MD (Inactive) as Consulting Physician (Dermatology) Iva Boop, MD as Consulting Physician  (Gastroenterology) Glenford Peers, OD as Referring Physician (Optometry) Marguerita Beards, MD as Consulting Physician (Obstetrics and Gynecology)  SURGICAL HISTORY She  has a past surgical history that includes Abdominal hysterectomy; Cesarean section; Hernia repair; Appendectomy; Tonsillectomy and adenoidectomy; Spine surgery; Laparoscopic lysis intestinal adhesions; Maximum access (mas)posterior lumbar interbody fusion (plif) 1 level (N/A, 04/16/2015); Anterior lat lumbar fusion (N/A, 04/07/2017); Total knee arthroplasty (Left, 07/17/2018); Extracorporeal shock wave lithotripsy (2015); Bladder suspension (2013); Bladder suspension (N/A, 12/09/2021); Cystoscopy (N/A, 12/09/2021); and Excision of mesh (N/A, 03/14/2022). FAMILY HISTORY Her family history includes Asthma in her sister; Cancer in her maternal grandmother; Heart  disease in her mother; Hyperlipidemia in her mother; Thyroid disease in her mother. SOCIAL HISTORY She  reports that she has never smoked. She has never used smokeless tobacco. She reports that she does not drink alcohol and does not use drugs.   Review of Systems  Constitutional:  Negative for malaise/fatigue and weight loss.  HENT:  Negative for hearing loss and tinnitus.   Eyes:  Negative for blurred vision and double vision.  Respiratory:  Negative for cough, sputum production, shortness of breath and wheezing.   Cardiovascular:  Negative for chest pain, palpitations, orthopnea, claudication, leg swelling and PND.  Gastrointestinal:  Negative for abdominal pain, blood in stool, constipation, diarrhea, heartburn, melena, nausea and vomiting.  Genitourinary:  Positive for frequency. Negative for dysuria, hematuria and urgency.       Stress incontinences,improved with second bladder sling and gemtesa  Musculoskeletal:  Positive for back pain (chronic lumbar) and joint pain (L knee). Negative for falls and myalgias.  Skin:  Negative for rash.  Neurological:  Negative  for dizziness, tingling, sensory change, weakness and headaches.  Endo/Heme/Allergies:  Negative for polydipsia.  Psychiatric/Behavioral:  Negative for depression, memory loss, substance abuse and suicidal ideas. The patient has insomnia. The patient is not nervous/anxious.   All other systems reviewed and are negative.    Objective:     Today's Vitals   05/30/23 1005  BP: (!) 140/72  Pulse: (!) 58  Temp: 97.8 F (36.6 C)  SpO2: 99%  Weight: 132 lb 12.8 oz (60.2 kg)  Height: 4' 11.5" (1.511 m)    Body mass index is 26.37 kg/m.  General appearance: alert, no distress, WD/WN, female HEENT: normocephalic, sclerae anicteric, TMs pearly, nares patent, no discharge or erythema, pharynx normal Oral cavity: MMM, no lesions Neck: supple, no lymphadenopathy, no thyromegaly, no masses Heart: RRR, normal S1, S2, no murmurs Lungs: CTA bilaterally, no wheezes, rhonchi, or rales Abdomen: +bs, soft, non tender, non distended, no masses, no hepatomegaly, no splenomegaly Musculoskeletal: nontender, no swelling, no obvious deformity Extremities: no edema, no cyanosis, no clubbing Pulses: 2+ symmetric, upper and lower extremities, normal cap refill Neurological: alert, oriented x 3, CN2-12 intact, strength normal upper extremities and lower extremities, sensation normal throughout, DTRs 2+ throughout, no cerebellar signs, gait normal Psychiatric: normal affect, behavior normal, pleasant   EKG:  Sinus Bradycardia   Adela Glimpse, NP 11:15 AM [facility]

## 2023-05-30 NOTE — Patient Instructions (Signed)

## 2023-05-30 NOTE — Addendum Note (Signed)
Addended by: Dionicio Stall on: 05/30/2023 11:45 AM   Modules accepted: Orders

## 2023-05-31 ENCOUNTER — Encounter: Payer: Medicare PPO | Admitting: Nurse Practitioner

## 2023-05-31 ENCOUNTER — Other Ambulatory Visit: Payer: Self-pay | Admitting: Nurse Practitioner

## 2023-05-31 LAB — CBC WITH DIFFERENTIAL/PLATELET
Absolute Monocytes: 354 {cells}/uL (ref 200–950)
Basophils Absolute: 51 {cells}/uL (ref 0–200)
Basophils Relative: 1.1 %
Eosinophils Absolute: 184 {cells}/uL (ref 15–500)
Eosinophils Relative: 4 %
HCT: 37.5 % (ref 35.0–45.0)
Hemoglobin: 12.9 g/dL (ref 11.7–15.5)
Lymphs Abs: 1596 {cells}/uL (ref 850–3900)
MCH: 30.9 pg (ref 27.0–33.0)
MCHC: 34.4 g/dL (ref 32.0–36.0)
MCV: 89.9 fL (ref 80.0–100.0)
MPV: 8.9 fL (ref 7.5–12.5)
Monocytes Relative: 7.7 %
Neutro Abs: 2415 {cells}/uL (ref 1500–7800)
Neutrophils Relative %: 52.5 %
Platelets: 356 10*3/uL (ref 140–400)
RBC: 4.17 10*6/uL (ref 3.80–5.10)
RDW: 13 % (ref 11.0–15.0)
Total Lymphocyte: 34.7 %
WBC: 4.6 10*3/uL (ref 3.8–10.8)

## 2023-05-31 LAB — VITAMIN D 25 HYDROXY (VIT D DEFICIENCY, FRACTURES): Vit D, 25-Hydroxy: 51 ng/mL (ref 30–100)

## 2023-05-31 LAB — URINALYSIS, ROUTINE W REFLEX MICROSCOPIC
Bilirubin Urine: NEGATIVE
Glucose, UA: NEGATIVE
Hgb urine dipstick: NEGATIVE
Hyaline Cast: NONE SEEN /LPF
Leukocytes,Ua: NEGATIVE
Nitrite: POSITIVE — AB
Protein, ur: NEGATIVE
Specific Gravity, Urine: 1.009 (ref 1.001–1.035)
pH: 7 (ref 5.0–8.0)

## 2023-05-31 LAB — COMPLETE METABOLIC PANEL WITH GFR
AG Ratio: 2 (calc) (ref 1.0–2.5)
ALT: 10 U/L (ref 6–29)
AST: 17 U/L (ref 10–35)
Albumin: 4.8 g/dL (ref 3.6–5.1)
Alkaline phosphatase (APISO): 61 U/L (ref 37–153)
BUN: 13 mg/dL (ref 7–25)
CO2: 26 mmol/L (ref 20–32)
Calcium: 9.9 mg/dL (ref 8.6–10.4)
Chloride: 107 mmol/L (ref 98–110)
Creat: 0.74 mg/dL (ref 0.60–1.00)
Globulin: 2.4 g/dL (ref 1.9–3.7)
Glucose, Bld: 91 mg/dL (ref 65–99)
Potassium: 4.2 mmol/L (ref 3.5–5.3)
Sodium: 142 mmol/L (ref 135–146)
Total Bilirubin: 0.5 mg/dL (ref 0.2–1.2)
Total Protein: 7.2 g/dL (ref 6.1–8.1)
eGFR: 86 mL/min/{1.73_m2} (ref >=60)

## 2023-05-31 LAB — TSH: TSH: 2.08 m[IU]/L (ref 0.40–4.50)

## 2023-05-31 LAB — HEMOGLOBIN A1C
Hgb A1c MFr Bld: 5.4 %{Hb} (ref <5.7)
Mean Plasma Glucose: 108 mg/dL
eAG (mmol/L): 6 mmol/L

## 2023-05-31 LAB — LIPID PANEL
Cholesterol: 196 mg/dL (ref <200)
HDL: 76 mg/dL (ref >=50)
LDL Cholesterol (Calc): 100 mg/dL — ABNORMAL HIGH
Non-HDL Cholesterol (Calc): 120 mg/dL (ref <130)
Total CHOL/HDL Ratio: 2.6 (calc) (ref <5.0)
Triglycerides: 105 mg/dL (ref <150)

## 2023-05-31 LAB — TEST AUTHORIZATION: TEST CODE:: 5616

## 2023-05-31 LAB — VITAMIN B12: Vitamin B-12: >2000 pg/mL — ABNORMAL HIGH (ref 200–1100)

## 2023-05-31 LAB — MICROALBUMIN / CREATININE URINE RATIO
Creatinine, Urine: 83 mg/dL (ref 20–275)
Microalb Creat Ratio: 5 mg/g{creat} (ref <30)
Microalb, Ur: 0.4 mg/dL

## 2023-05-31 LAB — INSULIN, RANDOM: Insulin: 6.9 u[IU]/mL

## 2023-05-31 LAB — MAGNESIUM: Magnesium: 2.1 mg/dL (ref 1.5–2.5)

## 2023-05-31 LAB — MICROSCOPIC MESSAGE

## 2023-05-31 MED ORDER — CIPROFLOXACIN HCL 250 MG PO TABS: ORAL_TABLET | ORAL | 0 refills | Status: DC

## 2023-06-08 DIAGNOSIS — Z981 Arthrodesis status: Secondary | ICD-10-CM | POA: Diagnosis not present

## 2023-06-08 DIAGNOSIS — M5416 Radiculopathy, lumbar region: Secondary | ICD-10-CM | POA: Diagnosis not present

## 2023-06-08 DIAGNOSIS — G2581 Restless legs syndrome: Secondary | ICD-10-CM | POA: Diagnosis not present

## 2023-06-13 DIAGNOSIS — Z6825 Body mass index (BMI) 25.0-25.9, adult: Secondary | ICD-10-CM | POA: Diagnosis not present

## 2023-06-13 DIAGNOSIS — M5416 Radiculopathy, lumbar region: Secondary | ICD-10-CM | POA: Diagnosis not present

## 2023-06-15 DIAGNOSIS — M5416 Radiculopathy, lumbar region: Secondary | ICD-10-CM | POA: Diagnosis not present

## 2023-06-19 ENCOUNTER — Other Ambulatory Visit: Payer: Self-pay | Admitting: Student

## 2023-06-19 DIAGNOSIS — M5416 Radiculopathy, lumbar region: Secondary | ICD-10-CM

## 2023-06-20 ENCOUNTER — Encounter: Payer: Self-pay | Admitting: Student

## 2023-07-03 ENCOUNTER — Ambulatory Visit
Admission: RE | Admit: 2023-07-03 | Discharge: 2023-07-03 | Disposition: A | Discharge status: Home / Self Care | Payer: Medicare PPO | Source: Ambulatory Visit | Attending: Student

## 2023-07-03 DIAGNOSIS — M48061 Spinal stenosis, lumbar region without neurogenic claudication: Secondary | ICD-10-CM | POA: Diagnosis not present

## 2023-07-03 DIAGNOSIS — Z981 Arthrodesis status: Secondary | ICD-10-CM | POA: Diagnosis not present

## 2023-07-03 DIAGNOSIS — M5416 Radiculopathy, lumbar region: Secondary | ICD-10-CM

## 2023-07-03 DIAGNOSIS — M4316 Spondylolisthesis, lumbar region: Secondary | ICD-10-CM | POA: Diagnosis not present

## 2023-07-03 MED ORDER — GADOPICLENOL 0.5 MMOL/ML IV SOLN
6.0000 mL | Freq: Once | INTRAVENOUS | Status: AC | PRN
  Administered 2023-07-03: 6 mL via INTRAVENOUS

## 2023-07-20 DIAGNOSIS — M4317 Spondylolisthesis, lumbosacral region: Secondary | ICD-10-CM | POA: Diagnosis not present

## 2023-07-27 ENCOUNTER — Encounter: Payer: Self-pay | Admitting: Obstetrics and Gynecology

## 2023-07-27 ENCOUNTER — Ambulatory Visit: Payer: Medicare PPO | Admitting: Obstetrics and Gynecology

## 2023-07-27 VITALS — BP 125/74 | HR 67

## 2023-07-27 DIAGNOSIS — N3281 Overactive bladder: Secondary | ICD-10-CM

## 2023-07-27 DIAGNOSIS — R350 Frequency of micturition: Secondary | ICD-10-CM | POA: Diagnosis not present

## 2023-07-27 MED ORDER — GEMTESA 75 MG PO TABS: 75.0000 mg | ORAL_TABLET | Freq: Every day | ORAL | 3 refills | Status: DC

## 2023-07-27 NOTE — Progress Notes (Signed)
[facility] Return Visit  SUBJECTIVE  History of Present Illness: Monique Thomas is a 72 y.o. female seen in follow-up for OAB.    Patient has been taking Gemtesa 75mg  daily. She reports she does still have some times of intermittent frequency but overall she is well managed.    Past Medical History: Patient  has a past medical history of Anxiety, Arthritis, Back pain, chronic, Depression, GERD (gastroesophageal reflux disease), History of kidney stones (2015), Hyperlipidemia, Hypothyroidism, Skin abnormality (12/07/2021), Skin Cancer (HCC), SUI (stress urinary incontinence, female), Thyroid disease, and TMJ (temporomandibular joint disorder).   Past Surgical History: She  has a past surgical history that includes Abdominal hysterectomy; Cesarean section; Hernia repair; Appendectomy; Tonsillectomy and adenoidectomy; Spine surgery; Laparoscopic lysis intestinal adhesions; Maximum access (mas)posterior lumbar interbody fusion (plif) 1 level (N/A, 04/16/2015); Anterior lat lumbar fusion (N/A, 04/07/2017); Total knee arthroplasty (Left, 07/17/2018); Extracorporeal shock wave lithotripsy (2015); Bladder suspension (2013); Bladder suspension (N/A, 12/09/2021); Cystoscopy (N/A, 12/09/2021); and Excision of mesh (N/A, 03/14/2022).   Medications: She has a current medication list which includes the following prescription(s): alprazolam, vitamin d, estradiol, gemtesa, levothyroxine, lidocaine, naproxen sodium, pantoprazole, and ropinirole.   Allergies: Patient is allergic to celebrex [celecoxib], other, topiramate, gabapentin, lyrica [pregabalin], zofran [ondansetron], entex lq [phenylephrine-guaifenesin], and fiorinal [butalbital-aspirin-caffeine].   Social History: Patient  reports that she has never smoked. She has never used smokeless tobacco. She reports that she does not drink alcohol and does not use drugs.      OBJECTIVE     Physical Exam: Vitals:   07/27/23 1033  BP: 125/74   Pulse: 67   Gen: No apparent distress, A&O x 3.  Detailed Urogynecologic Evaluation:  Deferred.    ASSESSMENT AND PLAN    Monique Thomas is a 71 y.o. with:  1. Overactive bladder   2. Urinary frequency    Patient reports she is well managed on Gemtesa but the cost is quite high for her. She is not eligible for anticholinergics due to her age and the risk for brain fog and memory impairment and anticholinergics are listed on the BEERS list. She has also tried and failed Myrbetriq previously.  2. Will request a Tier exception for Gemtesa due to financial burden.   Patient to return in one year or sooner if needed.

## 2023-07-27 NOTE — Patient Instructions (Signed)
Try not to sip your fluids.

## 2023-08-03 DIAGNOSIS — E2839 Other primary ovarian failure: Secondary | ICD-10-CM | POA: Diagnosis not present

## 2023-08-03 DIAGNOSIS — M8588 Other specified disorders of bone density and structure, other site: Secondary | ICD-10-CM | POA: Diagnosis not present

## 2023-08-03 DIAGNOSIS — Z780 Asymptomatic menopausal state: Secondary | ICD-10-CM | POA: Diagnosis not present

## 2023-08-03 LAB — HM DEXA SCAN

## 2023-08-04 ENCOUNTER — Encounter: Payer: Self-pay | Admitting: Internal Medicine

## 2023-08-29 ENCOUNTER — Telehealth: Payer: Self-pay | Admitting: Nurse Practitioner

## 2023-08-29 NOTE — Telephone Encounter (Addendum)
Patient called [facility] to ask why our office had not received the results from bone density test. They said they has already faxed over the results... therefore we need to fax over a release request to [facility]. They put her concern in a queue and told her someone will call her later.

## 2023-08-29 NOTE — Telephone Encounter (Signed)
Patient aware of results. Going to come by and pick up a copy.

## 2023-08-29 NOTE — Telephone Encounter (Signed)
Please advise osteopenia, continue Vit D supplementation and weight bearing exercise

## 2023-08-29 NOTE — Telephone Encounter (Signed)
Called for bone density results, but results have not come back.

## 2023-09-05 DIAGNOSIS — M5417 Radiculopathy, lumbosacral region: Secondary | ICD-10-CM | POA: Diagnosis not present

## 2023-09-07 DIAGNOSIS — M4317 Spondylolisthesis, lumbosacral region: Secondary | ICD-10-CM | POA: Diagnosis not present

## 2023-09-07 DIAGNOSIS — M5416 Radiculopathy, lumbar region: Secondary | ICD-10-CM | POA: Diagnosis not present

## 2023-09-18 DIAGNOSIS — Z1231 Encounter for screening mammogram for malignant neoplasm of breast: Secondary | ICD-10-CM | POA: Diagnosis not present

## 2023-09-18 LAB — HM MAMMOGRAPHY

## 2023-09-22 ENCOUNTER — Encounter: Payer: Self-pay | Admitting: Internal Medicine

## 2023-09-25 ENCOUNTER — Ambulatory Visit: Payer: Medicare PPO | Admitting: Nurse Practitioner

## 2023-09-27 ENCOUNTER — Other Ambulatory Visit: Payer: Self-pay | Admitting: Neurological Surgery

## 2023-10-11 ENCOUNTER — Other Ambulatory Visit (HOSPITAL_COMMUNITY): Payer: Medicare PPO

## 2023-10-12 NOTE — Progress Notes (Signed)
Surgical Instructions   Your procedure is scheduled on Wednesday, October 18, 2023. Report to [facility] Main Entrance "A" at 5:30 A.M., then check in with the Admitting office. Any questions or running late day of surgery: call [phone]  Questions prior to your surgery date: call [phone], Monday-Friday, 8am-4pm. If you experience any cold or flu symptoms such as cough, fever, chills, shortness of breath, etc. between now and your scheduled surgery, please notify us at the above number.     Remember:  Do not eat or drink after midnight the night before your surgery  Take these medicines the morning of surgery with A SIP OF WATER  estradiol (ESTRACE)  GEMTESA  levothyroxine (SYNTHROID)  TOPAMAX    May take these medicines IF NEEDED: ALPRAZolam (XANAX)  pantoprazole (PROTONIX)    One week prior to surgery, STOP taking any Aspirin (unless otherwise instructed by your surgeon) Aleve, Naproxen, Ibuprofen, Motrin, Advil, Goody's, BC's, all herbal medications, fish oil, and non-prescription vitamins.                     Do NOT Smoke (Tobacco/Vaping) for 24 hours prior to your procedure.  If you use a CPAP at night, you may bring your mask/headgear for your overnight stay.   You will be asked to remove any contacts, glasses, piercing's, hearing aid's, dentures/partials prior to surgery. Please bring cases for these items if needed.    Patients discharged the day of surgery will not be allowed to drive home, and someone needs to stay with them for 24 hours.  SURGICAL WAITING ROOM VISITATION Patients may have no more than 2 support people in the waiting area - these visitors may rotate.   Pre-op nurse will coordinate an appropriate time for 1 ADULT support person, who may not rotate, to accompany patient in pre-op.  Children under the age of 66 must have an adult with them who is not the patient and must remain in the main waiting area with an adult.  If the patient needs to  stay at the hospital during part of their recovery, the visitor guidelines for inpatient rooms apply.  Please refer to the [facility] website for the visitor guidelines for any additional information.   If you received a COVID test during your pre-op visit  it is requested that you wear a mask when out in public, stay away from anyone that may not be feeling well and notify your surgeon if you develop symptoms. If you have been in contact with anyone that has tested positive in the last 10 days please notify you surgeon.      Pre-operative 5 CHG Bathing Instructions   You can play a key role in reducing the risk of infection after surgery. Your skin needs to be as free of germs as possible. You can reduce the number of germs on your skin by washing with CHG (chlorhexidine gluconate) soap before surgery. CHG is an antiseptic soap that kills germs and continues to kill germs even after washing.   DO NOT use if you have an allergy to chlorhexidine/CHG or antibacterial soaps. If your skin becomes reddened or irritated, stop using the CHG and notify one of our RNs at [phone].   Please shower with the CHG soap starting 4 days before surgery using the following schedule:     Please keep in mind the following:  DO NOT shave, including legs and underarms, starting the day of your first shower.   You may shave  your face at any point before/day of surgery.  Place clean sheets on your bed the day you start using CHG soap. Use a clean washcloth (not used since being washed) for each shower. DO NOT sleep with pets once you start using the CHG.   CHG Shower Instructions:  Wash your face and private area with normal soap. If you choose to wash your hair, wash first with your normal shampoo.  After you use shampoo/soap, rinse your hair and body thoroughly to remove shampoo/soap residue.  Turn the water OFF and apply about 3 tablespoons (45 ml) of CHG soap to a CLEAN washcloth.  Apply CHG soap  ONLY FROM YOUR NECK DOWN TO YOUR TOES (washing for 3-5 minutes)  DO NOT use CHG soap on face, private areas, open wounds, or sores.  Pay special attention to the area where your surgery is being performed.  If you are having back surgery, having someone wash your back for you may be helpful. Wait 2 minutes after CHG soap is applied, then you may rinse off the CHG soap.  Pat dry with a clean towel  Put on clean clothes/pajamas   If you choose to wear lotion, please use ONLY the CHG-compatible lotions that are listed below.  Additional instructions for the day of surgery: DO NOT APPLY any lotions, deodorants, cologne, or perfumes.   Do not bring valuables to the hospital. [facility] is not responsible for any belongings/valuables. Do not wear nail polish, gel polish, artificial nails, or any other type of covering on natural nails (fingers and toes) Do not wear jewelry or makeup Put on clean/comfortable clothes.  Please brush your teeth.  Ask your nurse before applying any prescription medications to the skin.     CHG Compatible Lotions   Aveeno Moisturizing lotion  Cetaphil Moisturizing Cream  Cetaphil Moisturizing Lotion  Clairol Herbal Essence Moisturizing Lotion, Dry Skin  Clairol Herbal Essence Moisturizing Lotion, Extra Dry Skin  Clairol Herbal Essence Moisturizing Lotion, Normal Skin  Curel Age Defying Therapeutic Moisturizing Lotion with Alpha Hydroxy  Curel Extreme Care Body Lotion  Curel Soothing Hands Moisturizing Hand Lotion  Curel Therapeutic Moisturizing Cream, Fragrance-Free  Curel Therapeutic Moisturizing Lotion, Fragrance-Free  Curel Therapeutic Moisturizing Lotion, Original Formula  Eucerin Daily Replenishing Lotion  Eucerin Dry Skin Therapy Plus Alpha Hydroxy Crme  Eucerin Dry Skin Therapy Plus Alpha Hydroxy Lotion  Eucerin Original Crme  Eucerin Original Lotion  Eucerin Plus Crme Eucerin Plus Lotion  Eucerin TriLipid Replenishing Lotion  Keri  Anti-Bacterial Hand Lotion  Keri Deep Conditioning Original Lotion Dry Skin Formula Softly Scented  Keri Deep Conditioning Original Lotion, Fragrance Free Sensitive Skin Formula  Keri Lotion Fast Absorbing Fragrance Free Sensitive Skin Formula  Keri Lotion Fast Absorbing Softly Scented Dry Skin Formula  Keri Original Lotion  Keri Skin Renewal Lotion Keri Silky Smooth Lotion  Keri Silky Smooth Sensitive Skin Lotion  Nivea Body Creamy Conditioning Oil  Nivea Body Extra Enriched Lotion  Nivea Body Original Lotion  Nivea Body Sheer Moisturizing Lotion Nivea Crme  Nivea Skin Firming Lotion  NutraDerm 30 Skin Lotion  NutraDerm Skin Lotion  NutraDerm Therapeutic Skin Cream  NutraDerm Therapeutic Skin Lotion  ProShield Protective Hand Cream  Provon moisturizing lotion  Please read over the following fact sheets that you were given.

## 2023-10-13 ENCOUNTER — Other Ambulatory Visit: Payer: Self-pay

## 2023-10-13 ENCOUNTER — Encounter (HOSPITAL_COMMUNITY)
Admission: RE | Admit: 2023-10-13 | Discharge: 2023-10-13 | Disposition: A | Discharge status: Home / Self Care | Payer: Medicare PPO | Source: Ambulatory Visit | Attending: Neurological Surgery

## 2023-10-13 ENCOUNTER — Encounter (HOSPITAL_COMMUNITY): Payer: Self-pay

## 2023-10-13 VITALS — BP 139/54 | HR 84 | Temp 97.7°F | Resp 17 | Ht 60.0 in | Wt 135.8 lb

## 2023-10-13 DIAGNOSIS — Z01812 Encounter for preprocedural laboratory examination: Secondary | ICD-10-CM | POA: Diagnosis not present

## 2023-10-13 DIAGNOSIS — Z01818 Encounter for other preprocedural examination: Secondary | ICD-10-CM

## 2023-10-13 LAB — BASIC METABOLIC PANEL
Anion gap: 11 (ref 5–15)
BUN: 11 mg/dL (ref 8–23)
CO2: 21 mmol/L — ABNORMAL LOW (ref 22–32)
Calcium: 9 mg/dL (ref 8.9–10.3)
Chloride: 108 mmol/L (ref 98–111)
Creatinine, Ser: 0.76 mg/dL (ref 0.44–1.00)
GFR, Estimated: >60 mL/min (ref >60)
Glucose, Bld: 109 mg/dL — ABNORMAL HIGH (ref 70–99)
Potassium: 3.8 mmol/L (ref 3.5–5.1)
Sodium: 140 mmol/L (ref 135–145)

## 2023-10-13 LAB — SURGICAL PCR SCREEN
MRSA, PCR: NEGATIVE
Staphylococcus aureus: NEGATIVE

## 2023-10-13 LAB — TYPE AND SCREEN
ABO/RH(D): B POS
Antibody Screen: NEGATIVE

## 2023-10-13 LAB — CBC
HCT: 37 % (ref 36.0–46.0)
Hemoglobin: 12.5 g/dL (ref 12.0–15.0)
MCH: 30.9 pg (ref 26.0–34.0)
MCHC: 33.8 g/dL (ref 30.0–36.0)
MCV: 91.6 fL (ref 80.0–100.0)
Platelets: 349 10*3/uL (ref 150–400)
RBC: 4.04 MIL/uL (ref 3.87–5.11)
RDW: 12.9 % (ref 11.5–15.5)
WBC: 4.2 10*3/uL (ref 4.0–10.5)
nRBC: 0 % (ref 0.0–0.2)

## 2023-10-13 LAB — PROTIME-INR
INR: 1 (ref 0.8–1.2)
Prothrombin Time: 13.5 s (ref 11.4–15.2)

## 2023-10-13 NOTE — Progress Notes (Addendum)
PCP - Dr. Lucky Cowboy Cardiologist - Denies  PPM/ICD - Denies Device Orders - n/a Rep Notified -  n/a  Chest x-ray - n/a EKG - 05-30-23 Stress Test - Denies ECHO - Denies Cardiac Cath - Denies  Sleep Study - Denies CPAP - na  NON-diabetic  Last dose of GLP1 agonist-  Denies GLP1 instructions: n/a  Blood Thinner Instructions:Denies Aspirin Instructions:Denies  ERAS Protcol - NPO PRE-SURGERY Ensure or G2- none  COVID TEST- N   Anesthesia review: Yes, patient has history of TMJ and states it does act up if at dental appt so not sure if it will with surgery.  Patient denies shortness of breath, fever, cough and chest pain at PAT appointment. Patient states no respiratory issues at this time.    All instructions explained to the patient, with a verbal understanding of the material. Patient agrees to go over the instructions while at home for a better understanding. Patient also instructed to self quarantine after being tested for COVID-19. The opportunity to ask questions was provided.

## 2023-10-18 ENCOUNTER — Ambulatory Visit (HOSPITAL_COMMUNITY): Payer: Medicare PPO

## 2023-10-18 ENCOUNTER — Encounter (HOSPITAL_COMMUNITY)
Admission: RE | Disposition: A | Discharge status: Home / Self Care | Payer: Self-pay | Source: Home / Self Care | Attending: Neurological Surgery

## 2023-10-18 ENCOUNTER — Ambulatory Visit (HOSPITAL_BASED_OUTPATIENT_CLINIC_OR_DEPARTMENT_OTHER): Payer: Medicare PPO | Admitting: Anesthesiology

## 2023-10-18 ENCOUNTER — Observation Stay (HOSPITAL_COMMUNITY)
Admission: RE | Admit: 2023-10-18 | Discharge: 2023-10-19 | Disposition: A | Discharge status: Home / Self Care | Payer: Medicare PPO | Attending: Neurological Surgery | Admitting: Neurological Surgery

## 2023-10-18 ENCOUNTER — Ambulatory Visit (HOSPITAL_COMMUNITY): Payer: Self-pay | Admitting: Physician Assistant

## 2023-10-18 ENCOUNTER — Encounter (HOSPITAL_COMMUNITY): Payer: Self-pay | Admitting: Neurological Surgery

## 2023-10-18 ENCOUNTER — Other Ambulatory Visit: Payer: Self-pay

## 2023-10-18 DIAGNOSIS — E039 Hypothyroidism, unspecified: Secondary | ICD-10-CM

## 2023-10-18 DIAGNOSIS — M4317 Spondylolisthesis, lumbosacral region: Secondary | ICD-10-CM | POA: Diagnosis not present

## 2023-10-18 DIAGNOSIS — M4316 Spondylolisthesis, lumbar region: Secondary | ICD-10-CM | POA: Diagnosis not present

## 2023-10-18 DIAGNOSIS — F418 Other specified anxiety disorders: Secondary | ICD-10-CM

## 2023-10-18 DIAGNOSIS — Z85828 Personal history of other malignant neoplasm of skin: Secondary | ICD-10-CM | POA: Diagnosis not present

## 2023-10-18 DIAGNOSIS — M4807 Spinal stenosis, lumbosacral region: Secondary | ICD-10-CM | POA: Diagnosis not present

## 2023-10-18 DIAGNOSIS — M48061 Spinal stenosis, lumbar region without neurogenic claudication: Secondary | ICD-10-CM | POA: Diagnosis not present

## 2023-10-18 DIAGNOSIS — M5117 Intervertebral disc disorders with radiculopathy, lumbosacral region: Secondary | ICD-10-CM | POA: Diagnosis not present

## 2023-10-18 DIAGNOSIS — Z981 Arthrodesis status: Secondary | ICD-10-CM | POA: Diagnosis not present

## 2023-10-18 DIAGNOSIS — Z79899 Other long term (current) drug therapy: Secondary | ICD-10-CM | POA: Diagnosis not present

## 2023-10-18 DIAGNOSIS — M5116 Intervertebral disc disorders with radiculopathy, lumbar region: Secondary | ICD-10-CM | POA: Diagnosis not present

## 2023-10-18 DIAGNOSIS — Z96652 Presence of left artificial knee joint: Secondary | ICD-10-CM | POA: Insufficient documentation

## 2023-10-18 SURGERY — POSTERIOR LUMBAR FUSION 2 LEVEL
Anesthesia: General | Site: Spine Lumbar

## 2023-10-18 MED ORDER — HYDROMORPHONE HCL 1 MG/ML IJ SOLN
INTRAMUSCULAR | Status: DC | PRN
  Administered 2023-10-18: .5 mg via INTRAVENOUS

## 2023-10-18 MED ORDER — MIDAZOLAM HCL 2 MG/2ML IJ SOLN
INTRAMUSCULAR | Status: DC | PRN
  Administered 2023-10-18 (×2): 1 mg via INTRAVENOUS

## 2023-10-18 MED ORDER — MIDAZOLAM HCL 2 MG/2ML IJ SOLN
INTRAMUSCULAR | Status: AC
  Filled 2023-10-18: qty 2

## 2023-10-18 MED ORDER — TOPIRAMATE 25 MG PO TABS: 25.0000 mg | ORAL_TABLET | Freq: Every day | ORAL | Status: DC

## 2023-10-18 MED ORDER — PROPOFOL 10 MG/ML IV BOLUS
INTRAVENOUS | Status: AC
  Filled 2023-10-18: qty 20

## 2023-10-18 MED ORDER — THROMBIN 20000 UNITS EX SOLR: CUTANEOUS | Status: DC | PRN

## 2023-10-18 MED ORDER — SODIUM CHLORIDE 0.9% FLUSH
3.0000 mL | INTRAVENOUS | Status: DC | PRN
Start: 2023-10-18 — End: 2023-10-19

## 2023-10-18 MED ORDER — CEFAZOLIN SODIUM-DEXTROSE 2-4 GM/100ML-% IV SOLN
2.0000 g | INTRAVENOUS | Status: AC
  Administered 2023-10-18: 2 g via INTRAVENOUS
  Filled 2023-10-18: qty 100

## 2023-10-18 MED ORDER — MORPHINE SULFATE (PF) 2 MG/ML IV SOLN
2.0000 mg | INTRAVENOUS | Status: DC | PRN
  Filled 2023-10-18: qty 1

## 2023-10-18 MED ORDER — ACETAMINOPHEN 500 MG PO TABS
1000.0000 mg | ORAL_TABLET | Freq: Four times a day (QID) | ORAL | Status: DC
  Administered 2023-10-18 – 2023-10-19 (×3): 1000 mg via ORAL
  Filled 2023-10-18 (×3): qty 2

## 2023-10-18 MED ORDER — LEVOTHYROXINE SODIUM 88 MCG PO TABS: 88.0000 ug | ORAL_TABLET | Freq: Every day | ORAL | Status: DC

## 2023-10-18 MED ORDER — CYCLOBENZAPRINE HCL 5 MG PO TABS
5.0000 mg | ORAL_TABLET | Freq: Three times a day (TID) | ORAL | Status: DC | PRN
  Administered 2023-10-18 (×3): 5 mg via ORAL
  Filled 2023-10-18 (×2): qty 1

## 2023-10-18 MED ORDER — CHLORHEXIDINE GLUCONATE 0.12 % MT SOLN
15.0000 mL | Freq: Once | OROMUCOSAL | Status: AC
Start: 2023-10-18 — End: 2023-10-18
  Administered 2023-10-18: 15 mL via OROMUCOSAL
  Filled 2023-10-18: qty 15

## 2023-10-18 MED ORDER — BUPIVACAINE HCL (PF) 0.25 % IJ SOLN
INTRAMUSCULAR | Status: DC | PRN
  Administered 2023-10-18: 6 mL

## 2023-10-18 MED ORDER — OXYCODONE HCL 5 MG/5ML PO SOLN: 5.0000 mg | Freq: Once | ORAL | Status: DC | PRN

## 2023-10-18 MED ORDER — DEXAMETHASONE SODIUM PHOSPHATE 4 MG/ML IJ SOLN
4.0000 mg | Freq: Four times a day (QID) | INTRAMUSCULAR | Status: DC
  Administered 2023-10-18: 4 mg via INTRAVENOUS
  Filled 2023-10-18: qty 1

## 2023-10-18 MED ORDER — SUGAMMADEX SODIUM 200 MG/2ML IV SOLN
INTRAVENOUS | Status: DC | PRN
  Administered 2023-10-18: 122.4 mg via INTRAVENOUS

## 2023-10-18 MED ORDER — PHENOL 1.4 % MT LIQD: 1.0000 | OROMUCOSAL | Status: DC | PRN

## 2023-10-18 MED ORDER — MIRABEGRON ER 25 MG PO TB24
25.0000 mg | ORAL_TABLET | Freq: Every day | ORAL | Status: DC
  Filled 2023-10-18: qty 1

## 2023-10-18 MED ORDER — HYDROMORPHONE HCL 1 MG/ML IJ SOLN
0.5000 mg | INTRAMUSCULAR | Status: DC | PRN
  Administered 2023-10-18: 0.5 mg via INTRAVENOUS

## 2023-10-18 MED ORDER — MENTHOL 3 MG MT LOZG: 1.0000 | LOZENGE | OROMUCOSAL | Status: DC | PRN

## 2023-10-18 MED ORDER — LIDOCAINE 2% (20 MG/ML) 5 ML SYRINGE
INTRAMUSCULAR | Status: DC | PRN
  Administered 2023-10-18: 60 mg via INTRAVENOUS

## 2023-10-18 MED ORDER — FENTANYL CITRATE (PF) 250 MCG/5ML IJ SOLN
INTRAMUSCULAR | Status: DC | PRN
  Administered 2023-10-18 (×3): 50 ug via INTRAVENOUS
  Administered 2023-10-18: 100 ug via INTRAVENOUS

## 2023-10-18 MED ORDER — FENTANYL CITRATE (PF) 100 MCG/2ML IJ SOLN
INTRAMUSCULAR | Status: AC
  Filled 2023-10-18: qty 2

## 2023-10-18 MED ORDER — CEFAZOLIN SODIUM-DEXTROSE 2-4 GM/100ML-% IV SOLN
2.0000 g | Freq: Three times a day (TID) | INTRAVENOUS | Status: AC
  Administered 2023-10-18 (×2): 2 g via INTRAVENOUS
  Filled 2023-10-18 (×2): qty 100

## 2023-10-18 MED ORDER — DEXAMETHASONE 4 MG PO TABS
4.0000 mg | ORAL_TABLET | Freq: Four times a day (QID) | ORAL | Status: DC
  Administered 2023-10-18 – 2023-10-19 (×3): 4 mg via ORAL
  Filled 2023-10-18 (×3): qty 1

## 2023-10-18 MED ORDER — HYDROXYZINE HCL 50 MG/ML IM SOLN: 50.0000 mg | Freq: Four times a day (QID) | INTRAMUSCULAR | Status: DC | PRN

## 2023-10-18 MED ORDER — ACETAMINOPHEN 10 MG/ML IV SOLN
INTRAVENOUS | Status: AC
  Filled 2023-10-18: qty 100

## 2023-10-18 MED ORDER — OXYCODONE HCL 5 MG PO TABS
5.0000 mg | ORAL_TABLET | ORAL | Status: DC | PRN
  Administered 2023-10-18 – 2023-10-19 (×4): 5 mg via ORAL
  Filled 2023-10-18 (×4): qty 1

## 2023-10-18 MED ORDER — SODIUM CHLORIDE 0.9% FLUSH: 3.0000 mL | INTRAVENOUS | Status: DC | PRN

## 2023-10-18 MED ORDER — SODIUM CHLORIDE 0.9% FLUSH
3.0000 mL | Freq: Two times a day (BID) | INTRAVENOUS | Status: DC
  Administered 2023-10-18: 3 mL via INTRAVENOUS

## 2023-10-18 MED ORDER — MAGNESIUM GLUCONATE 500 MG PO TABS
500.0000 mg | ORAL_TABLET | Freq: Every day | ORAL | Status: DC
  Filled 2023-10-18: qty 1

## 2023-10-18 MED ORDER — PHENYLEPHRINE 80 MCG/ML (10ML) SYRINGE FOR IV PUSH (FOR BLOOD PRESSURE SUPPORT)
PREFILLED_SYRINGE | INTRAVENOUS | Status: DC | PRN
  Administered 2023-10-18 (×2): 80 ug via INTRAVENOUS

## 2023-10-18 MED ORDER — CHLORHEXIDINE GLUCONATE CLOTH 2 % EX PADS
6.0000 | MEDICATED_PAD | Freq: Once | CUTANEOUS | Status: DC
Start: 2023-10-18 — End: 2023-10-18

## 2023-10-18 MED ORDER — BUPIVACAINE HCL (PF) 0.25 % IJ SOLN
INTRAMUSCULAR | Status: AC
  Filled 2023-10-18: qty 30

## 2023-10-18 MED ORDER — SENNA 8.6 MG PO TABS
1.0000 | ORAL_TABLET | Freq: Two times a day (BID) | ORAL | Status: DC
  Administered 2023-10-18: 8.6 mg via ORAL
  Filled 2023-10-18: qty 1

## 2023-10-18 MED ORDER — ADHERUS DURAL SEALANT
PACK | TOPICAL | Status: DC | PRN
Start: 2023-10-18 — End: 2023-10-18
  Administered 2023-10-18: 1 via TOPICAL

## 2023-10-18 MED ORDER — ROCURONIUM BROMIDE 10 MG/ML (PF) SYRINGE
PREFILLED_SYRINGE | INTRAVENOUS | Status: DC | PRN
  Administered 2023-10-18: 30 mg via INTRAVENOUS
  Administered 2023-10-18: 20 mg via INTRAVENOUS
  Administered 2023-10-18: 50 mg via INTRAVENOUS
  Administered 2023-10-18: 30 mg via INTRAVENOUS

## 2023-10-18 MED ORDER — FENTANYL CITRATE (PF) 250 MCG/5ML IJ SOLN
INTRAMUSCULAR | Status: AC
  Filled 2023-10-18: qty 5

## 2023-10-18 MED ORDER — PANTOPRAZOLE SODIUM 40 MG PO TBEC
40.0000 mg | DELAYED_RELEASE_TABLET | Freq: Every day | ORAL | Status: DC | PRN
  Administered 2023-10-18: 40 mg via ORAL
  Filled 2023-10-18: qty 1

## 2023-10-18 MED ORDER — LACTATED RINGERS IV SOLN: INTRAVENOUS | Status: DC

## 2023-10-18 MED ORDER — EPHEDRINE SULFATE-NACL 50-0.9 MG/10ML-% IV SOSY
PREFILLED_SYRINGE | INTRAVENOUS | Status: DC | PRN
  Administered 2023-10-18: 5 mg via INTRAVENOUS

## 2023-10-18 MED ORDER — CHLORHEXIDINE GLUCONATE CLOTH 2 % EX PADS: 6.0000 | MEDICATED_PAD | Freq: Once | CUTANEOUS | Status: DC

## 2023-10-18 MED ORDER — THROMBIN 20000 UNITS EX SOLR
CUTANEOUS | Status: AC
  Filled 2023-10-18: qty 20000

## 2023-10-18 MED ORDER — SODIUM CHLORIDE 0.9% FLUSH: 3.0000 mL | Freq: Two times a day (BID) | INTRAVENOUS | Status: DC

## 2023-10-18 MED ORDER — SODIUM CHLORIDE 0.9 % IV SOLN
250.0000 mL | INTRAVENOUS | Status: DC
  Administered 2023-10-18: 250 mL via INTRAVENOUS

## 2023-10-18 MED ORDER — ONDANSETRON HCL 4 MG/2ML IJ SOLN
4.0000 mg | Freq: Once | INTRAMUSCULAR | Status: DC | PRN
Start: 2023-10-18 — End: 2023-10-18

## 2023-10-18 MED ORDER — PROPOFOL 10 MG/ML IV BOLUS
INTRAVENOUS | Status: DC | PRN
  Administered 2023-10-18: 130 mg via INTRAVENOUS

## 2023-10-18 MED ORDER — THROMBIN 5000 UNITS EX SOLR: OROMUCOSAL | Status: DC | PRN

## 2023-10-18 MED ORDER — OXYCODONE HCL 5 MG PO TABS: 5.0000 mg | ORAL_TABLET | Freq: Once | ORAL | Status: DC | PRN

## 2023-10-18 MED ORDER — LEVOTHYROXINE SODIUM 88 MCG PO TABS
44.0000 ug | ORAL_TABLET | ORAL | Status: DC
Start: 2023-10-19 — End: 2023-10-19
  Administered 2023-10-19: 44 ug via ORAL
  Filled 2023-10-18: qty 1

## 2023-10-18 MED ORDER — HYDROMORPHONE HCL 1 MG/ML IJ SOLN
INTRAMUSCULAR | Status: AC
  Filled 2023-10-18: qty 0.5

## 2023-10-18 MED ORDER — THROMBIN 5000 UNITS EX SOLR
CUTANEOUS | Status: AC
  Filled 2023-10-18: qty 5000

## 2023-10-18 MED ORDER — ONDANSETRON HCL 4 MG/2ML IJ SOLN
INTRAMUSCULAR | Status: DC | PRN
  Administered 2023-10-18: 4 mg via INTRAVENOUS

## 2023-10-18 MED ORDER — LEVOTHYROXINE SODIUM 88 MCG PO TABS: 88.0000 ug | ORAL_TABLET | ORAL | Status: DC

## 2023-10-18 MED ORDER — FENTANYL CITRATE (PF) 100 MCG/2ML IJ SOLN
25.0000 ug | INTRAMUSCULAR | Status: DC | PRN
  Administered 2023-10-18 (×3): 50 ug via INTRAVENOUS

## 2023-10-18 MED ORDER — ORAL CARE MOUTH RINSE: 15.0000 mL | Freq: Once | OROMUCOSAL | Status: AC

## 2023-10-18 MED ORDER — ESTRADIOL 0.5 MG PO TABS: 1.0000 mg | ORAL_TABLET | Freq: Every day | ORAL | Status: DC

## 2023-10-18 MED ORDER — DEXAMETHASONE SODIUM PHOSPHATE 10 MG/ML IJ SOLN
INTRAMUSCULAR | Status: DC | PRN
  Administered 2023-10-18: 10 mg via INTRAVENOUS

## 2023-10-18 MED ORDER — HYDROMORPHONE HCL 1 MG/ML IJ SOLN
INTRAMUSCULAR | Status: AC
  Filled 2023-10-18: qty 1

## 2023-10-18 MED ORDER — 0.9 % SODIUM CHLORIDE (POUR BTL) OPTIME
TOPICAL | Status: DC | PRN
  Administered 2023-10-18: 1000 mL

## 2023-10-18 MED ORDER — ACETAMINOPHEN 10 MG/ML IV SOLN
1000.0000 mg | Freq: Once | INTRAVENOUS | Status: DC | PRN
  Administered 2023-10-18: 1000 mg via INTRAVENOUS

## 2023-10-18 MED ORDER — PHENYLEPHRINE HCL-NACL 20-0.9 MG/250ML-% IV SOLN
INTRAVENOUS | Status: AC
  Filled 2023-10-18: qty 500

## 2023-10-18 SURGICAL SUPPLY — 60 items
BAG COUNTER SPONGE SURGICOUNT (BAG) ×1 IMPLANT
BASKET BONE COLLECTION (BASKET) ×1 IMPLANT
BENZOIN TINCTURE PRP APPL 2/3 (GAUZE/BANDAGES/DRESSINGS) ×1 IMPLANT
BIT DRILL PLIF MAS DISP 5.5MM (DRILL) IMPLANT
BLADE BONE MILL MEDIUM (MISCELLANEOUS) ×1 IMPLANT
BLADE CLIPPER SURG (BLADE) IMPLANT
BONE MATRIX OSTEOCEL PRO MED (Bone Implant) IMPLANT
BUR CARBIDE MATCH 3.0 (BURR) ×1 IMPLANT
CAGE COROENT MP 8X23 (Cage) IMPLANT
CAGE COROENT PLIF 10X28-8 LUMB (Cage) IMPLANT
CANISTER SUCT 3000ML PPV (MISCELLANEOUS) ×1 IMPLANT
CLEANSER WND VASHE 34 (WOUND CARE) IMPLANT
CNTNR URN SCR LID CUP LEK RST (MISCELLANEOUS) ×1 IMPLANT
COVER BACK TABLE 60X90IN (DRAPES) ×1 IMPLANT
DERMABOND ADVANCED .7 DNX12 (GAUZE/BANDAGES/DRESSINGS) ×1 IMPLANT
DRAPE C-ARM 42X72 X-RAY (DRAPES) ×2 IMPLANT
DRAPE C-ARMOR (DRAPES) ×2 IMPLANT
DRAPE LAPAROTOMY 100X72X124 (DRAPES) ×1 IMPLANT
DRAPE SURG 17X23 STRL (DRAPES) ×1 IMPLANT
DRILL PLIF MAS DISP 5.5MM (DRILL) ×1
DRSG OPSITE POSTOP 4X6 (GAUZE/BANDAGES/DRESSINGS) IMPLANT
DURAPREP 26ML APPLICATOR (WOUND CARE) ×1 IMPLANT
ELECT REM PT RETURN 9FT ADLT (ELECTROSURGICAL) ×1
ELECTRODE REM PT RTRN 9FT ADLT (ELECTROSURGICAL) ×1 IMPLANT
EVACUATOR 1/8 PVC DRAIN (DRAIN) ×1 IMPLANT
GAUZE 4X4 16PLY ~~LOC~~+RFID DBL (SPONGE) IMPLANT
GLOVE BIO SURGEON STRL SZ7 (GLOVE) IMPLANT
GLOVE BIO SURGEON STRL SZ8 (GLOVE) ×2 IMPLANT
GLOVE BIOGEL PI IND STRL 7.0 (GLOVE) IMPLANT
GOWN STRL REUS W/ TWL LRG LVL3 (GOWN DISPOSABLE) IMPLANT
GOWN STRL REUS W/ TWL XL LVL3 (GOWN DISPOSABLE) ×2 IMPLANT
GOWN STRL REUS W/TWL 2XL LVL3 (GOWN DISPOSABLE) IMPLANT
HEMOSTAT POWDER KIT SURGIFOAM (HEMOSTASIS) ×1 IMPLANT
KIT BASIN OR (CUSTOM PROCEDURE TRAY) ×1 IMPLANT
KIT INFUSE XX SMALL 0.7CC (Orthopedic Implant) IMPLANT
KIT TURNOVER KIT B (KITS) ×1 IMPLANT
MILL BONE PREP (MISCELLANEOUS) ×1 IMPLANT
NDL HYPO 25X1 1.5 SAFETY (NEEDLE) ×1 IMPLANT
NEEDLE HYPO 25X1 1.5 SAFETY (NEEDLE) ×1 IMPLANT
NS IRRIG 1000ML POUR BTL (IV SOLUTION) ×1 IMPLANT
PACK LAMINECTOMY NEURO (CUSTOM PROCEDURE TRAY) ×1 IMPLANT
PAD ARMBOARD 7.5X6 YLW CONV (MISCELLANEOUS) ×3 IMPLANT
ROD 50MM LUMBAR (Rod) IMPLANT
SCREW LOCK FXNS SPNE MAS PL (Screw) IMPLANT
SCREW SHANK 6.5X65 (Screw) IMPLANT
SCREW SHANKS 5.5X35 (Screw) IMPLANT
SCREW TULIP 5.5 (Screw) IMPLANT
SEALANT ADHERUS EXTEND TIP (MISCELLANEOUS) IMPLANT
SPONGE SURGIFOAM ABS GEL 100 (HEMOSTASIS) ×1 IMPLANT
SPONGE T-LAP 4X18 ~~LOC~~+RFID (SPONGE) IMPLANT
STRIP CLOSURE SKIN 1/2X4 (GAUZE/BANDAGES/DRESSINGS) ×2 IMPLANT
SUT PROLENE 6 0 BV (SUTURE) IMPLANT
SUT VIC AB 0 CT1 18XCR BRD8 (SUTURE) ×1 IMPLANT
SUT VIC AB 2-0 CP2 18 (SUTURE) ×1 IMPLANT
SUT VIC AB 3-0 SH 8-18 (SUTURE) ×2 IMPLANT
SYR CONTROL 10ML LL (SYRINGE) ×1 IMPLANT
TOWEL GREEN STERILE (TOWEL DISPOSABLE) ×1 IMPLANT
TOWEL GREEN STERILE FF (TOWEL DISPOSABLE) ×1 IMPLANT
TRAY FOLEY MTR SLVR 16FR STAT (SET/KITS/TRAYS/PACK) ×1 IMPLANT
WATER STERILE IRR 1000ML POUR (IV SOLUTION) ×1 IMPLANT

## 2023-10-18 NOTE — Anesthesia Preprocedure Evaluation (Signed)
Anesthesia Evaluation  Patient identified by MRN, date of birth, ID band Patient awake    Reviewed: Allergy & Precautions, NPO status , Patient's Chart, lab work & pertinent test results, reviewed documented beta blocker date and time   History of Anesthesia Complications Negative for: history of anesthetic complications  Airway Mallampati: III  TM Distance: >3 FB Neck ROM: Full    Dental no notable dental hx.    Pulmonary neg COPD   breath sounds clear to auscultation       Cardiovascular (-) angina (-) CAD, (-) Past MI, (-) Cardiac Stents and (-) CABG  Rhythm:Regular Rate:Normal     Neuro/Psych neg Seizures PSYCHIATRIC DISORDERS Anxiety Depression     Neuromuscular disease    GI/Hepatic ,GERD  Medicated and Poorly Controlled,,(+) neg Cirrhosis        Endo/Other  Hypothyroidism    Renal/GU Renal disease     Musculoskeletal  (+) Arthritis ,    Abdominal   Peds  Hematology   Anesthesia Other Findings   Reproductive/Obstetrics                             Anesthesia Physical Anesthesia Plan  ASA: 2  Anesthesia Plan: General   Post-op Pain Management:    Induction: Intravenous  PONV Risk Score and Plan: 2 and Ondansetron and Dexamethasone  Airway Management Planned: Oral ETT and Video Laryngoscope Planned  Additional Equipment:   Intra-op Plan:   Post-operative Plan: Extubation in OR  Informed Consent: I have reviewed the patients History and Physical, chart, labs and discussed the procedure including the risks, benefits and alternatives for the proposed anesthesia with the patient or authorized representative who has indicated his/her understanding and acceptance.     Dental advisory given  Plan Discussed with: CRNA  Anesthesia Plan Comments:        Anesthesia Quick Evaluation

## 2023-10-18 NOTE — Op Note (Addendum)
10/18/2023  11:51 AM  PATIENT:  Monique Thomas  73 y.o. female  PRE-OPERATIVE DIAGNOSIS: Lytic spondylolisthesis L5-S1, severe degenerative disc disease L4-5 with lumbar disc herniation, stenosis with foraminal stenosis L4-5 L5-S1, back pain with radiculopathy  POST-OPERATIVE DIAGNOSIS:  same  PROCEDURE:   1. Decompressive lumbar laminectomy, hemi facetectomy and foraminotomies L4-5 and L5-S1 (Gill type decompression at L5-S1) requiring more work than would be required for a simple exposure of the disk for PLIF in order to adequately decompress the neural elements and address the spinal stenosis 2. Posterior lumbar interbody fusion L4-5 L5-S1 using peek interbody cages packed with morcellized allograft and autograft  3. Posterior fixation L4-S1 inclusive using NuVasive cortical pedicle screws.  4. Intertransverse arthrodesis L4 to S1 using morcellized autograft and allograft.  SURGEON:  Marikay Alar, MD  ASSISTANTS: Verlin Dike, FNP  ANESTHESIA:  General  EBL: 100 ml  Total I/O In: 1000 [I.V.:1000] Out: 350 [Urine:250; Blood:100]  BLOOD ADMINISTERED:none  DRAINS: none   INDICATION FOR PROCEDURE: This patient presented with back pain with bilateral leg pain. Imaging revealed a lytic spondylolisthesis at L5-S1 with severe degenerative disease L4-5 with spinal stenosis and foraminal stenosis at both levels.  Had a previous L1-L3 fusion.  The patient tried a reasonable attempt at conservative medical measures without relief. I recommended decompression and instrumented fusion to address the stenosis as well as the segmental  instability.  Patient understood the risks, benefits, and alternatives and potential outcomes and wished to proceed.  PROCEDURE DETAILS:  The patient was brought to the operating room. After induction of generalized endotracheal anesthesia the patient was rolled into the prone position on chest rolls and all pressure points were padded. The patient's lumbar region  was cleaned and then prepped with DuraPrep and draped in the usual sterile fashion. Anesthesia was injected and then a dorsal midline incision was made and carried down to the lumbosacral fascia. The fascia was opened and the paraspinous musculature was taken down in a subperiosteal fashion to expose L4-5 and L5-S1. A self-retaining retractor was placed. Intraoperative fluoroscopy confirmed my level, and I started with placement of the L4 cortical pedicle screws. The pedicle screw entry zones were identified utilizing surface landmarks and  AP and lateral fluoroscopy. I scored the cortex with the high-speed drill and then used the hand drill to drill an upward and outward direction into the pedicle. I then tapped line to line. I then placed a 5.5 x 35 mm cortical pedicle screw into the pedicles of L4 bilaterally.    I then turned my attention to the decompression and complete lumbar laminectomies, hemi- facetectomies, and foraminotomies were performed at L4-5 and L5-S1.  Pars defects at L5 and a Gill type decompression was done at this level.  My nurse practitioner was directly involved in the decompression and exposure of the neural elements. the patient had significant spinal stenosis and this required more work than would be required for a simple exposure of the disc for posterior lumbar interbody fusion which would only require a limited laminotomy. Much more generous decompression and generous foraminotomy was undertaken in order to adequately decompress the neural elements and address the patient's leg pain. The yellow ligament was removed to expose the underlying dura and nerve roots, and generous foraminotomies were performed to adequately decompress the neural elements.  A small unintended durotomy was created close to the midline L5-S1.  This was repaired with a 6-0 Prolene suture.  Valsalva showed no evidence of further CSF leak.  Both  the exiting and traversing nerve roots were decompressed on both  sides until a coronary dilator passed easily along the nerve roots. Once the decompression was complete, I turned my attention to the posterior lower lumbar interbody fusion. The epidural venous vasculature was coagulated and cut sharply. Disc space was incised and the initial discectomy was performed with pituitary rongeurs. The disc space was distracted with sequential distractors to a height of 8 mm at L4-5 and 10 mm at L5-S1. We then used a series of scrapers and shavers to prepare the endplates for fusion. The midline was prepared with Epstein curettes. Once the complete discectomy was finished, we packed an appropriate sized interbody cage with local autograft and morcellized allograft, gently retracted the nerve root, and tapped the cage into position at L4-5 and L5-S1.  The midline between the cages was packed with morselized autograft and allograft.   We then turned our attention to the placement of the lower pedicle screws. The pedicle screw entry zones were identified utilizing surface landmarks and fluoroscopy. I drilled into each pedicle utilizing the hand drill, and tapped each pedicle with the appropriate tap. We palpated with a ball probe to assure no break in the cortex. We then placed 5.5 x 35 mm pedicle screws into the pedicles bilaterally at L5 and 6.5 x 35 mm pedicle screws at S1.  My nurse practitioner assisted in placement of the pedicle screws.  We then decorticated the transverse processes and laid a mixture of morcellized autograft and allograft out over these to perform intertransverse arthrodesis at L4-5 and L5-S1. We then placed lordotic rods into the multiaxial screw heads of the pedicle screws and locked these in position with the locking caps and anti-torque device. We then checked our construct with AP and lateral fluoroscopy. Irrigated with copious amounts of Vashe solution followed by saline solution. Inspected the nerve roots once again to assure adequate decompression, lined to  the dura with fibrin glue and Gelfoam,  and then we closed the muscle and the fascia with 0 Vicryl. Closed the subcutaneous tissues with 2-0 Vicryl and subcuticular tissues with 3-0 Vicryl. The skin was closed with benzoin and Steri-Strips. Dressing was then applied, the patient was awakened from general anesthesia and transported to the recovery room in stable condition. At the end of the procedure all sponge, needle and instrument counts were correct.   PLAN OF CARE: admit to inpatient  PATIENT DISPOSITION:  PACU - hemodynamically stable.   Delay start of Pharmacological VTE agent (>24hrs) due to surgical blood loss or risk of bleeding:  yes

## 2023-10-18 NOTE — Anesthesia Procedure Notes (Signed)
Procedure Name: Intubation Date/Time: 10/18/2023 7:45 AM  Performed by: Hessie Diener, CRNAPre-anesthesia Checklist: Patient identified, Emergency Drugs available, Suction available and Patient being monitored Patient Re-evaluated:Patient Re-evaluated prior to induction Oxygen Delivery Method: Circle System Utilized Preoxygenation: Pre-oxygenation with 100% oxygen Induction Type: IV induction Ventilation: Mask ventilation without difficulty Laryngoscope Size: Mac and 3 Grade View: Grade II Tube type: Oral Tube size: 7.0 mm Number of attempts: 1 Airway Equipment and Method: Stylet and Oral airway Placement Confirmation: ETT inserted through vocal cords under direct vision, positive ETCO2 and breath sounds checked- equal and bilateral Tube secured with: Tape Dental Injury: Teeth and Oropharynx as per pre-operative assessment

## 2023-10-18 NOTE — Plan of Care (Signed)

## 2023-10-18 NOTE — H&P (Signed)
Subjective: Patient is a 73 y.o. female admitted for back an leg pain. Onset of symptoms was several months ago, gradually worsening since that time.  The pain is rated severe, and is located at the across the lower back and radiates to legs. The pain is described as aching and occurs all day. The symptoms have been progressive. Symptoms are exacerbated by exercise, standing, and walking for more than a few minutes. MRI or CT showed spondylolisthesis with stenosis   Past Medical History:  Diagnosis Date   Anxiety    Arthritis    Back pain, chronic    Depression    GERD (gastroesophageal reflux disease)    History of kidney stones 2015   Hyperlipidemia    Hypothyroidism    Skin abnormality 12/07/2021   face red with 2 blisters intact on forehead due to [place] crean tx for skin cancer   Skin Cancer (HCC)    SUI (stress urinary incontinence, female)    wears pads   Thyroid disease    TMJ (temporomandibular joint disorder)    joint has dislocated x 3 in past    Past Surgical History:  Procedure Laterality Date   ABDOMINAL HYSTERECTOMY     age 73, 1 ovary reved at surgery 1 ovary removed later   ANTERIOR LAT LUMBAR FUSION N/A 04/07/2017   Procedure: LUMBAR ONE-TWO ANTERIOR LATERAL LUMBAR FUSION WITH LATERAL PLATE;  Surgeon: Tia Alert, MD;  Location: [facility] OR;  Service: Neurosurgery;  Laterality: N/A;  Right side approach   APPENDECTOMY     age 82   BLADDER SUSPENSION  2013   BLADDER SUSPENSION N/A 12/09/2021   Procedure: TRANSVAGINAL TAPE (TVT) PROCEDURE;  Surgeon: Marguerita Beards, MD;  Location: [facility];  Service: Gynecology;  Laterality: N/A;   CESAREAN SECTION     x 2   CYSTOSCOPY N/A 12/09/2021   Procedure: CYSTOSCOPY;  Surgeon: Marguerita Beards, MD;  Location: [facility];  Service: Gynecology;  Laterality: N/A;   EXCISION OF MESH N/A 03/14/2022   Procedure: Cleon Dew REVISION;  Surgeon: Marguerita Beards, MD;  Location: [facility];  Service: Gynecology;  Laterality: N/A;  total time requested is 1 hour   EXTRACORPOREAL SHOCK WAVE LITHOTRIPSY  2015   HERNIA REPAIR     umbilical yrs ago   LAPAROSCOPIC LYSIS INTESTINAL ADHESIONS     yrs ago   MAXIMUM ACCESS (MAS)POSTERIOR LUMBAR INTERBODY FUSION (PLIF) 1 LEVEL N/A 04/16/2015   Procedure: masPLIF  - L2-L3 ;  Surgeon: Tia Alert, MD;  Location: [facility];  Service: Neurosurgery;  Laterality: N/A;  masPLIF  - L2-L3    SPINE SURGERY     lumbar before 2016 surgery   TONSILLECTOMY AND ADENOIDECTOMY     age 53   TOTAL KNEE ARTHROPLASTY Left 07/17/2018   Procedure: LEFT TOTAL KNEE ARTHROPLASTY;  Surgeon: Durene Romans, MD;  Location: [facility];  Service: Orthopedics;  Laterality: Left;  70 mins    Prior to Admission medications   Medication Sig Start Date End Date Taking? Authorizing Provider  ALPRAZolam (XANAX) 1 MG tablet TAKE 1/2 TO 1 TABLET BY MOUTH EVERY NIGHT AT BEDTIME. LIMIT TO 5 DAYS PER WEEK TO AVOID ADDICTION Patient taking differently: Take 1 mg by mouth at bedtime as needed. TAKE 1/2 TO 1 TABLET BY MOUTH EVERY NIGHT AT BEDTIME. LIMIT TO 5 DAYS PER WEEK TO AVOID ADDICTION 11/23/22  Yes Raynelle Dick, NP  cholecalciferol (VITAMIN D3) 25 MCG (1000  UNIT) tablet Take 2,000 Units by mouth daily.   Yes [provider]  cyanocobalamin (VITAMIN B12) 1000 MCG tablet Take 1,000 mcg by mouth daily as needed (energy).   Yes [provider]  cyclobenzaprine (FLEXERIL) 5 MG tablet Take 5 mg by mouth at bedtime. 10/04/23  Yes [provider]  estradiol (ESTRACE) 1 MG tablet Take 1 mg by mouth at bedtime.   Yes [provider]  FIBER GUMMIES PO Take 1 capsule by mouth at bedtime.   Yes [provider]  GEMTESA 75 MG TABS Take 1 tablet (75 mg total) by mouth daily. Patient taking differently: Take 75 mg by mouth at bedtime. 07/27/23  Yes Selmer Dominion, NP  levothyroxine (SYNTHROID) 88 MCG tablet Take 1 tablet daily  except 1/2 tab on Tues/Sat for thyroid on empty stomach with water only. Patient taking differently: Take 1 tablet daily except 1/2 tab on Tues/Thurs for thyroid on empty stomach with water only. 07/28/21  Yes Judd Gaudier, NP  lidocaine (LIDODERM) 5 % Place 1 patch onto the skin daily. 01/10/23  Yes [provider]  magnesium gluconate (MAGONATE) 500 MG tablet Take 500 mg by mouth at bedtime.   Yes [provider]  naproxen sodium (ALEVE) 220 MG tablet Take 2 tabs twice daily with food as needed for back pain. 03/31/22  Yes Judd Gaudier, NP  pantoprazole (PROTONIX) 40 MG tablet TAKE 1 TABLET(40 MG) BY MOUTH DAILY Patient taking differently: Take 40 mg by mouth daily as needed (heartburn). 04/06/23  Yes Quentin Mulling R, PA-C  TOPAMAX 25 MG tablet Take 25 mg by mouth at bedtime. 06/08/23  Yes [provider]   Allergies  Allergen Reactions   Celebrex [Celecoxib] Other (See Comments)    "panic attack"   Other Other (See Comments)    Skin bruises very easily and peels back   Gabapentin     sedation    Lyrica [Pregabalin]     sedation   Zofran [Ondansetron] Other (See Comments)    Headache   Entex Lq [Phenylephrine-Guaifenesin] Rash   Fiorinal [Butalbital-Aspirin-Caffeine] Rash    Social History   Tobacco Use   Smoking status: Never   Smokeless tobacco: Never  Substance Use Topics   Alcohol use: No    Family History  Problem Relation Age of Onset   Heart disease Mother    Hyperlipidemia Mother    Thyroid disease Mother    Asthma Sister    Cancer Maternal Grandmother        ovarian   Colon cancer Neg Hx    Rectal cancer Neg Hx    Stomach cancer Neg Hx    Diabetes Neg Hx      Review of Systems  Positive ROS: neg  All other systems have been reviewed and were otherwise negative with the exception of those mentioned in the HPI and as above.  Objective: Vital signs in last 24 hours: Temp:  [97.7 F (36.5 C)] 97.7 F (36.5 C) (01/29  0553) Pulse Rate:  [88] 88 (01/29 0553) Resp:  [18] 18 (01/29 0553) BP: (113)/(53) 113/53 (01/29 0553) SpO2:  [98 %] 98 % (01/29 0553) Weight:  [61.2 kg] 61.2 kg (01/29 0553)  General Appearance: Alert, cooperative, no distress, appears stated age Head: Normocephalic, without obvious abnormality, atraumatic Eyes: PERRL, conjunctiva/corneas clear, EOM's intact    Neck: Supple, symmetrical, trachea midline Back: Symmetric, no curvature, ROM normal, no CVA tenderness Lungs:  respirations unlabored Heart: Regular rate and rhythm Abdomen: Soft, non-tender  Extremities: Extremities normal, atraumatic, no cyanosis or edema Pulses: 2+ and symmetric all extremities Skin: Skin color, texture, turgor normal, no rashes or lesions  NEUROLOGIC:   Mental status: Alert and oriented x4,  no aphasia, good attention span, fund of knowledge, and memory Motor Exam - grossly normal Sensory Exam - grossly normal Reflexes: 1+ Coordination - grossly normal Gait - grossly normal Balance - grossly normal Cranial Nerves: I: smell Not tested  II: visual acuity  OS: nl    OD: nl  II: visual fields Full to confrontation  II: pupils Equal, round, reactive to light  III,VII: ptosis None  III,IV,VI: extraocular muscles  Full ROM  V: mastication Normal  V: facial light touch sensation  Normal  V,VII: corneal reflex  Present  VII: facial muscle function - upper  Normal  VII: facial muscle function - lower Normal  VIII: hearing Not tested  IX: soft palate elevation  Normal  IX,X: gag reflex Present  XI: trapezius strength  5/5  XI: sternocleidomastoid strength 5/5  XI: neck flexion strength  5/5  XII: tongue strength  Normal    Data Review Lab Results  Component Value Date   WBC 4.2 10/13/2023   HGB 12.5 10/13/2023   HCT 37.0 10/13/2023   MCV 91.6 10/13/2023   PLT 349 10/13/2023   Lab Results  Component Value Date   NA 140 10/13/2023   K 3.8 10/13/2023   CL 108 10/13/2023   CO2 21 (L)  10/13/2023   BUN 11 10/13/2023   CREATININE 0.76 10/13/2023   GLUCOSE 109 (H) 10/13/2023   Lab Results  Component Value Date   INR 1.0 10/13/2023    Assessment/Plan:  Estimated body mass index is 26.37 kg/m as calculated from the following:   Height as of this encounter: 5' (1.524 m).   Weight as of this encounter: 61.2 kg. Patient admitted for PLIF L4-5 L5-S1. Patient has failed a reasonable attempt at conservative therapy.  I explained the condition and procedure to the patient and answered any questions.  Patient wishes to proceed with procedure as planned. Understands risks/ benefits and typical outcomes of procedure.   Tia Alert 10/18/2023 7:17 AM

## 2023-10-18 NOTE — Transfer of Care (Signed)
Immediate Anesthesia Transfer of Care Note  Patient: Monique Thomas  Procedure(s) Performed: Lumbar Four-Five, Lumbar Five-Sacral One Posterior Lateral and Interbody fusion (Spine Lumbar)  Patient Location: PACU  Anesthesia Type:General  Level of Consciousness: drowsy  Airway & Oxygen Therapy: Patient connected to face mask oxygen  Post-op Assessment: Post -op Vital signs reviewed and stable  Post vital signs: stable  Last Vitals:  Vitals Value Taken Time  BP 107/53 10/18/23 1201  Temp    Pulse 65 10/18/23 1203  Resp 14 10/18/23 1203  SpO2 100 % 10/18/23 1203  Vitals shown include unfiled device data.  Last Pain:  Vitals:   10/18/23 0622  TempSrc:   PainSc: 8          Complications: There were no known notable events for this encounter.

## 2023-10-19 DIAGNOSIS — Z96652 Presence of left artificial knee joint: Secondary | ICD-10-CM | POA: Diagnosis not present

## 2023-10-19 DIAGNOSIS — E039 Hypothyroidism, unspecified: Secondary | ICD-10-CM | POA: Diagnosis not present

## 2023-10-19 DIAGNOSIS — Z79899 Other long term (current) drug therapy: Secondary | ICD-10-CM | POA: Diagnosis not present

## 2023-10-19 DIAGNOSIS — M5117 Intervertebral disc disorders with radiculopathy, lumbosacral region: Secondary | ICD-10-CM | POA: Diagnosis not present

## 2023-10-19 DIAGNOSIS — Z85828 Personal history of other malignant neoplasm of skin: Secondary | ICD-10-CM | POA: Diagnosis not present

## 2023-10-19 DIAGNOSIS — M4807 Spinal stenosis, lumbosacral region: Secondary | ICD-10-CM | POA: Diagnosis not present

## 2023-10-19 DIAGNOSIS — M4317 Spondylolisthesis, lumbosacral region: Secondary | ICD-10-CM | POA: Diagnosis not present

## 2023-10-19 MED ORDER — CYCLOBENZAPRINE HCL 5 MG PO TABS: 5.0000 mg | ORAL_TABLET | Freq: Three times a day (TID) | ORAL | 2 refills | Status: AC | PRN

## 2023-10-19 MED ORDER — OXYCODONE HCL 5 MG PO TABS: 5.0000 mg | ORAL_TABLET | ORAL | 0 refills | Status: DC | PRN

## 2023-10-19 NOTE — Anesthesia Postprocedure Evaluation (Signed)
Anesthesia Post Note  Patient: Monique Thomas  Procedure(s) Performed: Lumbar Four-Five, Lumbar Five-Sacral One Posterior Lateral and Interbody fusion (Spine Lumbar)     Patient location during evaluation: PACU Anesthesia Type: General Level of consciousness: awake and alert Pain management: pain level controlled Vital Signs Assessment: post-procedure vital signs reviewed and stable Respiratory status: spontaneous breathing, nonlabored ventilation, respiratory function stable and patient connected to nasal cannula oxygen Cardiovascular status: blood pressure returned to baseline and stable Postop Assessment: no apparent nausea or vomiting Anesthetic complications: no   There were no known notable events for this encounter.         Mariann Barter

## 2023-10-19 NOTE — Evaluation (Signed)
Physical Therapy Evaluation  Patient Details Name: Monique Thomas MRN: [id number] DOB: 1951/02/10 Today's Date: 10/19/2023  History of Present Illness  Pt is a 73 y/o female admitted for decompressive lumbar laminectomy at L4-5 and L5-S1 and posterior interbody fusion at L4-5 and L5-S1 in setting of severe DDD, herniation and stenosis at surgical levels. PMH: anxiety, depression, GERD, kidney stones, HLD, hypothyroidism, skin cancer, TMJ, L TKA, lumbar fusion L2-3   Clinical Impression  Pt admitted with above diagnosis. At the time of PT eval, pt was able to demonstrate transfers and ambulation with gross supervision for safety and RW for support. Pt was educated on precautions, brace application/wearing schedule, appropriate activity progression, and car transfer. Pt currently with functional limitations due to the deficits listed below (see PT Problem List). Pt will benefit from skilled PT to increase their independence and safety with mobility to allow discharge to the venue listed below.          If plan is discharge home, recommend the following: A little help with walking and/or transfers;A little help with bathing/dressing/bathroom;Assistance with cooking/housework;Assist for transportation   Can travel by private vehicle        Equipment Recommendations None recommended by PT  Recommendations for Other Services       Functional Status Assessment Patient has had a recent decline in their functional status and demonstrates the ability to make significant improvements in function in a reasonable and predictable amount of time.     Precautions / Restrictions Precautions Precautions: Back Precaution Booklet Issued: Yes (comment) Precaution Comments: Reviewed handout and pt was cued for precautions during functional mobility. Required Braces or Orthoses: Spinal Brace Spinal Brace: Lumbar corset;Applied in sitting position Restrictions Weight Bearing Restrictions Per Provider Order:  No      Mobility  Bed Mobility               General bed mobility comments: Verbally reviewed log roll technique.    Transfers Overall transfer level: Modified independent Equipment used: Rolling walker (2 wheels)               General transfer comment: VC's for hand placement on seated surface for safety.    Ambulation/Gait Ambulation/Gait assistance: Supervision Gait Distance (Feet): 500 Feet Assistive device: Rolling walker (2 wheels) Gait Pattern/deviations: Step-through pattern, Decreased stride length, Decreased weight shift to left, Decreased step length - left, Trunk flexed Gait velocity: Decreased Gait velocity interpretation: <1.31 ft/sec, indicative of household ambulator   General Gait Details: VC's for improved posture, closer walker proximity and forward gaze.  Stairs Stairs: Yes Stairs assistance: Contact guard assist Stair Management: Two rails, Step to pattern, Forwards Number of Stairs: 3 General stair comments: VC's for sequencing and general safety.  Wheelchair Mobility     Tilt Bed    Modified Rankin (Stroke Patients Only)       Balance Overall balance assessment: No apparent balance deficits (not formally assessed)                                           Pertinent Vitals/Pain Pain Assessment Pain Assessment: No/denies pain    Home Living Family/patient expects to be discharged to:: Private residence Living Arrangements: Spouse/significant other Available Help at Discharge: Family;Available 24 hours/day Type of Home: House Home Access: Stairs to enter Entrance Stairs-Rails: Lawyer of Steps: 3   Home Layout: One level Home  Equipment: Agricultural consultant (2 wheels);Cane - single point;Hand held shower head;Adaptive equipment      Prior Function Prior Level of Function : Independent/Modified Independent;Driving             Mobility Comments: no AD for mobility ADLs  Comments: Indep with ADLs, IADLs, enjoys being active     Extremity/Trunk Assessment   Upper Extremity Assessment Upper Extremity Assessment: Defer to OT evaluation    Lower Extremity Assessment Lower Extremity Assessment: Generalized weakness (L worse than R but baseline)    Cervical / Trunk Assessment Cervical / Trunk Assessment: Back Surgery  Communication   Communication Communication: No apparent difficulties Cueing Techniques: Verbal cues  Cognition Arousal: Alert Behavior During Therapy: WFL for tasks assessed/performed Overall Cognitive Status: Within Functional Limits for tasks assessed                                          General Comments      Exercises     Assessment/Plan    PT Assessment Patient needs continued PT services  PT Problem List Decreased strength;Decreased balance;Decreased activity tolerance;Decreased mobility;Decreased knowledge of use of DME;Decreased knowledge of precautions;Decreased safety awareness;Pain       PT Treatment Interventions DME instruction;Gait training;Functional mobility training;Stair training;Therapeutic activities;Therapeutic exercise;Balance training;Patient/family education    PT Goals (Current goals can be found in the Care Plan section)  Acute Rehab PT Goals Patient Stated Goal: Home today PT Goal Formulation: With patient/family Time For Goal Achievement: 10/26/23 Potential to Achieve Goals: Good    Frequency Min 5X/week     Co-evaluation               AM-PAC PT "6 Clicks" Mobility  Outcome Measure Help needed turning from your back to your side while in a flat bed without using bedrails?: None Help needed moving from lying on your back to sitting on the side of a flat bed without using bedrails?: None Help needed moving to and from a bed to a chair (including a wheelchair)?: A Little Help needed standing up from a chair using your arms (e.g., wheelchair or bedside chair)?: A  Little Help needed to walk in hospital room?: A Little Help needed climbing 3-5 steps with a railing? : A Little 6 Click Score: 20    End of Session Equipment Utilized During Treatment: Gait belt;Back brace Activity Tolerance: Patient tolerated treatment well Patient left: in bed;with call bell/phone within reach;with family/visitor present Nurse Communication: Mobility status PT Visit Diagnosis: Unsteadiness on feet (R26.81);Pain Pain - part of body:  (back)    Time: 4098-1191 PT Time Calculation (min) (ACUTE ONLY): 23 min   Charges:   PT Evaluation $PT Eval Low Complexity: 1 Low PT Treatments $Gait Training: 8-22 mins PT General Charges $$ ACUTE PT VISIT: 1 Visit         Conni Slipper, PT, DPT Acute Rehabilitation Services Secure Chat Preferred Office: [phone]   Marylynn Pearson 10/19/2023, 9:24 AM

## 2023-10-19 NOTE — Discharge Summary (Signed)
Physician Discharge Summary  Patient ID: Monique Thomas MRN: [id number] DOB/AGE: 73-Mar-1952 73 y.o.  Admit date: 10/18/2023 Discharge date: 10/19/2023  Admission Diagnoses: lytic spondylolisthesis with stenosis    Discharge Diagnoses: same   Discharged Condition: good  Hospital Course: The patient was admitted on 10/18/2023 and taken to the operating room where the patient underwent PLIF L4-5 L5-S1. The patient tolerated the procedure well and was taken to the recovery room and then to the floor in stable condition. The hospital course was routine. There were no complications. The wound remained clean dry and intact. Pt had appropriate back soreness. No complaints of leg pain or new N/T/W. The patient remained afebrile with stable vital signs, and tolerated a regular diet. The patient continued to increase activities, and pain was well controlled with oral pain medications.   Consults: None  Significant Diagnostic Studies:  Results for orders placed or performed during the hospital encounter of 10/13/23  Surgical pcr screen   Collection Time: 10/13/23  8:23 AM   Specimen: Nasal Mucosa; Nasal Swab  Result Value Ref Range   MRSA, PCR NEGATIVE NEGATIVE   Staphylococcus aureus NEGATIVE NEGATIVE  Type and screen [facility]   Collection Time: 10/13/23  8:35 AM  Result Value Ref Range   ABO/RH(D) B POS    Antibody Screen NEG    Sample Expiration 10/27/2023,2359    Extend sample reason      NO TRANSFUSIONS OR PREGNANCY IN THE PAST 3 MONTHS Performed at [facility], [address], [place], [place] [address]   CBC per protocol   Collection Time: 10/13/23  9:00 AM  Result Value Ref Range   WBC 4.2 4.0 - 10.5 K/uL   RBC 4.04 3.87 - 5.11 MIL/uL   Hemoglobin 12.5 12.0 - 15.0 g/dL   HCT 19.1 47.8 - 29.5 %   MCV 91.6 80.0 - 100.0 fL   MCH 30.9 26.0 - 34.0 pg   MCHC 33.8 30.0 - 36.0 g/dL   RDW 62.1 30.8 - 65.7 %   Platelets 349 150 - 400 K/uL   nRBC 0.0 0.0 - 0.2  %  Basic metabolic panel per protocol   Collection Time: 10/13/23  9:00 AM  Result Value Ref Range   Sodium 140 135 - 145 mmol/L   Potassium 3.8 3.5 - 5.1 mmol/L   Chloride 108 98 - 111 mmol/L   CO2 21 (L) 22 - 32 mmol/L   Glucose, Bld 109 (H) 70 - 99 mg/dL   BUN 11 8 - 23 mg/dL   Creatinine, Ser 8.46 0.44 - 1.00 mg/dL   Calcium 9.0 8.9 - 96.2 mg/dL   GFR, Estimated >95 >28 mL/min   Anion gap 11 5 - 15  PT-INR at PAT visit (Pre-admission Testing) per protocol   Collection Time: 10/13/23  9:00 AM  Result Value Ref Range   Prothrombin Time 13.5 11.4 - 15.2 seconds   INR 1.0 0.8 - 1.2    DG Lumbar Spine 2-3 Views Result Date: 10/18/2023 CLINICAL DATA:  Elective surgery. EXAM: LUMBAR SPINE - 2-3 VIEW COMPARISON:  Radiograph 12/29/2022 FINDINGS: Two fluoroscopic spot views in the operating room submitted. There is new posterior rod with intrapedicular screw fusion L4 through S1 with interbody spacers. The previous L2-L3 and L3-L4 hardware is partially included in the field of view. Fluoroscopy time 1:05 0.3 seconds. Dose 71.1 mGy. IMPRESSION: Intraoperative fluoroscopy during lumbar fusion. Electronically Signed   By: Narda Rutherford M.D.   On: 10/18/2023 12:40  DG C-Arm 1-60 Min-No Report Result Date: 10/18/2023 Fluoroscopy was utilized by the requesting physician.  No radiographic interpretation.   DG C-Arm 1-60 Min-No Report Result Date: 10/18/2023 Fluoroscopy was utilized by the requesting physician.  No radiographic interpretation.   DG C-Arm 1-60 Min-No Report Result Date: 10/18/2023 Fluoroscopy was utilized by the requesting physician.  No radiographic interpretation.   DG C-Arm 1-60 Min-No Report Result Date: 10/18/2023 Fluoroscopy was utilized by the requesting physician.  No radiographic interpretation.    Antibiotics:  Anti-infectives (From admission, onward)    Start     Dose/Rate Route Frequency Ordered Stop   10/18/23 1600  ceFAZolin (ANCEF) IVPB 2g/100 mL premix         2 g 200 mL/hr over 30 Minutes Intravenous Every 8 hours 10/18/23 1356 10/18/23 2340   10/18/23 0600  ceFAZolin (ANCEF) IVPB 2g/100 mL premix        2 g 200 mL/hr over 30 Minutes Intravenous On call to O.R. 10/18/23 0549 10/18/23 0751       Discharge Exam: Blood pressure (!) 118/52, pulse 75, temperature 97.7 F (36.5 C), temperature source Oral, resp. rate 16, height 5' (1.524 m), weight 61.2 kg, SpO2 99%. Neurologic: Grossly normal Dressing dry  Discharge Medications:   Allergies as of 10/19/2023       Reactions   Celebrex [celecoxib] Other (See Comments)   "panic attack"   Other Other (See Comments)   Skin bruises very easily and peels back   Gabapentin    sedation   Lyrica [pregabalin]    sedation   Zofran [ondansetron] Other (See Comments)   Headache   Entex Lq [phenylephrine-guaifenesin] Rash   Fiorinal [butalbital-aspirin-caffeine] Rash        Medication List     TAKE these medications    ALPRAZolam 1 MG tablet Commonly known as: XANAX TAKE 1/2 TO 1 TABLET BY MOUTH EVERY NIGHT AT BEDTIME. LIMIT TO 5 DAYS PER WEEK TO AVOID ADDICTION What changed:  how much to take how to take this when to take this reasons to take this   cholecalciferol 25 MCG (1000 UNIT) tablet Commonly known as: VITAMIN D3 Take 2,000 Units by mouth daily.   cyanocobalamin 1000 MCG tablet Commonly known as: VITAMIN B12 Take 1,000 mcg by mouth daily as needed (energy).   cyclobenzaprine 5 MG tablet Commonly known as: FLEXERIL Take 1 tablet (5 mg total) by mouth 3 (three) times daily as needed for muscle spasms. What changed:  when to take this reasons to take this   estradiol 1 MG tablet Commonly known as: ESTRACE Take 1 mg by mouth at bedtime.   FIBER GUMMIES PO Take 1 capsule by mouth at bedtime.   Gemtesa 75 MG Tabs Generic drug: Vibegron Take 1 tablet (75 mg total) by mouth daily. What changed: when to take this   levothyroxine 88 MCG tablet Commonly known as:  Synthroid Take 1 tablet daily except 1/2 tab on Tues/Sat for thyroid on empty stomach with water only. What changed: additional instructions   lidocaine 5 % Commonly known as: LIDODERM Place 1 patch onto the skin daily.   magnesium gluconate 500 MG tablet Commonly known as: MAGONATE Take 500 mg by mouth at bedtime.   naproxen sodium 220 MG tablet Commonly known as: Aleve Take 2 tabs twice daily with food as needed for back pain.   oxyCODONE 5 MG immediate release tablet Commonly known as: Oxy IR/ROXICODONE Take 1 tablet (5 mg total) by mouth every 4 (four) hours as  needed for moderate pain (pain score 4-6).   pantoprazole 40 MG tablet Commonly known as: PROTONIX TAKE 1 TABLET(40 MG) BY MOUTH DAILY What changed: See the new instructions.   Topamax 25 MG tablet Generic drug: topiramate Take 25 mg by mouth at bedtime.               Durable Medical Equipment  (From admission, onward)           Start     Ordered   10/18/23 1357  DME Walker rolling  Once       Question:  Patient needs a walker to treat with the following condition  Answer:  S/P lumbar fusion   10/18/23 1356   10/18/23 1357  DME 3 n 1  Once        10/18/23 1356            Disposition: home   Final Dx: PLIF L4-5 L5-S1  Discharge Instructions      Remove dressing in 72 hours   Complete by: As directed    Call MD for:  difficulty breathing, headache or visual disturbances   Complete by: As directed    Call MD for:  persistant nausea and vomiting   Complete by: As directed    Call MD for:  redness, tenderness, or signs of infection (pain, swelling, redness, odor or green/yellow discharge around incision site)   Complete by: As directed    Call MD for:  severe uncontrolled pain   Complete by: As directed    Call MD for:  temperature >100.4   Complete by: As directed    Diet - low sodium heart healthy   Complete by: As directed    Increase activity slowly   Complete by: As directed            Signed: Tia Alert 10/19/2023, 7:42 AM

## 2023-10-19 NOTE — Evaluation (Signed)
Occupational Therapy Evaluation/Discharge Patient Details Name: Monique Thomas MRN: [id number] DOB: [birth year] Today's Date: 10/19/2023   History of Present Illness Pt is a 73 y/o female admitted for decompressive lumbar laminectomy at L4-5 and L5-S1 and posterior interbody fusion at L4-5 and L5-S1 in setting of severe DDD, herniation and stenosis at surgical levels. PMH: anxiety, depression, GERD, kidney stones, HLD, hypothyroidism, skin cancer, TMJ, L TKA, lumbar fusion L2-3   Clinical Impression   PTA, pt lives with spouse and typically completely independent in all ADLs, IADLs and mobility without AD though limited by progressive back pain. Pt presents now post op with pain well controlled and familiar with back precautions. Collaborated with pt/spouse re: spinal precautions for ADLs, bed mobility, IADL mgmt and LSO mgmt. Pt able to return demo all ADLs and transfers Independently after education. Husband able to assist as needed at DC. No further skilled OT services needed at this time.        If plan is discharge home, recommend the following: Assistance with cooking/housework;Assist for transportation    Functional Status Assessment  Patient has not had a recent decline in their functional status  Equipment Recommendations  None recommended by OT    Recommendations for Other Services       Precautions / Restrictions Precautions Precautions: Back Precaution Booklet Issued: Yes (comment) Required Braces or Orthoses: Spinal Brace Spinal Brace: Lumbar corset;Applied in sitting position Restrictions Weight Bearing Restrictions Per Provider Order: No      Mobility Bed Mobility Overal bed mobility: Modified Independent             General bed mobility comments: cues for technique for log rolling with pt able to return demo without assist    Transfers Overall transfer level: Independent Equipment used: None                      Balance Overall balance  assessment: No apparent balance deficits (not formally assessed)                                         ADL either performed or assessed with clinical judgement   ADL Overall ADL's : Independent                                       General ADL Comments: educated re: back precations for dressing, showering techniques, oral care at sink, body mechanics with IADLs, back brace mgmt and wearing recs and frequent mobility importance. Pt able to return demo full dressing task and LSO  brace mgmt with minor cues for strategies. Husband present and reports able to assist as needed. Discussed truck transfer techniques and collab with nursing to answer pt's question regarding heat vs ice after surgery     Vision Ability to See in Adequate Light: 0 Adequate Patient Visual Report: No change from baseline Vision Assessment?: No apparent visual deficits     Perception         Praxis         Pertinent Vitals/Pain Pain Assessment Pain Assessment: No/denies pain     Extremity/Trunk Assessment Upper Extremity Assessment Upper Extremity Assessment: Overall WFL for tasks assessed;Right hand dominant   Lower Extremity Assessment Lower Extremity Assessment: Defer to PT evaluation   Cervical / Trunk Assessment Cervical / Trunk  Assessment: Back Surgery   Communication Communication Communication: No apparent difficulties Cueing Techniques: Verbal cues   Cognition Arousal: Alert Behavior During Therapy: WFL for tasks assessed/performed Overall Cognitive Status: Within Functional Limits for tasks assessed                                       General Comments       Exercises     Shoulder Instructions      Home Living Family/patient expects to be discharged to:: Private residence Living Arrangements: Spouse/significant other Available Help at Discharge: Family;Available 24 hours/day Type of Home: House Home Access: Stairs to  enter [employer] of Steps: 3 Entrance Stairs-Rails: Left;Right Home Layout: One level     Bathroom Shower/Tub: Tub/shower unit;Walk-in shower   Bathroom Toilet: Standard Bathroom Accessibility: Yes   Home Equipment: Agricultural consultant (2 wheels);Cane - single point;Hand held shower head;Adaptive equipment Adaptive Equipment: Reacher;Long-handled sponge        Prior Functioning/Environment Prior Level of Function : Independent/Modified Independent;Driving             Mobility Comments: no AD for mobility ADLs Comments: Indep with ADLs, IADLs, enjoys being active        OT Problem List:        OT Treatment/Interventions:      OT Goals(Current goals can be found in the care plan section) Acute Rehab OT Goals Patient Stated Goal: home today, heal well and be able to soak in tub (when approved by surgeon) OT Goal Formulation: All assessment and education complete, DC therapy  OT Frequency:      Co-evaluation              AM-PAC OT "6 Clicks" Daily Activity     Outcome Measure Help from another person eating meals?: None Help from another person taking care of personal grooming?: None Help from another person toileting, which includes using toliet, bedpan, or urinal?: None Help from another person bathing (including washing, rinsing, drying)?: None Help from another person to put on and taking off regular upper body clothing?: None Help from another person to put on and taking off regular lower body clothing?: None 6 Click Score: 24   End of Session Equipment Utilized During Treatment: Back brace Nurse Communication: Mobility status  Activity Tolerance: Patient tolerated treatment well Patient left: in bed;with call bell/phone within reach;with family/visitor present  OT Visit Diagnosis: Other abnormalities of gait and mobility (R26.89)                Time: 0726-0800 OT Time Calculation (min): 34 min Charges:  OT General Charges $OT Visit: 1  Visit OT Evaluation $OT Eval Low Complexity: 1 Low OT Treatments $Self Care/Home Management : 8-22 mins  Bradd Canary, OTR/L Acute Rehab Services Office: [phone]   Lorre Munroe 10/19/2023, 8:17 AM

## 2023-10-19 NOTE — Plan of Care (Signed)
Pt doing well. Pt and husband given D/C instructions with verbal understanding. Rx's were sent to the pharmacy by MD. Pt's incision is clean and dry with no sign of infections. Pt's IV was removed prior to D/C. Pt D/C'd home via wheelchair per MD order. Pt is stable @ D/C and has no other needs at this time. Rema Fendt, RN

## 2023-10-23 ENCOUNTER — Telehealth: Payer: Self-pay | Admitting: Nurse Practitioner

## 2023-10-23 NOTE — Telephone Encounter (Signed)
Patient called in to get an appointment but we are all full. I offered her an appointment for tomorrow, but she did not want it. She is requesting a nurse call her back. Her bladder is unconformable. Every time she moves, she will pee.

## 2023-11-14 ENCOUNTER — Other Ambulatory Visit: Payer: Self-pay | Admitting: Nurse Practitioner

## 2023-11-14 DIAGNOSIS — F419 Anxiety disorder, unspecified: Secondary | ICD-10-CM

## 2023-11-14 MED ORDER — ALPRAZOLAM 1 MG PO TABS: ORAL_TABLET | ORAL | 0 refills | Status: AC

## 2023-11-24 ENCOUNTER — Encounter: Payer: Self-pay | Admitting: Obstetrics and Gynecology

## 2023-11-24 ENCOUNTER — Ambulatory Visit: Payer: Medicare PPO | Admitting: Obstetrics and Gynecology

## 2023-11-24 VITALS — BP 129/76 | HR 84

## 2023-11-24 DIAGNOSIS — R350 Frequency of micturition: Secondary | ICD-10-CM

## 2023-11-24 DIAGNOSIS — N3281 Overactive bladder: Secondary | ICD-10-CM | POA: Diagnosis not present

## 2023-11-24 LAB — POCT URINALYSIS DIPSTICK
Bilirubin, UA: NEGATIVE
Blood, UA: NEGATIVE
Glucose, UA: NEGATIVE
Ketones, UA: NEGATIVE
Leukocytes, UA: NEGATIVE
Nitrite, UA: NEGATIVE
Protein, UA: NEGATIVE
Spec Grav, UA: 1.02 (ref 1.010–1.025)
Urobilinogen, UA: 0.2 U/dL
pH, UA: 7 (ref 5.0–8.0)

## 2023-11-24 NOTE — Progress Notes (Signed)
 [facility] Return Visit  SUBJECTIVE  History of Present Illness: Monique Thomas is a 73 y.o. female seen in follow-up for OAB.   Had back surgery a few weeks ago. When she got home, she leaked out all over herself. Leaking small amounts on the way to the bathroom, a few times per day. She is urinating about 5 times per day, but still has an urge when she's done. She is walking with a cane and her mobility is slower.  She denies burning with urination. She did have a catheter during her surgery. Patient has been taking Gemtesa 75mg  daily.   Drinks: 1 cup coffee, 2 bottles of water per day, 1 glass tea  Surgery: s/p midurethral sling, cystoscopy on 12/09/21 and sling revision, cystoscopy for vaginal mesh exposure on 03/14/22.  Past Medical History: Patient  has a past medical history of Anxiety, Arthritis, Back pain, chronic, Depression, GERD (gastroesophageal reflux disease), History of kidney stones (2015), Hyperlipidemia, Hypothyroidism, Skin abnormality (12/07/2021), Skin Cancer (HCC), SUI (stress urinary incontinence, female), Thyroid disease, and TMJ (temporomandibular joint disorder).   Past Surgical History: She  has a past surgical history that includes Abdominal hysterectomy; Cesarean section; Hernia repair; Appendectomy; Tonsillectomy and adenoidectomy; Spine surgery; Laparoscopic lysis intestinal adhesions; Maximum access (mas)posterior lumbar interbody fusion (plif) 1 level (N/A, 04/16/2015); Anterior lat lumbar fusion (N/A, 04/07/2017); Total knee arthroplasty (Left, 07/17/2018); Extracorporeal shock wave lithotripsy (2015); Bladder suspension (2013); Bladder suspension (N/A, 12/09/2021); Cystoscopy (N/A, 12/09/2021); and Excision of mesh (N/A, 03/14/2022).   Medications: She has a current medication list which includes the following prescription(s): alprazolam, cholecalciferol, cyanocobalamin, cyclobenzaprine, estradiol, fiber, gemtesa, levothyroxine, lidocaine, magnesium  gluconate, naproxen sodium, oxycodone, pantoprazole, and topamax.   Allergies: Patient is allergic to celebrex [celecoxib], other, gabapentin, lyrica [pregabalin], zofran [ondansetron], entex lq [phenylephrine-guaifenesin], and fiorinal [butalbital-aspirin-caffeine].   Social History: Patient  reports that she has never smoked. She has never used smokeless tobacco. She reports that she does not drink alcohol and does not use drugs.      OBJECTIVE     Physical Exam: Vitals:   11/24/23 0858  BP: 129/76  Pulse: 84   Gen: No apparent distress, A&O x 3.  Detailed Urogynecologic Evaluation:  Deferred.    Results for orders placed or performed in visit on 11/24/23  POCT Urinalysis Dipstick   Collection Time: 11/24/23  9:43 AM  Result Value Ref Range   Color, UA Yellow    Clarity, UA Slightly Cloudy    Glucose, UA Negative Negative   Bilirubin, UA Negative    Ketones, UA Negative    Spec Grav, UA 1.020 1.010 - 1.025   Blood, UA Negative    pH, UA 7.0 5.0 - 8.0   Protein, UA Negative Negative   Urobilinogen, UA 0.2 0.2 or 1.0 E.U./dL   Nitrite, UA Negative    Leukocytes, UA Negative Negative   Appearance     Odor      ASSESSMENT AND PLAN    Monique Thomas is a 73 y.o. with:  1. Overactive bladder   2. Urinary frequency     - Symptoms well controlled prior to her surgery.  - Discussed that symptoms are likely exacerbated due to back surgery. Also likely that she is moving slower than before so taking longer to get to the bathroom when she has an urge. Would not recommend any treatment changes at this time. Has already tried myrbetriq, and not a guarantee that trospium would be any better than the [place] that  she is already on.  - We discussed third line therapies of PTNS, Sacral nerve stimulation and botox and she is not interested in these. Can also try pelvic PT but would wait until she is farther out from surgery and has gained back some of her mobility.  - In the meantime,  limit caffeine intake as this can be exacerbating bladder irritation as well.   Follow up 6 weeks  Marguerita Beards, MD

## 2024-01-25 ENCOUNTER — Encounter: Payer: Self-pay | Admitting: Obstetrics and Gynecology

## 2024-01-25 DIAGNOSIS — N3281 Overactive bladder: Secondary | ICD-10-CM

## 2024-01-29 MED ORDER — TROSPIUM CHLORIDE ER 60 MG PO CP24: 1.0000 | ORAL_CAPSULE | Freq: Every day | ORAL | 11 refills | Status: AC

## 2024-02-02 ENCOUNTER — Other Ambulatory Visit: Payer: Self-pay | Admitting: Neurological Surgery

## 2024-02-02 DIAGNOSIS — R292 Abnormal reflex: Secondary | ICD-10-CM

## 2024-02-07 ENCOUNTER — Encounter: Payer: Self-pay | Admitting: Neurological Surgery

## 2024-02-15 ENCOUNTER — Ambulatory Visit
Admission: RE | Admit: 2024-02-15 | Discharge: 2024-02-15 | Disposition: A | Discharge status: Home / Self Care | Source: Ambulatory Visit | Attending: Neurological Surgery | Admitting: Neurological Surgery

## 2024-02-15 DIAGNOSIS — R292 Abnormal reflex: Secondary | ICD-10-CM

## 2024-03-05 NOTE — Progress Notes (Signed)
 03/06/2024 Monique Thomas [id number] 08-22-1951  Referring provider: Vangie Genet, MD Primary GI doctor: Dr. Willy Harvest  ASSESSMENT AND PLAN:  Gastroesophageal reflux disease with large hiatal hernia 6 cm EGD - 02/09/2023 Tortuous esophagus. - 6 cm hiatal hernia. - Gastritis. Biopsied. Antrum and Body/ Fundus - ant/ post, lesser/ greater curves in both ( and incisura) - Gastroesophageal flap valve classified as Hill Grade IV ( no fold, wide open lumen, hiatal hernia present) . - Normal examined duodenum. Large hiatal hernia causing nausea with risk of ulcers and Cameron erosions. - Continue pantoprazole  once daily. - Discuss potential surgical evaluation if symptoms worsen.  Hemorrhoids Painful to sit, started after back surgery in Jan Prolapsed internal hemorrhoid causing significant discomfort and pressure. Conservative treatment preferred. - Order abdominal x-ray to assess stool burden. - Prescribe hydrocortisone suppositories. - Consider hemorrhoidal banding if conservative treatment is ineffective. - Schedule follow-up to assess response.  LLQ pain with history of chronic idiopathic constipation, no fever chills, some lower back pain even after surgery in Jan Pretty well-controlled with diet and Colace every other day per patient Feels complete Bm's, has nausea, has had adhesions in the past 01/2023 colonosocpy diverticulosis sigmoid - Increase fiber/ water  intake, decrease caffeine, increase activity level. -Will get KUB, consider CT AB and pelvis with contrast - check CBC, CMET, ESR -possible component of pelvis floor dysfunction with history and symptoms.  -Refer to pelvic floor PT  Anemia, unspecified type 10/13/2023  HGB 12.5 MCV 91.6 Platelets 349 01/09/2023 Iron 50 Ferritin 15.6 B12 >2,000 Recent Labs    05/30/23 1116 10/13/23 0900  HGB 12.9 12.5   Screen for colon cancer Cologuard 08/02/2021 negative 01/2023 colonosocpy diverticulosis sigmoid   Patient  Care Team: Vangie Genet, MD as PCP - General (Internal Medicine) Garlin Junker, MD as Referring Physician (Gastroenterology) Bonita Bussing, MD as Consulting Physician (Obstetrics and Gynecology) Annamarie Kid, MD as Consulting Physician (Urology) Maud Sorenson, PA-C as Physician Assistant (Orthopedic Surgery) Devon Fogo, MD (Inactive) as Consulting Physician (Dermatology) Kenney Peacemaker, MD as Consulting Physician (Gastroenterology) Dale Dubonnet, OD as Referring Physician (Optometry) Arma Lamp, MD as Consulting Physician (Obstetrics and Gynecology)  HISTORY OF PRESENT ILLNESS: 73 y.o. female with a past medical history of hypothyroidism, vitamin D  def, chol, anxiety, chronic pain syndrome, GERD and others listed below presents for evaluation of GERD.   History of Present Illness   Monique Thomas is a 73 year old female with a history of hiatal hernia and hemorrhoids who presents with gastrointestinal symptoms and back pain.  She experiences significant discomfort from her hiatal hernia, which causes nausea and occasional vomiting sensations. She has been inconsistent with her reflux medication regimen and has not consulted a surgeon for this issue.  She describes left lower quadrant pain radiating down her leg during bowel movements, characterized as a nerve-like quiver. She has a history of constipation, attributed to her thyroid  condition, and manages it with Colace every other day to prevent loose stools. Despite complete bowel movements, she is concerned about potential blockages. No blood in stool, fever, chills, or vomiting, but she does experience nausea.  She has hemorrhoids that developed post-surgery, causing discomfort, especially when sitting or wiping. There is no bleeding, but she finds them embarrassing and notes they sometimes protrude more than usual.  She reports persistent back pain despite previous surgical intervention. She is not currently  undergoing physical therapy for her back, as she feels it has not been effective in the past. She  has not experienced any urinary incontinence or urinary issues.     Labs reviewed show no anemia, no leukocytosis, 08/2022 iron and ferritin normal, percent saturation iron low.  Normal B12.   She  reports that she has never smoked. She has never used smokeless tobacco. She reports that she does not drink alcohol and does not use drugs.  RELEVANT LABS AND IMAGING: CBC    Component Value Date/Time   WBC 4.2 10/13/2023 0900   RBC 4.04 10/13/2023 0900   HGB 12.5 10/13/2023 0900   HCT 37.0 10/13/2023 0900   PLT 349 10/13/2023 0900   MCV 91.6 10/13/2023 0900   MCH 30.9 10/13/2023 0900   MCHC 33.8 10/13/2023 0900   RDW 12.9 10/13/2023 0900   LYMPHSABS 1,596 05/30/2023 1116   MONOABS 0.2 03/29/2017 1025   EOSABS 184 05/30/2023 1116   BASOSABS 51 05/30/2023 1116   Recent Labs    05/30/23 1116 10/13/23 0900  HGB 12.9 12.5    CMP     Component Value Date/Time   NA 140 10/13/2023 0900   K 3.8 10/13/2023 0900   CL 108 10/13/2023 0900   CO2 21 (L) 10/13/2023 0900   GLUCOSE 109 (H) 10/13/2023 0900   BUN 11 10/13/2023 0900   CREATININE 0.76 10/13/2023 0900   CREATININE 0.74 05/30/2023 1116   CALCIUM  9.0 10/13/2023 0900   PROT 7.2 05/30/2023 1116   ALBUMIN 4.4 10/31/2016 1455   AST 17 05/30/2023 1116   ALT 10 05/30/2023 1116   ALKPHOS 53 10/31/2016 1455   BILITOT 0.5 05/30/2023 1116   GFRNONAA >60 10/13/2023 0900   GFRNONAA 91 01/28/2021 0931   GFRAA 105 01/28/2021 0931      Latest Ref Rng & Units 05/30/2023   11:16 AM 02/28/2023   10:37 AM 11/23/2022    4:25 PM  Hepatic Function  Total Protein 6.1 - 8.1 g/dL 7.2  6.5  6.8   AST 10 - 35 U/L 17  17  18    ALT 6 - 29 U/L 10  12  12    Total Bilirubin 0.2 - 1.2 mg/dL 0.5  0.5  0.4       Current Medications:   Current Outpatient Medications (Endocrine & Metabolic):    estradiol  (ESTRACE ) 1 MG tablet, Take 1 mg by mouth at  bedtime.   levothyroxine  (SYNTHROID ) 88 MCG tablet, Take 1 tablet daily except 1/2 tab on Tues/Sat for thyroid  on empty stomach with water  only. (Patient taking differently: Take 1 tablet daily except 1/2 tab on Tues/Thurs for thyroid  on empty stomach with water  only.)    Current Outpatient Medications (Analgesics):    naproxen  sodium (ALEVE ) 220 MG tablet, Take 2 tabs twice daily with food as needed for back pain.  Current Outpatient Medications (Hematological):    cyanocobalamin  (VITAMIN B12) 1000 MCG tablet, Take 1,000 mcg by mouth daily as needed (energy).  Current Outpatient Medications (Other):    hydrocortisone (ANUSOL-HC) 2.5 % rectal cream, Place 1 Application rectally 2 (two) times daily.   hydrocortisone (ANUSOL-HC) 25 MG suppository, Place 1 suppository (25 mg total) rectally 2 (two) times daily.   ALPRAZolam  (XANAX ) 1 MG tablet, TAKE 1/2 TO 1 TABLET BY MOUTH EVERY NIGHT AT BEDTIME. LIMIT TO 5 DAYS PER WEEK TO AVOID ADDICTION   cholecalciferol  (VITAMIN D3) 25 MCG (1000 UNIT) tablet, Take 2,000 Units by mouth daily.   cyclobenzaprine  (FLEXERIL ) 5 MG tablet, Take 1 tablet (5 mg total) by mouth 3 (three) times daily as needed for muscle spasms.  FIBER GUMMIES PO, Take 1 capsule by mouth at bedtime.   lidocaine  (LIDODERM ) 5 %, Place 1 patch onto the skin daily.   magnesium  gluconate (MAGONATE) 500 MG tablet, Take 500 mg by mouth at bedtime.   pantoprazole  (PROTONIX ) 40 MG tablet, TAKE 1 TABLET(40 MG) BY MOUTH DAILY (Patient taking differently: Take 40 mg by mouth daily as needed (heartburn).)   TOPAMAX  25 MG tablet, Take 25 mg by mouth at bedtime.   Trospium  Chloride 60 MG CP24, Take 1 capsule (60 mg total) by mouth daily.  Medical History:  Past Medical History:  Diagnosis Date   Anxiety    Arthritis    Back pain, chronic    Depression    GERD (gastroesophageal reflux disease)    History of kidney stones 2015   Hyperlipidemia    Hypothyroidism    Skin abnormality  12/07/2021   face red with 2 blisters intact on forehead due to [place]  crean tx for skin cancer   Skin Cancer (HCC)    SUI (stress urinary incontinence, female)    wears pads   Thyroid  disease    TMJ (temporomandibular joint disorder)    joint has dislocated x 3 in past   Allergies:  Allergies  Allergen Reactions   Celebrex [Celecoxib] Other (See Comments)    panic attack   Other Other (See Comments)    Skin bruises very easily and peels back   Gabapentin      sedation    Lyrica  [Pregabalin ]     sedation   Zofran  [Ondansetron ] Other (See Comments)    Headache   Entex Lq [Phenylephrine -Guaifenesin] Rash   Fiorinal [Butalbital-Aspirin -Caffeine] Rash     Surgical History:  She  has a past surgical history that includes Abdominal hysterectomy; Cesarean section; Hernia repair; Appendectomy; Tonsillectomy and adenoidectomy; Spine surgery; Laparoscopic lysis intestinal adhesions; Maximum access (mas)posterior lumbar interbody fusion (plif) 1 level (N/A, 04/16/2015); Anterior lat lumbar fusion (N/A, 04/07/2017); Total knee arthroplasty (Left, 07/17/2018); Extracorporeal shock wave lithotripsy (2015); Bladder suspension (2013); Bladder suspension (N/A, 12/09/2021); Cystoscopy (N/A, 12/09/2021); and Excision of mesh (N/A, 03/14/2022). Family History:  Her family history includes Asthma in her sister; Cancer in her maternal grandmother; Heart disease in her mother; Hyperlipidemia in her mother; Thyroid  disease in her mother.  REVIEW OF SYSTEMS  : All other systems reviewed and negative except where noted in the History of Present Illness.  PHYSICAL EXAM: BP 104/68   Pulse 66   Ht 5' (1.524 m)   Wt 136 lb 12.8 oz (62.1 kg)   SpO2 95%   BMI 26.72 kg/m  Physical Exam   GENERAL APPEARANCE: Well nourished, in no apparent distress. HEENT: No cervical lymphadenopathy, unremarkable thyroid , sclerae anicteric, conjunctiva pink. RESPIRATORY: Respiratory effort normal, breath sounds equal  bilaterally without rales, rhonchi, or wheezing. CARDIO: Regular rate and rhythm with no murmurs, rubs, or gallops, peripheral pulses intact. ABDOMEN: Soft, non-distended, decreased bowel sounds, no tenderness to palpation, no rebound, no mass appreciated. RECTAL: Hemorrhoidal skin tag present, prolapsed internal hemorrhoid observed, large internal hemorrhoid present, rectal exam negative for blood. MUSCULOSKELETAL: Full range of motion, normal gait, without edema. SKIN: Dry, intact without rashes or lesions. No jaundice. NEURO: Alert, oriented, no focal deficits. PSYCH: Cooperative, normal mood and affect.        Edmonia Gottron, PA-C 11:23 AM

## 2024-03-06 ENCOUNTER — Encounter: Payer: Self-pay | Admitting: Physician Assistant

## 2024-03-06 ENCOUNTER — Ambulatory Visit (INDEPENDENT_AMBULATORY_CARE_PROVIDER_SITE_OTHER)
Admission: RE | Admit: 2024-03-06 | Discharge: 2024-03-06 | Disposition: A | Discharge status: Home / Self Care | Source: Ambulatory Visit | Attending: Physician Assistant | Admitting: Physician Assistant

## 2024-03-06 ENCOUNTER — Ambulatory Visit: Admitting: Physician Assistant

## 2024-03-06 ENCOUNTER — Other Ambulatory Visit (INDEPENDENT_AMBULATORY_CARE_PROVIDER_SITE_OTHER)

## 2024-03-06 VITALS — BP 104/68 | HR 66 | Ht 60.0 in | Wt 136.8 lb

## 2024-03-06 DIAGNOSIS — K5904 Chronic idiopathic constipation: Secondary | ICD-10-CM | POA: Diagnosis not present

## 2024-03-06 DIAGNOSIS — K219 Gastro-esophageal reflux disease without esophagitis: Secondary | ICD-10-CM | POA: Diagnosis not present

## 2024-03-06 DIAGNOSIS — K649 Unspecified hemorrhoids: Secondary | ICD-10-CM

## 2024-03-06 DIAGNOSIS — K449 Diaphragmatic hernia without obstruction or gangrene: Secondary | ICD-10-CM

## 2024-03-06 DIAGNOSIS — N3941 Urge incontinence: Secondary | ICD-10-CM

## 2024-03-06 DIAGNOSIS — K2970 Gastritis, unspecified, without bleeding: Secondary | ICD-10-CM | POA: Diagnosis not present

## 2024-03-06 DIAGNOSIS — K648 Other hemorrhoids: Secondary | ICD-10-CM | POA: Diagnosis not present

## 2024-03-06 DIAGNOSIS — R1032 Left lower quadrant pain: Secondary | ICD-10-CM

## 2024-03-06 DIAGNOSIS — D649 Anemia, unspecified: Secondary | ICD-10-CM

## 2024-03-06 LAB — CBC WITH DIFFERENTIAL/PLATELET
Basophils Absolute: 0 10*3/uL (ref 0.0–0.1)
Basophils Relative: 0.9 % (ref 0.0–3.0)
Eosinophils Absolute: 0.3 10*3/uL (ref 0.0–0.7)
Eosinophils Relative: 5.6 % — ABNORMAL HIGH (ref 0.0–5.0)
HCT: 35.8 % — ABNORMAL LOW (ref 36.0–46.0)
Hemoglobin: 12.2 g/dL (ref 12.0–15.0)
Lymphocytes Relative: 35.1 % (ref 12.0–46.0)
Lymphs Abs: 1.8 10*3/uL (ref 0.7–4.0)
MCHC: 34.1 g/dL (ref 30.0–36.0)
MCV: 85.6 fl (ref 78.0–100.0)
Monocytes Absolute: 0.5 10*3/uL (ref 0.1–1.0)
Monocytes Relative: 8.7 % (ref 3.0–12.0)
Neutro Abs: 2.6 10*3/uL (ref 1.4–7.7)
Neutrophils Relative %: 49.7 % (ref 43.0–77.0)
Platelets: 373 10*3/uL (ref 150.0–400.0)
RBC: 4.18 Mil/uL (ref 3.87–5.11)
RDW: 14.5 % (ref 11.5–15.5)
WBC: 5.2 10*3/uL (ref 4.0–10.5)

## 2024-03-06 LAB — COMPREHENSIVE METABOLIC PANEL WITH GFR
ALT: 10 U/L (ref 0–35)
AST: 15 U/L (ref 0–37)
Albumin: 4.2 g/dL (ref 3.5–5.2)
Alkaline Phosphatase: 54 U/L (ref 39–117)
BUN: 18 mg/dL (ref 6–23)
CO2: 27 meq/L (ref 19–32)
Calcium: 9.4 mg/dL (ref 8.4–10.5)
Chloride: 108 meq/L (ref 96–112)
Creatinine, Ser: 0.66 mg/dL (ref 0.40–1.20)
GFR: 87.18 mL/min (ref >60.00)
Glucose, Bld: 93 mg/dL (ref 70–99)
Potassium: 4.4 meq/L (ref 3.5–5.1)
Sodium: 140 meq/L (ref 135–145)
Total Bilirubin: 0.4 mg/dL (ref 0.2–1.2)
Total Protein: 6.9 g/dL (ref 6.0–8.3)

## 2024-03-06 LAB — SEDIMENTATION RATE: Sed Rate: 13 mm/h (ref 0–30)

## 2024-03-06 MED ORDER — HYDROCORTISONE ACETATE 25 MG RE SUPP: 25.0000 mg | Freq: Two times a day (BID) | RECTAL | 0 refills | Status: AC

## 2024-03-06 MED ORDER — HYDROCORTISONE (PERIANAL) 2.5 % EX CREA: 1.0000 | TOPICAL_CREAM | Freq: Two times a day (BID) | CUTANEOUS | 2 refills | Status: AC

## 2024-03-06 NOTE — Patient Instructions (Addendum)
 Your provider has requested that you go to the basement level for lab work before leaving today. Press "B" on the elevator. The lab is located at the first door on the left as you exit the elevator.  Your provider has requested that you have an abdominal x ray before leaving today. Please go to the basement floor to our Radiology department for the test.  Please do sitz baths- these can be found at the pharmacy. It is a Chief Operating Officer that is put in your toliet.  Please increase fiber or add benefiber, increase water and increase acitivity.  Will send in hydrocoritsone suppository, cheapest with GOODRX from [employer], [employer], [employer] or [employer] if your insurance does not pay for it. If the hemorrhoid suppository sent in is too expensive you can do this over the counter trick.  Apply a pea size amount of generic prescription Anusol HC cream that has been sent into your pharmacy to the tip of an over the counter PrepH suppository and insert rectally once every night for at least 7 nights.  If this does not improve there are procedures that can be done.   About Hemorrhoids  Hemorrhoids are swollen veins in the lower rectum and anus.  Also called piles, hemorrhoids are a common problem.  Hemorrhoids may be internal (inside the rectum) or external (around the anus).  Internal Hemorrhoids  Internal hemorrhoids are often painless, but they rarely cause bleeding.  The internal veins may stretch and fall down (prolapse) through the anus to the outside of the body.  The veins may then become irritated and painful.  External Hemorrhoids  External hemorrhoids can be easily seen or felt around the anal opening.  They are under the skin around the anus.  When the swollen veins are scratched or broken by straining, rubbing or wiping they sometimes bleed.  How Hemorrhoids Occur  Veins in the rectum and around the anus tend to swell under pressure.  Hemorrhoids can result from increased pressure in the  veins of your anus or rectum.  Some sources of pressure are:  Straining to have a bowel movement because of constipation Waiting too long to have a bowel movement Coughing and sneezing often Sitting for extended periods of time, including on the toilet Diarrhea Obesity Trauma or injury to the anus Some liver diseases Stress Family history of hemorrhoids Pregnancy  Pregnant women should try to avoid becoming constipated, because they are more likely to have hemorrhoids during pregnancy.  In the last trimester of pregnancy, the enlarged uterus may press on blood vessels and causes hemorrhoids.  In addition, the strain of childbirth sometimes causes hemorrhoids after the birth.  Symptoms of Hemorrhoids  Some symptoms of hemorrhoids include: Swelling and/or a tender lump around the anus Itching, mild burning and bleeding around the anus Painful bowel movements with or without constipation Bright red blood covering the stool, on toilet paper or in the toilet bowel.   Symptoms usually go away within a few days.  Always talk to your doctor about any bleeding to make sure it is not from some other causes.  Diagnosing and Treating Hemorrhoids  Diagnosis is made by an examination by your healthcare provider.  Special test can be performed by your doctor.    Most cases of hemorrhoids can be treated with: High-fiber diet: Eat more high-fiber foods, which help prevent constipation.  Ask for more detailed fiber information on types and sources of fiber from your healthcare provider. Fluids: Drink plenty of water.  This helps soften bowel movements so they are easier to pass. Sitz baths and cold packs: Sitting in lukewarm water two or three times a day for 15 minutes cleases the anal area and may relieve discomfort.  If the water is too hot, swelling around the anus will get worse.  Placing a cloth-covered ice pack on the anus for ten minutes four times a day can also help reduce selling.  Gently  pushing a prolapsed hemorrhoid back inside after the bath or ice pack can be helpful. Medications: For mild discomfort, your healthcare provider may suggest over-the-counter pain medication or prescribe a cream or ointment for topical use.  The cream may contain witch hazel, zinc oxide or petroleum jelly.  Medicated suppositories are also a treatment option.  Always consult your doctor before applying medications or creams. Procedures and surgeries: There are also a number of procedures and surgeries to shrink or remove hemorrhoids in more serious cases.  Talk to your physician about these options.  You can often prevent hemorrhoids or keep them from becoming worse by maintaining a healthy lifestyle.  Eat a fiber-rich diet of fruits, vegetables and whole grains.  Also, drink plenty of water and exercise regularly.   2007, [employer] Doc.30   Here some information about pelvic floor dysfunction. This may be contributing to some of your symptoms. We will continue with our evaluation but I do want you to consider adding on fiber supplement with low-dose MiraLAX daily. We could also refer to pelvic floor physical therapy.   Pelvic Floor Dysfunction, Female Pelvic floor dysfunction (PFD) is a condition that results when the group of muscles and connective tissues that support the organs in the pelvis (pelvic floor muscles) do not work well. These muscles and their connections form a sling that supports the colon and bladder. In women, they also support the uterus. PFD causes pelvic floor muscles to be too weak, too tight, or both. In PFD, muscle movements are not coordinated. This may cause bowel or bladder problems. It may also cause pain. What are the causes? This condition may be caused by an injury to the pelvic area or by a weakening of pelvic muscles. This often results from pregnancy and childbirth or other types of strain. In many cases, the exact cause is not known. What  increases the risk? The following factors may make you more likely to develop this condition: Having chronic bladder tissue inflammation (interstitial cystitis). Being an older person. Being overweight. History of radiation treatment for cancer in the pelvic region. Previous pelvic surgery, such as removal of the uterus (hysterectomy). What are the signs or symptoms? Symptoms of this condition vary and may include: Bladder symptoms, such as: Trouble starting urination and emptying the bladder. Frequent urinary tract infections. Leaking urine when coughing, laughing, or exercising (stress incontinence). Having to pass urine urgently or frequently. Pain when passing urine. Bowel symptoms, such as: Constipation. Urgent or frequent bowel movements. Incomplete bowel movements. Painful bowel movements. Leaking stool or gas. Unexplained genital or rectal pain. Genital or rectal muscle spasms. Low back pain. Other symptoms may include: A heavy, full, or aching feeling in the vagina. A bulge that protrudes into the vagina. Pain during or after sex. How is this diagnosed? This condition may be diagnosed based on: Your symptoms and medical history. A physical exam. During the exam, your health care provider may check your pelvic muscles for tightness, spasm, pain, or weakness. This may include a rectal exam and a pelvic exam. In  some cases, you may have diagnostic tests, such as: Electrical muscle function tests. Urine flow testing. X-ray tests of bowel function. Ultrasound of the pelvic organs. How is this treated? Treatment for this condition depends on the symptoms. Treatment options include: Physical therapy. This may include Kegel exercises to help relax or strengthen the pelvic floor muscles. Biofeedback. This type of therapy provides feedback on how tight your pelvic floor muscles are so that you can learn to control them. Internal or external massage therapy. A treatment that  involves electrical stimulation of the pelvic floor muscles to help control pain (transcutaneous electrical nerve stimulation, or TENS). Sound wave therapy (ultrasound) to reduce muscle spasms. Medicines, such as: Muscle relaxants. Bladder control medicines. Surgery to reconstruct or support pelvic floor muscles may be an option if other treatments do not help. Follow these instructions at home: Activity Do your usual activities as told by your health care provider. Ask your health care provider if you should modify any activities. Do pelvic floor strengthening or relaxing exercises at home as told by your physical therapist. Lifestyle Maintain a healthy weight. Eat foods that are high in fiber, such as beans, whole grains, and fresh fruits and vegetables. Limit foods that are high in fat and processed sugars, such as fried or sweet foods. Manage stress with relaxation techniques such as yoga or meditation. General instructions If you have problems with leakage: Use absorbable pads or wear padded underwear. Wash frequently with mild soap. Keep your genital and anal area as clean and dry as possible. Ask your health care provider if you should try a barrier cream to prevent skin irritation. Take warm baths to relieve pelvic muscle tension or spasms. Take over-the-counter and prescription medicines only as told by your health care provider. Keep all follow-up visits. How is this prevented? The cause of PFD is not always known, but there are a few things you can do to reduce the risk of developing this condition, including: Staying at a healthy weight. Getting regular exercise. Managing stress. Contact a health care provider if: Your symptoms are not improving with home care. You have signs or symptoms of PFD that get worse at home. You develop new signs or symptoms. You have signs of a urinary tract infection, such as: Fever. Chills. Increased urinary frequency. A burning feeling  when urinating. You have not had a bowel movement in 3 days (constipation). Summary Pelvic floor dysfunction results when the muscles and connective tissues in your pelvic floor do not work well. These muscles and their connections form a sling that supports your colon and bladder. In women, they also support the uterus. PFD may be caused by an injury to the pelvic area or by a weakening of pelvic muscles. PFD causes pelvic floor muscles to be too weak, too tight, or a combination of both. Symptoms may vary from person to person. In most cases, PFD can be treated with physical therapies and medicines. Surgery may be an option if other treatments do not help. This information is not intended to replace advice given to you by your health care provider. Make sure you discuss any questions you have with your health care provider. Document Revised: 01/13/2021 Document Reviewed: 01/13/2021 Elsevier Patient Education  2022 [employer].

## 2024-03-07 ENCOUNTER — Ambulatory Visit: Payer: Self-pay | Admitting: Physician Assistant

## 2024-03-08 ENCOUNTER — Other Ambulatory Visit: Payer: Self-pay

## 2024-03-08 DIAGNOSIS — K2100 Gastro-esophageal reflux disease with esophagitis, without bleeding: Secondary | ICD-10-CM

## 2024-03-08 MED ORDER — PANTOPRAZOLE SODIUM 40 MG PO TBEC: 40.0000 mg | DELAYED_RELEASE_TABLET | Freq: Every day | ORAL | 0 refills | Status: DC

## 2024-04-05 ENCOUNTER — Ambulatory Visit: Admitting: Obstetrics and Gynecology

## 2024-04-24 ENCOUNTER — Encounter: Payer: Self-pay | Admitting: Gastroenterology

## 2024-04-24 ENCOUNTER — Ambulatory Visit: Admitting: Gastroenterology

## 2024-04-24 VITALS — BP 100/70 | HR 61 | Ht 60.0 in | Wt 137.0 lb

## 2024-04-24 DIAGNOSIS — K648 Other hemorrhoids: Secondary | ICD-10-CM

## 2024-04-24 DIAGNOSIS — K6289 Other specified diseases of anus and rectum: Secondary | ICD-10-CM | POA: Diagnosis not present

## 2024-04-24 DIAGNOSIS — K649 Unspecified hemorrhoids: Secondary | ICD-10-CM

## 2024-04-24 NOTE — Patient Instructions (Addendum)
 We have scheduled your first hemorrhoid banding with Dr. Avram on 05/14/24 at 2:50pm.  You can use the Calmol suppositories as needed.   _______________________________________________________  If your blood pressure at your visit was 140/90 or greater, please contact your primary care physician to follow up on this.  _______________________________________________________  If you are age 73 or older, your body mass index should be between 23-30. Your Body mass index is 26.76 kg/m. If this is out of the aforementioned range listed, please consider follow up with your Primary Care Provider.  If you are age 60 or younger, your body mass index should be between 19-25. Your Body mass index is 26.76 kg/m. If this is out of the aformentioned range listed, please consider follow up with your Primary Care Provider.   ________________________________________________________  The [facility] providers would like to encourage you to use MYCHART to communicate with providers for non-urgent requests or questions.  Due to long hold times on the telephone, sending your provider a message by [facility] may be a faster and more efficient way to get a response.  Please allow 48 business hours for a response.  Please remember that this is for non-urgent requests.  _______________________________________________________  [facility] is using a team-based approach to care.  Your team is made up of your doctor and two to three APPS. Our APPS (Nurse Practitioners and Physician Assistants) work with your physician to ensure care continuity for you. They are fully qualified to address your health concerns and develop a treatment plan. They communicate directly with your gastroenterologist to care for you. Seeing the Advanced Practice Practitioners on your physician's team can help you by facilitating care more promptly, often allowing for earlier appointments, access to diagnostic testing, procedures, and  other specialty referrals.

## 2024-04-24 NOTE — Progress Notes (Unsigned)
 [name] [id number] Sep 10, 1951   Chief Complaint: Hemorrhoids  Referring Provider: Tonita Fallow, MD Primary GI MD: Dr. Avram  HPI: Monique Thomas is a 73 y.o. female with past medical history of anxiety, arthritis, depression, GERD, kidney stones, HLD, hypothyroidism, skin cancer, TMJ disorder, stress urinary incontinence s/p bladder suspension, appendectomy, hysterectomy, umbilical hernia repair who presents today for a complaint of hemorrhoids.    Patient last seen in office 03/06/2024 by Alan Coombs, PA-C for evaluation of GERD.  She has history of hiatal hernia which causes significant discomfort, nausea.  Noted to have been inconsistent with reflux medications.  Discussed potential surgical evaluation if symptoms worsen. Patient also reported LLQ pain with history of chronic idiopathic constipation.  Advised to increase fiber/water  intake, decrease caffeine, increase activity level.  Also referred to pelvic floor physical therapy, and KUB ordered which was unremarkable. Noted to have a prolapsed internal hemorrhoid on exam which had been causing pain and was prescribed hydrocortisone  suppositories with consideration for banding if ineffective.   Patient states her symptoms did not improve with Anusol  suppositories.  She continues to have pain from hemorrhoids.  States it can sometimes feel like nerve pain, and can experience worsening rectal pain with certain movements/position changes.  Reports she has had hemorrhoid banding in the past, was done many years ago.  She has fairly regular bowel movements.  Had back surgery in January and due to pain medications became constipated.  She feels that this is when her hemorrhoids started to flareup.  She has been taking magnesium  daily.  Occasionally may have some diarrhea or fecal urgency but denies any fecal incontinence.  Denies rectal bleeding.  Previous GI Procedures/Imaging   Colonoscopy 02/09/2023 - Diverticulosis in the  sigmoid colon.  - The examination was otherwise normal on direct and retroflexion views.  - No specimens collected. No cause of iron deficinecy seen - No recall due to age and no need for further Cologuard or Hemoccults  EGD 02/09/2023 - Tortuous esophagus.  - 6 cm hiatal hernia.  - Gastritis. Biopsied. Antrum and Body/ Fundus - ant/ post, lesser/ greater curves in both ( and incisura)  - Gastroesophageal flap valve classified as Hill Grade IV ( no fold, wide open lumen, hiatal hernia present) .  - Normal examined duodenum.   Past Medical History:  Diagnosis Date   Anxiety    Arthritis    Back pain, chronic    Depression    GERD (gastroesophageal reflux disease)    History of kidney stones 2015   Hyperlipidemia    Hypothyroidism    Skin abnormality 12/07/2021   face red with 2 blisters intact on forehead due to [place]  crean tx for skin cancer   Skin Cancer (HCC)    SUI (stress urinary incontinence, female)    wears pads   Thyroid  disease    TMJ (temporomandibular joint disorder)    joint has dislocated x 3 in past    Past Surgical History:  Procedure Laterality Date   ABDOMINAL HYSTERECTOMY     age 43, 1 ovary reved at surgery 1 ovary removed later   ANTERIOR LAT LUMBAR FUSION N/A 04/07/2017   Procedure: LUMBAR ONE-TWO ANTERIOR LATERAL LUMBAR FUSION WITH LATERAL PLATE;  Surgeon: Joshua Alm RAMAN, MD;  Location: [facility] OR;  Service: Neurosurgery;  Laterality: N/A;  Right side approach   APPENDECTOMY     age 6   BLADDER SUSPENSION  2013   BLADDER SUSPENSION N/A 12/09/2021   Procedure: TRANSVAGINAL TAPE (TVT)  PROCEDURE;  Surgeon: Marilynne Rosaline SAILOR, MD;  Location: [facility];  Service: Gynecology;  Laterality: N/A;   CESAREAN SECTION     x 2   CYSTOSCOPY N/A 12/09/2021   Procedure: CYSTOSCOPY;  Surgeon: Marilynne Rosaline SAILOR, MD;  Location: [facility];  Service: Gynecology;  Laterality: N/A;   EXCISION OF MESH N/A 03/14/2022   Procedure: VALORIE  REVISION;  Surgeon: Marilynne Rosaline SAILOR, MD;  Location: [facility];  Service: Gynecology;  Laterality: N/A;  total time requested is 1 hour   EXTRACORPOREAL SHOCK WAVE LITHOTRIPSY  2015   HERNIA REPAIR     umbilical yrs ago   LAPAROSCOPIC LYSIS INTESTINAL ADHESIONS     yrs ago   MAXIMUM ACCESS (MAS)POSTERIOR LUMBAR INTERBODY FUSION (PLIF) 1 LEVEL N/A 04/16/2015   Procedure: masPLIF  - L2-L3 ;  Surgeon: Alm GORMAN Molt, MD;  Location: [facility];  Service: Neurosurgery;  Laterality: N/A;  masPLIF  - L2-L3    SPINE SURGERY     lumbar before 2016 surgery   TONSILLECTOMY AND ADENOIDECTOMY     age 36   TOTAL KNEE ARTHROPLASTY Left 07/17/2018   Procedure: LEFT TOTAL KNEE ARTHROPLASTY;  Surgeon: Ernie Cough, MD;  Location: [facility];  Service: Orthopedics;  Laterality: Left;  70 mins    Current Outpatient Medications  Medication Sig Dispense Refill   ALPRAZolam  (XANAX ) 1 MG tablet TAKE 1/2 TO 1 TABLET BY MOUTH EVERY NIGHT AT BEDTIME. LIMIT TO 5 DAYS PER WEEK TO AVOID ADDICTION 30 tablet 0   cholecalciferol  (VITAMIN D3) 25 MCG (1000 UNIT) tablet Take 2,000 Units by mouth daily.     cyanocobalamin  (VITAMIN B12) 1000 MCG tablet Take 1,000 mcg by mouth daily as needed (energy).     cyclobenzaprine  (FLEXERIL ) 5 MG tablet Take 1 tablet (5 mg total) by mouth 3 (three) times daily as needed for muscle spasms. 60 tablet 2   estradiol  (ESTRACE ) 1 MG tablet Take 1 mg by mouth at bedtime.     FIBER GUMMIES PO Take 1 capsule by mouth at bedtime.     hydrocortisone  (ANUSOL -HC) 2.5 % rectal cream Place 1 Application rectally 2 (two) times daily. 30 g 2   hydrocortisone  (ANUSOL -HC) 25 MG suppository Place 1 suppository (25 mg total) rectally 2 (two) times daily. 12 suppository 0   levothyroxine  (SYNTHROID ) 88 MCG tablet Take 1 tablet daily except 1/2 tab on Tues/Sat for thyroid  on empty stomach with water  only. (Patient taking differently: Take 1 tablet daily except 1/2 tab on Tues/Thurs for  thyroid  on empty stomach with water  only.) 30 tablet 11   lidocaine  (LIDODERM ) 5 % Place 1 patch onto the skin daily.     magnesium  gluconate (MAGONATE) 500 MG tablet Take 500 mg by mouth at bedtime.     naproxen  sodium (ALEVE ) 220 MG tablet Take 2 tabs twice daily with food as needed for back pain.     pantoprazole  (PROTONIX ) 40 MG tablet Take 1 tablet (40 mg total) by mouth daily. TAKE 1 TABLET(40 MG) BY MOUTH DAILY 90 tablet 0   TOPAMAX  25 MG tablet Take 25 mg by mouth at bedtime.     Trospium  Chloride 60 MG CP24 Take 1 capsule (60 mg total) by mouth daily. 30 capsule 11   No current facility-administered medications for this visit.    Allergies as of 04/24/2024 - Review Complete 03/06/2024  Allergen Reaction Noted   Celebrex [celecoxib] Other (See Comments) 10/07/2014   Other Other (See Comments) 04/06/2015  Gabapentin   08/22/2022   Lyrica  [pregabalin ]  08/22/2022   Zofran  [ondansetron ] Other (See Comments) 07/27/2021   Entex lq [phenylephrine -guaifenesin] Rash 09/15/2013   Fiorinal [butalbital-aspirin -caffeine] Rash 09/15/2013    Family History  Problem Relation Age of Onset   Heart disease Mother    Hyperlipidemia Mother    Thyroid  disease Mother    Asthma Sister    Cancer Maternal Grandmother        ovarian   Colon cancer Neg Hx    Rectal cancer Neg Hx    Stomach cancer Neg Hx    Diabetes Neg Hx     Social History   Tobacco Use   Smoking status: Never   Smokeless tobacco: Never  Vaping Use   Vaping status: Never Used  Substance Use Topics   Alcohol use: No   Drug use: No     Review of Systems:    Constitutional: No fever, chills Cardiovascular: No chest pain  Respiratory: No SOB Gastrointestinal: See HPI and otherwise negative Hematologic: No bleeding or bruising    Physical Exam:  Vital signs: BP 100/70   Pulse 61   Ht 5' (1.524 m)   Wt 137 lb (62.1 kg)   BMI 26.76 kg/m    Constitutional: Pleasant female in NAD, alert and cooperative Head:   Normocephalic and atraumatic.  Eyes: No scleral icterus.  Respiratory: Respirations even and unlabored. Lungs clear to auscultation bilaterally.  No wheezes, crackles, or rhonchi.  Cardiovascular:  Regular rate and rhythm. No murmurs. No peripheral edema. Gastrointestinal:  Soft, nondistended, nontender. No rebound or guarding. Normal bowel sounds. No appreciable masses or hepatomegaly. Rectal: Prolapsed internal hemorrhoids seen on DRE and anoscopy.  No visible bleeding. Neurologic:  Alert and oriented x4;  grossly normal neurologically.  Skin:   Dry and intact without significant lesions or rashes. Psychiatric: Oriented to person, place and time. Demonstrates good judgement and reason without abnormal affect or behaviors.   RELEVANT LABS AND IMAGING: CBC    Component Value Date/Time   WBC 5.2 03/06/2024 1117   RBC 4.18 03/06/2024 1117   HGB 12.2 03/06/2024 1117   HCT 35.8 (L) 03/06/2024 1117   PLT 373.0 03/06/2024 1117   MCV 85.6 03/06/2024 1117   MCH 30.9 10/13/2023 0900   MCHC 34.1 03/06/2024 1117   RDW 14.5 03/06/2024 1117   LYMPHSABS 1.8 03/06/2024 1117   MONOABS 0.5 03/06/2024 1117   EOSABS 0.3 03/06/2024 1117   BASOSABS 0.0 03/06/2024 1117    CMP     Component Value Date/Time   NA 140 03/06/2024 1117   K 4.4 03/06/2024 1117   CL 108 03/06/2024 1117   CO2 27 03/06/2024 1117   GLUCOSE 93 03/06/2024 1117   BUN 18 03/06/2024 1117   CREATININE 0.66 03/06/2024 1117   CREATININE 0.74 05/30/2023 1116   CALCIUM  9.4 03/06/2024 1117   PROT 6.9 03/06/2024 1117   ALBUMIN 4.2 03/06/2024 1117   AST 15 03/06/2024 1117   ALT 10 03/06/2024 1117   ALKPHOS 54 03/06/2024 1117   BILITOT 0.4 03/06/2024 1117   GFRNONAA >60 10/13/2023 0900   GFRNONAA 91 01/28/2021 0931   GFRAA 105 01/28/2021 0931     Assessment/Plan:   Hemorrhoids Rectal pain  Patient seen today for follow-up of internal hemorrhoids.  Seen in office 03/06/2024 and treated with Anusol  suppositories.  Patient  states these were not helpful and she continues to have pain/discomfort from hemorrhoids.  Prolapsed internal hemorrhoid seen on DRE and anoscopy today.  She does report  history of hemorrhoid banding many years ago.  We discussed continued conservative management versus hemorrhoid banding, and she would like to schedule banding procedure.  - Schedule hemorrhoid banding procedure - Calmol suppository samples given   Camie Furbish, PA-C [facility] 04/24/2024, 7:42 AM  Patient Care Team: Tonita Fallow, MD as PCP - General (Internal Medicine) Zulema Gear, MD as Referring Physician (Gastroenterology) Rox Charleston, MD as Consulting Physician (Obstetrics and Gynecology) Chales Idol, MD as Consulting Physician (Urology) Joshua Darold RIGGERS as Physician Assistant (Orthopedic Surgery) Livingston Rigg, MD as Consulting Physician (Dermatology) Avram Lupita BRAVO, MD as Consulting Physician (Gastroenterology) Paul Barrio, OD as Referring Physician (Optometry) Marilynne Rosaline SAILOR, MD as Consulting Physician (Obstetrics and Gynecology)

## 2024-04-25 ENCOUNTER — Encounter: Payer: Self-pay | Admitting: Gastroenterology

## 2024-04-30 DIAGNOSIS — Z6826 Body mass index (BMI) 26.0-26.9, adult: Secondary | ICD-10-CM | POA: Diagnosis not present

## 2024-04-30 DIAGNOSIS — M4317 Spondylolisthesis, lumbosacral region: Secondary | ICD-10-CM | POA: Diagnosis not present

## 2024-05-09 ENCOUNTER — Ambulatory Visit

## 2024-05-14 ENCOUNTER — Ambulatory Visit: Admitting: Internal Medicine

## 2024-05-14 ENCOUNTER — Encounter: Payer: Self-pay | Admitting: Internal Medicine

## 2024-05-14 VITALS — BP 110/66 | HR 74 | Ht 60.0 in | Wt 137.0 lb

## 2024-05-14 DIAGNOSIS — K648 Other hemorrhoids: Secondary | ICD-10-CM | POA: Insufficient documentation

## 2024-05-14 DIAGNOSIS — K641 Second degree hemorrhoids: Secondary | ICD-10-CM | POA: Diagnosis not present

## 2024-05-14 NOTE — Progress Notes (Signed)
   HEMORRHOID LIGATION:  SIGNS AND SYMPTOMS: Prolapse and discomfort-constipation problems after back surgery earlier this year leading to hemorrhoid problems.  Seen in the clinic twice, did not respond to over-the-counter or hydrocortisone  topical agents.  Has discomfort with defecation and prolapse symptoms with spontaneous resolution of prolapsed anorectal tissue.  Patti [place], CMA present.  Rectal exam: There is a small external hemorrhoid in the left lateral position which is slightly tender to palpation.  Digital exam is nontender without mass there is no stool present.  Anoscopy: Grade 2 internal hemorrhoids left lateral and right posterior with a grade 1 right anterior.  PROCEDURE NOTE: The patient presents with symptomatic grade 2  hemorrhoids, requesting rubber band ligation of his/her hemorrhoidal disease.  All risks, benefits and alternative forms of therapy were described and informed consent was obtained.   The anorectum was pre-medicated with 0.125% nitroglycerin and 5% lidocaine  The decision was made to band the left lateral and right posterior internal hemorrhoid columns, and the [facility] O'Regan System was used to perform band ligation without complication.  Digital anorectal examination was then performed to assure proper positioning of the band, and to adjust the banded tissue as required.  The patient was discharged home without pain or other issues.  Dietary and behavioral recommendations were given and along with follow-up instructions.     The following adjunctive treatments were recommended:  Benefiber 1 tablespoon daily x 1 month  The patient will return as needed for  follow-up and possible additional banding as required. No complications were encountered and the patient tolerated the procedure well.

## 2024-05-14 NOTE — Patient Instructions (Signed)
 HEMORRHOID BANDING PROCEDURE    FOLLOW-UP CARE   The procedure you have had should have been relatively painless since the banding of the area involved does not have nerve endings and there is no pain sensation.  The rubber band cuts off the blood supply to the hemorrhoid and the band may fall off as soon as 48 hours after the banding (the band may occasionally be seen in the toilet bowl following a bowel movement). You may notice a temporary feeling of fullness in the rectum which should respond adequately to plain Tylenol  or Motrin .  Following the banding, avoid strenuous exercise that evening and resume full activity the next day.  A sitz bath (soaking in a warm tub) or bidet is soothing, and can be useful for cleansing the area after bowel movements.     To avoid constipation, take two tablespoons of natural wheat bran, natural oat bran, flax, Benefiber or any over the counter fiber supplement and increase your water  intake to 7-8 glasses daily.  DO THE BENEFIBER DAILY FOR A MONTH.  Unless you have been prescribed anorectal medication, do not put anything inside your rectum for two weeks: No suppositories, enemas, fingers, etc.  Occasionally, you may have more bleeding than usual after the banding procedure.  This is often from the untreated hemorrhoids rather than the treated one.  Don't be concerned if there is a tablespoon or so of blood.  If there is more blood than this, lie flat with your bottom higher than your head and apply an ice pack to the area. If the bleeding does not stop within a half an hour or if you feel faint, call our office at [phone] or go to the emergency room.  Problems are not common; however, if there is a substantial amount of bleeding, severe pain, chills, fever or difficulty passing urine (very rare) or other problems, you should call us  at [phone] or report to the nearest emergency room.  Do not stay seated continuously for more than 2-3 hours  for a day or two after the procedure.  Tighten your buttock muscles 10-15 times every two hours and take 10-15 deep breaths every 1-2 hours.  Do not spend more than a few minutes on the toilet if you cannot empty your bowel; instead re-visit the toilet at a later time.   I appreciate the opportunity to care for you. Lupita Commander, MD, [facility]

## 2024-05-30 ENCOUNTER — Encounter: Payer: Medicare PPO | Admitting: Nurse Practitioner

## 2024-06-06 ENCOUNTER — Other Ambulatory Visit: Payer: Self-pay | Admitting: Physician Assistant

## 2024-06-06 DIAGNOSIS — K2100 Gastro-esophageal reflux disease with esophagitis, without bleeding: Secondary | ICD-10-CM

## 2024-07-11 DIAGNOSIS — I10 Essential (primary) hypertension: Secondary | ICD-10-CM | POA: Diagnosis not present

## 2024-07-11 DIAGNOSIS — Z23 Encounter for immunization: Secondary | ICD-10-CM | POA: Diagnosis not present

## 2024-07-11 DIAGNOSIS — Z0000 Encounter for general adult medical examination without abnormal findings: Secondary | ICD-10-CM | POA: Diagnosis not present

## 2024-07-11 DIAGNOSIS — Z1331 Encounter for screening for depression: Secondary | ICD-10-CM | POA: Diagnosis not present

## 2024-07-11 DIAGNOSIS — L603 Nail dystrophy: Secondary | ICD-10-CM | POA: Diagnosis not present

## 2024-07-11 DIAGNOSIS — L659 Nonscarring hair loss, unspecified: Secondary | ICD-10-CM | POA: Diagnosis not present

## 2024-07-11 DIAGNOSIS — E785 Hyperlipidemia, unspecified: Secondary | ICD-10-CM | POA: Diagnosis not present

## 2024-07-11 DIAGNOSIS — R5383 Other fatigue: Secondary | ICD-10-CM | POA: Diagnosis not present

## 2024-07-11 DIAGNOSIS — F419 Anxiety disorder, unspecified: Secondary | ICD-10-CM | POA: Diagnosis not present

## 2024-07-11 DIAGNOSIS — E039 Hypothyroidism, unspecified: Secondary | ICD-10-CM | POA: Diagnosis not present

## 2024-08-19 DIAGNOSIS — R946 Abnormal results of thyroid function studies: Secondary | ICD-10-CM | POA: Diagnosis not present

## 2024-08-20 DIAGNOSIS — M25511 Pain in right shoulder: Secondary | ICD-10-CM | POA: Diagnosis not present

## 2024-08-20 DIAGNOSIS — M7551 Bursitis of right shoulder: Secondary | ICD-10-CM | POA: Diagnosis not present

## 2024-09-08 ENCOUNTER — Other Ambulatory Visit: Payer: Self-pay | Admitting: Physician Assistant

## 2024-09-08 DIAGNOSIS — K2100 Gastro-esophageal reflux disease with esophagitis, without bleeding: Secondary | ICD-10-CM

## 2024-09-25 ENCOUNTER — Ambulatory Visit: Payer: Medicare PPO | Admitting: Nurse Practitioner

## 2024-09-30 ENCOUNTER — Encounter: Payer: Self-pay | Admitting: *Deleted

## 2024-10-02 ENCOUNTER — Other Ambulatory Visit: Payer: Self-pay | Admitting: Obstetrics and Gynecology
